# Patient Record
Sex: Female | Born: 1970 | Race: Black or African American | Hispanic: No | Marital: Single | Smoking: Former smoker
Health system: Southern US, Community
[De-identification: ages and names within clinical notes are randomized; demographics above are authoritative.]

## PROBLEM LIST (undated history)

## (undated) DIAGNOSIS — I2510 Atherosclerotic heart disease of native coronary artery without angina pectoris: Secondary | ICD-10-CM

## (undated) DIAGNOSIS — I219 Acute myocardial infarction, unspecified: Secondary | ICD-10-CM

## (undated) DIAGNOSIS — I739 Peripheral vascular disease, unspecified: Secondary | ICD-10-CM

## (undated) DIAGNOSIS — E785 Hyperlipidemia, unspecified: Secondary | ICD-10-CM

## (undated) DIAGNOSIS — E119 Type 2 diabetes mellitus without complications: Secondary | ICD-10-CM

## (undated) DIAGNOSIS — I10 Essential (primary) hypertension: Secondary | ICD-10-CM

## (undated) DIAGNOSIS — Z9114 Patient's other noncompliance with medication regimen: Secondary | ICD-10-CM

## (undated) DIAGNOSIS — Z720 Tobacco use: Secondary | ICD-10-CM

## (undated) DIAGNOSIS — Z91148 Patient's other noncompliance with medication regimen for other reason: Secondary | ICD-10-CM

## (undated) DIAGNOSIS — I779 Disorder of arteries and arterioles, unspecified: Secondary | ICD-10-CM

## (undated) HISTORY — DX: Patient's other noncompliance with medication regimen for other reason: Z91.148

## (undated) HISTORY — DX: Tobacco use: Z72.0

## (undated) HISTORY — PX: ABDOMINAL HYSTERECTOMY: SHX81

## (undated) HISTORY — DX: Patient's other noncompliance with medication regimen: Z91.14

---

## 1997-06-20 ENCOUNTER — Encounter (HOSPITAL_COMMUNITY): Admission: RE | Admit: 1997-06-20 | Discharge: 1997-09-18 | Payer: Self-pay | Admitting: *Deleted

## 1997-07-27 ENCOUNTER — Inpatient Hospital Stay (HOSPITAL_COMMUNITY): Admission: AD | Admit: 1997-07-27 | Discharge: 1997-07-27 | Payer: Self-pay | Admitting: *Deleted

## 1997-10-20 ENCOUNTER — Inpatient Hospital Stay (HOSPITAL_COMMUNITY): Admission: AD | Admit: 1997-10-20 | Discharge: 1997-10-20 | Payer: Self-pay | Admitting: *Deleted

## 1997-10-26 ENCOUNTER — Encounter (HOSPITAL_COMMUNITY): Admission: RE | Admit: 1997-10-26 | Discharge: 1997-11-14 | Payer: Self-pay | Admitting: *Deleted

## 1997-11-15 ENCOUNTER — Inpatient Hospital Stay (HOSPITAL_COMMUNITY): Admission: AD | Admit: 1997-11-15 | Discharge: 1997-11-17 | Payer: Self-pay | Admitting: *Deleted

## 1998-06-25 ENCOUNTER — Ambulatory Visit (HOSPITAL_COMMUNITY): Admission: RE | Admit: 1998-06-25 | Discharge: 1998-06-25 | Payer: Self-pay | Admitting: *Deleted

## 1998-11-15 ENCOUNTER — Emergency Department (HOSPITAL_COMMUNITY): Admission: EM | Admit: 1998-11-15 | Discharge: 1998-11-15 | Payer: Self-pay | Admitting: Emergency Medicine

## 2000-02-04 ENCOUNTER — Emergency Department (HOSPITAL_COMMUNITY): Admission: EM | Admit: 2000-02-04 | Discharge: 2000-02-04 | Payer: Self-pay

## 2000-03-12 ENCOUNTER — Emergency Department (HOSPITAL_COMMUNITY): Admission: EM | Admit: 2000-03-12 | Discharge: 2000-03-12 | Payer: Self-pay | Admitting: Internal Medicine

## 2000-05-18 ENCOUNTER — Emergency Department (HOSPITAL_COMMUNITY): Admission: EM | Admit: 2000-05-18 | Discharge: 2000-05-18 | Payer: Self-pay | Admitting: Internal Medicine

## 2002-02-02 ENCOUNTER — Emergency Department (HOSPITAL_COMMUNITY): Admission: EM | Admit: 2002-02-02 | Discharge: 2002-02-02 | Payer: Self-pay | Admitting: Emergency Medicine

## 2002-02-02 ENCOUNTER — Encounter: Payer: Self-pay | Admitting: Emergency Medicine

## 2002-08-17 ENCOUNTER — Other Ambulatory Visit: Admission: RE | Admit: 2002-08-17 | Discharge: 2002-08-17 | Payer: Self-pay | Admitting: Obstetrics and Gynecology

## 2003-05-11 ENCOUNTER — Emergency Department (HOSPITAL_COMMUNITY): Admission: EM | Admit: 2003-05-11 | Discharge: 2003-05-11 | Payer: Self-pay | Admitting: Emergency Medicine

## 2003-09-18 ENCOUNTER — Emergency Department (HOSPITAL_COMMUNITY): Admission: EM | Admit: 2003-09-18 | Discharge: 2003-09-18 | Payer: Self-pay | Admitting: Emergency Medicine

## 2004-01-20 ENCOUNTER — Emergency Department (HOSPITAL_COMMUNITY): Admission: EM | Admit: 2004-01-20 | Discharge: 2004-01-20 | Payer: Self-pay | Admitting: Emergency Medicine

## 2004-09-22 ENCOUNTER — Ambulatory Visit: Payer: Self-pay | Admitting: Family Medicine

## 2004-10-15 ENCOUNTER — Emergency Department (HOSPITAL_COMMUNITY): Admission: EM | Admit: 2004-10-15 | Discharge: 2004-10-15 | Payer: Self-pay | Admitting: Emergency Medicine

## 2005-03-03 ENCOUNTER — Emergency Department (HOSPITAL_COMMUNITY): Admission: EM | Admit: 2005-03-03 | Discharge: 2005-03-03 | Payer: Self-pay | Admitting: Emergency Medicine

## 2005-03-04 ENCOUNTER — Inpatient Hospital Stay (HOSPITAL_COMMUNITY): Admission: RE | Admit: 2005-03-04 | Discharge: 2005-03-06 | Payer: Self-pay | Admitting: Obstetrics and Gynecology

## 2005-03-04 ENCOUNTER — Encounter (INDEPENDENT_AMBULATORY_CARE_PROVIDER_SITE_OTHER): Payer: Self-pay | Admitting: Specialist

## 2005-05-28 ENCOUNTER — Emergency Department (HOSPITAL_COMMUNITY): Admission: EM | Admit: 2005-05-28 | Discharge: 2005-05-28 | Payer: Self-pay | Admitting: Emergency Medicine

## 2005-05-30 ENCOUNTER — Emergency Department (HOSPITAL_COMMUNITY): Admission: EM | Admit: 2005-05-30 | Discharge: 2005-05-30 | Payer: Self-pay | Admitting: Emergency Medicine

## 2005-06-01 ENCOUNTER — Emergency Department (HOSPITAL_COMMUNITY): Admission: EM | Admit: 2005-06-01 | Discharge: 2005-06-01 | Payer: Self-pay | Admitting: Emergency Medicine

## 2005-06-04 ENCOUNTER — Emergency Department (HOSPITAL_COMMUNITY): Admission: EM | Admit: 2005-06-04 | Discharge: 2005-06-04 | Payer: Self-pay | Admitting: Emergency Medicine

## 2006-02-20 ENCOUNTER — Emergency Department (HOSPITAL_COMMUNITY): Admission: EM | Admit: 2006-02-20 | Discharge: 2006-02-20 | Payer: Self-pay | Admitting: Emergency Medicine

## 2006-05-11 HISTORY — PX: ABDOMINAL HYSTERECTOMY: SHX81

## 2007-11-07 ENCOUNTER — Ambulatory Visit: Payer: Self-pay | Admitting: *Deleted

## 2008-04-19 ENCOUNTER — Emergency Department (HOSPITAL_COMMUNITY): Admission: EM | Admit: 2008-04-19 | Discharge: 2008-04-19 | Payer: Self-pay | Admitting: Emergency Medicine

## 2008-05-22 ENCOUNTER — Emergency Department (HOSPITAL_COMMUNITY): Admission: EM | Admit: 2008-05-22 | Discharge: 2008-05-22 | Payer: Self-pay | Admitting: Emergency Medicine

## 2008-06-26 ENCOUNTER — Ambulatory Visit: Payer: Self-pay | Admitting: Internal Medicine

## 2008-06-26 ENCOUNTER — Encounter (INDEPENDENT_AMBULATORY_CARE_PROVIDER_SITE_OTHER): Payer: Self-pay | Admitting: Adult Health

## 2008-06-26 LAB — CONVERTED CEMR LAB
AST: 14 units/L (ref 0–37)
Albumin: 4.3 g/dL (ref 3.5–5.2)
Alkaline Phosphatase: 71 units/L (ref 39–117)
BUN: 22 mg/dL (ref 6–23)
Basophils Relative: 0 % (ref 0–1)
Creatinine, Ser: 0.77 mg/dL (ref 0.40–1.20)
Eosinophils Absolute: 0.1 10*3/uL (ref 0.0–0.7)
Eosinophils Relative: 2 % (ref 0–5)
Glucose, Bld: 224 mg/dL — ABNORMAL HIGH (ref 70–99)
HCT: 40.7 % (ref 36.0–46.0)
HDL: 36 mg/dL — ABNORMAL LOW (ref >39)
Hemoglobin: 13.9 g/dL (ref 12.0–15.0)
LDL Cholesterol: 86 mg/dL (ref 0–99)
Lymphs Abs: 2.4 10*3/uL (ref 0.7–4.0)
MCHC: 34.2 g/dL (ref 30.0–36.0)
MCV: 87.2 fL (ref 78.0–100.0)
Microalb, Ur: 0.5 mg/dL (ref 0.00–1.89)
Monocytes Absolute: 0.3 10*3/uL (ref 0.1–1.0)
Monocytes Relative: 5 % (ref 3–12)
Neutrophils Relative %: 59 % (ref 43–77)
Potassium: 4.2 meq/L (ref 3.5–5.3)
RBC: 4.67 M/uL (ref 3.87–5.11)
Total Bilirubin: 0.2 mg/dL — ABNORMAL LOW (ref 0.3–1.2)
Total CHOL/HDL Ratio: 5.1
Triglycerides: 307 mg/dL — ABNORMAL HIGH (ref <150)
WBC: 7 10*3/uL (ref 4.0–10.5)

## 2008-09-29 ENCOUNTER — Emergency Department (HOSPITAL_COMMUNITY): Admission: EM | Admit: 2008-09-29 | Discharge: 2008-09-29 | Payer: Self-pay | Admitting: Emergency Medicine

## 2008-10-24 ENCOUNTER — Encounter (INDEPENDENT_AMBULATORY_CARE_PROVIDER_SITE_OTHER): Payer: Self-pay | Admitting: Adult Health

## 2008-10-24 ENCOUNTER — Ambulatory Visit: Payer: Self-pay | Admitting: Family Medicine

## 2009-04-12 ENCOUNTER — Emergency Department (HOSPITAL_COMMUNITY): Admission: EM | Admit: 2009-04-12 | Discharge: 2009-04-12 | Payer: Self-pay | Admitting: Emergency Medicine

## 2009-10-28 ENCOUNTER — Ambulatory Visit: Payer: Self-pay | Admitting: Family Medicine

## 2009-10-28 ENCOUNTER — Encounter (INDEPENDENT_AMBULATORY_CARE_PROVIDER_SITE_OTHER): Payer: Self-pay | Admitting: Adult Health

## 2009-10-28 LAB — CONVERTED CEMR LAB
Albumin: 3.8 g/dL (ref 3.5–5.2)
Alkaline Phosphatase: 47 units/L (ref 39–117)
BUN: 10 mg/dL (ref 6–23)
Calcium: 8.9 mg/dL (ref 8.4–10.5)
Creatinine, Ser: 0.69 mg/dL (ref 0.40–1.20)
Glucose, Bld: 174 mg/dL — ABNORMAL HIGH (ref 70–99)
HDL: 33 mg/dL — ABNORMAL LOW (ref >39)
LDL Cholesterol: 104 mg/dL — ABNORMAL HIGH (ref 0–99)
Potassium: 4.7 meq/L (ref 3.5–5.3)
Total CHOL/HDL Ratio: 5.7
Triglycerides: 256 mg/dL — ABNORMAL HIGH (ref <150)

## 2010-08-09 ENCOUNTER — Emergency Department (HOSPITAL_COMMUNITY): Payer: Self-pay

## 2010-08-09 ENCOUNTER — Emergency Department (HOSPITAL_COMMUNITY)
Admission: EM | Admit: 2010-08-09 | Discharge: 2010-08-09 | Disposition: A | Discharge status: Home / Self Care | Payer: Self-pay | Attending: Emergency Medicine | Admitting: Emergency Medicine

## 2010-08-09 ENCOUNTER — Inpatient Hospital Stay (INDEPENDENT_AMBULATORY_CARE_PROVIDER_SITE_OTHER)
Admission: RE | Admit: 2010-08-09 | Discharge: 2010-08-09 | Disposition: A | Discharge status: Home / Self Care | Payer: Self-pay | Source: Ambulatory Visit | Attending: Emergency Medicine | Admitting: Emergency Medicine

## 2010-08-09 DIAGNOSIS — R072 Precordial pain: Secondary | ICD-10-CM | POA: Insufficient documentation

## 2010-08-09 DIAGNOSIS — I10 Essential (primary) hypertension: Secondary | ICD-10-CM

## 2010-08-09 DIAGNOSIS — Z760 Encounter for issue of repeat prescription: Secondary | ICD-10-CM | POA: Insufficient documentation

## 2010-08-09 DIAGNOSIS — E119 Type 2 diabetes mellitus without complications: Secondary | ICD-10-CM | POA: Insufficient documentation

## 2010-08-09 LAB — POCT CARDIAC MARKERS
CKMB, poc: <1 ng/mL — ABNORMAL LOW (ref 1.0–8.0)
Myoglobin, poc: 40.7 ng/mL (ref 12–200)

## 2010-09-26 NOTE — H&P (Signed)
[name], [name]             ACCOUNT NO.:  [id number]   MEDICAL RECORD NO.:  [id number]           PATIENT TYPE:   LOCATION:                                 FACILITY:   PHYSICIAN:  Malachi Pro. Ambrose Mantle, M.D.      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 Last menstrual period February 04, 2005. The patient states  that her periods were regular and lasting 5 days until approximately 6  months ago when she began having periods every 10 to 14 days. In June she  had 2 periods, July 3 periods. With those menses, she had lots of back and  pelvic pain. She denied dyspareunia. She states that her menstrual pain had  gone from a 3 out of 10 to an 8 out of 10. On examination in August of 2006,  she was noted to have a retroverted uterus, an irregularity in the cul-de-  sac, possibly consistent with endometriosis. She was also noted to be anemia  and was placed on iron. She underwent an endometrial aspiration on January 27, 2005 that showed secretory endometrium. No evidence of hyperplasia or  malignancy. The patient states that the first 3 days of her flow is quite  heavy, passing golf ball size clots. The pain with her periods is somewhat  disabling. She is admitted now for hysterectomy because of presumed  endometriosis.   ALLERGIES:  No known drug allergies.   PAST MEDICAL HISTORY:  Diabetes. No heart problems.   SOCIAL HISTORY:  No alcohol. Cigarettes 1/2 pack a day.   MEDICATIONS:  Glucovance 250 mg 1 tablet twice a day.   PAST SURGICAL HISTORY:  Tubal ligation and neck surgery.   FAMILY HISTORY:  Mother 52 with diabetes. Father 7, died from a heart  attack. The patient's  mother also has a history of high blood pressure, has  renal failure, and is on dialysis. Four sisters and 2 brothers are living  and well.   PHYSICAL EXAMINATION:  GENERAL:  A well-developed, somewhat obese black  female in no distress.  HEENT:  No cranial abnormalities. Extraocular movements intact. Nose and  pharynx clear.  NECK:  Supple without thyromegaly.  HEART:  Normal size and sounds. No murmurs.  LUNGS:  Clear to auscultation.  BREAST:  Soft without masses lying down or sitting up.  ABDOMEN:  Soft, non-tender. No masses are palpable. Liver, spleen, kidneys  are not felt. Pap smear on December 17, 2004 was within normal limits.  PELVIC:  Vulva and vagina are clean. BUS negative. Cervix clean. Uterus mid  plane, somewhat difficult to feel because there seems to be nodularity on  the posterior uterus or in the cul-de-sac. The adnexa are free of masses.  RECTAL:  Examination in September 2006 did not confirm nodularity.   ADMISSION IMPRESSION:  Persistent  abnormal uterine bleeding, severe  dysmenorrhea and pelvic pain, diabetes.   LABORATORY DATA:  The patient's blood sugar was 305 on her pre-op labs.   PLAN:  She is going to be brought into the hospital the night prior to  surgery and placed on a Glucomander for glucose control. She is to undergo  an abdominal hysterectomy. She understands the risks of surgery including  but not limited to heart attack, stroke, pulmonary embolus, wound  disruption, hemorrhage with need for re-operation and/or transfusion,  fistula formation, nerve injury, intestinal obstruction. She understands and  agrees to proceed.      Malachi Pro. Ambrose Mantle, M.D.  Electronically Signed     TFH/MEDQ  D:  03/03/2005  T:  03/03/2005  Job:  [id number]

## 2010-09-26 NOTE — Discharge Summary (Signed)
[name], [name]             ACCOUNT NO.:  [id number]   MEDICAL RECORD NO.:  [id number]          PATIENT TYPE:  INP   LOCATION:  1603                         FACILITY:  [facility]   PHYSICIAN:  Malachi Pro. Ambrose Mantle, M.D. DATE OF BIRTH:  [birth year]   DATE OF ADMISSION:  03/04/2005  DATE OF DISCHARGE:  03/06/2005                                 DISCHARGE SUMMARY   HISTORY:  This is a 40 year old black female admitted to the hospital for  abdominal hysterectomy because of pelvic pain, abnormal bleeding, anemia,  dysmenorrhea and abnormal cul-de-sac on pelvic exam, presumably secondary to  endometriosis.  The patient underwent laparotomy.  There was dense adherence  of the rectum and the left ovary in the cul-de-sac to the posterior aspect  of the lower uterine segment obliterating the cul-de-sac. There was also a  right adnexal cyst.  The procedure was abdominal hysterectomy, mobilization  of the cul-de-sac, left ovarian resection of endometriosis and right adnexal  cystectomy.  Procedure was done by Dr. Ambrose Mantle with Dr. Senaida Ores assisting.  Prior to surgery the patient's preoperative blood sugar was 305 so she was  kept overnight preoperatively and placed on a Glucomander which lowered her  blood sugar to the 100-200 range.  After surgery, she was maintained on the  Glucomander 24 hours, then it was stopped and she was changed to her  preoperative Glucovance with a sliding scale insulin.  Her blood sugars have  been relatively well-controlled.  At the time of discharge, her abdomen was  soft, nontender.  Staples were not removed, and she has not passed flatus.   LABORATORY DATA:  Initial hemoglobin 13.8, hematocrit 41.3, white count 77,  100, platelet count 181,000.  Postoperative hematocrits were 33, 28 and 29.  Differential was normal.  Comprehensive metabolic profile was normal except  for sodium of 133, glucose of 305.  Serum pregnancy test was negative.  Urinalysis showed a pH 1.038,  glucose of greater than 1000 mg/dL.  EKG was  normal.  Pathology report: Right ovary and fallopian tube resection, benign  ovarian serous cystadenoma, benign paratubal cyst no evidence malignancy.  This was actually a mass that was attached to the right ovary by a slender  stalk but a perfectly normal right ovary was left.  The uterus showed benign  cervix with mild chronic cervicitis, benign secretory phase endometrium.  No  hyperplasia, adenomyosis, serosal endometriosis and endosalpingiosis  associated with fibrous adhesions.  Resection of left partial part of the  left ovary showed endometriosis.   FINAL DIAGNOSES:  1.  Abnormal uterine bleeding.  2.  Pelvic pain.  3.  Anemia.  4.  Diabetes.  5.  Dysmenorrhea.  6.  Endometriosis of the left ovary.  7.  Endometriosis of the uterine serosa.  8.  Adenomyosis.  9.  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   OPERATION:  Abdominal hysterectomy, removal of right adnexal cyst, partial  resection of the left ovary.   FINAL CONDITION:  Improved.   DISCHARGE INSTRUCTIONS:  Instructions include our regular discharge  instructions.  The patient is advised to stay on liquids until passing  flatus.  Call with any unusual problems.  Report any temperature greater  than 100.4  degrees.  Call with heavy bleeding.  Return to the office in 3-4 days for  followup examination and removal of her staples and call Fannie Knee Drinkard of  [facility] to see if some fine tuning of her diabetes, blood sugar control  is in order.  Percocet 5/325 mg #24 tablets one every 4-6 hours as needed  for pain is given at discharge.      Malachi Pro. Ambrose Mantle, M.D.  Electronically Signed     TFH/MEDQ  D:  03/06/2005  T:  03/06/2005  Job:  [id number]   cc:   Fannie Knee Drinkard  [facility]  [place], [place]

## 2010-09-26 NOTE — Op Note (Signed)
[name], [name]             ACCOUNT NO.:  [id number]   MEDICAL RECORD NO.:  [id number]          PATIENT TYPE:  INP   LOCATION:  1603                         FACILITY:  [facility]   PHYSICIAN:  Malachi Pro. Ambrose Mantle, M.D. DATE OF BIRTH:  06/19/1970   DATE OF PROCEDURE:  03/04/2005  DATE OF DISCHARGE:                                 OPERATIVE REPORT   PREOPERATIVE DIAGNOSES:  Pelvic pain, severe dysmenorrhea, abnormal uterine  bleeding, menorrhagia, abnormal cul-de-sac.   POSTOPERATIVE DIAGNOSES:  Pelvic pain, severe dysmenorrhea, abnormal uterine  bleeding, menorrhagia, abnormal cul-de-sac. Obliteration of the cul-de-sac  by dense adherence of the rectum and the left ovary to the posterior lower  uterine segment, right adnexal cyst, presumed left ovarian endometriosis.   OPERATION:  Laparotomy, abdominal hysterectomy, mobilization of the cul-de-  sac, partial resection of the left ovary, removal of a right adnexal cyst.   OPERATOR:  Malachi Pro. Ambrose Mantle, M.D.   ASSISTANT:  Huel Cote, M.D.   ANESTHESIA:  General anesthesia.   The patient was brought to the operating room and placed under satisfactory  general anesthesia then placed in the frog-leg position. The abdomen, vulva,  vagina and urethra were prepped with Betadine solution, a Foley catheter was  inserted to straight drain. The patient had been kept in the hospital  overnight to normalize her blood sugar and the last blood sugar prior to  surgery was 175. The abdomen was draped as a sterile field. The patient was  placed supine and the abdomen was entered by a transverse incision  suprapubically, carried in layers through the skin, subcutaneous tissue and  fascia. The incision was made somewhat high on the lower abdomen to avoid an  overlap of the tissues that would hide the incision. I entered the  peritoneal cavity, explored the upper abdomen, felt the liver to be smooth.  The gallbladder was felt smooth and cystic,  both kidneys felt normal. No  upper abdominal abnormalities were noted. The appendix looked normal.  Exploration of the pelvis revealed the uterus to be anterior, normal size.  The cul-de-sac was obliterated. It was obliterated by a combination of  adhesions, it was somewhat filmy plus very dense adherence of the left ovary  and the rectum to the posterior lower uterine segment. The right tube and  ovary appeared normal except for previous tubal ligation but there was a  cystic mass approximately 4-5 cm in diameter connected to the right ovary by  a white band of tissue approximately 1 inch long and 4 or 5 mm in diameter.  Packs and retractors were used for exposure. The uterus was elevated as much  as could be by clamping across the upper pedicles. I did as much dissection  posteriorly with a combination of blunt and sharp dissection as possible  initially, but still could not free the ovary and the rectum from the  adherence to the posterior lower uterine segment. I divided both round  ligaments with the electrical current and developed a bladder flap. I then  divided both upper pedicles between clamps and doubly suture ligated the  upper pedicles. I skeletonize  the uterine vessels, clamped, cut and suture  ligated them and then we redirected my attention to the posterior  abnormality. At this point, I was able to free the left ovary and the rectum  from the posterior lower uterine segment and felt that there must be  endometriosis in the left ovary to explain this quite abnormal anatomical  findings. We progressively clamped, cut and suture ligated the parametrial  and paracervical tissues, held the uterosacral ligaments and then entered  the vagina and removed the uterus by transecting the upper vagina. Vaginal  angled sutures were placed and then the central portion of the vagina was  closed with interrupted figure-of-eight sutures of #0 Vicryl. A couple of  extra sutures were required  for complete hemostasis as well as minor touches  with the electrical current. Liberal irrigation confirmed hemostasis. I then  directed my attention to the left ovary, inspected the area that had been  densely adherent to the posterior lower uterine segment and the rectum and  felt that this felt somewhat hard. I then removed this area with a knife  blade and preserved it as a pathological specimen. I reapproximated the left  ovary with interrupted figure-of-eight sutures of 4-0 Vicryl. Prior to this,  I had removed the right adnexal cyst and had sent it for frozen section, it  was returned as either a serous cyst of the ovary, paratubal cyst or a  paraovarian cyst, but in any case benign. I identified both ureters, they  were normal in size and contour. I sutured the uterosacral ligaments  together in the midline, checked thoroughly for hemostasis, liberally  irrigated the pelvis and then removed all packs and retractors and closed  the abdominal wall with a running suture of #0 Vicryl and the peritoneum  with two running sutures of #0 Vicryl in the fascia, a running 3-0 Vicryl in  the subcu tissue and staples on skin. The patient seemed to tolerate the  procedure well. Blood loss was estimated at 300 mL. Sponge and needle counts  were correct and she was returned to recovery in satisfactory condition.      Malachi Pro. Ambrose Mantle, M.D.  Electronically Signed     TFH/MEDQ  D:  03/04/2005  T:  03/04/2005  Job:  [id number]

## 2010-12-31 ENCOUNTER — Inpatient Hospital Stay (INDEPENDENT_AMBULATORY_CARE_PROVIDER_SITE_OTHER): Admit: 2010-12-31 | Discharge: 2010-12-31 | Disposition: A | Discharge status: Home / Self Care | Payer: Self-pay

## 2010-12-31 ENCOUNTER — Emergency Department (HOSPITAL_COMMUNITY)
Admission: EM | Admit: 2010-12-31 | Discharge: 2010-12-31 | Discharge status: Against Medical Advice | Payer: Self-pay | Attending: Emergency Medicine | Admitting: Emergency Medicine

## 2010-12-31 DIAGNOSIS — X19XXXA Contact with other heat and hot substances, initial encounter: Secondary | ICD-10-CM | POA: Insufficient documentation

## 2010-12-31 DIAGNOSIS — T2200XA Burn of unspecified degree of shoulder and upper limb, except wrist and hand, unspecified site, initial encounter: Secondary | ICD-10-CM | POA: Insufficient documentation

## 2010-12-31 DIAGNOSIS — T22019A Burn of unspecified degree of unspecified forearm, initial encounter: Secondary | ICD-10-CM

## 2012-10-19 ENCOUNTER — Emergency Department (HOSPITAL_COMMUNITY)
Admission: EM | Admit: 2012-10-19 | Discharge: 2012-10-19 | Disposition: A | Discharge status: Home / Self Care | Payer: Medicaid Other | Source: Home / Self Care

## 2012-10-19 ENCOUNTER — Encounter (HOSPITAL_COMMUNITY): Payer: Self-pay | Admitting: Emergency Medicine

## 2012-10-19 DIAGNOSIS — I10 Essential (primary) hypertension: Secondary | ICD-10-CM

## 2012-10-19 HISTORY — DX: Type 2 diabetes mellitus without complications: E11.9

## 2012-10-19 HISTORY — DX: Essential (primary) hypertension: I10

## 2012-10-19 MED ORDER — CLONIDINE HCL 0.1 MG PO TABS
ORAL_TABLET | ORAL | Status: AC
  Filled 2012-10-19: qty 1

## 2012-10-19 MED ORDER — CLONIDINE HCL 0.1 MG PO TABS
0.1000 mg | ORAL_TABLET | Freq: Once | ORAL | Status: AC
  Administered 2012-10-19: 0.1 mg via ORAL

## 2012-10-19 MED ORDER — LISINOPRIL 20 MG PO TABS: 20.0000 mg | ORAL_TABLET | Freq: Every day | ORAL | Status: DC

## 2012-10-19 NOTE — ED Notes (Signed)
Pt c/o HTN... Reports she was Rx meds but has not taken any over 6 months... BP today is 161/103 Sxs today include: mild headache Denies: SOB, CP, blurry vision, edema Does not remember the name of BP med She is alert and oriented w/no signs of acute distress.

## 2012-10-19 NOTE — ED Provider Notes (Addendum)
History     CSN: [id number]  Arrival date & time 10/19/12  1020   First MD Initiated Contact with Patient 10/19/12 1046      Chief Complaint  Patient presents with  . Hypertension    (Consider location/radiation/quality/duration/timing/severity/associated sxs/prior treatment) Patient is a 42 y.o. female presenting with hypertension.  Hypertension Associated symptoms include headaches.   This is a 42 year old female with a past medical history of hypertension and diabetes. 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 She states she follows up with her family doctor but the last time she was there she was not told that her blood pressure was elevated and therefore did not receive any medications for it. She states that in the past she has been on lisinopril. At this time she complains of mild headache some occasional blurred vision but overall no significant symptoms from her elevated blood pressure. Past Medical History  Diagnosis Date  . Hypertension   . Diabetes mellitus without complication     History reviewed. No pertinent past surgical history.  No family history on file.  History  Substance Use Topics  . Smoking status: Current Every Day Smoker -- 0.50 packs/day    Types: Cigarettes  . Smokeless tobacco: Not on file  . Alcohol Use: No    OB History   Grav Para Term Preterm Abortions TAB SAB Ect Mult Living                  Review of Systems  Constitutional: Negative.   Eyes: Negative.   Respiratory: Negative.   Cardiovascular: Negative.   Gastrointestinal: Negative.   Genitourinary: Negative.   Musculoskeletal: Negative.   Skin: Negative.   Neurological: Positive for headaches.  Hematological: Negative.   Psychiatric/Behavioral: Negative.     Allergies  Review of patient's allergies indicates no known allergies.  Home Medications   Current Outpatient Rx  Name  Route  Sig  Dispense  Refill  . metFORMIN  (GLUCOPHAGE) 500 MG tablet   Oral   Take 500 mg by mouth 2 (two) times daily with a meal.         . lisinopril (PRINIVIL,ZESTRIL) 20 MG tablet   Oral   Take 1 tablet (20 mg total) by mouth daily.   30 tablet   3     BP 161/103  Pulse 98  Temp(Src) 98.3 F (36.8 C) (Oral)  Resp 16  SpO2 100%  Physical Exam  ED Course  Procedures (including critical care time)  Labs Reviewed - No data to display No results found.   1. HTN (hypertension)       MDM  I have given her a one-time dose of clonidine 0.1 mg.  I have resumed lisinopril 20 mg daily. She is advised to followup with her family doctor in one week. I have also strongly advised her to lose weight.        Calvert Cantor, MD 10/19/12 1109  Calvert Cantor, MD 10/19/12 1110

## 2014-03-11 ENCOUNTER — Emergency Department (INDEPENDENT_AMBULATORY_CARE_PROVIDER_SITE_OTHER)
Admission: EM | Admit: 2014-03-11 | Discharge: 2014-03-11 | Disposition: A | Discharge status: Home / Self Care | Payer: Self-pay | Source: Home / Self Care | Attending: Emergency Medicine | Admitting: Emergency Medicine

## 2014-03-11 ENCOUNTER — Encounter (HOSPITAL_COMMUNITY): Payer: Self-pay

## 2014-03-11 DIAGNOSIS — E1165 Type 2 diabetes mellitus with hyperglycemia: Secondary | ICD-10-CM

## 2014-03-11 DIAGNOSIS — K5900 Constipation, unspecified: Secondary | ICD-10-CM

## 2014-03-11 DIAGNOSIS — R81 Glycosuria: Secondary | ICD-10-CM

## 2014-03-11 LAB — POCT URINALYSIS DIP (DEVICE)
GLUCOSE, UA: 500 mg/dL — AB
Hgb urine dipstick: NEGATIVE
LEUKOCYTES UA: NEGATIVE
NITRITE: NEGATIVE
PROTEIN: NEGATIVE mg/dL
Specific Gravity, Urine: 1.02 (ref 1.005–1.030)
UROBILINOGEN UA: 0.2 mg/dL (ref 0.0–1.0)
pH: 5 (ref 5.0–8.0)

## 2014-03-11 MED ORDER — LISINOPRIL 10 MG PO TABS: 10.0000 mg | ORAL_TABLET | Freq: Every day | ORAL | Status: DC

## 2014-03-11 MED ORDER — METFORMIN HCL 500 MG PO TABS: 500.0000 mg | ORAL_TABLET | Freq: Two times a day (BID) | ORAL | Status: DC

## 2014-03-11 MED ORDER — POLYETHYLENE GLYCOL 3350 17 G PO PACK: 17.0000 g | PACK | Freq: Every day | ORAL | Status: DC

## 2014-03-11 NOTE — Discharge Instructions (Signed)
Your labs were ok. Blood sugar 167. Please resume taking your medications as prescribed and work toward locating another primary care physician. If symptoms become suddenly worse or severe, report to your nearest ER for assistance. Plenty of fluids over the next several days. At least 10, 8oz glasses of water each day Constipation Constipation is when a person has fewer than three bowel movements a week, has difficulty having a bowel movement, or has stools that are dry, hard, or larger than normal. As people grow older, constipation is more common. If you try to fix constipation with medicines that make you have a bowel movement (laxatives), the problem may get worse. Long-term laxative use may cause the muscles of the colon to become weak. A low-fiber diet, not taking in enough fluids, and taking certain medicines may make constipation worse.  CAUSES   Certain medicines, such as antidepressants, pain medicine, iron supplements, antacids, and water pills.   Certain diseases, such as diabetes, irritable bowel syndrome (IBS), thyroid disease, or depression.   Not drinking enough water.   Not eating enough fiber-rich foods.   Stress or travel.   Lack of physical activity or exercise.   Ignoring the urge to have a bowel movement.   Using laxatives too much.  SIGNS AND SYMPTOMS   Having fewer than three bowel movements a week.   Straining to have a bowel movement.   Having stools that are hard, dry, or larger than normal.   Feeling full or bloated.   Pain in the lower abdomen.   Not feeling relief after having a bowel movement.  DIAGNOSIS  Your health care provider will take a medical history and perform a physical exam. Further testing may be done for severe constipation. Some tests may include:  A barium enema X-ray to examine your rectum, colon, and, sometimes, your small intestine.   A sigmoidoscopy to examine your lower colon.   A colonoscopy to examine your  entire colon. TREATMENT  Treatment will depend on the severity of your constipation and what is causing it. Some dietary treatments include drinking more fluids and eating more fiber-rich foods. Lifestyle treatments may include regular exercise. If these diet and lifestyle recommendations do not help, your health care provider may recommend taking over-the-counter laxative medicines to help you have bowel movements. Prescription medicines may be prescribed if over-the-counter medicines do not work.  HOME CARE INSTRUCTIONS   Eat foods that have a lot of fiber, such as fruits, vegetables, whole grains, and beans.  Limit foods high in fat and processed sugars, such as french fries, hamburgers, cookies, candies, and soda.   A fiber supplement may be added to your diet if you cannot get enough fiber from foods.   Drink enough fluids to keep your urine clear or pale yellow.   Exercise regularly or as directed by your health care provider.   Go to the restroom when you have the urge to go. Do not hold it.   Only take over-the-counter or prescription medicines as directed by your health care provider. Do not take other medicines for constipation without talking to your health care provider first.  SEEK IMMEDIATE MEDICAL CARE IF:   You have bright red blood in your stool.   Your constipation lasts for more than 4 days or gets worse.   You have abdominal or rectal pain.   You have thin, pencil-like stools.   You have unexplained weight loss. MAKE SURE YOU:   Understand these instructions.  Will watch your  condition.  Will get help right away if you are not doing well or get worse. Document Released: 01/24/2004 Document Revised: 05/02/2013 Document Reviewed: 02/06/2013 [facility] Patient Information 2015 [place], [place]. This information is not intended to replace advice given to you by your health care provider. Make sure you discuss any questions you have with your health care  provider.  Diabetes Mellitus and Food It is important for you to manage your blood sugar (glucose) level. Your blood glucose level can be greatly affected by what you eat. Eating healthier foods in the appropriate amounts throughout the day at about the same time each day will help you control your blood glucose level. It can also help slow or prevent worsening of your diabetes mellitus. Healthy eating may even help you improve the level of your blood pressure and reach or maintain a healthy weight.  HOW CAN FOOD AFFECT ME? Carbohydrates Carbohydrates affect your blood glucose level more than any other type of food. Your dietitian will help you determine how many carbohydrates to eat at each meal and teach you how to count carbohydrates. Counting carbohydrates is important to keep your blood glucose at a healthy level, especially if you are using insulin or taking certain medicines for diabetes mellitus. Alcohol Alcohol can cause sudden decreases in blood glucose (hypoglycemia), especially if you use insulin or take certain medicines for diabetes mellitus. Hypoglycemia can be a life-threatening condition. Symptoms of hypoglycemia (sleepiness, dizziness, and disorientation) are similar to symptoms of having too much alcohol.  If your health care provider has given you approval to drink alcohol, do so in moderation and use the following guidelines:  Women should not have more than one drink per day, and men should not have more than two drinks per day. One drink is equal to:  12 oz of beer.  5 oz of wine.  1 oz of hard liquor.  Do not drink on an empty stomach.  Keep yourself hydrated. Have water, diet soda, or unsweetened iced tea.  Regular soda, juice, and other mixers might contain a lot of carbohydrates and should be counted. WHAT FOODS ARE NOT RECOMMENDED? As you make food choices, it is important to remember that all foods are not the same. Some foods have fewer nutrients per serving  than other foods, even though they might have the same number of calories or carbohydrates. It is difficult to get your body what it needs when you eat foods with fewer nutrients. Examples of foods that you should avoid that are high in calories and carbohydrates but low in nutrients include:  Trans fats (most processed foods list trans fats on the Nutrition Facts label).  Regular soda.  Juice.  Candy.  Sweets, such as cake, pie, doughnuts, and cookies.  Fried foods. WHAT FOODS CAN I EAT? Have nutrient-rich foods, which will nourish your body and keep you healthy. The food you should eat also will depend on several factors, including:  The calories you need.  The medicines you take.  Your weight.  Your blood glucose level.  Your blood pressure level.  Your cholesterol level. You also should eat a variety of foods, including:  Protein, such as meat, poultry, fish, tofu, nuts, and seeds (lean animal proteins are best).  Fruits.  Vegetables.  Dairy products, such as milk, cheese, and yogurt (low fat is best).  Breads, grains, pasta, cereal, rice, and beans.  Fats such as olive oil, trans fat-free margarine, canola oil, avocado, and olives. DOES EVERYONE WITH DIABETES MELLITUS HAVE  THE SAME MEAL PLAN? Because every person with diabetes mellitus is different, there is not one meal plan that works for everyone. It is very important that you meet with a dietitian who will help you create a meal plan that is just right for you. Document Released: 01/22/2005 Document Revised: 05/02/2013 Document Reviewed: 03/24/2013 [facility] Patient Information 2015 [place]. This information is not intended to replace advice given to you by your health care provider. Make sure you discuss any questions you have with your health care provider.  Hyperglycemia Hyperglycemia occurs when the glucose (sugar) in your blood is too high. Hyperglycemia can happen for many reasons, but it most often  happens to people who do not know they have diabetes or are not managing their diabetes properly.  CAUSES  Whether you have diabetes or not, there are other causes of hyperglycemia. Hyperglycemia can occur when you have diabetes, but it can also occur in other situations that you might not be as aware of, such as: Diabetes  If you have diabetes and are having problems controlling your blood glucose, hyperglycemia could occur because of some of the following reasons:  Not following your meal plan.  Not taking your diabetes medications or not taking it properly.  Exercising less or doing less activity than you normally do.  Being sick. Pre-diabetes  This cannot be ignored. Before people develop Type 2 diabetes, they almost always have "pre-diabetes." This is when your blood glucose levels are higher than normal, but not yet high enough to be diagnosed as diabetes. Research has shown that some long-term damage to the body, especially the heart and circulatory system, may already be occurring during pre-diabetes. If you take action to manage your blood glucose when you have pre-diabetes, you may delay or prevent Type 2 diabetes from developing. Stress  If you have diabetes, you may be "diet" controlled or on oral medications or insulin to control your diabetes. However, you may find that your blood glucose is higher than usual in the hospital whether you have diabetes or not. This is often referred to as "stress hyperglycemia." Stress can elevate your blood glucose. This happens because of hormones put out by the body during times of stress. If stress has been the cause of your high blood glucose, it can be followed regularly by your caregiver. That way he/she can make sure your hyperglycemia does not continue to get worse or progress to diabetes. Steroids  Steroids are medications that act on the infection fighting system (immune system) to block inflammation or infection. One side effect can be a  rise in blood glucose. Most people can produce enough extra insulin to allow for this rise, but for those who cannot, steroids make blood glucose levels go even higher. It is not unusual for steroid treatments to "uncover" diabetes that is developing. It is not always possible to determine if the hyperglycemia will go away after the steroids are stopped. A special blood test called an A1c is sometimes done to determine if your blood glucose was elevated before the steroids were started. SYMPTOMS  Thirsty.  Frequent urination.  Dry mouth.  Blurred vision.  Tired or fatigue.  Weakness.  Sleepy.  Tingling in feet or leg. DIAGNOSIS  Diagnosis is made by monitoring blood glucose in one or all of the following ways:  A1c test. This is a chemical found in your blood.  Fingerstick blood glucose monitoring.  Laboratory results. TREATMENT  First, knowing the cause of the hyperglycemia is important before  the hyperglycemia can be treated. Treatment may include, but is not be limited to:  Education.  Change or adjustment in medications.  Change or adjustment in meal plan.  Treatment for an illness, infection, etc.  More frequent blood glucose monitoring.  Change in exercise plan.  Decreasing or stopping steroids.  Lifestyle changes. HOME CARE INSTRUCTIONS   Test your blood glucose as directed.  Exercise regularly. Your caregiver will give you instructions about exercise. Pre-diabetes or diabetes which comes on with stress is helped by exercising.  Eat wholesome, balanced meals. Eat often and at regular, fixed times. Your caregiver or nutritionist will give you a meal plan to guide your sugar intake.  Being at an ideal weight is important. If needed, losing as little as 10 to 15 pounds may help improve blood glucose levels. SEEK MEDICAL CARE IF:   You have questions about medicine, activity, or diet.  You continue to have symptoms (problems such as increased thirst,  urination, or weight gain). SEEK IMMEDIATE MEDICAL CARE IF:   You are vomiting or have diarrhea.  Your breath smells fruity.  You are breathing faster or slower.  You are very sleepy or incoherent.  You have numbness, tingling, or pain in your feet or hands.  You have chest pain.  Your symptoms get worse even though you have been following your caregiver's orders.  If you have any other questions or concerns. Document Released: 10/21/2000 Document Revised: 07/20/2011 Document Reviewed: 08/24/2011 [facility] Patient Information 2015 [place], [place]. This information is not intended to replace advice given to you by your health care provider. Make sure you discuss any questions you have with your health care provider.  Type 2 Diabetes Mellitus Type 2 diabetes mellitus, often simply referred to as type 2 diabetes, is a long-lasting (chronic) disease. In type 2 diabetes, the pancreas does not make enough insulin (a hormone), the cells are less responsive to the insulin that is made (insulin resistance), or both. Normally, insulin moves sugars from food into the tissue cells. The tissue cells use the sugars for energy. The lack of insulin or the lack of normal response to insulin causes excess sugars to build up in the blood instead of going into the tissue cells. As a result, high blood sugar (hyperglycemia) develops. The effect of high sugar (glucose) levels can cause many complications. Type 2 diabetes was also previously called adult-onset diabetes, but it can occur at any age.  RISK FACTORS  A person is predisposed to developing type 2 diabetes if someone in the family has the disease and also has one or more of the following primary risk factors:  Overweight.  An inactive lifestyle.  A history of consistently eating high-calorie foods. Maintaining a normal weight and regular physical activity can reduce the chance of developing type 2 diabetes. SYMPTOMS  A person with type 2  diabetes may not show symptoms initially. The symptoms of type 2 diabetes appear slowly. The symptoms include:  Increased thirst (polydipsia).  Increased urination (polyuria).  Increased urination during the night (nocturia).  Weight loss. This weight loss may be rapid.  Frequent, recurring infections.  Tiredness (fatigue).  Weakness.  Vision changes, such as blurred vision.  Fruity smell to your breath.  Abdominal pain.  Nausea or vomiting.  Cuts or bruises which are slow to heal.  Tingling or numbness in the hands or feet. DIAGNOSIS Type 2 diabetes is frequently not diagnosed until complications of diabetes are present. Type 2 diabetes is diagnosed when symptoms or complications  are present and when blood glucose levels are increased. Your blood glucose level may be checked by one or more of the following blood tests:  A fasting blood glucose test. You will not be allowed to eat for at least 8 hours before a blood sample is taken.  A random blood glucose test. Your blood glucose is checked at any time of the day regardless of when you ate.  A hemoglobin A1c blood glucose test. A hemoglobin A1c test provides information about blood glucose control over the previous 3 months.  An oral glucose tolerance test (OGTT). Your blood glucose is measured after you have not eaten (fasted) for 2 hours and then after you drink a glucose-containing beverage. TREATMENT   You may need to take insulin or diabetes medicine daily to keep blood glucose levels in the desired range.  If you use insulin, you may need to adjust the dosage depending on the carbohydrates that you eat with each meal or snack. The treatment goal is to maintain the before meal blood sugar (preprandial glucose) level at 70-130 mg/dL. HOME CARE INSTRUCTIONS   Have your hemoglobin A1c level checked twice a year.  Perform daily blood glucose monitoring as directed by your health care provider.  Monitor urine ketones  when you are ill and as directed by your health care provider.  Take your diabetes medicine or insulin as directed by your health care provider to maintain your blood glucose levels in the desired range.  Never run out of diabetes medicine or insulin. It is needed every day.  If you are using insulin, you may need to adjust the amount of insulin given based on your intake of carbohydrates. Carbohydrates can raise blood glucose levels but need to be included in your diet. Carbohydrates provide vitamins, minerals, and fiber which are an essential part of a healthy diet. Carbohydrates are found in fruits, vegetables, whole grains, dairy products, legumes, and foods containing added sugars.  Eat healthy foods. You should make an appointment to see a registered dietitian to help you create an eating plan that is right for you.  Lose weight if you are overweight.  Carry a medical alert card or wear your medical alert jewelry.  Carry a 15-gram carbohydrate snack with you at all times to treat low blood glucose (hypoglycemia). Some examples of 15-gram carbohydrate snacks include:  Glucose tablets, 3 or 4.  Glucose gel, 15-gram tube.  Raisins, 2 tablespoons (24 grams).  Jelly beans, 6.  Animal crackers, 8.  Regular pop, 4 ounces (120 mL).  Gummy treats, 9.  Recognize hypoglycemia. Hypoglycemia occurs with blood glucose levels of 70 mg/dL and below. The risk for hypoglycemia increases when fasting or skipping meals, during or after intense exercise, and during sleep. Hypoglycemia symptoms can include:  Tremors or shakes.  Decreased ability to concentrate.  Sweating.  Increased heart rate.  Headache.  Dry mouth.  Hunger.  Irritability.  Anxiety.  Restless sleep.  Altered speech or coordination.  Confusion.  Treat hypoglycemia promptly. If you are alert and able to safely swallow, follow the 15:15 rule:  Take 15-20 grams of rapid-acting glucose or carbohydrate.  Rapid-acting options include glucose gel, glucose tablets, or 4 ounces (120 mL) of fruit juice, regular soda, or low-fat milk.  Check your blood glucose level 15 minutes after taking the glucose.  Take 15-20 grams more of glucose if the repeat blood glucose level is still 70 mg/dL or below.  Eat a meal or snack within 1 hour once blood  glucose levels return to normal.  Be alert to feeling very thirsty and urinating more frequently than usual, which are early signs of hyperglycemia. An early awareness of hyperglycemia allows for prompt treatment. Treat hyperglycemia as directed by your health care provider.  Engage in at least 150 minutes of moderate-intensity physical activity a week, spread over at least 3 days of the week or as directed by your health care provider. In addition, you should engage in resistance exercise at least 2 times a week or as directed by your health care provider. Try to spend no more than 90 minutes at one time inactive.  Adjust your medicine and food intake as needed if you start a new exercise or sport.  Follow your sick-day plan anytime you are unable to eat or drink as usual.  Do not use any tobacco products including cigarettes, chewing tobacco, or electronic cigarettes. If you need help quitting, ask your health care provider.  Limit alcohol intake to no more than 1 drink per day for nonpregnant women and 2 drinks per day for men. You should drink alcohol only when you are also eating food. Talk with your health care provider whether alcohol is safe for you. Tell your health care provider if you drink alcohol several times a week.  Keep all follow-up visits as directed by your health care provider. This is important.  Schedule an eye exam soon after the diagnosis of type 2 diabetes and then annually.  Perform daily skin and foot care. Examine your skin and feet daily for cuts, bruises, redness, nail problems, bleeding, blisters, or sores. A foot exam by a  health care provider should be done annually.  Brush your teeth and gums at least twice a day and floss at least once a day. Follow up with your dentist regularly.  Share your diabetes management plan with your workplace or school.  Stay up-to-date with immunizations. It is recommended that people with diabetes who are over 30 years old get the pneumonia vaccine. In some cases, two separate shots may be given. Ask your health care provider if your pneumonia vaccination is up-to-date.  Learn to manage stress.  Obtain ongoing diabetes education and support as needed.  Participate in or seek rehabilitation as needed to maintain or improve independence and quality of life. Request a physical or occupational therapy referral if you are having foot or hand numbness, or difficulties with grooming, dressing, eating, or physical activity. SEEK MEDICAL CARE IF:   You are unable to eat food or drink fluids for more than 6 hours.  You have nausea and vomiting for more than 6 hours.  Your blood glucose level is over 240 mg/dL.  There is a change in mental status.  You develop an additional serious illness.  You have diarrhea for more than 6 hours.  You have been sick or have had a fever for a couple of days and are not getting better.  You have pain during any physical activity.  SEEK IMMEDIATE MEDICAL CARE IF:  You have difficulty breathing.  You have moderate to large ketone levels. MAKE SURE YOU:  Understand these instructions.  Will watch your condition.  Will get help right away if you are not doing well or get worse. Document Released: 04/27/2005 Document Revised: 09/11/2013 Document Reviewed: 11/24/2011 [facility] Patient Information 2015 [place], [place]. This information is not intended to replace advice given to you by your health care provider. Make sure you discuss any questions you have with your health care  provider.

## 2014-03-11 NOTE — ED Notes (Signed)
C/o constipation, out of medications for DM

## 2014-03-11 NOTE — ED Provider Notes (Signed)
CSN: [id number]     Arrival date & time 03/11/14  1303 History   None    Chief Complaint  Patient presents with  . Abdominal Pain   (Consider location/radiation/quality/duration/timing/severity/associated sxs/prior Treatment) HPI Comments: States she feels she is constipated. Last BM was last night and she describes it as small and hard and hard to pass.  Also wishes to mention that she ran out of her lisinopril and metformin two weeks ago. This has resulted in elevated CBGs at home with mild to moderate polyuria and polydipsia.    Patient is a 43 y.o. female presenting with abdominal pain.  Abdominal Pain Pain location:  Generalized Pain quality: cramping   Pain radiates to:  Does not radiate Pain severity:  Mild Onset quality:  Gradual Duration:  1 week Timing:  Intermittent Progression:  Waxing and waning Chronicity:  New Associated symptoms: constipation   Associated symptoms: no anorexia, no chest pain, no diarrhea, no dysuria, no hematuria, no melena, no nausea, no vaginal bleeding, no vaginal discharge and no vomiting     Past Medical History  Diagnosis Date  . Hypertension   . Diabetes mellitus without complication    History reviewed. No pertinent past surgical history. History reviewed. No pertinent family history. History  Substance Use Topics  . Smoking status: Current Every Day Smoker -- 0.50 packs/day    Types: Cigarettes  . Smokeless tobacco: Not on file  . Alcohol Use: No   OB History    No data available     Review of Systems  Constitutional: Negative.   HENT: Negative.   Respiratory: Negative.   Cardiovascular: Negative for chest pain.  Gastrointestinal: Positive for abdominal pain and constipation. Negative for nausea, vomiting, diarrhea, blood in stool, melena and anorexia.  Endocrine: Positive for polydipsia and polyuria.  Genitourinary: Positive for frequency. Negative for dysuria, urgency, hematuria, flank pain, vaginal bleeding, vaginal  discharge, difficulty urinating, vaginal pain and pelvic pain.  Musculoskeletal: Negative for back pain.  Skin: Negative.   All other systems reviewed and are negative.   Allergies  Review of patient's allergies indicates no known allergies.  Home Medications   Prior to Admission medications   Medication Sig Start Date End Date Taking? Authorizing Provider  lisinopril (PRINIVIL,ZESTRIL) 20 MG tablet Take 1 tablet (20 mg total) by mouth daily. 10/19/12   Calvert CantorSaima Rizwan, MD  metFORMIN (GLUCOPHAGE) 500 MG tablet Take 500 mg by mouth 2 (two) times daily with a meal.    Historical Provider, MD   BP 120/82 mmHg  Pulse 98  Temp(Src) 98.7 F (37.1 C) (Oral)  Resp 18  SpO2 98% Physical Exam  Constitutional: She is oriented to person, place, and time. She appears well-developed and well-nourished. No distress.  HENT:  Head: Normocephalic and atraumatic.  Mouth/Throat: Oropharynx is clear and moist.  Eyes: Conjunctivae are normal. No scleral icterus.  Neck: Normal range of motion. Neck supple.  Cardiovascular: Normal rate, regular rhythm and normal heart sounds.   Pulmonary/Chest: Effort normal and breath sounds normal.  Abdominal: Soft. Normal appearance and bowel sounds are normal. She exhibits no mass. There is no tenderness. There is no CVA tenderness.  Musculoskeletal: Normal range of motion.  Neurological: She is alert and oriented to person, place, and time.  Skin: Skin is warm and dry.  Psychiatric: She has a normal mood and affect. Her behavior is normal.  Nursing note and vitals reviewed.   ED Course  Procedures (including critical care time) Labs Review Labs Reviewed  POCT  URINALYSIS DIP (DEVICE) - Abnormal; Notable for the following:    Glucose, UA 500 (*)    Bilirubin Urine SMALL (*)    Ketones, ur TRACE (*)    All other components within normal limits    Imaging Review No results found.   MDM   1. Constipation, unspecified constipation type   2. Hyperglycemia  due to type 2 diabetes mellitus   3. Glucosuria   Resume taking lisinopril and metformin as prescribed Plenty of fluids daily Miralax as directed for constipation Check blood glucose BID Locate PCP for follow up and continued management   Ria ClockJennifer Lee H Presson, PA 03/11/14 1506

## 2014-03-12 LAB — POCT I-STAT, CHEM 8
BUN: 12 mg/dL (ref 6–23)
CREATININE: 0.7 mg/dL (ref 0.50–1.10)
Calcium, Ion: 1.21 mmol/L (ref 1.12–1.23)
Chloride: 101 mEq/L (ref 96–112)
Glucose, Bld: 165 mg/dL — ABNORMAL HIGH (ref 70–99)
HEMATOCRIT: 48 % — AB (ref 36.0–46.0)
HEMOGLOBIN: 16.3 g/dL — AB (ref 12.0–15.0)
Potassium: 4.1 mEq/L (ref 3.7–5.3)
SODIUM: 137 meq/L (ref 137–147)
TCO2: 28 mmol/L (ref 0–100)

## 2014-05-08 ENCOUNTER — Ambulatory Visit: Payer: Self-pay

## 2014-05-29 ENCOUNTER — Encounter (HOSPITAL_COMMUNITY): Payer: Self-pay | Admitting: *Deleted

## 2014-05-29 ENCOUNTER — Encounter (HOSPITAL_COMMUNITY)
Admission: EM | Disposition: A | Discharge status: Home / Self Care | Payer: Self-pay | Source: Home / Self Care | Attending: Cardiovascular Disease

## 2014-05-29 ENCOUNTER — Emergency Department (HOSPITAL_COMMUNITY): Payer: Medicaid Other

## 2014-05-29 ENCOUNTER — Inpatient Hospital Stay (HOSPITAL_COMMUNITY)
Admission: EM | Admit: 2014-05-29 | Discharge: 2014-05-31 | DRG: 247 | Disposition: A | Discharge status: Home / Self Care | Payer: Medicaid Other | Attending: Cardiovascular Disease | Admitting: Cardiovascular Disease

## 2014-05-29 DIAGNOSIS — F17210 Nicotine dependence, cigarettes, uncomplicated: Secondary | ICD-10-CM | POA: Diagnosis present

## 2014-05-29 DIAGNOSIS — I214 Non-ST elevation (NSTEMI) myocardial infarction: Secondary | ICD-10-CM | POA: Diagnosis present

## 2014-05-29 DIAGNOSIS — D62 Acute posthemorrhagic anemia: Secondary | ICD-10-CM | POA: Diagnosis not present

## 2014-05-29 DIAGNOSIS — Z955 Presence of coronary angioplasty implant and graft: Secondary | ICD-10-CM

## 2014-05-29 DIAGNOSIS — Z8249 Family history of ischemic heart disease and other diseases of the circulatory system: Secondary | ICD-10-CM | POA: Diagnosis not present

## 2014-05-29 DIAGNOSIS — N189 Chronic kidney disease, unspecified: Secondary | ICD-10-CM | POA: Diagnosis present

## 2014-05-29 DIAGNOSIS — E119 Type 2 diabetes mellitus without complications: Secondary | ICD-10-CM | POA: Diagnosis present

## 2014-05-29 DIAGNOSIS — E871 Hypo-osmolality and hyponatremia: Secondary | ICD-10-CM | POA: Diagnosis not present

## 2014-05-29 DIAGNOSIS — I2510 Atherosclerotic heart disease of native coronary artery without angina pectoris: Secondary | ICD-10-CM | POA: Diagnosis present

## 2014-05-29 DIAGNOSIS — E669 Obesity, unspecified: Secondary | ICD-10-CM | POA: Diagnosis present

## 2014-05-29 DIAGNOSIS — Z720 Tobacco use: Secondary | ICD-10-CM | POA: Diagnosis not present

## 2014-05-29 DIAGNOSIS — Z79899 Other long term (current) drug therapy: Secondary | ICD-10-CM | POA: Diagnosis not present

## 2014-05-29 DIAGNOSIS — I213 ST elevation (STEMI) myocardial infarction of unspecified site: Secondary | ICD-10-CM | POA: Diagnosis not present

## 2014-05-29 DIAGNOSIS — Z7982 Long term (current) use of aspirin: Secondary | ICD-10-CM | POA: Diagnosis not present

## 2014-05-29 DIAGNOSIS — I129 Hypertensive chronic kidney disease with stage 1 through stage 4 chronic kidney disease, or unspecified chronic kidney disease: Secondary | ICD-10-CM | POA: Diagnosis present

## 2014-05-29 DIAGNOSIS — R079 Chest pain, unspecified: Secondary | ICD-10-CM | POA: Diagnosis present

## 2014-05-29 DIAGNOSIS — E785 Hyperlipidemia, unspecified: Secondary | ICD-10-CM | POA: Diagnosis present

## 2014-05-29 DIAGNOSIS — Z6827 Body mass index (BMI) 27.0-27.9, adult: Secondary | ICD-10-CM

## 2014-05-29 HISTORY — PX: PERCUTANEOUS CORONARY STENT INTERVENTION (PCI-S): SHX5485

## 2014-05-29 HISTORY — PX: LEFT HEART CATHETERIZATION WITH CORONARY ANGIOGRAM: SHX5451

## 2014-05-29 LAB — BASIC METABOLIC PANEL
ANION GAP: 11 (ref 5–15)
BUN: 12 mg/dL (ref 6–23)
CALCIUM: 9.5 mg/dL (ref 8.4–10.5)
CO2: 24 mmol/L (ref 19–32)
CREATININE: 0.75 mg/dL (ref 0.50–1.10)
Chloride: 99 mEq/L (ref 96–112)
GFR calc Af Amer: >90 mL/min (ref >90)
GLUCOSE: 356 mg/dL — AB (ref 70–99)
POTASSIUM: 4 mmol/L (ref 3.5–5.1)
Sodium: 134 mmol/L — ABNORMAL LOW (ref 135–145)

## 2014-05-29 LAB — CBC WITH DIFFERENTIAL/PLATELET
Basophils Absolute: 0 10*3/uL (ref 0.0–0.1)
Basophils Relative: 0 % (ref 0–1)
EOS PCT: 0 % (ref 0–5)
Eosinophils Absolute: 0.1 10*3/uL (ref 0.0–0.7)
HEMATOCRIT: 43.9 % (ref 36.0–46.0)
Hemoglobin: 14.9 g/dL (ref 12.0–15.0)
Lymphocytes Relative: 13 % (ref 12–46)
Lymphs Abs: 1.6 10*3/uL (ref 0.7–4.0)
MCH: 29.9 pg (ref 26.0–34.0)
MCHC: 33.9 g/dL (ref 30.0–36.0)
MCV: 88 fL (ref 78.0–100.0)
MONO ABS: 0.7 10*3/uL (ref 0.1–1.0)
MONOS PCT: 6 % (ref 3–12)
NEUTROS ABS: 10.4 10*3/uL — AB (ref 1.7–7.7)
Neutrophils Relative %: 81 % — ABNORMAL HIGH (ref 43–77)
Platelets: 186 10*3/uL (ref 150–400)
RBC: 4.99 MIL/uL (ref 3.87–5.11)
RDW: 12.9 % (ref 11.5–15.5)
WBC: 12.9 10*3/uL — AB (ref 4.0–10.5)

## 2014-05-29 LAB — POCT ACTIVATED CLOTTING TIME
ACTIVATED CLOTTING TIME: 386 s
ACTIVATED CLOTTING TIME: 313 s
Activated Clotting Time: 466 seconds

## 2014-05-29 LAB — CK TOTAL AND CKMB (NOT AT ARMC)
CK TOTAL: 637 U/L — AB (ref 7–177)
CK, MB: 32 ng/mL — AB (ref 0.3–4.0)
RELATIVE INDEX: 5 — AB (ref 0.0–2.5)

## 2014-05-29 LAB — CBG MONITORING, ED: Glucose-Capillary: 284 mg/dL — ABNORMAL HIGH (ref 70–99)

## 2014-05-29 LAB — TROPONIN I
TROPONIN I: 9.83 ng/mL — AB (ref <0.031)
TROPONIN I: 19.73 ng/mL — AB (ref <0.031)
Troponin I: 7.32 ng/mL (ref <0.031)

## 2014-05-29 LAB — MRSA PCR SCREENING: MRSA BY PCR: NEGATIVE

## 2014-05-29 SURGERY — LEFT HEART CATHETERIZATION WITH CORONARY ANGIOGRAM

## 2014-05-29 MED ORDER — NITROGLYCERIN 0.4 MG SL SUBL: 0.4000 mg | SUBLINGUAL_TABLET | SUBLINGUAL | Status: DC | PRN

## 2014-05-29 MED ORDER — LIDOCAINE HCL (PF) 1 % IJ SOLN
INTRAMUSCULAR | Status: AC
  Filled 2014-05-29: qty 30

## 2014-05-29 MED ORDER — TICAGRELOR 90 MG PO TABS
90.0000 mg | ORAL_TABLET | Freq: Two times a day (BID) | ORAL | Status: DC
  Filled 2014-05-29: qty 1

## 2014-05-29 MED ORDER — HEPARIN SODIUM (PORCINE) 1000 UNIT/ML IJ SOLN
INTRAMUSCULAR | Status: AC
Start: 2014-05-29 — End: 2014-05-29
  Filled 2014-05-29: qty 1

## 2014-05-29 MED ORDER — SODIUM CHLORIDE 0.9 % IJ SOLN
3.0000 mL | Freq: Two times a day (BID) | INTRAMUSCULAR | Status: DC
  Administered 2014-05-30 – 2014-05-31 (×3): 3 mL via INTRAVENOUS

## 2014-05-29 MED ORDER — ATORVASTATIN CALCIUM 80 MG PO TABS
80.0000 mg | ORAL_TABLET | Freq: Every day | ORAL | Status: DC
  Administered 2014-05-30: 80 mg via ORAL
  Filled 2014-05-29 (×2): qty 1

## 2014-05-29 MED ORDER — MIDAZOLAM HCL 2 MG/2ML IJ SOLN
INTRAMUSCULAR | Status: AC
Start: 2014-05-29 — End: 2014-05-29
  Filled 2014-05-29: qty 2

## 2014-05-29 MED ORDER — SODIUM CHLORIDE 0.9 % IV SOLN
Freq: Once | INTRAVENOUS | Status: AC
  Administered 2014-05-29: 14:00:00 via INTRAVENOUS

## 2014-05-29 MED ORDER — ASPIRIN 81 MG PO CHEW
81.0000 mg | CHEWABLE_TABLET | Freq: Every day | ORAL | Status: DC
  Administered 2014-05-30 – 2014-05-31 (×2): 81 mg via ORAL
  Filled 2014-05-29 (×2): qty 1

## 2014-05-29 MED ORDER — HEPARIN (PORCINE) IN NACL 100-0.45 UNIT/ML-% IJ SOLN
800.0000 [IU]/h | INTRAMUSCULAR | Status: DC
  Filled 2014-05-29: qty 250

## 2014-05-29 MED ORDER — FENTANYL CITRATE 0.05 MG/ML IJ SOLN
INTRAMUSCULAR | Status: AC
  Filled 2014-05-29: qty 2

## 2014-05-29 MED ORDER — SODIUM CHLORIDE 0.9 % IJ SOLN: 3.0000 mL | INTRAMUSCULAR | Status: DC | PRN

## 2014-05-29 MED ORDER — TICAGRELOR 90 MG PO TABS
90.0000 mg | ORAL_TABLET | Freq: Two times a day (BID) | ORAL | Status: DC
  Administered 2014-05-30 – 2014-05-31 (×3): 90 mg via ORAL
  Filled 2014-05-29 (×4): qty 1

## 2014-05-29 MED ORDER — HEPARIN (PORCINE) IN NACL 2-0.9 UNIT/ML-% IJ SOLN
INTRAMUSCULAR | Status: AC
  Filled 2014-05-29: qty 1000

## 2014-05-29 MED ORDER — METOPROLOL TARTRATE 12.5 MG HALF TABLET
12.5000 mg | ORAL_TABLET | Freq: Two times a day (BID) | ORAL | Status: DC
  Administered 2014-05-29 – 2014-05-31 (×4): 12.5 mg via ORAL
  Filled 2014-05-29 (×5): qty 1

## 2014-05-29 MED ORDER — ASPIRIN 81 MG PO CHEW
324.0000 mg | CHEWABLE_TABLET | Freq: Once | ORAL | Status: AC
  Administered 2014-05-29: 324 mg via ORAL
  Filled 2014-05-29: qty 4

## 2014-05-29 MED ORDER — SODIUM CHLORIDE 0.9 % IV SOLN: INTRAVENOUS | Status: AC

## 2014-05-29 MED ORDER — LISINOPRIL 10 MG PO TABS
10.0000 mg | ORAL_TABLET | Freq: Every day | ORAL | Status: DC
Start: 2014-05-29 — End: 2014-05-31
  Administered 2014-05-29 – 2014-05-31 (×3): 10 mg via ORAL
  Filled 2014-05-29 (×3): qty 1

## 2014-05-29 MED ORDER — TIROFIBAN HCL IV 5 MG/100ML
INTRAVENOUS | Status: AC
  Administered 2014-05-29: 0.15 ug/kg/min via INTRAVENOUS
  Filled 2014-05-29: qty 100

## 2014-05-29 MED ORDER — TICAGRELOR 90 MG PO TABS
ORAL_TABLET | ORAL | Status: AC
  Filled 2014-05-29: qty 2

## 2014-05-29 MED ORDER — NITROGLYCERIN IN D5W 200-5 MCG/ML-% IV SOLN
INTRAVENOUS | Status: AC
  Administered 2014-05-29: 10 ug/min via INTRAVENOUS
  Filled 2014-05-29: qty 250

## 2014-05-29 MED ORDER — NITROGLYCERIN IN D5W 200-5 MCG/ML-% IV SOLN
0.0000 ug/min | INTRAVENOUS | Status: DC
  Administered 2014-05-29: 10 ug/min via INTRAVENOUS

## 2014-05-29 MED ORDER — SODIUM CHLORIDE 0.9 % IV SOLN: INTRAVENOUS | Status: DC

## 2014-05-29 MED ORDER — INSULIN ASPART 100 UNIT/ML ~~LOC~~ SOLN
0.0000 [IU] | Freq: Three times a day (TID) | SUBCUTANEOUS | Status: DC
  Administered 2014-05-30 (×2): 11 [IU] via SUBCUTANEOUS
  Administered 2014-05-31: 8 [IU] via SUBCUTANEOUS
  Administered 2014-05-31: 5 [IU] via SUBCUTANEOUS

## 2014-05-29 MED ORDER — MIDAZOLAM HCL 2 MG/2ML IJ SOLN
INTRAMUSCULAR | Status: AC
  Filled 2014-05-29: qty 2

## 2014-05-29 MED ORDER — ONDANSETRON HCL 4 MG/2ML IJ SOLN: 4.0000 mg | Freq: Four times a day (QID) | INTRAMUSCULAR | Status: DC | PRN

## 2014-05-29 MED ORDER — ACETAMINOPHEN 325 MG PO TABS: 650.0000 mg | ORAL_TABLET | ORAL | Status: DC | PRN

## 2014-05-29 MED ORDER — SODIUM CHLORIDE 0.9 % IV SOLN: 250.0000 mL | INTRAVENOUS | Status: DC | PRN

## 2014-05-29 MED ORDER — NITROGLYCERIN 1 MG/10 ML FOR IR/CATH LAB
INTRA_ARTERIAL | Status: AC
Start: 2014-05-29 — End: 2014-05-29
  Filled 2014-05-29: qty 10

## 2014-05-29 MED ORDER — HEPARIN BOLUS VIA INFUSION
4000.0000 [IU] | Freq: Once | INTRAVENOUS | Status: AC
  Administered 2014-05-29: 4000 [IU] via INTRAVENOUS
  Filled 2014-05-29: qty 4000

## 2014-05-29 MED ORDER — VERAPAMIL HCL 2.5 MG/ML IV SOLN
INTRAVENOUS | Status: AC
  Filled 2014-05-29: qty 2

## 2014-05-29 MED ORDER — ASPIRIN 81 MG PO TABS: 81.0000 mg | ORAL_TABLET | Freq: Every day | ORAL | Status: DC

## 2014-05-29 MED ORDER — ACETAMINOPHEN 325 MG PO TABS
650.0000 mg | ORAL_TABLET | ORAL | Status: DC | PRN
  Administered 2014-05-29 – 2014-05-30 (×3): 650 mg via ORAL
  Filled 2014-05-29 (×3): qty 2

## 2014-05-29 MED ORDER — TIROFIBAN HCL IV 5 MG/100ML
0.1500 ug/kg/min | INTRAVENOUS | Status: AC
  Administered 2014-05-29 (×2): 0.15 ug/kg/min via INTRAVENOUS
  Filled 2014-05-29 (×2): qty 100

## 2014-05-29 NOTE — Interval H&P Note (Signed)
Cath Lab Visit (complete for each Cath Lab visit)  Clinical Evaluation Leading to the Procedure:   ACS: Yes.    Non-ACS:    Anginal Classification: CCS IV  Anti-ischemic medical therapy: No Therapy  Non-Invasive Test Results: No non-invasive testing performed  Prior CABG: No previous CABG    TIMI Score  Patient Information:  TIMI Score is 3   UA/NSTEMI and intermediate-risk features (e.g., TIMI score 3-4) for short-term risk of death or nonfatal MI  Revascularization of the presumed culprit artery   A (9)  Indication: 10; Score: 9   History and Physical Interval Note:  05/29/2014 3:44 PM  [name]  has presented today for surgery, with the diagnosis of non stemi  The various methods of treatment have been discussed with the patient and family. After consideration of risks, benefits and other options for treatment, the patient has consented to  Procedure(s): LEFT HEART CATHETERIZATION WITH CORONARY ANGIOGRAM (N/A) as a surgical intervention .  The patient's history has been reviewed, patient examined, no change in status, stable for surgery.  I have reviewed the patient's chart and labs.  Questions were answered to the patient's satisfaction.     [name] Absher S.

## 2014-05-29 NOTE — ED Notes (Signed)
MD at bedside. 

## 2014-05-29 NOTE — ED Provider Notes (Signed)
CSN: [id number]     Arrival date & time 05/29/14  1113 History   First MD Initiated Contact with Patient 05/29/14 1117     Chief Complaint  Patient presents with  . Chest Pain  . Arm Pain     (Consider location/radiation/quality/duration/timing/severity/associated sxs/prior Treatment) HPI Comments: 44 year old female with history of obesity diabetes, high blood pressure for which she does not take medications, smoking, family history of cardiac presents with chest tightness since Sunday. Fairly constant however she has had a couple of surgeries no symptoms at all. Mild radiation toward the right arm and back. No tearing sensation. No recent cardiac workup. Patient is a smoker. No exertional symptoms. Worse with movement at times, mild burning sensation in the throat. No recent surgery, blood clot hx, leg swelling or active CA  Patient is a 44 y.o. female presenting with chest pain and arm pain. The history is provided by the patient.  Chest Pain Associated symptoms: back pain   Associated symptoms: no abdominal pain, no fever, no headache, no shortness of breath and not vomiting   Arm Pain Associated symptoms include chest pain. Pertinent negatives include no abdominal pain, no headaches and no shortness of breath.    Past Medical History  Diagnosis Date  . Hypertension   . Diabetes mellitus without complication    History reviewed. No pertinent past surgical history. No family history on file. History  Substance Use Topics  . Smoking status: Current Every Day Smoker -- 0.50 packs/day    Types: Cigarettes  . Smokeless tobacco: Not on file  . Alcohol Use: No   OB History    No data available     Review of Systems  Constitutional: Negative for fever and chills.  HENT: Negative for congestion.   Eyes: Negative for visual disturbance.  Respiratory: Negative for shortness of breath.   Cardiovascular: Positive for chest pain.  Gastrointestinal: Negative for vomiting and  abdominal pain.  Genitourinary: Negative for dysuria and flank pain.  Musculoskeletal: Positive for back pain. Negative for neck pain and neck stiffness.  Skin: Negative for rash.  Neurological: Negative for light-headedness and headaches.      Allergies  Review of patient's allergies indicates no known allergies.  Home Medications   Prior to Admission medications   Medication Sig Start Date End Date Taking? Authorizing Provider  aspirin 81 MG tablet Take 81 mg by mouth daily.   Yes Historical Provider, MD  metFORMIN (GLUCOPHAGE) 500 MG tablet Take 1 tablet (500 mg total) by mouth 2 (two) times daily with a meal. 03/11/14  Yes Mathis FareJennifer Lee H Presson, PA  lisinopril (PRINIVIL,ZESTRIL) 10 MG tablet Take 1 tablet (10 mg total) by mouth daily. 03/11/14   Mathis FareJennifer Lee H Presson, PA  polyethylene glycol (MIRALAX / GLYCOLAX) packet Take 17 g by mouth daily. Mix with 8 oz of water and drink daily x 7 days 03/11/14   Jess BartersJennifer Lee H Presson, PA   BP 122/67 mmHg  Pulse 82  Temp(Src) 97.5 F (36.4 C) (Oral)  Resp 16  Ht 5\' 3"  (1.6 m)  Wt 157 lb (71.215 kg)  BMI 27.82 kg/m2  SpO2 100% Physical Exam  Constitutional: She is oriented to person, place, and time. She appears well-developed and well-nourished.  HENT:  Head: Normocephalic and atraumatic.  Eyes: Conjunctivae are normal. Right eye exhibits no discharge. Left eye exhibits no discharge.  Neck: Normal range of motion. Neck supple. No tracheal deviation present.  Cardiovascular: Normal rate, regular rhythm and intact distal pulses.  Pulmonary/Chest: Effort normal and breath sounds normal.  Abdominal: Soft. She exhibits no distension. There is no tenderness. There is no guarding.  Musculoskeletal: She exhibits no edema.  Neurological: She is alert and oriented to person, place, and time.  Skin: Skin is warm. No rash noted.  Psychiatric: She has a normal mood and affect.  Nursing note and vitals reviewed.   ED Course  Procedures  (including critical care time) CRITICAL CARE Performed by: Enid Skeens   Total critical care time: 35 min  Critical care time was exclusive of separately billable procedures and treating other patients.  Critical care was necessary to treat or prevent imminent or life-threatening deterioration.  Critical care was time spent personally by me on the following activities: development of treatment plan with patient and/or surrogate as well as nursing, discussions with consultants, evaluation of patient's response to treatment, examination of patient, obtaining history from patient or surrogate, ordering and performing treatments and interventions, ordering and review of laboratory studies, ordering and review of radiographic studies, pulse oximetry and re-evaluation of patient's condition.    EMERGENCY DEPARTMENT [place] CARDIAC EXAM "Study: Limited Ultrasound of the heart and pericardium"  INDICATIONS:chest pain, troponin Multiple views of the heart and pericardium were obtained in real-time with a multi-frequency probe.  PERFORMED GN:FAOZHY  IMAGES ARCHIVED?: Yes  FINDINGS: No pericardial effusion, Normal contractility and Tamponade physiology absent  LIMITATIONS:  Body habitus  VIEWS USED: Parasternal long axis, Parasternal short axis and Apical 4 chamber   INTERPRETATION: Cardiac activity present, Pericardial effusioin absent and Cardiac tamponade absent   Labs Review Labs Reviewed  BASIC METABOLIC PANEL - Abnormal; Notable for the following:    Sodium 134 (*)    Glucose, Bld 356 (*)    All other components within normal limits  CBC WITH DIFFERENTIAL - Abnormal; Notable for the following:    WBC 12.9 (*)    Neutrophils Relative % 81 (*)    Neutro Abs 10.4 (*)    All other components within normal limits  TROPONIN I - Abnormal; Notable for the following:    Troponin I 7.32 (*)    All other components within normal limits  TROPONIN I  HEPARIN LEVEL (UNFRACTIONATED)     Imaging Review Dg Chest 2 View  05/29/2014   CLINICAL DATA:  Acute chest pain  EXAM: CHEST  2 VIEW  COMPARISON:  None.  FINDINGS: The heart size and mediastinal contours are within normal limits. Both lungs are clear. The visualized skeletal structures are unremarkable.  IMPRESSION: No active cardiopulmonary disease.   Electronically Signed   By: Marlan Palau M.D.   On: 05/29/2014 12:11     EKG Interpretation   Date/Time:  Tuesday May 29 2014 11:20:27 EST Ventricular Rate:  82 PR Interval:  127 QRS Duration: 86 QT Interval:  323 QTC Calculation: 377 R Axis:   -12 Text Interpretation:  sinus rrhythm non specific T wave inversions and ST  changes inferior and lateral Reconfirmed by Milas Schappell  MD, Ila Landowski ([id number]) on  05/29/2014 1:24:14 PM      MDM   Final diagnoses:  Acute chest pain  NSTEMI (non-ST elevated myocardial infarction)   Patient with cardiac risk factors presents with intermittent chest pain. Patient well-appearing in ER, smiling, mild chest pain currently. EKG reviewed no acute ST elevation, non specific T wave inversions. Troponins pending. Troponin returned at 7. Pt chest pain free on recheck.  Immediately discussed the case with cardiology will evaluate and likely take the Cath Lab. Patient  had repeat EKG without significant changes. Heparin ordered per cardiology. Chest x-ray results reviewed no acute findings. Dr Allyson Sabal evaluated in ER, rec stat cath lab, transfer placed.   The patients results and plan were reviewed and discussed.   Any x-rays performed were personally reviewed by myself.   Differential diagnosis were considered with the presenting HPI.  Medications  0.9 %  sodium chloride infusion (not administered)  heparin bolus via infusion 4,000 Units (not administered)  heparin ADULT infusion 100 units/mL (25000 units/250 mL) (not administered)  aspirin chewable tablet 324 mg (324 mg Oral Given 05/29/14 1211)    Filed Vitals:   05/29/14 1121  05/29/14 1331 05/29/14 1351  BP: 140/75  122/67  Pulse: 78  82  Temp: 97.5 F (36.4 C)    TempSrc: Oral    Resp: 16  16  Height:   (1.6 m)   Weight:  157 lb (71.215 kg)   SpO2: 100%  100%    Final diagnoses:  Acute chest pain  NSTEMI (non-ST elevated myocardial infarction)    Admission/ observation were discussed with the admitting physician, patient and/or family and they are comfortable with the plan.        Enid Skeens, MD 05/29/14 [phone]

## 2014-05-29 NOTE — Progress Notes (Signed)
Dr. Varanasi talking w/patient 

## 2014-05-29 NOTE — ED Notes (Addendum)
Pt states she has had intermittent central chest pain and upper back pain, nausea since Sunday night. Pt states this morning she began having right arm pain as well. Pt also states she had a burning sensation in her throat, which resolved this morning.

## 2014-05-29 NOTE — Progress Notes (Signed)
ANTICOAGULATION CONSULT NOTE - Initial Consult  Pharmacy Consult for IV heparin Indication: chest pain/ACS  No Known Allergies  Patient Measurements: Height: 5\' 3"  (160 cm) Weight: 157 lb (71.215 kg) IBW/kg (Calculated) : 52.4 Heparin Dosing Weight: 67.2  Vital Signs: Temp: 97.5 F (36.4 C) (01/19 1121) Temp Source: Oral (01/19 1121) BP: 122/67 mmHg (01/19 1351) Pulse Rate: 82 (01/19 1351)  Labs:  Recent Labs  05/29/14 1155  HGB 14.9  HCT 43.9  PLT 186  CREATININE 0.75  TROPONINI 7.32*    Estimated Creatinine Clearance: 86.6 mL/min (by C-G formula based on Cr of 0.75).   Medical History: Past Medical History  Diagnosis Date  . Hypertension   . Diabetes mellitus without complication     Medications:  Scheduled:   Infusions:  . sodium chloride      Assessment: 44 yo female presented to ER with intermittent CP and nausea since Sunday night with right arm pain starting this AM. Patient with elevated troponins to start heparin per pharmacy dosing for potential ACS. Baseline labs OK  Goal of Therapy:  Heparin level 0.3-0.7 units/ml Monitor platelets by anticoagulation protocol: Yes   Plan:  1) IV heparin 4000 unit bolus then 2) IV heparin infusion rate of 800 units/hr 3) Check heparin level 6 hrs after start of IV heparin 4) Daily heparin level and CBC   Hessie KnowsJustin M Emmagene Ortner, PharmD, BCPS Pager [phone] 05/29/2014 1:57 PM

## 2014-05-29 NOTE — CV Procedure (Addendum)
PROCEDURE:  Left heart catheterization with selective coronary angiography, PCI mid circumflex; PCI proximal circumflex; PCI ostial/proximal PDA; PCI mid RCA.    INDICATIONS:  NSTEMI  The risks, benefits, and details of the procedure were explained to the patient.  The patient verbalized understanding and wanted to proceed.  Informed written consent was obtained.  PROCEDURE TECHNIQUE:  After Xylocaine anesthesia a 33F slender sheath was placed in the right radial artery with a single anterior needle wall stick.   IV Heparin was given.  Right coronary angiography was done using a Judkins R4 guide catheter.  Left coronary angiography was done using a Judkins L3.5 guide catheter.  Left heart catheterization was done using a JR4 catheter.  A TR band was used for hemostasis.   CONTRAST:  Total of 225 cc.  COMPLICATIONS:  None.    HEMODYNAMICS:  Aortic pressure was 104/64; LV pressure was 107/4; LVEDP 13.  There was no gradient between the left ventricle and aorta.    ANGIOGRAPHIC DATA:   The left main coronary artery is a medium-size vessel which appears patent.  The left anterior descending artery is a medium-size vessel. There is moderate diffuse disease proximally. There are several medium-sized diagonals which are patent. The mid vessel is larger and patent. The distal vessel is small and moderately diseased.  The left circumflex artery is a medium size vessel. There is a proximal 75% stenosis. There are several small obtuse marginals which are patent. After the third obtuse marginal, the circumflex is occluded. After the intervention, it was noted that the remainder of the circumflex is small but there is a fourth obtuse marginal which is patent with some diffuse disease.  The right coronary artery is a medium size, dominant vessel. There is a mild proximal disease. In the mid vessel, there is a 90% stenosis. The posterior lateral artery is small and diffusely diseased but patent. The  PDA is occluded with a trickle of flow into the distal vessel.  Multiple doses of nitroglycerin were given into the RCA without any significant change in the disease.  LEFT VENTRICULOGRAM:  Left ventricular angiogram was not  Done. LVEDP was 13 mmHg.  PCI NARRATIVE:   IV heparin and IV tirofiban were used for anticoagulation. ACT was used to check that the anticoagulation was therapeutic.  A 5 [place] EBU 3 guiding catheter was used to engage the left main. A pro-water wire was advanced to the mid circumflex but would not cross the occlusion in the mid circumflex. A fielder XT wire was then advanced and did cross into the more distal circumflex. The mid circumflex was ballooned with a 2.0 by 12 balloon  with several inflations. There was restoration of TIMI-3 flow. The distal vessel was very small.  The fielder wire was switched for the pro-water wire. The distal vessel appeared too small to stent. We also would've had to jail a significant obtuse marginal branch. Therefore we elected to just move on with the balloon angioplasty result. There is no visible dissection.  The proximal circumflex was then stented with a 2.25 x 12 Synergy drug-eluting stent. This was postdilated with a 2.25 x 8 noncompliant balloon, inflated to high pressure. There was an excellent angiographic result.  Attention was then turned to the right coronary artery. A 6 [place] JR4 catheter was used to engage the RCA. A pro-water wire was advanced to the PDA area but would not cross the occlusion. A fielder XT was then used which did  cross into the PDA. The 2.0 x 12 balloon was inflated. The fielder wire was switched out for the pro-water wire. There was a dissection in the proximal PDA with poor flow into what looked to be a very small vessel.  At the end of the procedure, it was noted that the stenosis in the proximal PDA was relieved, but there was slow flow and possible collateral flow into the PDA.   Attention was then turned to  the mid RCA.  The 2.0 x 12 balloon and then used to predilate the mid RCA. The mid RCA was then stented with a 2.25 x 16 Synergy drug-eluting stent. The stent was postdilated with the same 2.25 x 8 balloon. There was an excellent angiographic result with no residual stenosis in the mid RCA.   IMPRESSIONS:  1. Patent  left main coronary artery. 2. 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  Patent diagonal branches. 3. 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  This was successfully treated with a 2.25 x 12 Synergy drug-eluting stent, postdilated to 2.3 mm in diameter. Possible culprit for today's presentation was the occluded mid circumflex. This was successfully treated with balloon angioplasty with a 2.0 x 12 balloon and subsequent restoration of TIMI-3 flow. The distal vessel and its branches are small and diffusely diseased, particularly at the terminal ends of the branches. 4. Severe disease in the mid right coronary artery.  This was successfully treated with a 2.25 x 16 Synergy drug-eluting stent, postdilated to 2.3 mm in diameter.  5. LVEDP 13 mmHg.  Ejection fraction be assessed with echocardiogram . 6.  I suspect that she had 2 culprit vessels for this MI. The occlusion in the mid circumflex and the occlusion in her small PDA were both cross fairly easily indicating that they were new occlusions and likely culprit vessels.  RECOMMENDATIONS:  She'll need aggressive risk factor modification and medical therapy. Check echocardiogram to evaluate left ventricular function and to evaluate for any valvular disease either.  Would recommend lifelong dual antiplatelet therapy as long as she tolerates given the severity of her small vessel disease. She does need to take dual antiplatelet therapy for a year without any interruption. She needs smoking cessation and diabetes control. Follow-up care with Dr. Allyson SabalBerry.

## 2014-05-29 NOTE — ED Notes (Signed)
Pt transported to XR.  

## 2014-05-29 NOTE — Progress Notes (Signed)
CRITICAL VALUE ALERT  Critical value received:  CKMB-32, Trop-19.73  Date of notification:  05/29/2014  Time of notification:  11:32 PM  Critical value read back:Yes.    Nurse who received alert:  Terrilyn SaverHopper, Rachella Basden Anderson  MD notified (1st page):  Dr. Adolm JosephWhitlock  Time of first page:  11:33 PM  MD notified (2nd page):  Time of second page:  Responding MD:  Dr. Adolm JosephWhitlock  Time MD responded:  11:33 PM

## 2014-05-29 NOTE — Progress Notes (Signed)
eLink Physician-Brief Progress Note Patient Name: [name] DOB: 04-Sep-1970 MRN: [id number]   Date of Service  05/29/2014  HPI/Events of Note  Admitted with NSTEMI. PMH of DM, Htn  eICU Interventions  Care plan reviewed.  Usual eLink monitoring will be provided     Intervention Category Evaluation Type: New Patient Evaluation  Billy FischerDavid Simonds 05/29/2014, 7:23 PM

## 2014-05-29 NOTE — H&P (Signed)
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 He CRF + Fx, DM, HTN and tobacco. She developed CP on Sunday evening with recurrent CP yesterday and this morning. She came to [facility] ER for evaluation. Her enz are + Trop 7, EKG shows IL ischemia. Currrently pain free. Plan to transport to [facility] cath lab for cardiac cath   Problem List: Past Medical History  Diagnosis Date  . Hypertension   . Diabetes mellitus without complication     History reviewed. No pertinent past surgical history.   Allergies: No Known Allergies   Home Medications Current Facility-Administered Medications  Medication Dose Route Frequency Provider Last Rate Last Dose  . heparin ADULT infusion 100 units/mL (25000 units/250 mL)  800 Units/hr Intravenous Continuous Berkley Harvey, [facility]       Current Outpatient Prescriptions  Medication Sig Dispense Refill  . aspirin 81 MG tablet Take 81 mg by mouth daily.    . metFORMIN (GLUCOPHAGE) 500 MG tablet Take 1 tablet (500 mg total) by mouth 2 (two) times daily with a meal. 60 tablet 0  . lisinopril (PRINIVIL,ZESTRIL) 10 MG tablet Take 1 tablet (10 mg total) by mouth daily. 30 tablet 0  . polyethylene glycol (MIRALAX / GLYCOLAX) packet Take 17 g by mouth daily. Mix with 8 oz of water and drink daily x 7 days 14 each 0     No family history on file.   History   Social History  . Marital Status: Single    Spouse Name: N/A    Number of Children: N/A  . Years of Education: N/A   Occupational History  . Not on file.   Social History Main Topics  . Smoking status: Current Every Day Smoker -- 0.50 packs/day    Types: Cigarettes  . Smokeless tobacco: Not on file  . Alcohol Use: No  . Drug Use: No  . Sexual Activity: Not on file   Other Topics Concern  . Not on file    Social History Narrative     Review of Systems: General: negative for chills, fever, night sweats or weight changes.  Cardiovascular: negative for chest pain, dyspnea on exertion, edema, orthopnea, palpitations, paroxysmal nocturnal dyspnea or shortness of breath Dermatological: negative for rash Respiratory: negative for cough or wheezing Urologic: negative for hematuria Abdominal: negative for nausea, vomiting, diarrhea, bright red blood per rectum, melena, or hematemesis Neurologic: negative for visual changes, syncope, or dizziness All other systems reviewed and are otherwise negative except as noted above.  Physical Exam: Blood pressure 122/67, pulse 82, temperature 97.5 F (36.4 C), temperature source Oral, resp. rate 16, height  (1.6 m), weight 157 lb (71.215 kg), SpO2 100 %.  General appearance: alert and no distress Neck: no adenopathy, no carotid bruit, no JVD, supple, symmetrical, trachea midline and thyroid not enlarged, symmetric, no tenderness/mass/nodules Lungs: clear to auscultation bilaterally Heart: regular rate and rhythm, S1, S2 normal, no murmur, click, rub or gallop Extremities: extremities normal, atraumatic, no cyanosis or edema    Labs:   Results for orders placed or performed during the hospital encounter of 05/29/14 (from the past 24 hour(s))  Basic metabolic panel     Status: Abnormal   Collection Time: 05/29/14 11:55 AM  Result Value Ref Range   Sodium 134 (L) 135 -  145 mmol/L   Potassium 4.0 3.5 - 5.1 mmol/L   Chloride 99 96 - 112 mEq/L   CO2 24 19 - 32 mmol/L   Glucose, Bld 356 (H) 70 - 99 mg/dL   BUN 12 6 - 23 mg/dL   Creatinine, Ser 1.610.75 0.50 - 1.10 mg/dL   Calcium 9.5 8.4 - 09.610.5 mg/dL   GFR calc non Af Amer >90 >90 mL/min   GFR calc Af Amer >90 >90 mL/min   Anion gap 11 5 - 15  CBC with Differential     Status: Abnormal   Collection Time: 05/29/14 11:55 AM  Result Value Ref Range   WBC 12.9 (H) 4.0 - 10.5 K/uL   RBC 4.99 3.87 -  5.11 MIL/uL   Hemoglobin 14.9 12.0 - 15.0 g/dL   HCT 04.543.9 40.936.0 - 81.146.0 %   MCV 88.0 78.0 - 100.0 fL   MCH 29.9 26.0 - 34.0 pg   MCHC 33.9 30.0 - 36.0 g/dL   RDW 91.412.9 78.211.5 - 95.615.5 %   Platelets 186 150 - 400 K/uL   Neutrophils Relative % 81 (H) 43 - 77 %   Neutro Abs 10.4 (H) 1.7 - 7.7 K/uL   Lymphocytes Relative 13 12 - 46 %   Lymphs Abs 1.6 0.7 - 4.0 K/uL   Monocytes Relative 6 3 - 12 %   Monocytes Absolute 0.7 0.1 - 1.0 K/uL   Eosinophils Relative 0 0 - 5 %   Eosinophils Absolute 0.1 0.0 - 0.7 K/uL   Basophils Relative 0 0 - 1 %   Basophils Absolute 0.0 0.0 - 0.1 K/uL  Troponin I     Status: Abnormal   Collection Time: 05/29/14 11:55 AM  Result Value Ref Range   Troponin I 7.32 (HH) <0.031 ng/mL     Radiology/Studies: Dg Chest 2 View  05/29/2014   CLINICAL DATA:  Acute chest pain  EXAM: CHEST  2 VIEW  COMPARISON:  None.  FINDINGS: The heart size and mediastinal contours are within normal limits. Both lungs are clear. The visualized skeletal structures are unremarkable.  IMPRESSION: No active cardiopulmonary disease.   Electronically Signed   By: Marlan Palauharles  Clark M.D.   On: 05/29/2014 12:11    EKG: NSR with IL TWI  ASSESSMENT AND PLAN:  Active Problems:   * No active hospital problems. *  1. NSTEMI- CP for 48 hrs, currently pain free with + enz and EKG changes. Plan ASA, hep bolus, [facility] 2. HTN- Currently on no meds but BP OK 3. DM 4. Tobacco abuse   PLAN:   Hep [facility]us [facility]. Procedure explained to Pt including risks and benefits.   York PellantSigned, BERRY,JONATHAN J, PA-C 05/29/2014, 2:23 PM

## 2014-05-30 ENCOUNTER — Encounter (HOSPITAL_COMMUNITY): Payer: Self-pay | Admitting: Interventional Cardiology

## 2014-05-30 DIAGNOSIS — E119 Type 2 diabetes mellitus without complications: Secondary | ICD-10-CM

## 2014-05-30 DIAGNOSIS — Z720 Tobacco use: Secondary | ICD-10-CM

## 2014-05-30 LAB — CBC
HCT: 31.9 % — ABNORMAL LOW (ref 36.0–46.0)
Hemoglobin: 10.8 g/dL — ABNORMAL LOW (ref 12.0–15.0)
MCH: 29.1 pg (ref 26.0–34.0)
MCHC: 33.9 g/dL (ref 30.0–36.0)
MCV: 86 fL (ref 78.0–100.0)
PLATELETS: 181 10*3/uL (ref 150–400)
RBC: 3.71 MIL/uL — AB (ref 3.87–5.11)
RDW: 13 % (ref 11.5–15.5)
WBC: 10.4 10*3/uL (ref 4.0–10.5)

## 2014-05-30 LAB — COMPREHENSIVE METABOLIC PANEL
ALBUMIN: 2.7 g/dL — AB (ref 3.5–5.2)
ALK PHOS: 65 U/L (ref 39–117)
ALT: 17 U/L (ref 0–35)
AST: 50 U/L — AB (ref 0–37)
Anion gap: 10 (ref 5–15)
BUN: 9 mg/dL (ref 6–23)
CALCIUM: 8.4 mg/dL (ref 8.4–10.5)
CO2: 22 mmol/L (ref 19–32)
Chloride: 101 mEq/L (ref 96–112)
Creatinine, Ser: 0.66 mg/dL (ref 0.50–1.10)
GFR calc Af Amer: >90 mL/min (ref >90)
GFR calc non Af Amer: >90 mL/min (ref >90)
Glucose, Bld: 319 mg/dL — ABNORMAL HIGH (ref 70–99)
POTASSIUM: 3.8 mmol/L (ref 3.5–5.1)
SODIUM: 133 mmol/L — AB (ref 135–145)
TOTAL PROTEIN: 5.5 g/dL — AB (ref 6.0–8.3)
Total Bilirubin: 0.8 mg/dL (ref 0.3–1.2)

## 2014-05-30 LAB — TROPONIN I
TROPONIN I: 10.85 ng/mL — AB (ref <0.031)
Troponin I: 15.99 ng/mL (ref <0.031)

## 2014-05-30 LAB — GLUCOSE, CAPILLARY
GLUCOSE-CAPILLARY: 338 mg/dL — AB (ref 70–99)
GLUCOSE-CAPILLARY: 334 mg/dL — AB (ref 70–99)
Glucose-Capillary: 330 mg/dL — ABNORMAL HIGH (ref 70–99)
Glucose-Capillary: 107 mg/dL — ABNORMAL HIGH (ref 70–99)
Glucose-Capillary: 335 mg/dL — ABNORMAL HIGH (ref 70–99)

## 2014-05-30 LAB — CK TOTAL AND CKMB (NOT AT ARMC)
CK, MB: 15.4 ng/mL (ref 0.3–4.0)
CK, MB: 29.5 ng/mL — AB (ref 0.3–4.0)
RELATIVE INDEX: 3.6 — AB (ref 0.0–2.5)
Relative Index: 5.1 — ABNORMAL HIGH (ref 0.0–2.5)
Total CK: 576 U/L — ABNORMAL HIGH (ref 7–177)
Total CK: 426 U/L — ABNORMAL HIGH (ref 7–177)

## 2014-05-30 LAB — LIPID PANEL
CHOL/HDL RATIO: 5.4 ratio
CHOLESTEROL: 195 mg/dL (ref 0–200)
HDL: 36 mg/dL — ABNORMAL LOW (ref >39)
LDL Cholesterol: 125 mg/dL — ABNORMAL HIGH (ref 0–99)
TRIGLYCERIDES: 169 mg/dL — AB (ref <150)
VLDL: 34 mg/dL (ref 0–40)

## 2014-05-30 LAB — PLATELET COUNT: Platelets: 173 10*3/uL (ref 150–400)

## 2014-05-30 MED ORDER — NICOTINE 21 MG/24HR TD PT24
21.0000 mg | MEDICATED_PATCH | Freq: Every day | TRANSDERMAL | Status: DC
  Administered 2014-05-30 – 2014-05-31 (×2): 21 mg via TRANSDERMAL
  Filled 2014-05-30 (×2): qty 1

## 2014-05-30 MED ORDER — LIVING WELL WITH DIABETES BOOK
Freq: Once | Status: AC
  Administered 2014-05-30: 11:00:00
  Filled 2014-05-30: qty 1

## 2014-05-30 MED ORDER — TRAMADOL HCL 50 MG PO TABS
25.0000 mg | ORAL_TABLET | Freq: Once | ORAL | Status: AC
  Administered 2014-05-30: 25 mg via ORAL
  Filled 2014-05-30: qty 1

## 2014-05-30 MED ORDER — CETYLPYRIDINIUM CHLORIDE 0.05 % MT LIQD: 7.0000 mL | Freq: Four times a day (QID) | OROMUCOSAL | Status: DC

## 2014-05-30 MED ORDER — INSULIN GLARGINE 100 UNIT/ML ~~LOC~~ SOLN
10.0000 [IU] | Freq: Every day | SUBCUTANEOUS | Status: DC
  Administered 2014-05-30 – 2014-05-31 (×2): 10 [IU] via SUBCUTANEOUS
  Filled 2014-05-30 (×2): qty 0.1

## 2014-05-30 MED ORDER — ENOXAPARIN SODIUM 40 MG/0.4ML ~~LOC~~ SOLN
40.0000 mg | SUBCUTANEOUS | Status: DC
  Administered 2014-05-30 – 2014-05-31 (×2): 40 mg via SUBCUTANEOUS
  Filled 2014-05-30 (×2): qty 0.4

## 2014-05-30 MED ORDER — CHLORHEXIDINE GLUCONATE 0.12 % MT SOLN: 15.0000 mL | Freq: Two times a day (BID) | OROMUCOSAL | Status: DC

## 2014-05-30 NOTE — Progress Notes (Addendum)
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 Found to have severe CAD. Underwent PCI of mLCX and mRCA on 1/19 with DES.   Chest sore. No angina. Breathing ok       Intake/Output Summary (Last 24 hours) at 05/30/14 1020 Last data filed at 05/30/14 0900  Gross per 24 hour  Intake  840.7 ml  Output      0 ml  Net  840.7 ml    Current meds: . aspirin  81 mg Oral Daily  . atorvastatin  80 mg Oral q1800  . insulin aspart  0-15 Units Subcutaneous TID WC  . lisinopril  10 mg Oral Daily  . metoprolol tartrate  12.5 mg Oral BID  . sodium chloride  3 mL Intravenous Q12H  . ticagrelor  90 mg Oral BID   Infusions: . nitroGLYCERIN Stopped (05/29/14 1900)  . tirofiban 0.15 mcg/kg/min (05/30/14 0900)     Objective:  Blood pressure 84/47, pulse 91, temperature 99.2 F (37.3 C), temperature source Oral, resp. rate 20, height  (1.6 m), weight 72.1 kg (158 lb 15.2 oz), SpO2 99 %. Weight change:   Physical Exam: General:  Well appearing. No resp difficulty HEENT: normal x for poor dentition Neck: supple. JVP flat. Carotids 2+ bilat; no bruits. No lymphadenopathy or thryomegaly appreciated. Cor: PMI nondisplaced. Regular tachy. No rubs, gallops or murmurs. Lungs: clear Abdomen: soft, nontender, nondistended. No hepatosplenomegaly. No bruits or masses. Good bowel sounds. Extremities: no cyanosis, clubbing, rash, edema R wrist site ok Neuro: alert & orientedx3, cranial nerves grossly intact. moves all 4 extremities w/o difficulty. Affect pleasant  Telemetry: St 100  Lab Results: Basic Metabolic Panel:  Recent Labs Lab 05/29/14 1155 05/30/14 0600  NA 134* 133*  K 4.0 3.8  CL 99 101  CO2 24 22  GLUCOSE 356* 319*  BUN 12 9  CREATININE 0.75 0.66  CALCIUM 9.5 8.4   Liver Function Tests:  Recent Labs Lab 05/30/14 0600  AST 50*  ALT 17  ALKPHOS 65  BILITOT 0.8  PROT 5.5*  ALBUMIN 2.7*   No results for input(s): LIPASE,  AMYLASE in the last 168 hours. No results for input(s): AMMONIA in the last 168 hours. CBC:  Recent Labs Lab 05/29/14 1155 05/29/14 2347 05/30/14 0600  WBC 12.9*  --  10.4  NEUTROABS 10.4*  --   --   HGB 14.9  --  10.8*  HCT 43.9  --  31.9*  MCV 88.0  --  86.0  PLT 186 173 181   Cardiac Enzymes:  Recent Labs Lab 05/29/14 1155 05/29/14 1352 05/29/14 2140 05/29/14 2347 05/30/14 0600  CKTOTAL  --   --  637* 576* 426*  CKMB  --   --  32.0* 29.5* 15.4*  TROPONINI 7.32* 9.83* 19.73* 15.99* 10.85*   BNP: Invalid input(s): POCBNP CBG:  Recent Labs Lab 05/29/14 1507 05/29/14 2242 05/30/14 0734  GLUCAP 284* 338* 330*   Microbiology: No results found for: CULT No results for input(s): CULT, SDES in the last 168 hours.  Imaging: Dg Chest 2 View  05/29/2014   CLINICAL DATA:  Acute chest pain  EXAM: CHEST  2 VIEW  COMPARISON:  None.  FINDINGS: The heart size and mediastinal contours are within normal limits. Both lungs are clear. The visualized skeletal structures are unremarkable.  IMPRESSION: No active cardiopulmonary disease.   Electronically Signed   By: Marlan Palau M.D.   On: 05/29/2014 12:11  ASSESSMENT:  1. NSTEMI 05/29/14 2. CAD         -s/p 2.25 x 12 Synergy drug-eluting stent to mLCX,         -s/p 2.25 x 16 Synergy drug-eluting stent to mRCA 3. DM2, poorly controlled 4. Tobacco use 5. HTN 6. Acute blood loss anemia 7. Hyponatremia  PLAN/DISCUSSION:  Doing well post NSTEMI. Will get echo today. BP too low to titrate meds further. Continue ASA/Brilinta/statin/BB/ACE-I. Cardiac rehab seeing will need outpatient referral.. Sugars poorly controlled. Cover with insulin. Check HgBA1c. Diabetes consult. Consider metformin on d/c. Counseled on need for smoking cessation and DM2 control. Transfer to tele. Hopefully home in am.   Unclear why H/H is down. Will recheck CBC in am.    LOS: 1 day    Arvilla Meresaniel Bensimhon, MD 05/30/2014, 10:20 AM

## 2014-05-30 NOTE — Care Management Note (Addendum)
    Page 1 of 1   05/31/2014     4:46:05 PM CARE MANAGEMENT NOTE 05/31/2014  Patient:  [name],[name]   Account Number:  [id number]  Date Initiated:  05/30/2014  Documentation initiated by:  Junius CreamerWELL,DEBBIE  Subjective/Objective Assessment:   adm w nstemi     Action/Plan:   lives w fam, pcp dr Ardyth Galwilliam [place]   Anticipated DC Date:  05/31/2014   Anticipated DC Plan:  HOME/SELF CARE      DC Planning Services  CM consult  Medication Assistance  [facility]      Choice offered to / List presented to:             Status of service:  Completed, signed off Medicare Important Message given?   (If response is "NO", the following Medicare IM given date fields will be blank) Date Medicare IM given:   Medicare IM given by:   Date Additional Medicare IM given:   Additional Medicare IM given by:    Discharge Disposition:  HOME/SELF CARE  Per UR Regulation:  Reviewed for med. necessity/level of care/duration of stay  If discussed at Long Length of Stay Meetings, dates discussed:    Comments:  05/31/14- 1100- Donn PieriniKristi Yamel Bale RN, BSN [phone] F/U done for Brilinta needs, confirmed with pt that she had 30 day free card- assistance forms completed by PA- and given to pt with instructions to fill out as soon as possible and include her copy of tax return and mail or fax off to drug company- pt to f/u with this. No further needs identified.   1/20 0932 debbie dowell rn,bsn gave pt 30day free brilinta card. pt states no ins. gave pt inform on [place] [facility] and other [place] [facility] resourses.

## 2014-05-30 NOTE — Progress Notes (Signed)
Inpatient Diabetes Program Recommendations  AACE/ADA: New Consensus Statement on Inpatient Glycemic Control (2013)  Target Ranges:  Prepandial:   less than 140 mg/dL      Peak postprandial:   less than 180 mg/dL (1-2 hours)      Critically ill patients:  140 - 180 mg/dL   Inpatient Diabetes Program Recommendations Insulin - Basal: add Lantus 10 units  HgbA1C: order to assess prehospital glucose control  Thank you  Piedad ClimesGina Hindy Perrault BSN, RN,CDE Inpatient Diabetes Coordinator [phone] (team pager)

## 2014-05-30 NOTE — Progress Notes (Signed)
CARDIAC REHAB PHASE I   PRE:  Rate/Rhythm: 103 ST  BP:  Supine: 93/59  Sitting:   Standing:    SaO2: 98%RA  MODE:  Ambulation: 350 ft   POST:  Rate/Rhythm: 124 ST  BP:  Supine:   Sitting: 96/61  Standing:    SaO2: 100%RA 0930-1045 Pt aware that she needs to make risk factor changes. Discussed MI restrictions, stent/brilinta, risk factors, carb counting, NTG use, smoking cessation. She voiced understanding. Gave smoking cessation handouts and offered fake cigarette. Pt stated she might use nicotine patches. Did cold [place] with both pregnancies but stated started back after. Discussed with pt that she cannot smoke with patches on. To recliner after walk. Stressed importance of brilinta with stent and pt has booklet. Will complete ex ed tomorrow. Pt was not on carb modified diet. RN to change diet. Pt shown how to look at menu to see carb content in foods here at hospital.   [name] Nuttingharlene Maya Scholer, RN BSN  05/30/2014 10:41 AM

## 2014-05-31 DIAGNOSIS — I213 ST elevation (STEMI) myocardial infarction of unspecified site: Secondary | ICD-10-CM

## 2014-05-31 LAB — GLUCOSE, CAPILLARY
GLUCOSE-CAPILLARY: 238 mg/dL — AB (ref 70–99)
Glucose-Capillary: 294 mg/dL — ABNORMAL HIGH (ref 70–99)

## 2014-05-31 LAB — CBC
HCT: 32.6 % — ABNORMAL LOW (ref 36.0–46.0)
Hemoglobin: 11.1 g/dL — ABNORMAL LOW (ref 12.0–15.0)
MCH: 29.8 pg (ref 26.0–34.0)
MCHC: 34 g/dL (ref 30.0–36.0)
MCV: 87.6 fL (ref 78.0–100.0)
PLATELETS: 150 10*3/uL (ref 150–400)
RBC: 3.72 MIL/uL — ABNORMAL LOW (ref 3.87–5.11)
RDW: 13 % (ref 11.5–15.5)
WBC: 8.9 10*3/uL (ref 4.0–10.5)

## 2014-05-31 LAB — HEMOGLOBIN A1C
Hgb A1c MFr Bld: 13.2 % — ABNORMAL HIGH (ref <5.7)
MEAN PLASMA GLUCOSE: 332 mg/dL — AB (ref <117)

## 2014-05-31 MED ORDER — METFORMIN HCL 500 MG PO TABS: 500.0000 mg | ORAL_TABLET | Freq: Two times a day (BID) | ORAL | Status: DC

## 2014-05-31 MED ORDER — ATORVASTATIN CALCIUM 80 MG PO TABS: 80.0000 mg | ORAL_TABLET | Freq: Every day | ORAL | Status: DC

## 2014-05-31 MED ORDER — NITROGLYCERIN 0.4 MG SL SUBL: 0.4000 mg | SUBLINGUAL_TABLET | SUBLINGUAL | Status: DC | PRN

## 2014-05-31 MED ORDER — METOPROLOL TARTRATE 25 MG PO TABS: 12.5000 mg | ORAL_TABLET | Freq: Two times a day (BID) | ORAL | Status: DC

## 2014-05-31 MED ORDER — TICAGRELOR 90 MG PO TABS: 90.0000 mg | ORAL_TABLET | Freq: Two times a day (BID) | ORAL | Status: DC

## 2014-05-31 NOTE — Progress Notes (Signed)
    SUBJECTIVE:  No chest pain.  No SOB   PHYSICAL EXAM Filed Vitals:   05/30/14 1438 05/30/14 2206 05/30/14 2228 05/31/14 0501  BP: 95/57 80/43 92/54  110/62  Pulse: 96 101  91  Temp: 98.3 F (36.8 C) 98.9 F (37.2 C)  98.7 F (37.1 C)  TempSrc: Oral Oral  Oral  Resp: 18 18  18   Height:      Weight:    162 lb 14.7 oz (73.9 kg)  SpO2: 100% 99%  100%   General:  No distress Lungs:  Clear Heart:  RRR Abdomen:  Positive bowel sounds, no rebound no guarding Extremities:  No edema   LABS:  Results for orders placed or performed during the hospital encounter of 05/29/14 (from the past 24 hour(s))  Glucose, capillary     Status: Abnormal   Collection Time: 05/30/14 11:46 AM  Result Value Ref Range   Glucose-Capillary 334 (H) 70 - 99 mg/dL   Comment 1 Capillary Sample   Glucose, capillary     Status: Abnormal   Collection Time: 05/30/14  4:27 PM  Result Value Ref Range   Glucose-Capillary 107 (H) 70 - 99 mg/dL   Comment 1 Notify RN    Comment 2 Documented in Chart   Glucose, capillary     Status: Abnormal   Collection Time: 05/30/14 10:04 PM  Result Value Ref Range   Glucose-Capillary 335 (H) 70 - 99 mg/dL   Comment 1 Documented in Chart    Comment 2 Notify RN   CBC     Status: Abnormal   Collection Time: 05/31/14  4:55 AM  Result Value Ref Range   WBC 8.9 4.0 - 10.5 K/uL   RBC 3.72 (L) 3.87 - 5.11 MIL/uL   Hemoglobin 11.1 (L) 12.0 - 15.0 g/dL   HCT 16.132.6 (L) 09.636.0 - 04.546.0 %   MCV 87.6 78.0 - 100.0 fL   MCH 29.8 26.0 - 34.0 pg   MCHC 34.0 30.0 - 36.0 g/dL   RDW 40.913.0 81.111.5 - 91.415.5 %   Platelets 150 150 - 400 K/uL  Glucose, capillary     Status: Abnormal   Collection Time: 05/31/14  6:11 AM  Result Value Ref Range   Glucose-Capillary 294 (H) 70 - 99 mg/dL   Comment 1 Documented in Chart    Comment 2 Notify RN     Intake/Output Summary (Last 24 hours) at 05/31/14 0826 Last data filed at 05/30/14 0900  Gross per 24 hour  Intake   12.8 ml  Output      0 ml  Net    12.8 ml    ASSESSMENT AND PLAN:  NSTEMI:  PCI of circ and RCA with DES.    Awaiting echo then home possibly home today.   ANEMIA:  H/H stable improved.  Follow as an out patient.  TOBACCO:  Educated.   DYSLIPIDEMIA:  Home on high dose Lipitor.   DM:  Awaiting A1C.      Fayrene FearingJames Esec LLCochrein 05/31/2014 8:26 AM

## 2014-05-31 NOTE — Plan of Care (Signed)
Problem: Phase I Progression Outcomes Goal: Initial discharge plan identified Outcome: Completed/Met Date Met:  05/31/14 Home with family

## 2014-05-31 NOTE — Progress Notes (Signed)
CARDIAC REHAB PHASE I   PRE:  Rate/Rhythm: 98 SR  BP:  Supine: 108/70  Sitting:   Standing:    SaO2:   MODE:  Ambulation: 550 ft   POST:  Rate/Rhythm: 121 ST  BP:  Supine:   Sitting: 104/70  Standing:    SaO2:  0823-0850 Pt walked 550 ft with steady gait. No CP. More awake today and laughing. Wants to go home. Stated she has quit smoking and family has removed cigarettes from home. Reviewed written ex ed with pt who voiced understanding. Discussed CRP 2. Pt gave permission to refer to [place]. Gave financial form for program.   Luetta Nuttingharlene Kamren Heintzelman, RN BSN  05/31/2014 8:47 AM

## 2014-05-31 NOTE — Progress Notes (Signed)
Echocardiogram 2D Echocardiogram has been performed.  Dorothey BasemanReel, Shacora Zynda M 05/31/2014, 10:06 AM

## 2014-05-31 NOTE — Progress Notes (Signed)
Patient ready for discharge, Medications and discharge instructions reviewed with patient. Aware no work x 2 weeks, Fu appt for 2/3 scheduled. All questions answered. IV out, tele off-CCMD notified. Patient taken out to ride with family for discharge via w/c with belongings.

## 2014-05-31 NOTE — Progress Notes (Signed)
Inpatient Diabetes Program Recommendations  AACE/ADA: New Consensus Statement on Inpatient Glycemic Control (2013)  Target Ranges:  Prepandial:   less than 140 mg/dL      Peak postprandial:   less than 180 mg/dL (1-2 hours)      Critically ill patients:  140 - 180 mg/dL  LATE ENTRY: MET WITH PT 1/20  Reason for Visit: consult   Diabetes history: pt reports she first had gestational diabetes that did not "go away" after delivery; states she started taking oral antihyperglycemics but was then transitioned to insulin Outpatient Diabetes medications: reports Lantus 20 units and Metformin 500 BID  (has not taken in a "couple months" because lost Medicaid.   Current orders for Inpatient glycemic control: Lantus 10 (started today) and Novolog moderate scale TID  Inpatient Diabetes Program Recommendations Insulin - Basal: pt states she was supposed to be taking Lantus 20 units at home HgbA1C: pending   Note: pt is hopeful that her Medicaid will be reinstated and she will be able to continue taking Lantus and Metformin.  She was using insulin pens and is comfortable with administration. Diabetes Meal Planning Guide was given to patient.  No further questions/concerns at the end of our visit. Thank you  Raoul Pitch BSN, RN,CDE Inpatient Diabetes Coordinator [phone] (team pager)

## 2014-05-31 NOTE — Discharge Summary (Signed)
Physician Discharge Summary  Patient ID: [name] MRN: [id number] DOB/AGE: 1970/08/04 44 y.o.  Primary Cardiologist: Dr. Allyson Sabal  Admit date: 05/29/2014 Discharge date: 05/31/2014  Admission Diagnoses: NSTEMI  Discharge Diagnoses:  Active Problems:   NSTEMI (non-ST elevated myocardial infarction)   Discharged Condition: stable  Hospital Course: The patient is a 44 y/o female whose cardiac risk factors includes a + family history, DM, HTN and tobacco abuse, who presented to the [facility] ER on 05/29/14 with complaints of chest pain. EKG revealed inferior lateral ischemia and she ruled in for NSTEMI with positive cardiac enzymes. Troponins peaked at 19.73. She was placed on IV heparin and transferred to [facility] for further management. She underwent a LHC on 05/29/14. The procedure was performed by Dr. Eldridge Dace. She was found to have moderate disease in the mid to distal left anterior descending artery with patent diagonal branches. There was severe disease in the proximal left circumflex artery. This was successfully treated with PCI utilizing a DES. The culprit lesion however was felt to be and occluded mid circumflex. This was successfully treated with balloon angioplasty with a 2.0 x 12 balloon and subsequent restoration of TIMI-3 flow. The distal vessel and its branches were small and diffusely diseased, particularly at the terminal ends of the branches. There was also severe disease in the mid right coronary artery. This was successfully treated with PCI utilzing a DES. Left ventriculogram was not performed. She tolerated the procedure well and her CP resolved. She was started on DAPT with ASA + Brilinta, as well as atorvastatin, metoprolol and lisinopril. She had no recurrent CP and ambulated with cardiac rehab without difficulty. Her blood sugars were poorly controlled. Diabetes coordinator was consulted and coverage with insulin was recommended. Hgb A1c was checked and was severely elevated at  13.2. A lipid panel demonstrated HLD with and elevated LDL of 125.  A 2D echo was obtained prior to discharge. This demonstrated normal LV function with EF of 55-60%.  She was last seen and examined by Dr. Antoine Poche who determined she was stable for discharge home. Post hospital f/u has been arranged with Robbie Lis, PA-C, on 06/13/14. She will be followed long term by Dr. Allyson Sabal. She was advised to f/u with her PCP for further management/ better control of her DM.     Consults: None  Significant Diagnostic Studies:  Cardiac Panel (last 3 results)  Recent Labs  05/29/14 2140 05/29/14 2347 05/30/14 0600  CKTOTAL 637* 576* 426*  CKMB 32.0* 29.5* 15.4*  TROPONINI 19.73* 15.99* 10.85*  RELINDX 5.0* 5.1* 3.6*   Lipid Panel     Component Value Date/Time   CHOL 195 05/30/2014 0600   TRIG 169* 05/30/2014 0600   HDL 36* 05/30/2014 0600   CHOLHDL 5.4 05/30/2014 0600   VLDL 34 05/30/2014 0600   LDLCALC 125* 05/30/2014 0600   LHC 05/29/13 IMPRESSIONS:  1. Patent left main coronary artery. 2. 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 Patent diagonal branches. 3. Severe disease in the proximal left circumflex artery. This was successfully treated with a 2.25 x 12 Synergy drug-eluting stent, postdilated to 2.3 mm in diameter. Possible culprit for today's presentation was the occluded mid circumflex. This was successfully treated with balloon angioplasty with a 2.0 x 12 balloon and subsequent restoration of TIMI-3 flow. The distal vessel and its branches are small and diffusely diseased, particularly at the terminal ends of the branches. 4. Severe disease in the mid right coronary artery. This was successfully  treated with a 2.25 x 16 Synergy drug-eluting stent, postdilated to 2.3 mm in diameter.  5. LVEDP 13 mmHg. Ejection fraction be assessed with echocardiogram . 6. suspect that she had 2 culprit vessels for this MI. The occlusion in the mid circumflex  and the occlusion in her small PDA were both cross fairly easily indicating that they were new occlusions and likely culprit vessels.   2D echo 05/31/14 Study Conclusions  - Left ventricle: The cavity size was normal. Wall thickness was normal. Systolic function was normal. The estimated ejection fraction was in the range of 55% to 60%.    Treatments: See Hospital Course  Discharge Exam: Blood pressure 98/60, pulse 97, temperature 98.7 F (37.1 C), temperature source Oral, resp. rate 18, height 5\' 3"  (1.6 m), weight 162 lb 14.7 oz (73.9 kg), SpO2 100 %.  Disposition: 01-Home or Self Care      Discharge Instructions    Amb Referral to Cardiac Rehabilitation    Complete by:  As directed             Medication List    TAKE these medications        aspirin 81 MG tablet  Take 81 mg by mouth daily.     atorvastatin 80 MG tablet  Commonly known as:  LIPITOR  Take 1 tablet (80 mg total) by mouth daily at 6 PM.     lisinopril 10 MG tablet  Commonly known as:  PRINIVIL,ZESTRIL  Take 1 tablet (10 mg total) by mouth daily.     metFORMIN 500 MG tablet  Commonly known as:  GLUCOPHAGE  Take 1 tablet (500 mg total) by mouth 2 (two) times daily with a meal.  Start taking on:  06/02/2014     metoprolol tartrate 25 MG tablet  Commonly known as:  LOPRESSOR  Take 0.5 tablets (12.5 mg total) by mouth 2 (two) times daily.     nitroGLYCERIN 0.4 MG SL tablet  Commonly known as:  NITROSTAT  Place 1 tablet (0.4 mg total) under the tongue every 5 (five) minutes x 3 doses as needed for chest pain.     polyethylene glycol packet  Commonly known as:  MIRALAX / GLYCOLAX  Take 17 g by mouth daily. Mix with 8 oz of water and drink daily x 7 days     ticagrelor 90 MG Tabs tablet  Commonly known as:  BRILINTA  Take 1 tablet (90 mg total) by mouth 2 (two) times daily.     ticagrelor 90 MG Tabs tablet  Commonly known as:  BRILINTA  Take 1 tablet (90 mg total) by mouth 2 (two) times  daily.        TIME SPENT ON DISCHARGE, INCLUDING PHYSICIAN TIME: >30 MINUTES Follow-up Information    Follow up with Robbie LisSIMMONS, Kalesha Irving, PA-C On 06/13/2014.   Specialty:  Cardiology   Why:  1:30 (Dr. Hazle CocaBerry's PA)   Contact information:   [address] [place] [address] [phone]       Signed: Robbie LisSIMMONS, Fumiko Cham 05/31/2014, 4:52 PM

## 2014-06-04 ENCOUNTER — Telehealth: Payer: Self-pay | Admitting: Cardiovascular Disease

## 2014-06-04 NOTE — Telephone Encounter (Signed)
Returned call to patient she stated someone already faxed note to her work.

## 2014-06-04 NOTE — Telephone Encounter (Signed)
Pt says she need a note for work stating that she can not return to work until she has appointment with Dr Allyson SabalBerry.Please fax to [phone] AVW:[id number]:Sylvia

## 2014-06-13 ENCOUNTER — Encounter: Payer: Self-pay | Admitting: Cardiology

## 2014-06-13 ENCOUNTER — Ambulatory Visit (INDEPENDENT_AMBULATORY_CARE_PROVIDER_SITE_OTHER): Payer: Self-pay | Admitting: Cardiology

## 2014-06-13 VITALS — BP 132/74 | HR 84 | Ht 63.0 in | Wt 164.9 lb

## 2014-06-13 DIAGNOSIS — E785 Hyperlipidemia, unspecified: Secondary | ICD-10-CM

## 2014-06-13 DIAGNOSIS — Z79899 Other long term (current) drug therapy: Secondary | ICD-10-CM

## 2014-06-13 DIAGNOSIS — I214 Non-ST elevation (NSTEMI) myocardial infarction: Secondary | ICD-10-CM

## 2014-06-13 LAB — CK: Total CK: 43 U/L (ref 7–177)

## 2014-06-13 NOTE — Progress Notes (Signed)
06/13/2014 [name]   [birth year]  [id number]  Primary Physician Dorrene GermanAVBUERE,EDWIN A, MD Primary Cardiologist: Dr. Allyson SabalBerry  Reason for Visit/CC: Post hospital Follow-up for CAD  HPI:  Ms. [name] presents to clinic today for post hospital follow-up. Her medical history includes newly diagnosed CAD, strong family history for coronary artery disease, poorly controlled  Type 2 diabetes mellitus, hypertension and tobacco abuse. She was [facility] to [facility] 05/29/2014 for chest pain. Cardiac enzymes were cycled and she ruled in for non-ST elevation MI. Troponins peaked at 19.73. LHC was performed by Dr. Eldridge DaceVaranasi. This demonstrated moderate disease in the mid to distal left anterior descending artery with patent diagonal branches. There was severe disease in the proximal left circumflex artery. This was successfully treated with PCI utilizing a DES. The culprit lesion however was felt to be an occluded mid circumflex. This was successfully treated with balloon angioplasty with a 2.0 x 12 balloon and subsequent restoration of TIMI-3 flow. The distal vessel and its branches were small and diffusely diseased, particularly at the terminal ends of the branches. There was also severe disease in the mid right coronary artery. This was successfully treated with PCI utilzing a DES. She was noted to have preserved LV function with an ejection fraction of 55-60%. She was placed on dual antiplatelet therapy with aspirin plus Brilinta as well as beta blocker therapy, statin therapy and ACE inhibitor therapy. It should also be noted that her blood sugars were poorly controlled. Hgb A1c was checked and was severely elevated at 13.2. Diabetes coordinator was consulted and coverage with insulin was recommended. She was also continued on metformin therapy. Also of note, a lipid panel demonstrated an HLD with and elevated LDL of 125.  She presents to clinic today for post hospital follow-up. Since discharge from  hospital she reports that she has done fairly well. She denies any recurrent chest pain. She reports full medication compliance. She denies any abnormal bleeding with dual antiplatelet therapy. Unfortunately she continues to smoke but she notes that she has cut back significantly. She now only smokes on average 2 cigarettes per day. She reports that she has not yet followed up with her primary care provider regarding her diabetes.     Current Outpatient Prescriptions  Medication Sig Dispense Refill  . aspirin 81 MG tablet Take 81 mg by mouth daily.    Marland Kitchen. atorvastatin (LIPITOR) 80 MG tablet Take 1 tablet (80 mg total) by mouth daily at 6 PM. 30 tablet 5  . lisinopril (PRINIVIL,ZESTRIL) 10 MG tablet Take 1 tablet (10 mg total) by mouth daily. 30 tablet 0  . metFORMIN (GLUCOPHAGE) 500 MG tablet Take 1 tablet (500 mg total) by mouth 2 (two) times daily with a meal. 60 tablet 0  . metoprolol tartrate (LOPRESSOR) 25 MG tablet Take 0.5 tablets (12.5 mg total) by mouth 2 (two) times daily. 60 tablet 5  . polyethylene glycol (MIRALAX / GLYCOLAX) packet Take 17 g by mouth daily. Mix with 8 oz of water and drink daily x 7 days 14 each 0  . ticagrelor (BRILINTA) 90 MG TABS tablet Take 1 tablet (90 mg total) by mouth 2 (two) times daily. 60 tablet 10   No current facility-administered medications for this visit.    No Known Allergies  History   Social History  . Marital Status: Single    Spouse Name: N/A    Number of Children: N/A  . Years of Education: N/A   Occupational History  .  Not on file.   Social History Main Topics  . Smoking status: Current Every Day Smoker -- 0.50 packs/day    Types: Cigarettes  . Smokeless tobacco: Not on file  . Alcohol Use: No  . Drug Use: No  . Sexual Activity: Not on file   Other Topics Concern  . Not on file   Social History Narrative     Review of Systems: General: negative for chills, fever, night sweats or weight changes.  Cardiovascular: negative  for chest pain, dyspnea on exertion, edema, orthopnea, palpitations, paroxysmal nocturnal dyspnea or shortness of breath Dermatological: negative for rash Respiratory: negative for cough or wheezing Urologic: negative for hematuria Abdominal: negative for nausea, vomiting, diarrhea, bright red blood per rectum, melena, or hematemesis Neurologic: negative for visual changes, syncope, or dizziness All other systems reviewed and are otherwise negative except as noted above.    Blood pressure 132/74, pulse 84, height  (1.6 m), weight 164 lb 14.4 oz (74.798 kg).  General appearance: alert, cooperative and no distress Neck: no carotid bruit and no JVD Lungs: clear to auscultation bilaterally Heart: regular rate and rhythm, S1, S2 normal, 1/6 systolic murmur at LUSB, no click, rub or gallop Extremities: no LEE Pulses: 2+ and symmetric Skin: warm and dry Neurologic: Grossly normal  EKG NSR 84 bpm  ASSESSMENT AND PLAN:   1. CAD/non-ST elevation myocardial infarction: Status post PCI plus drug-eluting stenting as outlined above. No recurrent chest pain. Continue dual antiplatelet therapy with aspirin plus Brilinta for a minimum of one year given newly placed drug-eluting stents. Continue high-dose statin therapy along with beta blocker and ACE inhibitor. We discussed the importance of lifestyle modifications/risk factor reduction including better control of her diabetes, cholesterol, smoking cessation and improved diet and exercise.  2. Diabetes: Poorly controlled with hemoglobin A1c of 13.2. Continue therapy with insulin and metformin. Patient strongly advised to follow-up with her PCP soon for better management.  3. Tobacco abuse: Smoking cessation was strongly encouraged. She was educated on the association between tobacco use and increase in cardiovascular events.  4. Hypertension: Well-controlled at 130/74. Continue current medical regimen.  5. Hyperlipidemia: Lipid panel demonstrated  elevated LDL of 124. Her goal in the setting of known CAD is less than 70. Continue high-dose statin therapy with Lipitor. Will recheck lipid panel and LFTs in 6 weeks.  6. Elevated creatine kinase levels: This was noted during recent hospitalization. She was discharged on high-dose statin therapy. She denies myalgias. Will reassess levels today.  Will notify patient by phone if any medication adjustments are needed.   PLAN  follow-up with Dr. Allyson Sabal in 6 weeks for repeat evaluation.  SIMMONS, BRITTAINYPA-C 06/13/2014 2:11 PM

## 2014-06-13 NOTE — Patient Instructions (Addendum)
Your physician recommends that you schedule a follow-up appointment in: 6-8 Weeks with Dr Allyson SabalBerry  Your physician recommends that you return for lab work Today CK and CMP and Fasting lipids in 6 weeks  Your physician has recommended you make the following change in your medication: Increase Metoprolol to 25 mg twice a day

## 2014-06-21 ENCOUNTER — Telehealth: Payer: Self-pay | Admitting: Cardiovascular Disease

## 2014-06-21 NOTE — Telephone Encounter (Signed)
Pt can not afford the Brilinta. What else can she take?

## 2014-06-21 NOTE — Telephone Encounter (Signed)
Card  Up front for pt. To pick up

## 2014-06-27 ENCOUNTER — Telehealth: Payer: Self-pay | Admitting: *Deleted

## 2014-06-27 MED ORDER — TICAGRELOR 90 MG PO TABS: 90.0000 mg | ORAL_TABLET | Freq: Two times a day (BID) | ORAL | Status: DC

## 2014-06-27 NOTE — Telephone Encounter (Signed)
Patient walked in needs samples of BRilinta She states she received samples and 30 day free supply from hospital . She filled out forms for [employer] assistance.She has not heard from the company  RN INFORMED PATIENT TO CONTACT company to see what the status . Contact office if company will not supply medication She verbalized understanding.

## 2014-07-10 ENCOUNTER — Telehealth: Payer: Self-pay | Admitting: Cardiovascular Disease

## 2014-07-10 NOTE — Telephone Encounter (Signed)
[name] is calling because she is on Brilinta and was told she does not qualify for financial assistance and would like to know is there something less expensive . Please call   Thanks

## 2014-07-10 NOTE — Telephone Encounter (Signed)
Returned call to patient no answer.Left message on personal voice mail will [facility] advice.

## 2014-07-13 NOTE — Telephone Encounter (Signed)
Needs return office visit with mid-level provider

## 2014-07-13 NOTE — Telephone Encounter (Signed)
Please call,pt is still waiting to hear about her medicine.

## 2014-07-16 NOTE — Telephone Encounter (Signed)
Returned call to patient Brilinta 90 mg samples left at [employer] front desk.Appointment scheduled with Nada BoozerLaura Ingold NP 07/31/14 at 4:00 pm.

## 2014-07-18 ENCOUNTER — Telehealth: Payer: Self-pay | Admitting: Cardiovascular Disease

## 2014-07-19 NOTE — Telephone Encounter (Signed)
Closed encounter °

## 2014-07-31 ENCOUNTER — Ambulatory Visit (INDEPENDENT_AMBULATORY_CARE_PROVIDER_SITE_OTHER): Payer: Self-pay | Admitting: Cardiology

## 2014-07-31 VITALS — BP 122/80 | HR 88 | Ht 63.0 in | Wt 165.7 lb

## 2014-07-31 DIAGNOSIS — Z720 Tobacco use: Secondary | ICD-10-CM

## 2014-07-31 DIAGNOSIS — E785 Hyperlipidemia, unspecified: Secondary | ICD-10-CM

## 2014-07-31 DIAGNOSIS — I214 Non-ST elevation (NSTEMI) myocardial infarction: Secondary | ICD-10-CM

## 2014-07-31 MED ORDER — NITROGLYCERIN 0.4 MG SL SUBL: 0.4000 mg | SUBLINGUAL_TABLET | SUBLINGUAL | Status: DC | PRN

## 2014-07-31 NOTE — Patient Instructions (Signed)
Your physician recommends that you schedule a follow-up appointment in: 4 months with Dr. Allyson SabalBerry.

## 2014-07-31 NOTE — Progress Notes (Signed)
Cardiology Office Note   Date:  08/01/2014   ID:  [name], DOB 1971/04/23, MRN [id number]  PCP:  Dorrene German, MD  Cardiologist:  Dr. Allyson Sabal    Chief Complaint  Patient presents with  . ROV    patient states that the Brilinta is too exspensive. does not qualify for patient assistance.      History of Present Illness: [name] is a 44 y.o. female who presents for follow up after NSTEMI in Jan 2016.    Recent NSTEMI with PCI with DES LCX.  Poorly controlled DM2, HLD, tobacco use. She continues smoking though cutting back.  Recent labs reviewed.  Her glucose continues to be poorly controlled -she admits to forgetting her insulin at night.  We had long discussion about DM and effect on kidneys, heart and brain and her elevated TG.   She is not exercising, but is active at work.  Discussed buying pedometer and to walk 10,000 steps daily.  She has no chest pain, occ at rest feels SOB and takes a few deep breaths and gets better, this may be her Brilinta.  It is not too bothersome for her.  She is concerned about the cost of the brilinta.  She does not fit low income provision.  Our rep will help [place] provide. She was given 1 month of samples daily.   She is back at work and not having any problems.     Past Medical History  Diagnosis Date  . Hypertension   . Diabetes mellitus without complication     Past Surgical History  Procedure Laterality Date  . Left heart catheterization with coronary angiogram N/A 05/29/2014    Procedure: LEFT HEART CATHETERIZATION WITH CORONARY ANGIOGRAM;  Surgeon: Corky Crafts, MD;  Location: [facility] CATH LAB;  Service: Cardiovascular;  Laterality: N/A;  . Percutaneous coronary stent intervention (pci-s)  05/29/2014    Procedure: PERCUTANEOUS CORONARY STENT INTERVENTION (PCI-S);  Surgeon: Corky Crafts, MD;  Location: [facility] CATH LAB;  Service: Cardiovascular;;     Current Outpatient Prescriptions  Medication Sig Dispense Refill    . aspirin 81 MG tablet Take 81 mg by mouth daily.    Marland Kitchen atorvastatin (LIPITOR) 80 MG tablet Take 1 tablet (80 mg total) by mouth daily at 6 PM. 30 tablet 5  . insulin glargine (LANTUS) 100 UNIT/ML injection Inject 10 Units into the skin at bedtime.    Marland Kitchen lisinopril (PRINIVIL,ZESTRIL) 10 MG tablet Take 1 tablet (10 mg total) by mouth daily. 30 tablet 0  . metFORMIN (GLUCOPHAGE) 500 MG tablet Take 1 tablet (500 mg total) by mouth 2 (two) times daily with a meal. 60 tablet 0  . metoprolol tartrate (LOPRESSOR) 25 MG tablet Take 25 mg by mouth 2 (two) times daily.    . ticagrelor (BRILINTA) 90 MG TABS tablet Take 1 tablet (90 mg total) by mouth 2 (two) times daily. 24 tablet 0  . nitroGLYCERIN (NITROSTAT) 0.4 MG SL tablet Place 1 tablet (0.4 mg total) under the tongue every 5 (five) minutes as needed for chest pain. 30 tablet 6   No current facility-administered medications for this visit.    Allergies:   Review of patient's allergies indicates no known allergies.    Social History:  The patient  reports that she has been smoking Cigarettes.  She has been smoking about 0.50 packs per day. She does not have any smokeless tobacco history on file. She reports that she does not drink alcohol or use illicit  drugs.   Family History:  The patient's family history includes Heart attack in her father; Heart disease in her father; Hypertension in her mother; Kidney failure in her mother.    ROS:  General:no colds or fevers, no weight changes Skin:no rashes or ulcers HEENT:no blurred vision, no congestion CV:see HPI PUL:see HPI GI:no diarrhea constipation or melena, no indigestion GU:no hematuria, no dysuria MS:no joint pain, no claudication Neuro:no syncope, no lightheadedness Endo:+ diabetes not well controlled, no thyroid disease  Wt Readings from Last 3 Encounters:  07/31/14 165 lb 11.2 oz (75.161 kg)  06/13/14 164 lb 14.4 oz (74.798 kg)  05/31/14 162 lb 14.7 oz (73.9 kg)     PHYSICAL  EXAM: VS:  BP 122/80 mmHg  Pulse 88  Ht  (1.6 m)  Wt 165 lb 11.2 oz (75.161 kg)  BMI 29.36 kg/m2 , BMI Body mass index is 29.36 kg/(m^2). General:Pleasant affect, NAD Skin:Warm and dry, brisk capillary refill HEENT:normocephalic, sclera clear, mucus membranes moist Neck:supple, no JVD, no bruits  Heart:S1S2 RRR without murmur, gallup, rub or click Lungs:clear without rales, rhonchi, or wheezes ZOX:WRUE, non tender, + BS, do not palpate liver spleen or masses Ext:no lower ext edema, 2+ pedal pulses, 2+ radial pulses Neuro:alert and oriented x 3, MAE, follows commands, + facial symmetry    EKG:  EKG is not ordered today.    Recent Labs: 05/30/2014: ALT 17; BUN 9; Creatinine 0.66; Potassium 3.8; Sodium 133* 05/31/2014: Hemoglobin 11.1*; Platelets 150    Lipid Panel    Component Value Date/Time   CHOL 195 05/30/2014 0600   TRIG 169* 05/30/2014 0600   HDL 36* 05/30/2014 0600   CHOLHDL 5.4 05/30/2014 0600   VLDL 34 05/30/2014 0600   LDLCALC 125* 05/30/2014 0600    0n 07/25/14 Tchol 230, TG 451, LDL not calculated.    Other studies Reviewed: Additional studies/ records that were reviewed today include: hospital notes, cath and labs.   ASSESSMENT AND PLAN:  1. CAD/non-ST elevation myocardial infarction: Status post PCI plus drug-eluting stenting as outlined above. No recurrent chest pain. Continue dual antiplatelet therapy with aspirin plus Brilinta for a minimum of one year given newly placed drug-eluting stents. We will provide samples.  Continue high-dose statin therapy along with beta blocker and ACE inhibitor. We discussed the importance of lifestyle modifications/risk factor reduction including better control of her diabetes, cholesterol, smoking cessation and improved diet and exercise.  Follow up with Dr. Erlene Quan in 4 months.  2. Diabetes: Poorly controlled with hemoglobin A1c of 13.2. Continue therapy with insulin and metformin. Patient saw PCP last week.  She  still is not impressed with importance of controlling her DM.  3. Tobacco abuse: Smoking cessation was strongly encouraged. She was educated on the association between tobacco use and increase in cardiovascular events.  4. Hypertension: Well-controlled at 122/80. Continue current medical regimen.  5. Hyperlipidemia: Lipid panel demonstrated elevated LDL of 124. Her goal in the setting of known CAD is less than 70. Continue high-dose statin therapy with Lipitor. Now TG elevated. Continue current meds, control diabetes.  6. Elevated creatine kinase levels: This was noted during recent hospitalization. She was discharged on high-dose statin therapy. Stable Cr. No myalgias   Current medicines are reviewed with the patient today.  The patient Has no concerns regarding medicines.  The following changes have been made:  See above Labs/ tests ordered today include:see above  Disposition:   FU:  see above  Signed, INGOLD,LAURA R, NP  08/01/2014 4:09  PM    [facility] [address], [place], [place]  [address]/ [address] [place], [place] Phone: [phone]; Fax: [phone]  [phone]

## 2014-08-01 ENCOUNTER — Encounter: Payer: Self-pay | Admitting: Cardiology

## 2014-08-02 ENCOUNTER — Encounter: Payer: Self-pay | Admitting: Cardiovascular Disease

## 2014-08-07 ENCOUNTER — Encounter: Payer: Self-pay | Admitting: Cardiovascular Disease

## 2014-08-14 ENCOUNTER — Ambulatory Visit: Payer: Self-pay | Admitting: Cardiovascular Disease

## 2014-08-16 ENCOUNTER — Telehealth: Payer: Self-pay | Admitting: *Deleted

## 2014-08-16 MED ORDER — TICAGRELOR 90 MG PO TABS: 90.0000 mg | ORAL_TABLET | Freq: Two times a day (BID) | ORAL | Status: DC

## 2014-08-16 NOTE — Telephone Encounter (Signed)
Samples of Brilinta are ready for pick up

## 2014-09-04 ENCOUNTER — Telehealth: Payer: Self-pay | Admitting: Cardiovascular Disease

## 2014-09-04 NOTE — Telephone Encounter (Signed)
Pt been having chest pain for the past 3 days.She would like to be seen today by somebody please.

## 2014-09-04 NOTE — Telephone Encounter (Signed)
Spoke to pt, c/o CP x3 days. She states "chest is on fire".  She states typically 30-60 mins in duration - has dyspnea w/ this, but denies nausea, fatigue, other obvs symptoms.   Notes not taking any new meds, has not tried meds for acute issue.  Advised if CP returns, --> ED for acute symptoms - pt acknowledges having NTG on-hand. I stated she should elect to try NTG to see if symptoms relieved w/ this.  Will route message to see if pt can be worked in on [employer] calendar at [address]  She voiced understanding & agreement w plan.

## 2014-09-06 ENCOUNTER — Encounter (HOSPITAL_COMMUNITY): Payer: Self-pay | Admitting: Emergency Medicine

## 2014-09-06 ENCOUNTER — Emergency Department (HOSPITAL_COMMUNITY): Payer: Medicaid Other

## 2014-09-06 ENCOUNTER — Emergency Department (HOSPITAL_COMMUNITY)
Admission: EM | Admit: 2014-09-06 | Discharge: 2014-09-06 | Discharge status: Against Medical Advice | Payer: Self-pay | Attending: Emergency Medicine | Admitting: Emergency Medicine

## 2014-09-06 ENCOUNTER — Emergency Department (INDEPENDENT_AMBULATORY_CARE_PROVIDER_SITE_OTHER)
Admission: EM | Admit: 2014-09-06 | Discharge: 2014-09-06 | Disposition: A | Discharge status: Other Acute Inpatient Hospital | Payer: Self-pay | Source: Home / Self Care | Attending: Emergency Medicine | Admitting: Emergency Medicine

## 2014-09-06 ENCOUNTER — Encounter (HOSPITAL_COMMUNITY): Payer: Self-pay | Admitting: *Deleted

## 2014-09-06 DIAGNOSIS — E119 Type 2 diabetes mellitus without complications: Secondary | ICD-10-CM | POA: Insufficient documentation

## 2014-09-06 DIAGNOSIS — Z7982 Long term (current) use of aspirin: Secondary | ICD-10-CM | POA: Insufficient documentation

## 2014-09-06 DIAGNOSIS — I10 Essential (primary) hypertension: Secondary | ICD-10-CM | POA: Insufficient documentation

## 2014-09-06 DIAGNOSIS — Z9861 Coronary angioplasty status: Secondary | ICD-10-CM | POA: Insufficient documentation

## 2014-09-06 DIAGNOSIS — E785 Hyperlipidemia, unspecified: Secondary | ICD-10-CM | POA: Insufficient documentation

## 2014-09-06 DIAGNOSIS — Z720 Tobacco use: Secondary | ICD-10-CM | POA: Insufficient documentation

## 2014-09-06 DIAGNOSIS — R079 Chest pain, unspecified: Secondary | ICD-10-CM

## 2014-09-06 DIAGNOSIS — R9431 Abnormal electrocardiogram [ECG] [EKG]: Secondary | ICD-10-CM

## 2014-09-06 DIAGNOSIS — R7989 Other specified abnormal findings of blood chemistry: Secondary | ICD-10-CM | POA: Insufficient documentation

## 2014-09-06 DIAGNOSIS — Z9889 Other specified postprocedural states: Secondary | ICD-10-CM | POA: Insufficient documentation

## 2014-09-06 DIAGNOSIS — Z79899 Other long term (current) drug therapy: Secondary | ICD-10-CM | POA: Insufficient documentation

## 2014-09-06 DIAGNOSIS — I214 Non-ST elevation (NSTEMI) myocardial infarction: Secondary | ICD-10-CM | POA: Insufficient documentation

## 2014-09-06 HISTORY — DX: Acute myocardial infarction, unspecified: I21.9

## 2014-09-06 LAB — COMPREHENSIVE METABOLIC PANEL
ALT: 14 U/L (ref 0–35)
AST: 15 U/L (ref 0–37)
Albumin: 3.7 g/dL (ref 3.5–5.2)
Alkaline Phosphatase: 66 U/L (ref 39–117)
Anion gap: 9 (ref 5–15)
BUN: 22 mg/dL (ref 6–23)
CALCIUM: 8.9 mg/dL (ref 8.4–10.5)
CHLORIDE: 97 mmol/L (ref 96–112)
CO2: 24 mmol/L (ref 19–32)
Creatinine, Ser: 0.98 mg/dL (ref 0.50–1.10)
GFR calc Af Amer: 81 mL/min — ABNORMAL LOW (ref >90)
GFR, EST NON AFRICAN AMERICAN: 70 mL/min — AB (ref >90)
Glucose, Bld: 359 mg/dL — ABNORMAL HIGH (ref 70–99)
POTASSIUM: 4.3 mmol/L (ref 3.5–5.1)
SODIUM: 130 mmol/L — AB (ref 135–145)
Total Bilirubin: 0.5 mg/dL (ref 0.3–1.2)
Total Protein: 6.5 g/dL (ref 6.0–8.3)

## 2014-09-06 LAB — CBC WITH DIFFERENTIAL/PLATELET
Basophils Absolute: 0 10*3/uL (ref 0.0–0.1)
Basophils Relative: 1 % (ref 0–1)
EOS ABS: 0.1 10*3/uL (ref 0.0–0.7)
Eosinophils Relative: 2 % (ref 0–5)
HCT: 37.8 % (ref 36.0–46.0)
Hemoglobin: 13.2 g/dL (ref 12.0–15.0)
Lymphocytes Relative: 32 % (ref 12–46)
Lymphs Abs: 2.6 10*3/uL (ref 0.7–4.0)
MCH: 29.7 pg (ref 26.0–34.0)
MCHC: 34.9 g/dL (ref 30.0–36.0)
MCV: 84.9 fL (ref 78.0–100.0)
MONOS PCT: 4 % (ref 3–12)
Monocytes Absolute: 0.3 10*3/uL (ref 0.1–1.0)
NEUTROS ABS: 4.8 10*3/uL (ref 1.7–7.7)
NEUTROS PCT: 61 % (ref 43–77)
PLATELETS: 145 10*3/uL — AB (ref 150–400)
RBC: 4.45 MIL/uL (ref 3.87–5.11)
RDW: 13.1 % (ref 11.5–15.5)
WBC: 7.9 10*3/uL (ref 4.0–10.5)

## 2014-09-06 LAB — TROPONIN I: Troponin I: 0.08 ng/mL — ABNORMAL HIGH (ref <0.031)

## 2014-09-06 LAB — LIPASE, BLOOD: Lipase: 28 U/L (ref 11–59)

## 2014-09-06 MED ORDER — NITROGLYCERIN 0.4 MG SL SUBL
0.4000 mg | SUBLINGUAL_TABLET | SUBLINGUAL | Status: DC | PRN
  Administered 2014-09-06: 0.4 mg via SUBLINGUAL

## 2014-09-06 MED ORDER — NITROGLYCERIN 0.4 MG SL SUBL
SUBLINGUAL_TABLET | SUBLINGUAL | Status: AC
  Filled 2014-09-06: qty 1

## 2014-09-06 MED ORDER — SODIUM CHLORIDE 0.9 % IV SOLN
INTRAVENOUS | Status: DC
  Administered 2014-09-06: 17:00:00 via INTRAVENOUS

## 2014-09-06 MED ORDER — ASPIRIN 81 MG PO CHEW
CHEWABLE_TABLET | ORAL | Status: AC
  Filled 2014-09-06: qty 3

## 2014-09-06 MED ORDER — ASPIRIN 81 MG PO CHEW
243.0000 mg | CHEWABLE_TABLET | Freq: Once | ORAL | Status: AC
  Administered 2014-09-06: 243 mg via ORAL

## 2014-09-06 MED ORDER — SODIUM CHLORIDE 0.9 % IV SOLN
INTRAVENOUS | Status: DC
  Administered 2014-09-06: 16:00:00 via INTRAVENOUS

## 2014-09-06 NOTE — ED Notes (Signed)
Pt reports  Reports  Chest     Pain  X  3  Days  With  intermittant  releif       From   Nitro     At  This  Time   Has  A  Burning  Sensation in  Chest  For  5  Hours          Last  Nitro  yest 8  Pm      1  Baby asa   this  am    Pain  Is    Is   3      At this  Time

## 2014-09-06 NOTE — ED Notes (Addendum)
Pt to ED via [facility] from Urgent Care ref. Chest pain.  Pt st's she started having central chest pain on Mon. Evening.  St's pain radiated through to back and caused right arm to ache. Pt also st's pain in back and right arm subsided last pm but continued to have uncomfortable pain in  Chest this am.  Pt was given NTG and ASA at Urgent Care before coming to ED.  Pt denies any chest pain or other complaints at this time

## 2014-09-06 NOTE — ED Provider Notes (Signed)
CSN: [id number]     Arrival date & time 09/06/14  1623 History   First MD Initiated Contact with Patient 09/06/14 1627     Chief Complaint  Patient presents with  . Chest Pain     (Consider location/radiation/quality/duration/timing/severity/associated sxs/prior Treatment) HPI Comments: Patient here complaining of chest pain which began 3 days ago. Patient has a history of MI 3 months ago and has a coronary artery stent. Pain has been at rest and is relieved with nitroglycerin. She has had 3 episodes of this. Pain is localized substernally going through to her back. This is similar to her anginal equivalent. Denies any syncope or near syncope. No palpitations. No leg pain or swelling. Pain is nonpleuritic. She is pain-free currently at this time. Went to urgent care and sent here for further evaluation  Patient is a 44 y.o. female presenting with chest pain. The history is provided by the patient.  Chest Pain   Past Medical History  Diagnosis Date  . Hypertension   . Diabetes mellitus without complication   . MI (myocardial infarction)    Past Surgical History  Procedure Laterality Date  . Left heart catheterization with coronary angiogram N/A 05/29/2014    Procedure: LEFT HEART CATHETERIZATION WITH CORONARY ANGIOGRAM;  Surgeon: Corky Crafts, MD;  Location[facility] C[facility] LAB;  Service: Cardiovascular;  Laterality: N/A;  . Percutaneous coronary stent intervention (pci-s)  05/29/2014    Procedure: PERCUTANEOUS CORONARY STENT INTERVENTION (PCI-S);  Surgeon: Corky Crafts, MD;  Location: [facility] CATH LAB;  Service: Cardiovascular;;   Family History  Problem Relation Age of Onset  . Hypertension Mother   . Kidney failure Mother   . Heart attack Father   . Heart disease Father    History  Substance Use Topics  . Smoking status: Current Every Day Smoker -- 0.50 packs/day    Types: Cigarettes  . Smokeless tobacco: Not on file  . Alcohol Use: No   OB History    No data available     Review of Systems  Cardiovascular: Positive for chest pain.  All other systems reviewed and are negative.     Allergies  Review of patient's allergies indicates no known allergies.  Home Medications   Prior to Admission medications   Medication Sig Start Date End Date Taking? Authorizing Provider  aspirin 81 MG tablet Take 81 mg by mouth daily.    Historical Provider, MD  atorvastatin (LIPITOR) 80 MG tablet Take 1 tablet (80 mg total) by mouth daily at 6 PM. 05/31/14   Brittainy M Sharol Harness, PA-C  insulin glargine (LANTUS) 100 UNIT/ML injection Inject 10 Units into the skin at bedtime.    Historical Provider, MD  lisinopril (PRINIVIL,ZESTRIL) 10 MG tablet Take 1 tablet (10 mg total) by mouth daily. 03/11/14   Ria Clock, PA  metFORMIN (GLUCOPHAGE) 500 MG tablet Take 1 tablet (500 mg total) by mouth 2 (two) times daily with a meal. 06/02/14   Brittainy Sherlynn Carbon, PA-C  metoprolol tartrate (LOPRESSOR) 25 MG tablet Take 25 mg by mouth 2 (two) times daily.    Historical Provider, MD  nitroGLYCERIN (NITROSTAT) 0.4 MG SL tablet Place 1 tablet (0.4 mg total) under the tongue every 5 (five) minutes as needed for chest pain. 07/31/14   Leone Brand, NP  ticagrelor (BRILINTA) 90 MG TABS tablet Take 1 tablet (90 mg total) by mouth 2 (two) times daily. 08/16/14   Runell Gess, MD   BP 105/69 mmHg  Pulse 81  Temp(Src) 98.1 F (36.7 C) (Oral)  Resp 14  SpO2 100% Physical Exam  Constitutional: She is oriented to person, place, and time. She appears well-developed and well-nourished.  Non-toxic appearance. No distress.  HENT:  Head: Normocephalic and atraumatic.  Eyes: Conjunctivae, EOM and lids are normal. Pupils are equal, round, and reactive to light.  Neck: Normal range of motion. Neck supple. No tracheal deviation present. No thyroid mass present.  Cardiovascular: Normal rate, regular rhythm and normal heart sounds.  Exam reveals no gallop.   No murmur heard. Pulmonary/Chest:  Effort normal and breath sounds normal. No stridor. No respiratory distress. She has no decreased breath sounds. She has no wheezes. She has no rhonchi. She has no rales.  Abdominal: Soft. Normal appearance and bowel sounds are normal. She exhibits no distension. There is no tenderness. There is no rebound and no CVA tenderness.  Musculoskeletal: Normal range of motion. She exhibits no edema or tenderness.  Neurological: She is alert and oriented to person, place, and time. She has normal strength. No cranial nerve deficit or sensory deficit. GCS eye subscore is 4. GCS verbal subscore is 5. GCS motor subscore is 6.  Skin: Skin is warm and dry. No abrasion and no rash noted.  Psychiatric: She has a normal mood and affect. Her speech is normal and behavior is normal.  Nursing note and vitals reviewed.   ED Course  Procedures (including critical care time) Labs Review Labs Reviewed  TROPONIN I  CBC WITH DIFFERENTIAL/PLATELET  COMPREHENSIVE METABOLIC PANEL  LIPASE, BLOOD    Imaging Review No results found.   EKG Interpretation   Date/Time:  Thursday September 06 2014 16:33:43 EDT Ventricular Rate:  78 PR Interval:  137 QRS Duration: 82 QT Interval:  343 QTC Calculation: 391 R Axis:   -4 Text Interpretation:  Sinus rhythm Posterior infarct, old Abnormal T,  consider ischemia, diffuse leads Confirmed by OTTER  MD, OLGA ([id number]) on  09/06/2014 4:39:23 PM Also confirmed by Freida BusmanALLEN  MD, Davaris Youtsey ([id number])  on  09/06/2014 4:51:03 PM      MDM   Final diagnoses:  Chest pain    Patient with elevated troponin here as well as subtle changes noted to her EKG. Discussed with cardiology who plan to come see the patient. Spoke with the patient who does not want to be admitted at this time. I explained the risk of sudden cardiac death and or disability to the patient and she accepts this risk. She is signing out [facility]    Lorre NickAnthony Valdis Bevill, MD 09/06/14 1902

## 2014-09-06 NOTE — ED Provider Notes (Signed)
CSN: [id number]     Arrival date & time 09/06/14  1457 History   First MD Initiated Contact with Patient 09/06/14 1511     Chief Complaint  Patient presents with  . Chest Pain   (Consider location/radiation/quality/duration/timing/severity/associated sxs/prior Treatment) HPI        44 year old female with a history of an NSTEMI 3 months ago presents complaining of chest pain. She has substernal chest pain that radiates to her back. This is associated with shortness of breath. This started at rest on Tuesday. She called her cardiologist who told her to take nitroglycerin, this helped with the pain but did not completely relieve it. The pain has persisted up until today. Now the nitroglycerin doesn't seem to be helping. She is concerned that she could be having another heart attack. She called her cardiologist and they told her to go to the emergency department so she came here to the urgent care.  Past Medical History  Diagnosis Date  . Hypertension   . Diabetes mellitus without complication    Past Surgical History  Procedure Laterality Date  . Left heart catheterization with coronary angiogram N/A 05/29/2014    Procedure: LEFT HEART CATHETERIZATION WITH CORONARY ANGIOGRAM;  Surgeon: Corky CraftsJayadeep S Varanasi, MD;  [facility];  Service: Cardiovascular;  Laterality: N/A;  . Percutaneous coronary stent intervention (pci-s)  05/29/2014    Procedure: PERCUTANEOUS CORONARY STENT INTERVENTION (PCI-S);  Surgeon: Corky CraftsJayadeep S Varanasi, MD;  [facility];  Service: Cardiovascular;;   Family History  Problem Relation Age of Onset  . Hypertension Mother   . Kidney failure Mother   . Heart attack Father   . Heart disease Father    History  Substance Use Topics  . Smoking status: Current Every Day Smoker -- 0.50 packs/day    Types: Cigarettes  . Smokeless tobacco: Not on file  . Alcohol Use: No   OB History    No data available     Review of Systems  Respiratory: Positive for  shortness of breath.   Cardiovascular: Positive for chest pain.  Musculoskeletal: Positive for back pain.  All other systems reviewed and are negative.   Allergies  Review of patient's allergies indicates no known allergies.  Home Medications   Prior to Admission medications   Medication Sig Start Date End Date Taking? Authorizing Provider  aspirin 81 MG tablet Take 81 mg by mouth daily.    Historical Provider, MD  atorvastatin (LIPITOR) 80 MG tablet Take 1 tablet (80 mg total) by mouth daily at 6 PM. 05/31/14   Brittainy M Sharol HarnessSimmons, PA-C  insulin glargine (LANTUS) 100 UNIT/ML injection Inject 10 Units into the skin at bedtime.    Historical Provider, MD  lisinopril (PRINIVIL,ZESTRIL) 10 MG tablet Take 1 tablet (10 mg total) by mouth daily. 03/11/14   Ria ClockJennifer Lee H Presson, PA  metFORMIN (GLUCOPHAGE) 500 MG tablet Take 1 tablet (500 mg total) by mouth 2 (two) times daily with a meal. 06/02/14   Brittainy Sherlynn CarbonM Simmons, PA-C  metoprolol tartrate (LOPRESSOR) 25 MG tablet Take 25 mg by mouth 2 (two) times daily.    Historical Provider, MD  nitroGLYCERIN (NITROSTAT) 0.4 MG SL tablet Place 1 tablet (0.4 mg total) under the tongue every 5 (five) minutes as needed for chest pain. 07/31/14   Leone BrandLaura R Ingold, NP  ticagrelor (BRILINTA) 90 MG TABS tablet Take 1 tablet (90 mg total) by mouth 2 (two) times daily. 08/16/14   Runell GessJonathan J Berry, MD   BP 141/72  mmHg  Pulse 84  Temp(Src) 98.6 F (37 C) (Oral)  Resp 16  SpO2 100% Physical Exam  Constitutional: She is oriented to person, place, and time. Vital signs are normal. She appears well-developed and well-nourished. No distress.  HENT:  Head: Normocephalic and atraumatic.  Pulmonary/Chest: Effort normal. No respiratory distress.  Neurological: She is alert and oriented to person, place, and time. She has normal strength. Coordination normal.  Skin: Skin is warm and dry. No rash noted. She is not diaphoretic.  Psychiatric: She has a normal mood and affect.  Judgment normal.  Nursing note and vitals reviewed.   ED Course  EKG  Date/Time: 09/06/2014 3:26 PM Performed by: Autumn Messing H Authorized by: Charm Rings Comparison: compared with previous ECG  Comparison to previous ECG: New lateral T-wave inversions Rhythm: sinus rhythm Rate: normal QRS axis: normal Conduction: conduction normal ST Segments: ST segments normal T depression: II, III, aVF, V4, V5 and V6 Other: no other findings Clinical impression: myocardial ischemia Comments: Concern for inferolateral ischemia. EKG independently reviewed by me as above   (including critical care time) Labs Review Labs Reviewed - No data to display  Imaging Review No results found.   MDM   1. Chest pain, unspecified chest pain type   2. Abnormal EKG    Ischemic EKG with chest pain arrest, concern for unstable angina or NSTEMI . She already takes 81 mg aspirin daily and Brilinta twice daily, we will give her 3 additional 81 mg aspirin, IV, nitroglycerin every 5 minutes until pain resolves as blood pressure allows, transferred to emergency department via EMS or [facility]  Meds ordered this encounter  Medications  . 0.9 %  sodium chloride infusion    Sig:   . aspirin chewable tablet 243 mg    Sig:   . nitroGLYCERIN (NITROSTAT) SL tablet 0.4 mg    Sig:        Graylon Good, PA-C 09/06/14 1528  Graylon Good, PA-C 09/06/14 1530

## 2014-09-06 NOTE — ED Notes (Signed)
Called [facility] spoke to ElaineStephanie for Dr. Freida BusmanAllen [phone]

## 2014-09-06 NOTE — ED Notes (Signed)
Placed on  Cardiac  Monitor  And  Nasal  02  At  2  l /  min

## 2014-09-06 NOTE — ED Notes (Signed)
Pt st's she is not going to stay in the hosp.  Dr. Freida BusmanAllen in to explain to pt reason that she needs to be in the hospital.  Dr. Freida BusmanAllen gave pt risks of leaving hosp.  And explained to pt that she needed to stay and if she didn't she needed to return if any problems.  Pt voices understanding of Dr. Freida BusmanAllen

## 2014-09-07 ENCOUNTER — Telehealth: Payer: Self-pay | Admitting: Cardiovascular Disease

## 2014-09-07 ENCOUNTER — Encounter (HOSPITAL_COMMUNITY): Payer: Self-pay

## 2014-09-07 ENCOUNTER — Inpatient Hospital Stay (HOSPITAL_COMMUNITY)
Admission: EM | Admit: 2014-09-07 | Discharge: 2014-09-08 | DRG: 247 | Disposition: A | Discharge status: Home / Self Care | Payer: Medicaid Other | Attending: Cardiology | Admitting: Cardiology

## 2014-09-07 ENCOUNTER — Encounter (HOSPITAL_COMMUNITY)
Admission: EM | Disposition: A | Discharge status: Home / Self Care | Payer: Self-pay | Source: Home / Self Care | Attending: Cardiology

## 2014-09-07 DIAGNOSIS — Z8249 Family history of ischemic heart disease and other diseases of the circulatory system: Secondary | ICD-10-CM | POA: Diagnosis not present

## 2014-09-07 DIAGNOSIS — E119 Type 2 diabetes mellitus without complications: Secondary | ICD-10-CM | POA: Diagnosis present

## 2014-09-07 DIAGNOSIS — Z794 Long term (current) use of insulin: Secondary | ICD-10-CM

## 2014-09-07 DIAGNOSIS — I200 Unstable angina: Secondary | ICD-10-CM

## 2014-09-07 DIAGNOSIS — I25110 Atherosclerotic heart disease of native coronary artery with unstable angina pectoris: Secondary | ICD-10-CM | POA: Diagnosis present

## 2014-09-07 DIAGNOSIS — Z79899 Other long term (current) drug therapy: Secondary | ICD-10-CM | POA: Diagnosis not present

## 2014-09-07 DIAGNOSIS — I252 Old myocardial infarction: Secondary | ICD-10-CM

## 2014-09-07 DIAGNOSIS — E785 Hyperlipidemia, unspecified: Secondary | ICD-10-CM | POA: Diagnosis present

## 2014-09-07 DIAGNOSIS — Z955 Presence of coronary angioplasty implant and graft: Secondary | ICD-10-CM | POA: Diagnosis not present

## 2014-09-07 DIAGNOSIS — Z7901 Long term (current) use of anticoagulants: Secondary | ICD-10-CM | POA: Diagnosis not present

## 2014-09-07 DIAGNOSIS — I10 Essential (primary) hypertension: Secondary | ICD-10-CM | POA: Diagnosis present

## 2014-09-07 DIAGNOSIS — Z9861 Coronary angioplasty status: Secondary | ICD-10-CM

## 2014-09-07 DIAGNOSIS — I214 Non-ST elevation (NSTEMI) myocardial infarction: Secondary | ICD-10-CM | POA: Diagnosis present

## 2014-09-07 DIAGNOSIS — F17210 Nicotine dependence, cigarettes, uncomplicated: Secondary | ICD-10-CM | POA: Diagnosis present

## 2014-09-07 DIAGNOSIS — Z7982 Long term (current) use of aspirin: Secondary | ICD-10-CM | POA: Diagnosis not present

## 2014-09-07 DIAGNOSIS — I2510 Atherosclerotic heart disease of native coronary artery without angina pectoris: Secondary | ICD-10-CM | POA: Insufficient documentation

## 2014-09-07 HISTORY — DX: Hyperlipidemia, unspecified: E78.5

## 2014-09-07 HISTORY — DX: Atherosclerotic heart disease of native coronary artery without angina pectoris: I25.10

## 2014-09-07 HISTORY — PX: LEFT HEART CATHETERIZATION WITH CORONARY ANGIOGRAM: SHX5451

## 2014-09-07 LAB — CBC
HEMATOCRIT: 40.9 % (ref 36.0–46.0)
HEMATOCRIT: 38.2 % (ref 36.0–46.0)
HEMOGLOBIN: 14.1 g/dL (ref 12.0–15.0)
Hemoglobin: 13 g/dL (ref 12.0–15.0)
MCH: 29.8 pg (ref 26.0–34.0)
MCH: 29 pg (ref 26.0–34.0)
MCHC: 34.5 g/dL (ref 30.0–36.0)
MCHC: 34 g/dL (ref 30.0–36.0)
MCV: 86.5 fL (ref 78.0–100.0)
MCV: 85.3 fL (ref 78.0–100.0)
Platelets: 181 10*3/uL (ref 150–400)
Platelets: 184 10*3/uL (ref 150–400)
RBC: 4.73 MIL/uL (ref 3.87–5.11)
RBC: 4.48 MIL/uL (ref 3.87–5.11)
RDW: 13.2 % (ref 11.5–15.5)
RDW: 13.1 % (ref 11.5–15.5)
WBC: 7.6 10*3/uL (ref 4.0–10.5)
WBC: 8.3 10*3/uL (ref 4.0–10.5)

## 2014-09-07 LAB — CREATININE, SERUM
CREATININE: 0.77 mg/dL (ref 0.50–1.10)
GFR calc Af Amer: >90 mL/min (ref >90)

## 2014-09-07 LAB — GLUCOSE, CAPILLARY
GLUCOSE-CAPILLARY: 336 mg/dL — AB (ref 70–99)
Glucose-Capillary: 389 mg/dL — ABNORMAL HIGH (ref 70–99)
Glucose-Capillary: 125 mg/dL — ABNORMAL HIGH (ref 70–99)

## 2014-09-07 LAB — BASIC METABOLIC PANEL
Anion gap: 12 (ref 5–15)
BUN: 17 mg/dL (ref 6–23)
CO2: 21 mmol/L (ref 19–32)
CREATININE: 0.97 mg/dL (ref 0.50–1.10)
Calcium: 9.1 mg/dL (ref 8.4–10.5)
Chloride: 98 mmol/L (ref 96–112)
GFR calc Af Amer: 82 mL/min — ABNORMAL LOW (ref >90)
GFR calc non Af Amer: 71 mL/min — ABNORMAL LOW (ref >90)
Glucose, Bld: 483 mg/dL — ABNORMAL HIGH (ref 70–99)
POTASSIUM: 4.6 mmol/L (ref 3.5–5.1)
Sodium: 131 mmol/L — ABNORMAL LOW (ref 135–145)

## 2014-09-07 LAB — PROTIME-INR
INR: 0.97 (ref 0.00–1.49)
PROTHROMBIN TIME: 12.9 s (ref 11.6–15.2)

## 2014-09-07 LAB — POCT ACTIVATED CLOTTING TIME: Activated Clotting Time: 282 seconds

## 2014-09-07 LAB — TROPONIN I
TROPONIN I: 0.06 ng/mL — AB (ref <0.031)
TROPONIN I: 0.06 ng/mL — AB (ref <0.031)
Troponin I: 0.05 ng/mL — ABNORMAL HIGH (ref <0.031)

## 2014-09-07 LAB — I-STAT TROPONIN, ED: TROPONIN I, POC: 0.07 ng/mL (ref 0.00–0.08)

## 2014-09-07 SURGERY — LEFT HEART CATHETERIZATION WITH CORONARY ANGIOGRAM
Anesthesia: LOCAL

## 2014-09-07 MED ORDER — FENTANYL CITRATE (PF) 100 MCG/2ML IJ SOLN
INTRAMUSCULAR | Status: AC
  Filled 2014-09-07: qty 2

## 2014-09-07 MED ORDER — SODIUM CHLORIDE 0.9 % IJ SOLN
3.0000 mL | Freq: Two times a day (BID) | INTRAMUSCULAR | Status: DC
Start: 2014-09-07 — End: 2014-09-07

## 2014-09-07 MED ORDER — TICAGRELOR 90 MG PO TABS
90.0000 mg | ORAL_TABLET | Freq: Two times a day (BID) | ORAL | Status: DC
  Administered 2014-09-07 – 2014-09-08 (×2): 90 mg via ORAL
  Filled 2014-09-07 (×3): qty 1

## 2014-09-07 MED ORDER — ASPIRIN 81 MG PO CHEW: 324.0000 mg | CHEWABLE_TABLET | ORAL | Status: AC

## 2014-09-07 MED ORDER — METOPROLOL TARTRATE 25 MG PO TABS
25.0000 mg | ORAL_TABLET | Freq: Two times a day (BID) | ORAL | Status: DC
  Administered 2014-09-07 – 2014-09-08 (×2): 25 mg via ORAL
  Filled 2014-09-07 (×2): qty 1

## 2014-09-07 MED ORDER — SODIUM CHLORIDE 0.9 % IJ SOLN: 3.0000 mL | INTRAMUSCULAR | Status: DC | PRN

## 2014-09-07 MED ORDER — ONDANSETRON HCL 4 MG/2ML IJ SOLN: 4.0000 mg | Freq: Four times a day (QID) | INTRAMUSCULAR | Status: DC | PRN

## 2014-09-07 MED ORDER — SODIUM CHLORIDE 0.9 % IJ SOLN: 3.0000 mL | Freq: Two times a day (BID) | INTRAMUSCULAR | Status: DC

## 2014-09-07 MED ORDER — LISINOPRIL 10 MG PO TABS: 10.0000 mg | ORAL_TABLET | Freq: Every day | ORAL | Status: DC

## 2014-09-07 MED ORDER — ASPIRIN EC 81 MG PO TBEC
81.0000 mg | DELAYED_RELEASE_TABLET | Freq: Every day | ORAL | Status: DC
  Administered 2014-09-08: 10:00:00 81 mg via ORAL
  Filled 2014-09-07: qty 1

## 2014-09-07 MED ORDER — ATORVASTATIN CALCIUM 80 MG PO TABS
80.0000 mg | ORAL_TABLET | Freq: Every day | ORAL | Status: DC
  Administered 2014-09-07 – 2014-09-08 (×2): 80 mg via ORAL
  Filled 2014-09-07 (×2): qty 1

## 2014-09-07 MED ORDER — ANGIOPLASTY BOOK
Freq: Once | Status: AC
  Administered 2014-09-07: 22:00:00
  Filled 2014-09-07: qty 1

## 2014-09-07 MED ORDER — LIDOCAINE HCL (PF) 1 % IJ SOLN
INTRAMUSCULAR | Status: AC
Start: 2014-09-07 — End: 2014-09-07
  Filled 2014-09-07: qty 30

## 2014-09-07 MED ORDER — SODIUM CHLORIDE 0.9 % IV SOLN: 250.0000 mL | INTRAVENOUS | Status: DC | PRN

## 2014-09-07 MED ORDER — ASPIRIN 81 MG PO CHEW: 81.0000 mg | CHEWABLE_TABLET | ORAL | Status: DC

## 2014-09-07 MED ORDER — ASPIRIN 300 MG RE SUPP: 300.0000 mg | RECTAL | Status: AC

## 2014-09-07 MED ORDER — NITROGLYCERIN 1 MG/10 ML FOR IR/CATH LAB
INTRA_ARTERIAL | Status: AC
  Filled 2014-09-07: qty 10

## 2014-09-07 MED ORDER — NITROGLYCERIN 0.4 MG SL SUBL: 0.4000 mg | SUBLINGUAL_TABLET | SUBLINGUAL | Status: DC | PRN

## 2014-09-07 MED ORDER — TICAGRELOR 90 MG PO TABS
ORAL_TABLET | ORAL | Status: AC
  Filled 2014-09-07: qty 1

## 2014-09-07 MED ORDER — HEPARIN (PORCINE) IN NACL 2-0.9 UNIT/ML-% IJ SOLN
INTRAMUSCULAR | Status: AC
Start: 2014-09-07 — End: 2014-09-07
  Filled 2014-09-07: qty 1000

## 2014-09-07 MED ORDER — LISINOPRIL 10 MG PO TABS
10.0000 mg | ORAL_TABLET | Freq: Every day | ORAL | Status: DC
  Administered 2014-09-08: 10 mg via ORAL
  Filled 2014-09-07: qty 1

## 2014-09-07 MED ORDER — SODIUM CHLORIDE 0.9 % IV SOLN: INTRAVENOUS | Status: AC

## 2014-09-07 MED ORDER — MORPHINE SULFATE 2 MG/ML IJ SOLN: 2.0000 mg | INTRAMUSCULAR | Status: DC | PRN

## 2014-09-07 MED ORDER — HEPARIN SODIUM (PORCINE) 5000 UNIT/ML IJ SOLN: 5000.0000 [IU] | Freq: Three times a day (TID) | INTRAMUSCULAR | Status: DC

## 2014-09-07 MED ORDER — MIDAZOLAM HCL 2 MG/2ML IJ SOLN
INTRAMUSCULAR | Status: AC
  Filled 2014-09-07: qty 2

## 2014-09-07 MED ORDER — INSULIN GLARGINE 100 UNIT/ML ~~LOC~~ SOLN
10.0000 [IU] | Freq: Every day | SUBCUTANEOUS | Status: DC
  Administered 2014-09-07: 21:00:00 10 [IU] via SUBCUTANEOUS
  Filled 2014-09-07 (×2): qty 0.1

## 2014-09-07 MED ORDER — ACETAMINOPHEN 325 MG PO TABS: 650.0000 mg | ORAL_TABLET | ORAL | Status: DC | PRN

## 2014-09-07 MED ORDER — INSULIN ASPART 100 UNIT/ML ~~LOC~~ SOLN
0.0000 [IU] | Freq: Three times a day (TID) | SUBCUTANEOUS | Status: DC
  Administered 2014-09-07: 19:00:00 15 [IU] via SUBCUTANEOUS
  Administered 2014-09-08: 07:00:00 5 [IU] via SUBCUTANEOUS

## 2014-09-07 MED ORDER — ASPIRIN 81 MG PO CHEW
324.0000 mg | CHEWABLE_TABLET | Freq: Once | ORAL | Status: AC
  Administered 2014-09-07: 324 mg via ORAL
  Filled 2014-09-07: qty 4

## 2014-09-07 MED ORDER — HEPARIN SODIUM (PORCINE) 1000 UNIT/ML IJ SOLN
INTRAMUSCULAR | Status: AC
  Filled 2014-09-07: qty 1

## 2014-09-07 MED ORDER — SODIUM CHLORIDE 0.9 % IV SOLN
1.0000 mL/kg/h | INTRAVENOUS | Status: DC
Start: 2014-09-08 — End: 2014-09-07
  Administered 2014-09-07: 1 mL/kg/h via INTRAVENOUS

## 2014-09-07 NOTE — ED Notes (Signed)
Chris, PA at the bedside.  

## 2014-09-07 NOTE — ED Notes (Signed)
Cardiology at the beside 

## 2014-09-07 NOTE — Interval H&P Note (Signed)
History and Physical Interval Note:  09/07/2014 1:31 PM  [name]  has presented today for surgery, with the diagnosis of cp - Unstable Angina  The various methods of treatment have been discussed with the patient and family. After consideration of risks, benefits and other options for treatment, the patient has consented to  Procedure(s): LEFT HEART CATHETERIZATION WITH CORONARY ANGIOGRAM (N/A) as a surgical intervention .  The patient's history has been reviewed, patient examined, no change in status, stable for surgery.  I have reviewed the patient's chart and labs.  Questions were answered to the patient's satisfaction.     HARDING, DAVID W  Cath Lab Visit (complete for each Cath Lab visit)  Clinical Evaluation Leading to the Procedure:   ACS: Yes.    Non-ACS:    Anginal Classification: CCS IV  Anti-ischemic medical therapy: Maximal Therapy (2 or more classes of medications)  Non-Invasive Test Results: No non-invasive testing performed  Prior CABG: No previous CABG  TIMI SCORE  Patient Information:  TIMI Score is 4  UA/NSTEMI and intermediate-risk features (e.g., TIMI score 3?4) for short-term risk of death or nonfatal MI  Revascularization of the presumed culprit artery   A (8)  Indication: 10; Score: 8   HARDING, Piedad ClimesAVID W, M.D., M.S. Interventional Cardiologist   Pager # [phone]

## 2014-09-07 NOTE — ED Notes (Signed)
Pt was seen here yesterday for cp and thought it was heart burn. Had called her cardiologist and was told to take NTG. Came in and was seen but didn't have anyone to take her son to school and had to leave. Called her cardiologist back and was told to come back. Denies any pain at present. No SOB.

## 2014-09-07 NOTE — CV Procedure (Signed)
CARDIAC CATHETERIZATION AND PERCUTANEOUS CORONARY INTERVENTION REPORT  NAME:  [name]   MRN: [id number] DOB:  1971-01-22   ADMIT DATE: 09/07/2014 Procedure Date: 09/07/2014  INTERVENTIONAL CARDIOLOGIST: Leonie Man, M.D., MS PRIMARY CARE PROVIDER: Philis Fendt, MD PRIMARY CARDIOLOGIST: ? Dr. Gwenlyn Found, Dr. Irish Lack  PATIENT:  [name] is a 44 y.o. female with severe type 2 diabetes, hypertension and hyperlipidemia as well as obesity who presented back in January 2016 with a non-STEMI and was found to have multivessel disease. She underwent PCI to the proximal circumflex with PTCA of the mid circumflex that was 100 and occluded. She also had PTCA of the RPDA which was occluded followed by PCI of the mid RCA. Both stents were Synergy DES stents 2.25 mm in diameter (RCA 2.25 mm x 16 mm. Proximal Circumflex 2.25 mm x 12 mm).  Her downstream vessels in the RCA as well as circumflex were very small in caliber and were not amenable maybe more the balloon angioplasty at the time.  She was also noted to have severe diffuse disease in the distal LAD.  She she [facility] emergency room on April 28 with symptoms concerning for unstable angina. Unfortunately because of family situation she needed to go home to ensure that her son was cared for. She left AGAINST MEDICAL ADVICE and now presents back with chest pain and is referred for cardiac catheterization plus minus PCI.  PRE-OPERATIVE DIAGNOSIS:    Unstable Angina  CAD status post PTCA and PCI  PROCEDURES PERFORMED:    Left Heart Catheterization with Native Coronary Angiography  via Right Radial Artery   Left Ventriculography  Percutaneous Coronary Intervention of the Proximal RCA - 90% lesion reduced to 0% with Synergy DES 2.200 mm x 20 mm (2.6 mm proximal, 2.5 mm distal)  PROCEDURE: The patient was brought to the [facility] in the fasting state and prepped and draped in the  usual sterile fashion for right radial artery access. A modified Allen's test was performed on the right wrist demonstrating excellent collateral flow for radial access.   Sterile technique was used including antiseptics, cap, gloves, gown, hand hygiene, mask and sheet. Skin prep: Chlorhexidine.   Consent: Risks of procedure as well as the alternatives and risks of each were explained to the (patient/caregiver). Consent for procedure obtained.   Time Out: Verified patient identification, verified procedure, site/side was marked, verified correct patient position, special equipment/implants available, medications/allergies/relevent history reviewed, required imaging and test results available. Performed.  Access:   Right Radial Artery: 6 Fr Sheath -  Seldinger Technique (Angiocath Micropuncture Kit)  Radial Cocktail - 10 mL; IV Heparin 5500 Units   Left Heart Catheterization: 5 Fr Catheters advanced or exchanged over a long exchange safety J-wire direct fluoroscopic guidance into the ascending aorta and used to selectively engage the coronary arteries.  TIG  4.0 catheter advanced first.  Left Coronary Artery Cineangiography: JL 3.5 Catheter  Right Coronary Artery Cineangiography: TIG 4.0 Catheter   LV Hemodynamics (LV Gram): Straight Pigtail  Sheath removed in the cardiac catheterization lab with TR band placement for hemostasis.  TR Band: 1440  Hours; 14 mL air  FINDINGS:  Hemodynamics:   Central Aortic Pressure / Mean: 123/68/89 mmHg  Left Ventricular Pressure / LVEDP: 120/1/7 mmHg  Left Ventriculography:  EF: Roughly 50 %  Wall Motion: Severe hypokinesis of the mid to apical inferior wall and apex  Coronary Anatomy:  Dominance: Right  Left Main: Medium  caliber vessel, angiographically normal. Bifurcates distally into the LAD and Circumflex LAD: Medium size vessel that reaches down to the apex. There is mild to moderate disease proximally with several small medium-sized  diagonal branches are patent. After the larger of the 3 branches in the mid vessel there is diffuse moderate to severe disease with some focal areas of at least 80% in the distal LAD prior to reaching the apex. These are in a less than 2 mm vessel.  From the proximal LAD there is a major septal perforator that has almost a tandem branch that runs parallel to the LAD. The larger branch has an area of subtotal occlusion.  Left Circumflex: Medium caliber, nondominant vessel. There is ostial roughly 40% stenosis leading into the proximal stent that others otherwise patent. There are 2 small OM branches in the proximal segment. The first one has severe diffuse disease. Distal to the stent the vessel bifurcates into the AV groove circumflex which was previously treated with angioplasty. There is a focal lesion at the origin of the AV groove branch as well as at a more distal bifurcation that is at least 70-80%.. The distal AV groove branch is very small in caliber. The follow on circumflex after the AV groove bifurcates into 2 OM branches that are relatively free of disease.   RCA: Medium caliber, dominant vessel. The previously noted mild proximal disease is now severe with a focal 95% stenosis that tapers back to relatively normal vessel prior to the mid vessel stent that has a roughly 10-20% in-stent restenosis proximally. Distal to the stent there is a tubular 30% stenosis. The bifurcation distally is into 2 very small less than 1.5 mm vessels. The PDA is occluded with the PCI wire. The posterior lateral branch is slightly larger with no significant lesion and bifurcates into 2 posterolateral branches. The PDA obviously has a roughly 90% focal stenosis at the ostium.    After reviewing the initial angiography, the culprit lesion was thought to be the 95% prox RCA lesion.Marland Kitchen  Preparation were made to proceed with PCI on this lesion.  Percutaneous Coronary Intervention:  Sheath exchanged for 6 Fr Guide: 6 Fr    JR4 Guidewire: BMW Predilation Balloon: Euphora 2.25 mm x 15 mm;   6 Atm x 30 Sec,   Stent: Synergy DES 2.25 mm x 20 mm;   16 Atm x 30 Sec, Post-dilation Balloon: Alcorn Euphora 2.5 mm x 12 mm;   16 Atm x 45 Sec - prox stent, 16 Atm x 45 Sec distal stent  Final Diameter: 2.6 mm prox & 2.5 mm distal stent  Post deployment angiography in multiple views, with and without guidewire in place revealed excellent stent deployment and lesion coverage.  There was no evidence of dissection or perforation.  MEDICATIONS:  Anesthesia:  Local Lidocaine 2 ml  Sedation:  2 mg IV Versed, 50 mcg IV fentanyl ;   Premedication: Home dose of Brilinta as well as 324 mg ASA  Omnipaque Contrast: 120 ml  Anticoagulation:  IV Heparin 5500+ additional 1000 Units ; ACT for PC > 280 Sec  Anti-Platelet Agent:  90 mg oral Brilinta in addition to standing home dose Radial Cocktail: 5 mg Verapamil, 400 mcg NTG, 2 ml 2% Lidocaine in 10 ml NS IC NTG 200 mcg x 1  PATIENT DISPOSITION:    The patient was transferred to the PACU holding area in a hemodynamicaly stable, chest pain free condition.  The patient tolerated the procedure well, and there were no  complications.  EBL:   < 10 ml  The patient was stable before, during, and after the procedure.  POST-OPERATIVE DIAGNOSIS:    Severe multivessel, small vessel disease with new 95% lesion in the proximal RCA treated successfully with a synergy DES 2.25 mm x 20 mm stent postdilated 2.6 mm proximally & 2.5 mm distally.  Mild in-stent restenosis in the mid RCA synergy stent with roughly 40% stenosis in the ostial portion of the ostial/proximal circumflex stent.  Preserved LVEF of roughly 50% with mid to apical inferior severe hypokinesis.  PLAN OF CARE:  Standard post radial cath PCI care with TR band removal  Continue aspirin plus Brilinta - agree with the recommendation for lifelong dual platelet therapy.  Continue aggressive risk factor  modification.  Expect discharge the morning.  Follow up with Dr. Gwenlyn Found or Dr. Verlon Au, Leonie Green, M.D., M.S. Interventional Cardiologist   Pager # [phone]

## 2014-09-07 NOTE — ED Provider Notes (Signed)
CSN: [id number]     Arrival date & time 09/07/14  [id number] History   First MD Initiated Contact with Patient 09/07/14 0932     Chief Complaint  Patient presents with  . Abnormal Lab     (Consider location/radiation/quality/duration/timing/severity/associated sxs/prior Treatment) HPI Patient presents to the emergency department with elevated troponin and EKG changes.  The patient was here yesterday evening for evaluation of chest pain and she is found to have an elevated troponin and abnormal EKG compared to previous.  The patient states that she was feeling better and that is why she decided to leave AGAINST MEDICAL ADVICE.  She called her cardiologist this morning and they advised her to come back to the emergency department for further evaluation.  Patient states at this time.  She does not currently have chest pain.  She is also having noted shortness of breath, weakness, dizziness, headache, blurred vision, diaphoresis, nausea, vomiting, abdominal pain, back pain, or syncope.  The patient states that he had previous MIs.  She also has a history of diabetes and hypertension.  He states she currently smokes 4 cigarettes a day. Past Medical History  Diagnosis Date  . Hypertension   . Diabetes mellitus without complication   . MI (myocardial infarction)    Past Surgical History  Procedure Laterality Date  . Left heart catheterization with coronary angiogram N/A 05/29/2014    Procedure: LEFT HEART CATHETERIZATION WITH CORONARY ANGIOGRAM;  Surgeon: Corky CraftsJayadeep S Varanasi, MD;  [facility];  Service: Cardiovascular;  Laterality: N/A;  . Percutaneous coronary stent intervention (pci-s)  05/29/2014    Procedure: PERCUTANEOUS CORONARY STENT INTERVENTION (PCI-S);  Surgeon: Corky CraftsJayadeep S Varanasi, MD;  [facility];  Service: Cardiovascular;;   Family History  Problem Relation Age of Onset  . Hypertension Mother   . Kidney failure Mother   . Heart attack Father   . Heart disease Father     History  Substance Use Topics  . Smoking status: Current Every Day Smoker -- 0.50 packs/day    Types: Cigarettes  . Smokeless tobacco: Not on file  . Alcohol Use: No   OB History    No data available     Review of Systems  All other systems negative except as documented in the HPI. All pertinent positives and negatives as reviewed in the HPI.  Allergies  Review of patient's allergies indicates no known allergies.  Home Medications   Prior to Admission medications   Medication Sig Start Date End Date Taking? Authorizing Provider  aspirin 81 MG tablet Take 81 mg by mouth daily.    Historical Provider, MD  atorvastatin (LIPITOR) 80 MG tablet Take 1 tablet (80 mg total) by mouth daily at 6 PM. 05/31/14   Brittainy M Sharol HarnessSimmons, PA-C  insulin glargine (LANTUS) 100 UNIT/ML injection Inject 10 Units into the skin at bedtime.    Historical Provider, MD  lisinopril (PRINIVIL,ZESTRIL) 10 MG tablet Take 1 tablet (10 mg total) by mouth daily. 03/11/14   Ria ClockJennifer Lee H Presson, PA  metFORMIN (GLUCOPHAGE) 500 MG tablet Take 1 tablet (500 mg total) by mouth 2 (two) times daily with a meal. 06/02/14   Brittainy Sherlynn CarbonM Simmons, PA-C  metoprolol tartrate (LOPRESSOR) 25 MG tablet Take 25 mg by mouth 2 (two) times daily.    Historical Provider, MD  nitroGLYCERIN (NITROSTAT) 0.4 MG SL tablet Place 1 tablet (0.4 mg total) under the tongue every 5 (five) minutes as needed for chest pain. 07/31/14   Leone BrandLaura R Ingold, NP  ticagrelor (BRILINTA) 90 MG TABS tablet Take 1 tablet (90 mg total) by mouth 2 (two) times daily. 08/16/14   Runell Gess, MD   BP 132/78 mmHg  Pulse 84  Temp(Src) 98.2 F (36.8 C) (Oral)  Resp 20  Ht  (1.6 m)  Wt 165 lb (74.844 kg)  BMI 29.24 kg/m2  SpO2 100% Physical Exam  Constitutional: She is oriented to person, place, and time. She appears well-developed and well-nourished. No distress.  HENT:  Head: Normocephalic and atraumatic.  Mouth/Throat: Oropharynx is clear and moist.   Eyes: Pupils are equal, round, and reactive to light.  Neck: Normal range of motion. Neck supple.  Cardiovascular: Normal rate, regular rhythm and normal heart sounds.  Exam reveals no gallop and no friction rub.   No murmur heard. Pulmonary/Chest: Effort normal and breath sounds normal. No respiratory distress.  Neurological: She is alert and oriented to person, place, and time. She exhibits normal muscle tone. Coordination normal.  Skin: Skin is warm and dry. No rash noted. No erythema.  Psychiatric: She has a normal mood and affect. Her behavior is normal.  Nursing note and vitals reviewed.   ED Course  Procedures (including critical care time) Labs Review Labs Reviewed  CBC  BASIC METABOLIC PANEL  TROPONIN I    Imaging Review Dg Chest 2 View  09/06/2014   CLINICAL DATA:  Chest pain for 3 days  EXAM: CHEST  2 VIEW  COMPARISON:  05/29/2014  FINDINGS: Normal heart size and mediastinal contours. No acute infiltrate or edema. No effusion or pneumothorax. No acute osseous findings.  IMPRESSION: Negative chest.   Electronically Signed   By: Marnee Spring M.D.   On: 09/06/2014 18:22     EKG Interpretation   Date/Time:  Friday September 07 2014 09:36:17 EDT Ventricular Rate:  85 PR Interval:  136 QRS Duration: 93 QT Interval:  367 QTC Calculation: 436 R Axis:   -31 Text Interpretation:  Sinus rhythm Inferior infarct, old ST/T wave changes  unchanged from yesterday Confirmed by GOLDSTON  MD, SCOTT [phone]) on  09/07/2014 9:41:27 AM     I spoke with cardiology about the patient and they will come down to evaluate her.  The patient is stable at this time.  I advised her of the plan and all questions were answered MDM   Final diagnoses:  None        Charlestine Night, PA-C 09/07/14 1008  Pricilla Loveless, MD 09/07/14 1034

## 2014-09-07 NOTE — Telephone Encounter (Signed)
Spoke with pt, she left the hospital because she reports she felt better. Explained to the patient her blood work and EKG showed she may have had an MI. She reports she is not having any chest pain at this time. Advised patient she needs to return to the hospital for evaluation due to the blood work and EKG changes. Patient voiced understanding

## 2014-09-07 NOTE — ED Notes (Signed)
Consent for cath obtained. 

## 2014-09-07 NOTE — Telephone Encounter (Signed)
[name] is calling because she went to the ED on yesterday because she has been experiencing chest pains for the last 3 days and the ED told her that she may have had a mini heart attack , and for her to come in to see her doctor. . Please call    Thanks

## 2014-09-07 NOTE — Progress Notes (Signed)
TR BAND REMOVAL  LOCATION:    right radial  DEFLATED PER PROTOCOL:    Yes.    TIME BAND OFF / DRESSING APPLIED:    1900   SITE UPON ARRIVAL:    Level 0  SITE AFTER BAND REMOVAL:    Level 0  REVERSE ALLEN'S TEST:     positive  CIRCULATION SENSATION AND MOVEMENT:    Within Normal Limits   Yes.    COMMENTS:   Tolerated procedure well 

## 2014-09-07 NOTE — H&P (Addendum)
Primary cardiologist: Dr Gwenlyn Found  HPI: 44 year old female with past medical history of coronary artery disease and diabetes mellitus and hypertension with unstable angina. Patient admitted in January 2016 and ruled in for a myocardial infarction. Cardiac catheterization revealed severe disease in the proximal circumflex and occluded mid circumflex. There was also severe disease in the right coronary artery. Moderate disease in the LAD. Patient had PCI of the proximal and mid circumflex as well as the mid right coronary artery with drug-eluting stents. Echocardiogram January 2016 showed normal LV function. Patient states that Monday evening she developed substernal chest pain radiating to her back. No associated symptoms. Pain not pleuritic, positional or related to food. Lasted several hours and resolved. She had recurrent pain Tuesday and Wednesday. Had "indigestion" Thursday. Seen in the emergency room but left AGAINST MEDICAL ADVICE as she states she had to take her son to school. Patient returns for for further evaluation today. Presently pain-free. She denies dyspnea, orthopnea, PND or pedal edema. No syncope.   (Not in a hospital admission)  No Known Allergies  Past Medical History  Diagnosis Date  . Hypertension   . Diabetes mellitus without complication   . MI (myocardial infarction)     Past Surgical History  Procedure Laterality Date  . Left heart catheterization with coronary angiogram N/A 05/29/2014    Procedure: LEFT HEART CATHETERIZATION WITH CORONARY ANGIOGRAM;  Surgeon: Jettie Booze, MD;  Location: [facility] CATH LAB;  Service: Cardiovascular;  Laterality: N/A;  . Percutaneous coronary stent intervention (pci-s)  05/29/2014    Procedure: PERCUTANEOUS CORONARY STENT INTERVENTION (PCI-S);  Surgeon: Jettie Booze, MD;  Location: [facility] CATH LAB;  Service: Cardiovascular;;    History   Social History  . Marital Status: Single    Spouse Name: N/A  . Number of Children: N/A   . Years of Education: N/A   Occupational History  . Not on file.   Social History Main Topics  . Smoking status: Current Every Day Smoker -- 0.50 packs/day    Types: Cigarettes  . Smokeless tobacco: Not on file  . Alcohol Use: No  . Drug Use: No  . Sexual Activity: Not on file   Other Topics Concern  . Not on file   Social History Narrative    Family History  Problem Relation Age of Onset  . Hypertension Mother   . Kidney failure Mother   . Heart attack Father   . Heart disease Father     ROS:  no fevers or chills, productive cough, hemoptysis, dysphasia, odynophagia, melena, hematochezia, dysuria, hematuria, rash, seizure activity, orthopnea, PND, pedal edema, claudication. Remaining systems are negative.  Physical Exam:   Blood pressure 132/78, pulse 84, temperature 98.2 F (36.8 C), temperature source Oral, resp. rate 20, height '5\' 3"'  (1.6 m), weight 165 lb (74.844 kg), SpO2 100 %.  General:  Well developed/well nourished in NAD Skin warm/dry, tattoos Patient not depressed No peripheral clubbing Back-normal HEENT-normal/normal eyelids Neck supple/normal carotid upstroke bilaterally; no bruits; no JVD; no thyromegaly chest - CTA/ normal expansion CV - RRR/normal S1 and S2; no murmurs, rubs or gallops;  PMI nondisplaced Abdomen -NT/ND, no HSM, no mass, + bowel sounds, no bruit 2+ femoral pulses, no bruits Ext-no edema, chords, 2+ DP Neuro-grossly nonfocal  ECG normal sinus rhythm, inferior lateral T-wave inversion.  Results for orders placed or performed during the hospital encounter of 09/06/14 (from the past 48 hour(s))  Troponin I     Status: Abnormal   Collection Time:  09/06/14  4:56 PM  Result Value Ref Range   Troponin I 0.08 (H) <0.031 ng/mL    Comment:        PERSISTENTLY INCREASED TROPONIN VALUES IN THE RANGE OF 0.04-0.49 ng/mL CAN BE SEEN IN:       -UNSTABLE ANGINA       -CONGESTIVE HEART FAILURE       -MYOCARDITIS       -CHEST TRAUMA        -ARRYHTHMIAS       -LATE PRESENTING MYOCARDIAL INFARCTION       -COPD   CLINICAL FOLLOW-UP RECOMMENDED.   CBC with Differential/Platelet     Status: Abnormal   Collection Time: 09/06/14  4:56 PM  Result Value Ref Range   WBC 7.9 4.0 - 10.5 K/uL   RBC 4.45 3.87 - 5.11 MIL/uL   Hemoglobin 13.2 12.0 - 15.0 g/dL   HCT 37.8 36.0 - 46.0 %   MCV 84.9 78.0 - 100.0 fL   MCH 29.7 26.0 - 34.0 pg   MCHC 34.9 30.0 - 36.0 g/dL   RDW 13.1 11.5 - 15.5 %   Platelets 145 (L) 150 - 400 K/uL   Neutrophils Relative % 61 43 - 77 %   Neutro Abs 4.8 1.7 - 7.7 K/uL   Lymphocytes Relative 32 12 - 46 %   Lymphs Abs 2.6 0.7 - 4.0 K/uL   Monocytes Relative 4 3 - 12 %   Monocytes Absolute 0.3 0.1 - 1.0 K/uL   Eosinophils Relative 2 0 - 5 %   Eosinophils Absolute 0.1 0.0 - 0.7 K/uL   Basophils Relative 1 0 - 1 %   Basophils Absolute 0.0 0.0 - 0.1 K/uL  Comprehensive metabolic panel     Status: Abnormal   Collection Time: 09/06/14  4:56 PM  Result Value Ref Range   Sodium 130 (L) 135 - 145 mmol/L   Potassium 4.3 3.5 - 5.1 mmol/L   Chloride 97 96 - 112 mmol/L   CO2 24 19 - 32 mmol/L   Glucose, Bld 359 (H) 70 - 99 mg/dL   BUN 22 6 - 23 mg/dL   Creatinine, Ser 0.98 0.50 - 1.10 mg/dL   Calcium 8.9 8.4 - 10.5 mg/dL   Total Protein 6.5 6.0 - 8.3 g/dL   Albumin 3.7 3.5 - 5.2 g/dL   AST 15 0 - 37 U/L   ALT 14 0 - 35 U/L   Alkaline Phosphatase 66 39 - 117 U/L   Total Bilirubin 0.5 0.3 - 1.2 mg/dL   GFR calc non Af Amer 70 (L) >90 mL/min   GFR calc Af Amer 81 (L) >90 mL/min    Comment: (NOTE) The eGFR has been calculated using the CKD EPI equation. This calculation has not been validated in all clinical situations. eGFR's persistently <90 mL/min signify possible Chronic Kidney Disease.    Anion gap 9 5 - 15  Lipase, blood     Status: None   Collection Time: 09/06/14  4:56 PM  Result Value Ref Range   Lipase 28 11 - 59 U/L    Dg Chest 2 View  09/06/2014   CLINICAL DATA:  Chest pain for 3 days  EXAM:  CHEST  2 VIEW  COMPARISON:  05/29/2014  FINDINGS: Normal heart size and mediastinal contours. No acute infiltrate or edema. No effusion or pneumothorax. No acute osseous findings.  IMPRESSION: Negative chest.   Electronically Signed   By: Monte Fantasia M.D.   On: 09/06/2014 18:22  Assessment/Plan 1 unstable angina-patient's symptoms have both typical and atypical features. She does state that it is similar to her infarct pain but less severe. Troponin minimally elevated at 0.08. Electrocardiogram shows increased inferior lateral T-wave inversion. Feel definitive evaluation warranted. Plan cardiac catheterization. The risks and benefits were discussed and she agrees to proceed. She does state she has been compliant with her medications. Continue aspirin, brilinta, statin and metoprolol. Continue to cycle enzymes. 2 diabetes mellitus-continue home insulin regimen. Follow CBG. Hold glucophage for 48 hrs following cath. 3 hyperlipidemia-continue statin. 4 hypertension-continue preadmission medications. 5 tobacco abuse-patient counseled on discontinuing.  Kirk Ruths MD 09/07/2014, 9:52 AM

## 2014-09-08 ENCOUNTER — Encounter (HOSPITAL_COMMUNITY): Payer: Self-pay | Admitting: Cardiology

## 2014-09-08 LAB — CBC
HCT: 33.8 % — ABNORMAL LOW (ref 36.0–46.0)
HEMOGLOBIN: 11.6 g/dL — AB (ref 12.0–15.0)
MCH: 29.3 pg (ref 26.0–34.0)
MCHC: 34.3 g/dL (ref 30.0–36.0)
MCV: 85.4 fL (ref 78.0–100.0)
Platelets: 158 10*3/uL (ref 150–400)
RBC: 3.96 MIL/uL (ref 3.87–5.11)
RDW: 13.2 % (ref 11.5–15.5)
WBC: 8.8 10*3/uL (ref 4.0–10.5)

## 2014-09-08 LAB — GLUCOSE, CAPILLARY: Glucose-Capillary: 206 mg/dL — ABNORMAL HIGH (ref 70–99)

## 2014-09-08 LAB — BASIC METABOLIC PANEL
ANION GAP: 7 (ref 5–15)
BUN: 18 mg/dL (ref 6–23)
CO2: 25 mmol/L (ref 19–32)
Calcium: 8.6 mg/dL (ref 8.4–10.5)
Chloride: 104 mmol/L (ref 96–112)
Creatinine, Ser: 0.91 mg/dL (ref 0.50–1.10)
GFR calc Af Amer: 89 mL/min — ABNORMAL LOW (ref >90)
GFR, EST NON AFRICAN AMERICAN: 77 mL/min — AB (ref >90)
GLUCOSE: 125 mg/dL — AB (ref 70–99)
POTASSIUM: 4 mmol/L (ref 3.5–5.1)
SODIUM: 136 mmol/L (ref 135–145)

## 2014-09-08 LAB — TROPONIN I: TROPONIN I: 0.06 ng/mL — AB (ref <0.031)

## 2014-09-08 MED ORDER — TICAGRELOR 90 MG PO TABS: 90.0000 mg | ORAL_TABLET | Freq: Two times a day (BID) | ORAL | Status: DC

## 2014-09-08 MED ORDER — ATORVASTATIN CALCIUM 80 MG PO TABS: 80.0000 mg | ORAL_TABLET | Freq: Every day | ORAL | Status: DC

## 2014-09-08 NOTE — Progress Notes (Signed)
CARDIAC REHAB PHASE I   PRE:  Rate/Rhythm: 82 SR  BP:  Supine: 118/61  Sitting:   Standing:    SaO2: 100%RA  MODE:  Ambulation: 500 ft   POST:  Rate/Rhythm: 100 SR  BP:  Supine:   Sitting: 113/77  Standing:    SaO2:  [id number] Pt walked 500 ft with steady gait. No CP. We saw pt in January to educate and she has not been able to make many changes. Stated time is a factor in exercising and does not have time for CRP 2 so declined. Pt has been counseled in smoking cessation and has cut down to 4 cigarettes a day. Pt plans to eventually quit. Reviewed NTG use,carb counting. Stressed importance of getting HGA1C down and make healthier food choices and walking for exercise. Pt stated all she can do is do the best she can. Discussed trying to make her health priority.   [name] Nuttingharlene Brodie Scovell, RN BSN  09/08/2014 9:20 AM

## 2014-09-08 NOTE — Discharge Instructions (Signed)
No driving for 24 hours. No lifting over 5 lbs for 1 week. No sexual activity for 1 week. You may return to work on 09/10/2014. Keep procedure site clean & dry. If you notice increased pain, swelling, bleeding or pus, call/return!  You may shower, but no soaking baths/hot tubs/pools for 1 week.   Please hold metformin for 48 hours after cath, you can restart metformin on 09/10/2014  Please pick up your Brilinta at Dr. Hazle CocaBerry's clinic to avoid any abrupt stop of Brilinta.

## 2014-09-08 NOTE — Discharge Summary (Signed)
Discharge Summary   Patient ID: [name],  MRN: [id number], DOB/AGE: [birth year] 44 y.o.  Admit date: 09/07/2014 Discharge date: 09/08/2014  Primary Care Provider: Nolene Ebbs A Primary Cardiologist: Dr. Avelina Laine. Irish Lack  Discharge Diagnoses Principal Problem:   Unstable angina Active Problems:   CAD S/P percutaneous coronary angioplasty   Allergies No Known Allergies  Procedures  Cardiac catheterization 09/07/2014 Stent: Synergy DES 2.25 mm x 20 mm;   16 Atm x 30 Sec, Post-dilation Balloon: Kaka Euphora 2.5 mm x 12 mm;   16 Atm x 45 Sec - prox stent, 16 Atm x 45 Sec distal stent  Final Diameter: 2.6 mm prox & 2.5 mm distal stent  POST-OPERATIVE DIAGNOSIS:   Severe multivessel, small vessel disease with new 95% lesion in the proximal RCA treated successfully with a synergy DES 2.25 mm x 20 mm stent postdilated 2.6 mm proximally & 2.5 mm distally.  Mild in-stent restenosis in the mid RCA synergy stent with roughly 40% stenosis in the ostial portion of the ostial/proximal circumflex stent.  Preserved LVEF of roughly 50% with mid to apical inferior severe hypokinesis.  PLAN OF CARE:  Standard post radial cath PCI care with TR band removal  Continue aspirin plus Brilinta - agree with the recommendation for lifelong dual platelet therapy.  Continue aggressive risk factor modification.  Expect discharge the morning.  Follow up with Dr. Gwenlyn Found or Dr. [facility]  The patient is a 44 year old AA female with past medical history of CAD, DM, HTN who presented [facility]07[facility]2014 with unstable angina. She was previously admitted in January 2016 and ruled in for MI, cardiac catheterization at that time revealed severe disease in proximal left circumflex and occluded mid left circumflex. There was also severe disease in the RCA and moderate disease in LAD. He had PCI of proximal and mid left circumflex as well as mid RCA with  drug-eluting stents. Echo in January 2016 showed a normal LV function. She started having recurrent chest discomfort several days prior to arrival. She was seen in the emergency room but left AGAINST MEDICAL ADVICE as she states she had to take her son to school. On arrival, her troponin was mildly elevated at 0.08. EKG showed increased inferior lateral T-wave inversion. Cardiac catheterization was planned, risk and benefit was discussed with the patient and she agreed to proceed.  Cardiac catheterization performed on 09/07/2014 showed EF 50%, focal lesion at the origin of AV groove branch at least 70-80%, 95% focal stenosis near proximal RCA, 90% ostial PDA stenosis. The culprit lesion was felt to be the 95% proximal RCA stenosis and was treated with balloon angioplasty and drug-eluting stent (Synergy DES 2.25 mm x 20 mm). Patient was seen in the following morning on 09/08/2014, at which time she denies any chest discomfort or shortness of breath. Her radial cath site appears to be stable. She is deemed stable for discharge from cardiology perspective. According to the patient, her previous Brilinta was delivered to Dr. Kennon Holter office and she is scheduled to pick it up soon. She still have 2 tablets of Brilinta at home, I have discussed with case manager, in order to avoid any abrupt stop of Brilinta, I will write her a prescription for 2 weeks of Brilinta until she can pick up her Brilinta at the office. I have also discussed with the patient the danger of stopping dual antiplatelet therapy without cardiologist guidance, I have asked her to contact cardiology office if she has  any problem obtaining her medication. She has been seen by cardiac rehabilitation prior to discharge. I will instruct the patient to hold metformin for at least 48 hours after cardiac cath, she can restart metformin on Monday, May 2.    Discharge Vitals Blood pressure 106/58, pulse 79, temperature 98.3 F (36.8 C), temperature source  Oral, resp. rate 19, height _0  (1.6 m), weight 165 lb (74.844 kg), SpO2 100 %.  Filed Weights   09/07/14 0939  Weight: 165 lb (74.844 kg)    Labs  CBC  Recent Labs  09/06/14 1656  09/07/14 1314 09/08/14 0022  WBC 7.9  < > 8.3 8.8  NEUTROABS 4.8  --   --   --   HGB 13.2  < > 13.0 11.6*  HCT 37.8  < > 38.2 33.8*  MCV 84.9  < > 85.3 85.4  PLT 145*  < > 184 158  < > = values in this interval not displayed. Basic Metabolic Panel  Recent Labs  09/07/14 0945 09/07/14 1314 09/08/14 0022  NA 131*  --  136  K 4.6  --  4.0  CL 98  --  104  CO2 21  --  25  GLUCOSE 483*  --  125*  BUN 17  --  18  CREATININE 0.97 0.77 0.91  CALCIUM 9.1  --  8.6   Liver Function Tests  Recent Labs  09/06/14 1656  AST 15  ALT 14  ALKPHOS 66  BILITOT 0.5  PROT 6.5  ALBUMIN 3.7    Recent Labs  09/06/14 1656  LIPASE 28   Cardiac Enzymes  Recent Labs  09/07/14 1314 09/07/14 1945 09/08/14 0022  TROPONINI 0.06* 0.06* 0.06*    Disposition  Pt is being discharged home today in good condition.  Follow-up Plans & Appointments      Follow-up Information    Follow up with Lorretta Harp, MD.   Specialty:  Cardiology   Why:  Office scheduler will contact you within 2 business days to arrange followup with Dr. Gwenlyn Found or his PA in the clinic in 2-4 weeks, please give [place] a call if you do not hear from [place].   Contact information:   [address] [place] [address] [phone]       Discharge Medications    Medication List    TAKE these medications        aspirin 81 MG tablet  Take 81 mg by mouth daily.     atorvastatin 80 MG tablet  Commonly known as:  LIPITOR  Take 1 tablet (80 mg total) by mouth daily at 6 PM.     insulin glargine 100 UNIT/ML injection  Commonly known as:  LANTUS  Inject 10 Units into the skin at bedtime.     lisinopril 10 MG tablet  Commonly known as:  PRINIVIL,ZESTRIL  Take 1 tablet (10 mg total) by mouth daily.      metFORMIN 500 MG tablet  Commonly known as:  GLUCOPHAGE  Take 1 tablet (500 mg total) by mouth 2 (two) times daily with a meal.     metoprolol tartrate 25 MG tablet  Commonly known as:  LOPRESSOR  Take 25 mg by mouth 2 (two) times daily.     nitroGLYCERIN 0.4 MG SL tablet  Commonly known as:  NITROSTAT  Place 1 tablet (0.4 mg total) under the tongue every 5 (five) minutes as needed for chest pain.     ticagrelor 90 MG Tabs tablet  Commonly  known as:  BRILINTA  Take 1 tablet (90 mg total) by mouth 2 (two) times daily.     trolamine salicylate 10 % cream  Commonly known as:  ASPERCREME  Apply 1 application topically as needed for muscle pain (Joint pain in left thumb).        Duration of Discharge Encounter   Greater than 30 minutes including physician time.  Hilbert Corrigan PA-C Pager: [phone] 09/08/2014, 9:51 AM

## 2014-09-08 NOTE — Progress Notes (Signed)
Patient ID: [name], female   DOB: January 19, 1971, 44 y.o.   MRN: [id number]    Primary cardiologist:  Subjective:    No complaints  Objective:   Temp:  [98.1 F (36.7 C)-98.8 F (37.1 C)] 98.1 F (36.7 C) (04/30 0400) Pulse Rate:  [68-84] 73 (04/30 0400) Resp:  [11-21] 19 (04/30 0400) BP: (104-151)/(51-85) 141/71 mmHg (04/30 0400) SpO2:  [100 %] 100 % (04/30 0400) Weight:  [165 lb (74.844 kg)] 165 lb (74.844 kg) (04/29 0939) Last BM Date: 09/07/14  Filed Weights   09/07/14 0939  Weight: 165 lb (74.844 kg)    Intake/Output Summary (Last 24 hours) at 09/08/14 0716 Last data filed at 09/07/14 1809  Gross per 24 hour  Intake    240 ml  Output      0 ml  Net    240 ml    Telemetry:  NSR  Exam:  General: NAD  Resp: CTAB  Cardiac: RRR, no m/r/g, no JVD  KG:MWNUUVOGI:abdomen soft, NT, ND  MSK:no LE edema  Neuro: no focal deficits    Lab Results:  Basic Metabolic Panel:  Recent Labs Lab 09/06/14 1656 09/07/14 0945 09/07/14 1314 09/08/14 0022  NA 130* 131*  --  136  K 4.3 4.6  --  4.0  CL 97 98  --  104  CO2 24 21  --  25  GLUCOSE 359* 483*  --  125*  BUN 22 17  --  18  CREATININE 0.98 0.97 0.77 0.91  CALCIUM 8.9 9.1  --  8.6    Liver Function Tests:  Recent Labs Lab 09/06/14 1656  AST 15  ALT 14  ALKPHOS 66  BILITOT 0.5  PROT 6.5  ALBUMIN 3.7    CBC:  Recent Labs Lab 09/07/14 0945 09/07/14 1314 09/08/14 0022  WBC 7.6 8.3 8.8  HGB 14.1 13.0 11.6*  HCT 40.9 38.2 33.8*  MCV 86.5 85.3 85.4  PLT 181 184 158    Cardiac Enzymes:  Recent Labs Lab 09/07/14 1314 09/07/14 1945 09/08/14 0022  TROPONINI 0.06* 0.06* 0.06*    BNP: No results for input(s): PROBNP in the last 8760 hours.  Coagulation:  Recent Labs Lab 09/07/14 0945  INR 0.97    ECG:   Medications:   Scheduled Medications: . [START ON 09/09/2014] aspirin EC  81 mg Oral Daily  . atorvastatin  80 mg Oral q1800  . insulin aspart  0-15 Units Subcutaneous TID WC   . insulin glargine  10 Units Subcutaneous QHS  . lisinopril  10 mg Oral Daily  . metoprolol tartrate  25 mg Oral BID  . sodium chloride  3 mL Intravenous Q12H  . ticagrelor  90 mg Oral BID     Infusions:     PRN Medications:  sodium chloride, sodium chloride, acetaminophen, morphine injection, nitroGLYCERIN, ondansetron (ZOFRAN) IV, sodium chloride     Assessment/Plan    44 yo female hx of CAD, DM2, HTN admitted with chest pain.  1. Chest pain -cath yesterday, s/p DES to RCA.  - medical therapy with ASA, brilinta, atorva 80, lisinopril, lopressor - post cath Cr 0.91, Hgb 11.6, trop 0.06. EKG stable inferior and lateral precordial TWI - no complaints this AM. Will plan for discharge today, will need 30 day free brillinta prescription. Educated on importance of DAPT compliance.  - f/u Dr Allyson SabalBerry 2 weeks      Dina RichJonathan Naria Abbey, M.D.

## 2014-09-09 NOTE — Care Management Note (Addendum)
    Page 1 of 1   09/09/2014     8:54:28 AM CARE MANAGEMENT NOTE 09/09/2014  Patient:  [name], [name]   Account Number:  [id number]  Date Initiated:  09/08/2014  Documentation initiated by:  [facility]  Subjective/Objective Assessment:   adm: Unstable Angina      Action/Plan:   discharge planning   Anticipated DC Date:  09/08/2014   Anticipated DC Plan:  [facility]  CM consult  [place] [facility]  Medication Assistance      Choice offered to / List presented to:             Status of service:  Completed, signed off Medicare Important Message given?   (If response is "NO", the following Medicare IM given date fields will be blank) Date Medicare IM given:   Medicare IM given by:   Date Additional Medicare IM given:   Additional Medicare IM given by:    Discharge Disposition:  HOME/SELF CARE  Per UR Regulation:    If discussed at Long Length of Stay Meetings, dates discussed:    Comments:  09/08/14 09:30 Cm met wit pt and gave pt [place]  letter with list of participating pharmacies.  Pt verbzlized understanding of MATCH  parameters.  Pt also received a free 30 day trial card for Brilinta.  Pt to follow with clinic (gave pamphlet) to pursue her Medicaid (lapsed).  No other Cm needs were communicated.  Mariane Masters, BSN, [place].

## 2014-09-10 ENCOUNTER — Encounter: Payer: Self-pay | Admitting: *Deleted

## 2014-09-10 ENCOUNTER — Telehealth: Payer: Self-pay | Admitting: Cardiovascular Disease

## 2014-09-10 NOTE — Telephone Encounter (Signed)
Letter to return to work 09/11/14,no lifting,pushing or pulling over 10 lbs for 1 week, [facility] to [facility] at fax # [phone].

## 2014-09-10 NOTE — Telephone Encounter (Signed)
Pt calling again,she needs her note for work. Please call asap.

## 2014-09-10 NOTE — Telephone Encounter (Signed)
Pt needs a note to go back to work. She will give you the details on what she needs.

## 2014-09-10 NOTE — Telephone Encounter (Signed)
Spoke to patient. She states she was instructed that she could return to work but no lifting >5 lbs, she was not sure if duration for this was 1 week or longer.  I do not see specific outline in note.  Will route to [place].  Pt requests work note be sent to fax #[phone] - [facility]. Their office phone # is [phone]

## 2014-09-10 NOTE — Telephone Encounter (Signed)
Samara DeistKathryn advised best wording: no lifting pushing or pulling >10 lbs x 1 week.  Will route per her recommendation to Wilburt FinlayBryan Hager for OK, signature.

## 2014-10-03 ENCOUNTER — Ambulatory Visit (INDEPENDENT_AMBULATORY_CARE_PROVIDER_SITE_OTHER): Payer: Self-pay | Admitting: Cardiovascular Disease

## 2014-10-03 ENCOUNTER — Encounter: Payer: Self-pay | Admitting: Cardiovascular Disease

## 2014-10-03 VITALS — BP 140/86 | HR 73 | Ht 63.0 in | Wt 166.1 lb

## 2014-10-03 DIAGNOSIS — I2510 Atherosclerotic heart disease of native coronary artery without angina pectoris: Secondary | ICD-10-CM

## 2014-10-03 DIAGNOSIS — I10 Essential (primary) hypertension: Secondary | ICD-10-CM

## 2014-10-03 DIAGNOSIS — I214 Non-ST elevation (NSTEMI) myocardial infarction: Secondary | ICD-10-CM

## 2014-10-03 DIAGNOSIS — Z9861 Coronary angioplasty status: Secondary | ICD-10-CM

## 2014-10-03 DIAGNOSIS — E785 Hyperlipidemia, unspecified: Secondary | ICD-10-CM

## 2014-10-03 DIAGNOSIS — E118 Type 2 diabetes mellitus with unspecified complications: Secondary | ICD-10-CM | POA: Insufficient documentation

## 2014-10-03 DIAGNOSIS — Z79899 Other long term (current) drug therapy: Secondary | ICD-10-CM

## 2014-10-03 NOTE — Assessment & Plan Note (Signed)
The patient has a history of CAD status post non-STEMI in January of this year with chronic catheterization revealing three-vessel disease. She underwent stenting of her proximal circumflex, mid circumflex, PDA and mid RCA. She was readmitted with chest pain 09/07/14 underwent cath by Dr. Herbie BaltimoreHarding. She had a drug-eluting stent placed in her proximal RCA. She does admit to not having taken her statin drugs and to continue continued tobacco abuse. She currently denies chest pain

## 2014-10-03 NOTE — Progress Notes (Signed)
10/03/2014 [name]   1970/06/06  [id number]  Primary Physician Dorrene GermanAVBUERE,EDWIN A, MD Primary Cardiologist: Runell GessJonathan J. Berry MD Roseanne RenoFACP,FACC,FAHA, FSCAI   HPI:  [name] is a 44 y.o. female with severe type 2 diabetes, hypertension and hyperlipidemia as well as obesity who presented back in January 2016 with a non-STEMI and was found to have multivessel disease. She underwent PCI to the proximal circumflex with PTCA of the mid circumflex that was 100 and occluded. She also had PTCA of the RPDA which was occluded followed by PCI of the mid RCA. Both stents were Synergy DES stents 2.25 mm in diameter (RCA 2.25 mm x 16 mm. Proximal Circumflex 2.25 mm x 12 mm). Her downstream vessels in the RCA as well as circumflex were very small in caliber and were not amenable maybe more the balloon angioplasty at the time. She was also noted to have severe diffuse disease in the distal LAD.  She she presented to [facility] emergency room on April 28 with symptoms concerning for unstable angina. Unfortunately because of family situation she needed to go home to ensure that her son was cared for. She left AGAINST MEDICAL ADVICE and now presents back with chest pain and is referred for cardiac catheterization plus minus PCI.she underwent cardiac catheterization by Dr. Herbie BaltimoreHarding revealing patent stents with high-grade proximal RCA disease which was stented with a drug-eluting stent. She does admit to not taking her statin drug for routine basis and does continue to smoke.   Current Outpatient Prescriptions  Medication Sig Dispense Refill  . aspirin 81 MG tablet Take 81 mg by mouth daily.    Marland Kitchen. atorvastatin (LIPITOR) 80 MG tablet Take 1 tablet (80 mg total) by mouth daily at 6 PM. 30 tablet 5  . insulin glargine (LANTUS) 100 UNIT/ML injection Inject 10 Units into the skin at bedtime.    Marland Kitchen. lisinopril (PRINIVIL,ZESTRIL) 10 MG tablet Take 1 tablet (10 mg total) by mouth daily. 30 tablet 0  . metFORMIN  (GLUCOPHAGE) 500 MG tablet Take 1 tablet (500 mg total) by mouth 2 (two) times daily with a meal. 60 tablet 0  . metoprolol tartrate (LOPRESSOR) 25 MG tablet Take 25 mg by mouth 2 (two) times daily.    . nitroGLYCERIN (NITROSTAT) 0.4 MG SL tablet Place 1 tablet (0.4 mg total) under the tongue every 5 (five) minutes as needed for chest pain. 30 tablet 6  . ticagrelor (BRILINTA) 90 MG TABS tablet Take 1 tablet (90 mg total) by mouth 2 (two) times daily. 28 tablet 0  . trolamine salicylate (ASPERCREME) 10 % cream Apply 1 application topically as needed for muscle pain (Joint pain in left thumb).     No current facility-administered medications for this visit.    No Known Allergies  History   Social History  . Marital Status: Single    Spouse Name: N/A  . Number of Children: 2  . Years of Education: N/A   Occupational History  . Not on file.   Social History Main Topics  . Smoking status: Current Every Day Smoker -- 0.25 packs/day    Types: Cigarettes  . Smokeless tobacco: Never Used  . Alcohol Use: No  . Drug Use: No  . Sexual Activity: Not on file   Other Topics Concern  . Not on file   Social History Narrative     Review of Systems: General: negative for chills, fever, night sweats or weight changes.  Cardiovascular: negative for chest pain, dyspnea  on exertion, edema, orthopnea, palpitations, paroxysmal nocturnal dyspnea or shortness of breath Dermatological: negative for rash Respiratory: negative for cough or wheezing Urologic: negative for hematuria Abdominal: negative for nausea, vomiting, diarrhea, bright red blood per rectum, melena, or hematemesis Neurologic: negative for visual changes, syncope, or dizziness All other systems reviewed and are otherwise negative except as noted above.    Blood pressure 140/86, pulse 73, height  (1.6 m), weight 166 lb 1.6 oz (75.342 kg).  General appearance: alert and no distress Neck: no adenopathy, no carotid bruit, no  JVD, supple, symmetrical, trachea midline and thyroid not enlarged, symmetric, no tenderness/mass/nodules Lungs: clear to auscultation bilaterally Heart: regular rate and rhythm, S1, S2 normal, no murmur, click, rub or gallop Extremities: extremities normal, atraumatic, no cyanosis or edema  EKG normal sinus rhythm at 72  ASSESSMENT AND PLAN:   NSTEMI (non-ST elevated myocardial infarction) The patient has a history of CAD status post non-STEMI in January of this year with chronic catheterization revealing three-vessel disease. She underwent stenting of her proximal circumflex, mid circumflex, PDA and mid RCA. She was readmitted with chest pain 09/07/14 underwent cath by Dr. Herbie Baltimore. She had a drug-eluting stent placed in her proximal RCA. She does admit to not having taken her statin drugs and to continue continued tobacco abuse. She currently denies chest pain       Runell Gess MD [facility], [facility] 10/03/2014 12:29 PM

## 2014-10-03 NOTE — Assessment & Plan Note (Signed)
Currently on statin therapy. We will recheck a lipid liver profile

## 2014-10-03 NOTE — Assessment & Plan Note (Signed)
History of hypertension with blood pressure measured today 140/86. She is on lisinopril and Lopressor. Continue current meds at current dosing

## 2014-10-03 NOTE — Patient Instructions (Signed)
  We will see you back in follow up in 6 months with an extender and 1 year with Dr Berry.   Dr Berry has ordered: A FASTING lipid profile: to be done at your convenience.  There is a [facility] lab on the first floor of this building, [address].  They are open from 8am-5pm with a lunch from 12-2.  You do not need an appointment.      

## 2014-10-18 ENCOUNTER — Ambulatory Visit: Payer: Self-pay | Attending: Internal Medicine

## 2014-11-13 ENCOUNTER — Telehealth: Payer: Self-pay | Admitting: Cardiovascular Disease

## 2014-11-13 NOTE — Telephone Encounter (Signed)
Called pt, notified that no Brilinta samples in stock for her dose strength - to call back later this week or the next - she voiced understanding.

## 2014-11-13 NOTE — Telephone Encounter (Signed)
Patient calling the office for samples of medication:   1.  What medication and dosage are you requesting samples for Brilinta  2.  Are you currently out of this medication?about a week left  3. Are you requesting samples to get you through until a mail order prescription arrives?no

## 2014-11-19 ENCOUNTER — Telehealth: Payer: Self-pay | Admitting: Cardiovascular Disease

## 2014-11-19 NOTE — Telephone Encounter (Signed)
Patient calling the office for samples of medication: ° ° °1.  What medication and dosage are you requesting samples for? Brilinta  ° °2.  Are you currently out of this medication? Yes  ° °3. Are you requesting samples to get you through until a mail order prescription arrives? No ° ° °

## 2014-11-19 NOTE — Telephone Encounter (Signed)
Medication samples have been provided to the patient.  Drug name: brilinta [id number]  Qty: 48  LOT: [id number]  Exp.Date: 03/2017  Samples left at front desk for patient pick-up. Patient notified.  Julaine Fusilkins, Jenna M 3:23 PM 11/19/2014

## 2014-12-13 ENCOUNTER — Telehealth: Payer: Self-pay | Admitting: Cardiovascular Disease

## 2014-12-13 MED ORDER — METOPROLOL TARTRATE 25 MG PO TABS: 25.0000 mg | ORAL_TABLET | Freq: Two times a day (BID) | ORAL | Status: DC

## 2014-12-13 MED ORDER — TICAGRELOR 90 MG PO TABS: 90.0000 mg | ORAL_TABLET | Freq: Two times a day (BID) | ORAL | Status: DC

## 2014-12-13 NOTE — Telephone Encounter (Signed)
[name] is calling to get some samples of Brilinta . Please call   Thanks

## 2014-12-13 NOTE — Telephone Encounter (Signed)
Metoprolol Rx(s) sent to pharmacy electronically.  Advised patient to contact [employer] today to inquire about samples - none available at [employer] She states she will be taking her last pill tomorrow evening.

## 2014-12-13 NOTE — Telephone Encounter (Signed)
Spoke with pt, able to give a small supply of brilinta. Will not be able to guarantee cont samples. Will forward to dr berry to possibility change to a different medication

## 2014-12-13 NOTE — Telephone Encounter (Signed)
Pt said that she called the [facility] location to see if they had any samples of Brilinta and they did not. So now she would like to know that to do since she can not afford this medication. Please call  Thanks

## 2014-12-13 NOTE — Telephone Encounter (Signed)
Please have her come in to see Belenda Cruise discuss samples and/or free coupons for [employer].

## 2014-12-14 NOTE — Telephone Encounter (Signed)
Spoke with patient, she is uninsured but working, so does not qualify for [employer].  Will have her fill out [place] patient assistance forms, will leave at front desk and she'll come in on Wednesday to fill out and leave a copy of her tax forms.

## 2014-12-14 NOTE — Telephone Encounter (Signed)
Has this been taken care of?

## 2014-12-14 NOTE — Telephone Encounter (Signed)
[name], can you please direct me as to what documentation this patient needs to apply for medication assistance.

## 2014-12-26 ENCOUNTER — Telehealth: Payer: Self-pay | Admitting: Cardiovascular Disease

## 2014-12-26 NOTE — Telephone Encounter (Signed)
Medication samples have been provided to the patient.  Drug name: brilinta 43  Qty: 48  LOT: [id number]  Exp.Date: 03/2017  Samples left at front desk for patient pick-up. Patient notified.  Julaine Fusi M 1:57 PM 12/26/2014

## 2014-12-26 NOTE — Telephone Encounter (Signed)
Patient calling the office for samples of medication:   1.  What medication and dosage are you requesting samples for? Brilinta  2.  Are you currently out of this medication?no-enough until Sunday  3. Are you requesting samples to get you through until a mail order prescription arrives?no

## 2015-01-31 ENCOUNTER — Ambulatory Visit (HOSPITAL_COMMUNITY): Admit: 2015-01-31 | Payer: Self-pay | Admitting: Cardiovascular Disease

## 2015-01-31 ENCOUNTER — Telehealth: Payer: Self-pay | Admitting: Cardiovascular Disease

## 2015-01-31 ENCOUNTER — Encounter (HOSPITAL_COMMUNITY): Payer: Self-pay | Admitting: Emergency Medicine

## 2015-01-31 ENCOUNTER — Emergency Department (EMERGENCY_DEPARTMENT_HOSPITAL)
Admission: EM | Admit: 2015-01-31 | Discharge: 2015-01-31 | Discharge status: Against Medical Advice | Payer: Medicaid Other | Source: Home / Self Care | Attending: Emergency Medicine | Admitting: Emergency Medicine

## 2015-01-31 ENCOUNTER — Encounter (HOSPITAL_COMMUNITY)
Admission: EM | Discharge status: Against Medical Advice | Payer: Self-pay | Source: Home / Self Care | Attending: Emergency Medicine

## 2015-01-31 ENCOUNTER — Emergency Department (HOSPITAL_COMMUNITY): Payer: Medicaid Other

## 2015-01-31 DIAGNOSIS — E119 Type 2 diabetes mellitus without complications: Secondary | ICD-10-CM | POA: Insufficient documentation

## 2015-01-31 DIAGNOSIS — I200 Unstable angina: Secondary | ICD-10-CM

## 2015-01-31 DIAGNOSIS — I252 Old myocardial infarction: Secondary | ICD-10-CM | POA: Insufficient documentation

## 2015-01-31 DIAGNOSIS — Z87891 Personal history of nicotine dependence: Secondary | ICD-10-CM | POA: Insufficient documentation

## 2015-01-31 DIAGNOSIS — Z9119 Patient's noncompliance with other medical treatment and regimen: Secondary | ICD-10-CM | POA: Insufficient documentation

## 2015-01-31 DIAGNOSIS — I10 Essential (primary) hypertension: Secondary | ICD-10-CM

## 2015-01-31 DIAGNOSIS — I2510 Atherosclerotic heart disease of native coronary artery without angina pectoris: Secondary | ICD-10-CM

## 2015-01-31 DIAGNOSIS — Z794 Long term (current) use of insulin: Secondary | ICD-10-CM | POA: Insufficient documentation

## 2015-01-31 DIAGNOSIS — Z7982 Long term (current) use of aspirin: Secondary | ICD-10-CM

## 2015-01-31 DIAGNOSIS — E785 Hyperlipidemia, unspecified: Secondary | ICD-10-CM | POA: Insufficient documentation

## 2015-01-31 DIAGNOSIS — Z79899 Other long term (current) drug therapy: Secondary | ICD-10-CM

## 2015-01-31 DIAGNOSIS — R079 Chest pain, unspecified: Secondary | ICD-10-CM

## 2015-01-31 DIAGNOSIS — Z9889 Other specified postprocedural states: Secondary | ICD-10-CM | POA: Insufficient documentation

## 2015-01-31 DIAGNOSIS — I25111 Atherosclerotic heart disease of native coronary artery with angina pectoris with documented spasm: Secondary | ICD-10-CM

## 2015-01-31 LAB — CBC
HCT: 41.4 % (ref 36.0–46.0)
HEMOGLOBIN: 13.9 g/dL (ref 12.0–15.0)
MCH: 29.3 pg (ref 26.0–34.0)
MCHC: 33.6 g/dL (ref 30.0–36.0)
MCV: 87.2 fL (ref 78.0–100.0)
Platelets: 195 10*3/uL (ref 150–400)
RBC: 4.75 MIL/uL (ref 3.87–5.11)
RDW: 13.6 % (ref 11.5–15.5)
WBC: 9.9 10*3/uL (ref 4.0–10.5)

## 2015-01-31 LAB — BASIC METABOLIC PANEL
ANION GAP: 9 (ref 5–15)
BUN: 11 mg/dL (ref 6–20)
CO2: 24 mmol/L (ref 22–32)
Calcium: 9 mg/dL (ref 8.9–10.3)
Chloride: 97 mmol/L — ABNORMAL LOW (ref 101–111)
Creatinine, Ser: 0.87 mg/dL (ref 0.44–1.00)
Glucose, Bld: 347 mg/dL — ABNORMAL HIGH (ref 65–99)
POTASSIUM: 4 mmol/L (ref 3.5–5.1)
Sodium: 130 mmol/L — ABNORMAL LOW (ref 135–145)

## 2015-01-31 LAB — LIPID PANEL
Cholesterol: 111 mg/dL (ref 0–200)
HDL: 38 mg/dL — AB (ref >40)
LDL CALC: 50 mg/dL (ref 0–99)
Total CHOL/HDL Ratio: 2.9 RATIO
Triglycerides: 114 mg/dL (ref <150)
VLDL: 23 mg/dL (ref 0–40)

## 2015-01-31 LAB — I-STAT TROPONIN, ED: Troponin i, poc: 0.03 ng/mL (ref 0.00–0.08)

## 2015-01-31 SURGERY — LEFT HEART CATH AND CORONARY ANGIOGRAPHY

## 2015-01-31 MED ORDER — ASPIRIN 81 MG PO CHEW
324.0000 mg | CHEWABLE_TABLET | Freq: Once | ORAL | Status: AC
  Administered 2015-01-31: 324 mg via ORAL
  Filled 2015-01-31: qty 4

## 2015-01-31 MED ORDER — ROSUVASTATIN CALCIUM 20 MG PO TABS: 20.0000 mg | ORAL_TABLET | Freq: Every day | ORAL | Status: DC

## 2015-01-31 NOTE — H&P (Signed)
ADMISSION HISTORY AND PHYSICAL   Date: 01/31/2015               Patient Name:  [name] MRN: [id number]  DOB: 15-Jan-1971 Age / Sex: 44 y.o., female        PCP: Fleet Contras A Primary Cardiologist: Allyson Sabal         History of Present Illness: Patient is a 44 y.o. female with a PMHx of CAD, HTN, DM2, hyperlipidemia   cigarette smoking  , who was admitted to [facility] on 01/31/2015 for evaluation of chest pain.   2 weeks of progressive angina. Worse with exertion.  Radiates out her left arm . Relieved with SL NTG. Pressure, burning like pain, radiates through to her back  Would last a minute or so , the pain eases up if she stops walking ,. + associated witih increased dyspnea,.  Quit smoking 1 1/2 weeks ago .   Medications: Outpatient medications:  (Not in a hospital admission)  No Known Allergies   Past Medical History  Diagnosis Date  . Hypertension   . Diabetes mellitus without complication   . MI (myocardial infarction)   . CAD (coronary artery disease)     cath 09/07/2014 DES to 95% prox RCA, residual 70-80% origin of AV groove, 90% ostial PDA stenosis  . Hyperlipidemia   . Tobacco abuse   . H/O medication noncompliance     Past Surgical History  Procedure Laterality Date  . Left heart catheterization with coronary angiogram N/A 05/29/2014    Procedure: LEFT HEART CATHETERIZATION WITH CORONARY ANGIOGRAM;  Surgeon: Corky Crafts, MD;  Location: [facility] CATH LAB;  Service: Cardiovascular;  Laterality: N/A;  . Percutaneous coronary stent intervention (pci-s)  05/29/2014    Procedure: PERCUTANEOUS CORONARY STENT INTERVENTION (PCI-S);  Surgeon: Corky Crafts, MD;  Location: [facility] CATH LAB;  Service: Cardiovascular;;  . Abdominal hysterectomy    . Left heart catheterization with coronary angiogram N/A 09/07/2014    Procedure: LEFT HEART CATHETERIZATION WITH CORONARY ANGIOGRAM;  Surgeon: Marykay Lex, MD;  Location: [facility] CATH LAB;  Service: Cardiovascular;   Laterality: N/A;    Family History  Problem Relation Age of Onset  . Hypertension Mother   . Kidney failure Mother   . Heart attack Father   . Heart disease Father     Social History:  reports that she has quit smoking. Her smoking use included Cigarettes. She smoked 0.00 packs per day. She has never used smokeless tobacco. She reports that she does not drink alcohol or use illicit drugs.   Review of Systems: Constitutional:  denies fever, chills, diaphoresis, appetite change and fatigue.  HEENT: denies photophobia, eye pain, redness, hearing loss, ear pain, congestion, sore throat, rhinorrhea, sneezing, neck pain, neck stiffness and tinnitus.  Respiratory: denies SOB, DOE, cough, chest tightness, and wheezing.  Cardiovascular: admits to chest pain, palpitations.  She denies  leg swelling.  Gastrointestinal: denies nausea, vomiting, abdominal pain, diarrhea, constipation, blood in stool.  Genitourinary: denies dysuria, urgency, frequency, hematuria, flank pain and difficulty urinating.  Musculoskeletal: denies  myalgias, back pain, joint swelling, arthralgias and gait problem.   Skin: denies pallor, rash and wound.  Neurological: denies dizziness, seizures, syncope, weakness, light-headedness, numbness and headaches.   Hematological: denies adenopathy, easy bruising, personal or family bleeding history.  Psychiatric/ Behavioral: denies suicidal ideation, mood changes, confusion, nervousness, sleep disturbance and agitation.    Physical Exam: BP 175/106 mmHg  Pulse 87  Temp(Src) 98.6 F (37 C) (Oral)  Resp 18  Ht  (1.6 m)  Wt 75.297 kg (166 lb)  BMI 29.41 kg/m2  SpO2 100%  Wt Readings from Last 3 Encounters:  01/31/15 75.297 kg (166 lb)  10/03/14 75.342 kg (166 lb 1.6 oz)  09/07/14 74.844 kg (165 lb)    General: Vital signs reviewed and noted. Well-developed, well-nourished, in no acute distress; alert,   Head: Normocephalic, atraumatic, sclera anicteric, mucus  membranes are moist   Neck: Supple. Negative for carotid bruits. JVD not elevated.   Lungs:  Clear bilaterally to auscultation without wheezes, rales, or rhonchi. Breathing is normal   Heart: RRR with S1 S2. No murmurs, rubs, or gallops.   Abdomen:  Soft, non-tender, non-distended with normoactive bowel sounds. No hepatomegaly. No rebound/guarding. No obvious abdominal masses   MSK: Strength and the appear normal for age.   Extremities: No clubbing or cyanosis. No edema.  Distal pedal pulses are 2+ and equal bilaterally .  Neurologic: Alert and oriented X 3. Moves all extremities spontaneously   Psych:  normal     Lab results: Basic Metabolic Panel:  Recent Labs Lab 01/31/15 0900  NA 130*  K 4.0  CL 97*  CO2 24  GLUCOSE 347*  BUN 11  CREATININE 0.87  CALCIUM 9.0    Liver Function Tests: No results for input(s): AST, ALT, ALKPHOS, BILITOT, PROT, ALBUMIN in the last 168 hours. No results for input(s): LIPASE, AMYLASE in the last 168 hours.  CBC:  Recent Labs Lab 01/31/15 0900  WBC 9.9  HGB 13.9  HCT 41.4  MCV 87.2  PLT 195    Cardiac Enzymes: No results for input(s): CKTOTAL, CKMB, CKMBINDEX, TROPONINI in the last 168 hours.  BNP: Invalid input(s): POCBNP  CBG: No results for input(s): GLUCAP in the last 168 hours.  Coagulation Studies: No results for input(s): LABPROT, INR in the last 72 hours.   Other results:  EKG  -NSR at 81,.  NS ST abn. ( new from previous ECG)    Imaging: Dg Chest 2 View  01/31/2015   CLINICAL DATA:  44 year old female with acute chest pain for several weeks.  EXAM: CHEST  2 VIEW  COMPARISON:  09/06/2014 and prior radiographs  FINDINGS: The cardiomediastinal silhouette is unremarkable.  There is no evidence of focal airspace disease, pulmonary edema, suspicious pulmonary nodule/mass, pleural effusion, or pneumothorax. No acute bony abnormalities are identified.  IMPRESSION: No active cardiopulmonary disease.   Electronically  Signed   By: Harmon Pier M.D.   On: 01/31/2015 09:18        Assessment & Plan:  1. Unstable angina :    Pt presents with symptoms c/w her unstable angina . CP with exertion, relieved with rest and with SL NTG. She has mild NS ST abnormalities in her lateral leads.  She just quit smoking 1-2 weeks ago.  Ive discussed risks / benefits / options of cardiac cath.  She understands and agrees to proceed.  Will anticipate cath later today   2. Hyperlipidemia:    LDL is still very high. She has not been careful with her diet. Change atorvastatin to crestor 20   DVT PPX - low risk, going for cath in several hours. On Cyd Silence., MD, [facility] 01/31/2015, 9:45 AM

## 2015-01-31 NOTE — ED Notes (Signed)
Pt placed in gown and in bed. Pt monitored by pulse ox, bp cuff, and 12-lead. 

## 2015-01-31 NOTE — ED Notes (Signed)
Pt reports she can't stay to wait for cath lab. Pt complaining she's hungry and the room is hot and she will just come back tomorrow. Notified Trish from Cardiology who verbally acknowledged pt will leave. She requests patient sign AMA form.

## 2015-01-31 NOTE — ED Notes (Signed)
Patient states she has had chest pain for a couple of weeks. Pain is in center of chest, radiates left chest and back. States if she walks around a lot her chest starts hurting. Denies nausea, vomiting, dizziness. Hx of MI.

## 2015-01-31 NOTE — Telephone Encounter (Signed)
Pt called in wanting to speak with the nurse about her recent ER visit. She was admitted for chest pain but the doctor said that she may possibly need another stent and she may need to change her cholesterol medicine. Please call  Thanks

## 2015-01-31 NOTE — ED Notes (Signed)
Patient transported to X-ray 

## 2015-01-31 NOTE — ED Notes (Signed)
Pt signed AMA form, PIV removed and patient left ED with steady gait.

## 2015-01-31 NOTE — ED Provider Notes (Signed)
CSN: [id number]     Arrival date & time 01/31/15  4098 History   First MD Initiated Contact with Patient 01/31/15 [phone]     Chief Complaint  Patient presents with  . Chest Pain     (Consider location/radiation/quality/duration/timing/severity/associated sxs/prior Treatment) Patient is a 44 y.o. female presenting with chest pain. The history is provided by the patient.  Chest Pain Pain location:  Substernal area Pain quality: burning and sharp   Pain quality: no pressure and no tightness   Pain radiates to:  R arm (tingling only, no pain) Pain radiates to the back: no   Pain severity:  Moderate Onset quality:  Gradual Duration:  4 days Timing:  Intermittent Progression:  Waxing and waning Chronicity:  New Context: at rest (at night)   Context: no movement   Relieved by:  Nitroglycerin (heating pad) Worsened by:  Exertion Associated symptoms: no abdominal pain, no fever, no lower extremity edema, no shortness of breath and not vomiting   Risk factors: coronary artery disease, high cholesterol and hypertension   Risk factors: no smoking     Past Medical History  Diagnosis Date  . Hypertension   . Diabetes mellitus without complication   . MI (myocardial infarction)   . CAD (coronary artery disease)     cath 09/07/2014 DES to 95% prox RCA, residual 70-80% origin of AV groove, 90% ostial PDA stenosis  . Hyperlipidemia   . Tobacco abuse   . H/O medication noncompliance    Past Surgical History  Procedure Laterality Date  . Left heart catheterization with coronary angiogram N/A 05/29/2014    Procedure: LEFT HEART CATHETERIZATION WITH CORONARY ANGIOGRAM;  Surgeon: Corky Crafts, MD;  Location[facility] C[facility] LAB;  Service: Cardiovascular;  Laterality: N/A;  . Percutaneous coronary stent intervention (pci-s)  05/29/2014    Procedure: PERCUTANEOUS CORONARY STENT INTERVENTION (PCI-S);  Surgeon: Corky Crafts, MD;  Location: [facility] CATH LAB;  Service: Cardiovascular;;  . Abdominal  hysterectomy    . Left heart catheterization with coronary angiogram N/A 09/07/2014    Procedure: LEFT HEART CATHETERIZATION WITH CORONARY ANGIOGRAM;  Surgeon: Marykay Lex, MD;  Location: [facility] CATH LAB;  Service: Cardiovascular;  Laterality: N/A;   Family History  Problem Relation Age of Onset  . Hypertension Mother   . Kidney failure Mother   . Heart attack Father   . Heart disease Father    Social History  Substance Use Topics  . Smoking status: Former Smoker -- 0.00 packs/day    Types: Cigarettes  . Smokeless tobacco: Never Used  . Alcohol Use: No   OB History    No data available     Review of Systems  Constitutional: Negative for fever.  Respiratory: Negative for shortness of breath.   Cardiovascular: Positive for chest pain.  Gastrointestinal: Negative for vomiting and abdominal pain.  All other systems reviewed and are negative.     Allergies  Review of patient's allergies indicates no known allergies.  Home Medications   Prior to Admission medications   Medication Sig Start Date End Date Taking? Authorizing Provider  aspirin EC 81 MG tablet Take 81 mg by mouth daily.   Yes Historical Provider, MD  atorvastatin (LIPITOR) 80 MG tablet Take 1 tablet (80 mg total) by mouth daily at 6 PM. Patient taking differently: Take 80 mg by mouth daily.  09/08/14  Yes Azalee Course, PA  insulin glargine (LANTUS) 100 UNIT/ML injection Inject 20 Units into the skin at bedtime.    Yes Historical  Provider, MD  lisinopril (PRINIVIL,ZESTRIL) 10 MG tablet Take 1 tablet (10 mg total) by mouth daily. 03/11/14  Yes Ria Clock, PA  metFORMIN (GLUCOPHAGE) 500 MG tablet Take 1 tablet (500 mg total) by mouth 2 (two) times daily with a meal. 06/02/14  Yes Brittainy Sherlynn Carbon, PA-C  metoprolol tartrate (LOPRESSOR) 25 MG tablet Take 1 tablet (25 mg total) by mouth 2 (two) times daily. 12/13/14  Yes Runell Gess, MD  nitroGLYCERIN (NITROSTAT) 0.4 MG SL tablet Place 1 tablet (0.4 mg total)  under the tongue every 5 (five) minutes as needed for chest pain. 07/31/14  Yes Leone Brand, NP  Omega-3 Fatty Acids (FISH OIL) 1000 MG CAPS Take 1,000 mg by mouth at bedtime.   Yes Historical Provider, MD  ticagrelor (BRILINTA) 90 MG TABS tablet Take 1 tablet (90 mg total) by mouth 2 (two) times daily. 12/13/14  Yes Runell Gess, MD  trolamine salicylate (ASPERCREME) 10 % cream Apply 1 application topically as needed for muscle pain (Joint pain in left thumb).   Yes Historical Provider, MD  aspirin 81 MG tablet Take 81 mg by mouth daily.    Historical Provider, MD   BP 175/106 mmHg  Pulse 87  Temp(Src) 98.6 F (37 C) (Oral)  Resp 18  Ht  (1.6 m)  Wt 166 lb (75.297 kg)  BMI 29.41 kg/m2  SpO2 100% Physical Exam  Constitutional: She is oriented to person, place, and time. She appears well-developed and well-nourished. No distress.  HENT:  Head: Normocephalic.  Eyes: Conjunctivae are normal.  Neck: Neck supple. No tracheal deviation present.  Cardiovascular: Normal rate, regular rhythm and normal heart sounds.   Pulmonary/Chest: Effort normal and breath sounds normal. No respiratory distress. She has no wheezes. She has no rales. She exhibits no tenderness.  Abdominal: Soft. She exhibits no distension.  Neurological: She is alert and oriented to person, place, and time.  Skin: Skin is warm and dry.  Psychiatric: She has a normal mood and affect.    ED Course  Procedures (including critical care time) Labs Review Labs Reviewed  BASIC METABOLIC PANEL - Abnormal; Notable for the following:    Sodium 130 (*)    Chloride 97 (*)    Glucose, Bld 347 (*)    All other components within normal limits  CBC  I-STAT TROPOININ, ED    Imaging Review Dg Chest 2 View  01/31/2015   CLINICAL DATA:  44 year old female with acute chest pain for several weeks.  EXAM: CHEST  2 VIEW  COMPARISON:  09/06/2014 and prior radiographs  FINDINGS: The cardiomediastinal silhouette is unremarkable.   There is no evidence of focal airspace disease, pulmonary edema, suspicious pulmonary nodule/mass, pleural effusion, or pneumothorax. No acute bony abnormalities are identified.  IMPRESSION: No active cardiopulmonary disease.   Electronically Signed   By: Harmon Pier M.D.   On: 01/31/2015 09:18   I have personally reviewed and evaluated these images and lab results as part of my medical decision-making.   EKG Interpretation   Date/Time:  Thursday January 31 2015 08:41:58 EDT Ventricular Rate:  81 PR Interval:  142 QRS Duration: 94 QT Interval:  382 QTC Calculation: 443 R Axis:   14 Text Interpretation:  Normal sinus rhythm ST \\T \ T wave abnormality,  consider lateral ischemia Abnormal ECG Confirmed by KNOTT MD, DANIEL  ([id number]) on 01/31/2015 9:37:48 AM      MDM   Final diagnoses:  Chest pain, unspecified chest pain type  44 year old female with history of coronary artery disease status post multiple stents presents with chest pain typical of prior episodes of unstable angina requiring percutaneous intervention. For the last 4 days she has been having intermittent chest pain worse with exertion, is starting to have the pain at rest especially at night, it is relieved by nitroglycerin. The patient is hypertensive on arrival but asymptomatic. Provided aspirin for possible ACS, due to high risk history and features cardiology was consulted and will see the patient in the emergency department.    Lyndal Pulley, MD 01/31/15 [phone]

## 2015-01-31 NOTE — Telephone Encounter (Signed)
Pt apparently left the ER AMA instead of staying for her cardiac cath. I found out about this when the patient called back to the office asking what she should do. I've recommneded that she return to the ER but that I doubt she would be cathed today since she has eaten. She will need to follow up with Dr. Allyson Sabal for further work up .

## 2015-01-31 NOTE — Telephone Encounter (Signed)
Patient was seen in ED and evaluated by cardilogist Dr. Melburn Popper. And was to have cardiac cath this afternoon  Patient told me she had been there since morning she was hungry and hot and she didn't want to stay any longer.   Dr. Elease Hashimoto called.  Advised patient to go back to the ED   Explained to patient the reason she did not have anything to ear is because they do not want her to have anything in her stomach prior to procedure  She said when her son get home she will have him drive her there  I instructed her that if she had anymore chest pain, shortness of breath and/or nausea she needs to call 9-1-1

## 2015-02-01 ENCOUNTER — Encounter (HOSPITAL_COMMUNITY)
Admission: EM | Disposition: A | Discharge status: Home / Self Care | Payer: Self-pay | Source: Home / Self Care | Attending: Cardiothoracic Surgery

## 2015-02-01 ENCOUNTER — Other Ambulatory Visit: Payer: Self-pay | Admitting: *Deleted

## 2015-02-01 ENCOUNTER — Encounter (HOSPITAL_COMMUNITY): Payer: Self-pay

## 2015-02-01 ENCOUNTER — Inpatient Hospital Stay (HOSPITAL_COMMUNITY)
Admission: EM | Admit: 2015-02-01 | Discharge: 2015-02-16 | DRG: 233 | Disposition: A | Discharge status: Home / Self Care | Payer: Medicaid Other | Attending: Cardiothoracic Surgery | Admitting: Cardiothoracic Surgery

## 2015-02-01 DIAGNOSIS — F17210 Nicotine dependence, cigarettes, uncomplicated: Secondary | ICD-10-CM | POA: Diagnosis present

## 2015-02-01 DIAGNOSIS — Z955 Presence of coronary angioplasty implant and graft: Secondary | ICD-10-CM

## 2015-02-01 DIAGNOSIS — I97120 Postprocedural cardiac arrest following cardiac surgery: Secondary | ICD-10-CM | POA: Diagnosis not present

## 2015-02-01 DIAGNOSIS — Y840 Cardiac catheterization as the cause of abnormal reaction of the patient, or of later complication, without mention of misadventure at the time of the procedure: Secondary | ICD-10-CM | POA: Diagnosis not present

## 2015-02-01 DIAGNOSIS — Z8249 Family history of ischemic heart disease and other diseases of the circulatory system: Secondary | ICD-10-CM | POA: Diagnosis not present

## 2015-02-01 DIAGNOSIS — I252 Old myocardial infarction: Secondary | ICD-10-CM

## 2015-02-01 DIAGNOSIS — I454 Nonspecific intraventricular block: Secondary | ICD-10-CM | POA: Diagnosis present

## 2015-02-01 DIAGNOSIS — E785 Hyperlipidemia, unspecified: Secondary | ICD-10-CM | POA: Diagnosis present

## 2015-02-01 DIAGNOSIS — I255 Ischemic cardiomyopathy: Secondary | ICD-10-CM | POA: Diagnosis present

## 2015-02-01 DIAGNOSIS — Z794 Long term (current) use of insulin: Secondary | ICD-10-CM | POA: Diagnosis not present

## 2015-02-01 DIAGNOSIS — T8111XA Postprocedural  cardiogenic shock, initial encounter: Secondary | ICD-10-CM | POA: Diagnosis not present

## 2015-02-01 DIAGNOSIS — D6959 Other secondary thrombocytopenia: Secondary | ICD-10-CM | POA: Diagnosis not present

## 2015-02-01 DIAGNOSIS — I5043 Acute on chronic combined systolic (congestive) and diastolic (congestive) heart failure: Secondary | ICD-10-CM | POA: Diagnosis not present

## 2015-02-01 DIAGNOSIS — Z6829 Body mass index (BMI) 29.0-29.9, adult: Secondary | ICD-10-CM

## 2015-02-01 DIAGNOSIS — Y832 Surgical operation with anastomosis, bypass or graft as the cause of abnormal reaction of the patient, or of later complication, without mention of misadventure at the time of the procedure: Secondary | ICD-10-CM | POA: Diagnosis not present

## 2015-02-01 DIAGNOSIS — D62 Acute posthemorrhagic anemia: Secondary | ICD-10-CM | POA: Diagnosis not present

## 2015-02-01 DIAGNOSIS — Z7902 Long term (current) use of antithrombotics/antiplatelets: Secondary | ICD-10-CM | POA: Diagnosis not present

## 2015-02-01 DIAGNOSIS — I2583 Coronary atherosclerosis due to lipid rich plaque: Secondary | ICD-10-CM | POA: Diagnosis present

## 2015-02-01 DIAGNOSIS — I509 Heart failure, unspecified: Secondary | ICD-10-CM

## 2015-02-01 DIAGNOSIS — Z951 Presence of aortocoronary bypass graft: Secondary | ICD-10-CM

## 2015-02-01 DIAGNOSIS — I314 Cardiac tamponade: Secondary | ICD-10-CM

## 2015-02-01 DIAGNOSIS — I214 Non-ST elevation (NSTEMI) myocardial infarction: Secondary | ICD-10-CM | POA: Diagnosis not present

## 2015-02-01 DIAGNOSIS — Z9861 Coronary angioplasty status: Secondary | ICD-10-CM

## 2015-02-01 DIAGNOSIS — Z7982 Long term (current) use of aspirin: Secondary | ICD-10-CM

## 2015-02-01 DIAGNOSIS — H04123 Dry eye syndrome of bilateral lacrimal glands: Secondary | ICD-10-CM | POA: Diagnosis present

## 2015-02-01 DIAGNOSIS — Z9114 Patient's other noncompliance with medication regimen: Secondary | ICD-10-CM | POA: Diagnosis not present

## 2015-02-01 DIAGNOSIS — E663 Overweight: Secondary | ICD-10-CM | POA: Diagnosis present

## 2015-02-01 DIAGNOSIS — Z7984 Long term (current) use of oral hypoglycemic drugs: Secondary | ICD-10-CM | POA: Diagnosis not present

## 2015-02-01 DIAGNOSIS — I110 Hypertensive heart disease with heart failure: Secondary | ICD-10-CM | POA: Diagnosis present

## 2015-02-01 DIAGNOSIS — I2510 Atherosclerotic heart disease of native coronary artery without angina pectoris: Secondary | ICD-10-CM

## 2015-02-01 DIAGNOSIS — Z720 Tobacco use: Secondary | ICD-10-CM

## 2015-02-01 DIAGNOSIS — Z79899 Other long term (current) drug therapy: Secondary | ICD-10-CM | POA: Diagnosis not present

## 2015-02-01 DIAGNOSIS — E876 Hypokalemia: Secondary | ICD-10-CM | POA: Diagnosis not present

## 2015-02-01 DIAGNOSIS — I472 Ventricular tachycardia: Secondary | ICD-10-CM | POA: Diagnosis not present

## 2015-02-01 DIAGNOSIS — E118 Type 2 diabetes mellitus with unspecified complications: Secondary | ICD-10-CM | POA: Diagnosis present

## 2015-02-01 DIAGNOSIS — I779 Disorder of arteries and arterioles, unspecified: Secondary | ICD-10-CM | POA: Diagnosis present

## 2015-02-01 DIAGNOSIS — I97630 Postprocedural hematoma of a circulatory system organ or structure following a cardiac catheterization: Secondary | ICD-10-CM | POA: Diagnosis not present

## 2015-02-01 DIAGNOSIS — R0602 Shortness of breath: Secondary | ICD-10-CM

## 2015-02-01 DIAGNOSIS — J9811 Atelectasis: Secondary | ICD-10-CM

## 2015-02-01 DIAGNOSIS — I25110 Atherosclerotic heart disease of native coronary artery with unstable angina pectoris: Secondary | ICD-10-CM | POA: Diagnosis present

## 2015-02-01 DIAGNOSIS — I10 Essential (primary) hypertension: Secondary | ICD-10-CM | POA: Diagnosis present

## 2015-02-01 DIAGNOSIS — Z9119 Patient's noncompliance with other medical treatment and regimen: Secondary | ICD-10-CM

## 2015-02-01 DIAGNOSIS — Y92238 Other place in hospital as the place of occurrence of the external cause: Secondary | ICD-10-CM

## 2015-02-01 DIAGNOSIS — I200 Unstable angina: Secondary | ICD-10-CM

## 2015-02-01 DIAGNOSIS — R079 Chest pain, unspecified: Secondary | ICD-10-CM | POA: Diagnosis present

## 2015-02-01 DIAGNOSIS — S301XXA Contusion of abdominal wall, initial encounter: Secondary | ICD-10-CM

## 2015-02-01 DIAGNOSIS — I739 Peripheral vascular disease, unspecified: Secondary | ICD-10-CM

## 2015-02-01 HISTORY — DX: Disorder of arteries and arterioles, unspecified: I77.9

## 2015-02-01 HISTORY — PX: CARDIAC CATHETERIZATION: SHX172

## 2015-02-01 HISTORY — DX: Peripheral vascular disease, unspecified: I73.9

## 2015-02-01 LAB — BASIC METABOLIC PANEL WITH GFR
Anion gap: 8 (ref 5–15)
BUN: 11 mg/dL (ref 6–20)
CO2: 24 mmol/L (ref 22–32)
Calcium: 9.3 mg/dL (ref 8.9–10.3)
Chloride: 103 mmol/L (ref 101–111)
Creatinine, Ser: 0.8 mg/dL (ref 0.44–1.00)
GFR calc Af Amer: >60 mL/min
GFR calc non Af Amer: >60 mL/min
Glucose, Bld: 329 mg/dL — ABNORMAL HIGH (ref 65–99)
Potassium: 4.2 mmol/L (ref 3.5–5.1)
Sodium: 135 mmol/L (ref 135–145)

## 2015-02-01 LAB — CBC
HCT: 41.3 % (ref 36.0–46.0)
Hemoglobin: 14.3 g/dL (ref 12.0–15.0)
MCH: 30 pg (ref 26.0–34.0)
MCHC: 34.6 g/dL (ref 30.0–36.0)
MCV: 86.6 fL (ref 78.0–100.0)
Platelets: 178 K/uL (ref 150–400)
RBC: 4.77 MIL/uL (ref 3.87–5.11)
RDW: 13.7 % (ref 11.5–15.5)
WBC: 10 K/uL (ref 4.0–10.5)

## 2015-02-01 LAB — MRSA PCR SCREENING: MRSA BY PCR: NEGATIVE

## 2015-02-01 LAB — GLUCOSE, CAPILLARY: GLUCOSE-CAPILLARY: 326 mg/dL — AB (ref 65–99)

## 2015-02-01 LAB — I-STAT TROPONIN, ED: Troponin i, poc: 0 ng/mL (ref 0.00–0.08)

## 2015-02-01 SURGERY — LEFT HEART CATH AND CORONARY ANGIOGRAPHY
Anesthesia: LOCAL

## 2015-02-01 MED ORDER — SODIUM CHLORIDE 0.9 % IV SOLN
250.0000 mL | INTRAVENOUS | Status: DC | PRN
  Administered 2015-02-06: 250 mL via INTRAVENOUS

## 2015-02-01 MED ORDER — VERAPAMIL HCL 2.5 MG/ML IV SOLN
INTRAVENOUS | Status: AC
  Filled 2015-02-01: qty 2

## 2015-02-01 MED ORDER — ASPIRIN 81 MG PO CHEW: 81.0000 mg | CHEWABLE_TABLET | ORAL | Status: DC

## 2015-02-01 MED ORDER — NITROGLYCERIN IN D5W 200-5 MCG/ML-% IV SOLN
INTRAVENOUS | Status: DC | PRN
  Administered 2015-02-01: 10 ug/min via INTRAVENOUS

## 2015-02-01 MED ORDER — ASPIRIN 81 MG PO CHEW
243.0000 mg | CHEWABLE_TABLET | Freq: Once | ORAL | Status: AC
  Administered 2015-02-01: 243 mg via ORAL
  Filled 2015-02-01: qty 3

## 2015-02-01 MED ORDER — SODIUM CHLORIDE 0.9 % IJ SOLN: 3.0000 mL | INTRAMUSCULAR | Status: DC | PRN

## 2015-02-01 MED ORDER — HYDRALAZINE HCL 20 MG/ML IJ SOLN
10.0000 mg | INTRAMUSCULAR | Status: DC | PRN
  Administered 2015-02-01: 10 mg via INTRAVENOUS
  Filled 2015-02-01: qty 1

## 2015-02-01 MED ORDER — NITROGLYCERIN IN D5W 200-5 MCG/ML-% IV SOLN
0.0000 ug/min | INTRAVENOUS | Status: DC
  Administered 2015-02-01: 40 ug/min via INTRAVENOUS

## 2015-02-01 MED ORDER — MORPHINE SULFATE (PF) 2 MG/ML IV SOLN
2.0000 mg | INTRAVENOUS | Status: DC | PRN
  Administered 2015-02-01 – 2015-02-02 (×2): 2 mg via INTRAVENOUS
  Filled 2015-02-01 (×2): qty 1

## 2015-02-01 MED ORDER — ONDANSETRON HCL 4 MG/2ML IJ SOLN
4.0000 mg | Freq: Four times a day (QID) | INTRAMUSCULAR | Status: DC | PRN
  Filled 2015-02-01: qty 2

## 2015-02-01 MED ORDER — SODIUM CHLORIDE 0.9 % IJ SOLN
3.0000 mL | Freq: Two times a day (BID) | INTRAMUSCULAR | Status: DC
  Administered 2015-02-01 – 2015-02-06 (×6): 3 mL via INTRAVENOUS

## 2015-02-01 MED ORDER — HEPARIN SODIUM (PORCINE) 1000 UNIT/ML IJ SOLN
INTRAMUSCULAR | Status: AC
  Filled 2015-02-01: qty 1

## 2015-02-01 MED ORDER — ASPIRIN EC 81 MG PO TBEC: 81.0000 mg | DELAYED_RELEASE_TABLET | Freq: Every day | ORAL | Status: DC

## 2015-02-01 MED ORDER — ATORVASTATIN CALCIUM 80 MG PO TABS: 80.0000 mg | ORAL_TABLET | Freq: Every day | ORAL | Status: DC

## 2015-02-01 MED ORDER — NITROGLYCERIN 1 MG/10 ML FOR IR/CATH LAB
INTRA_ARTERIAL | Status: AC
  Filled 2015-02-01: qty 10

## 2015-02-01 MED ORDER — LISINOPRIL 10 MG PO TABS: 10.0000 mg | ORAL_TABLET | Freq: Every day | ORAL | Status: DC

## 2015-02-01 MED ORDER — INSULIN GLARGINE 100 UNIT/ML ~~LOC~~ SOLN: 20.0000 [IU] | Freq: Every day | SUBCUTANEOUS | Status: DC

## 2015-02-01 MED ORDER — SODIUM CHLORIDE 0.9 % IV SOLN: 250.0000 mL | INTRAVENOUS | Status: DC | PRN

## 2015-02-01 MED ORDER — HEPARIN (PORCINE) IN NACL 100-0.45 UNIT/ML-% IJ SOLN
1000.0000 [IU]/h | INTRAMUSCULAR | Status: DC
  Administered 2015-02-01: 800 [IU]/h via INTRAVENOUS
  Administered 2015-02-04 – 2015-02-06 (×3): 1000 [IU]/h via INTRAVENOUS
  Filled 2015-02-01 (×5): qty 250

## 2015-02-01 MED ORDER — HEPARIN (PORCINE) IN NACL 2-0.9 UNIT/ML-% IJ SOLN
INTRAMUSCULAR | Status: AC
  Filled 2015-02-01: qty 1000

## 2015-02-01 MED ORDER — ASPIRIN 81 MG PO CHEW
81.0000 mg | CHEWABLE_TABLET | Freq: Every day | ORAL | Status: DC
  Administered 2015-02-02 – 2015-02-06 (×5): 81 mg via ORAL
  Filled 2015-02-01 (×5): qty 1

## 2015-02-01 MED ORDER — METOPROLOL TARTRATE 12.5 MG HALF TABLET: 25.0000 mg | ORAL_TABLET | Freq: Two times a day (BID) | ORAL | Status: DC

## 2015-02-01 MED ORDER — SODIUM CHLORIDE 0.9 % IJ SOLN: 3.0000 mL | Freq: Two times a day (BID) | INTRAMUSCULAR | Status: DC

## 2015-02-01 MED ORDER — HYDRALAZINE HCL 20 MG/ML IJ SOLN
INTRAMUSCULAR | Status: DC | PRN
  Administered 2015-02-01: 10 mg via INTRAVENOUS

## 2015-02-01 MED ORDER — SODIUM CHLORIDE 0.9 % IV SOLN
INTRAVENOUS | Status: DC
  Administered 2015-02-01: 16:00:00 via INTRAVENOUS

## 2015-02-01 MED ORDER — METFORMIN HCL 500 MG PO TABS: 500.0000 mg | ORAL_TABLET | Freq: Two times a day (BID) | ORAL | Status: DC

## 2015-02-01 MED ORDER — NITROGLYCERIN 1 MG/10 ML FOR IR/CATH LAB
INTRA_ARTERIAL | Status: DC | PRN
  Administered 2015-02-01: 17:00:00

## 2015-02-01 MED ORDER — SODIUM CHLORIDE 0.9 % WEIGHT BASED INFUSION: 1.0000 mL/kg/h | INTRAVENOUS | Status: AC

## 2015-02-01 MED ORDER — ACETAMINOPHEN 325 MG PO TABS
650.0000 mg | ORAL_TABLET | ORAL | Status: DC | PRN
  Administered 2015-02-02 – 2015-02-03 (×3): 650 mg via ORAL
  Filled 2015-02-01 (×3): qty 2

## 2015-02-01 MED ORDER — NITROGLYCERIN 0.4 MG SL SUBL: 0.4000 mg | SUBLINGUAL_TABLET | SUBLINGUAL | Status: DC | PRN

## 2015-02-01 MED ORDER — TICAGRELOR 90 MG PO TABS: 90.0000 mg | ORAL_TABLET | Freq: Two times a day (BID) | ORAL | Status: DC

## 2015-02-01 MED ORDER — FISH OIL 1000 MG PO CAPS: 1000.0000 mg | ORAL_CAPSULE | Freq: Every day | ORAL | Status: DC

## 2015-02-01 MED ORDER — LIDOCAINE HCL (PF) 1 % IJ SOLN
INTRAMUSCULAR | Status: AC
  Filled 2015-02-01: qty 30

## 2015-02-01 MED ORDER — HYDRALAZINE HCL 20 MG/ML IJ SOLN
INTRAMUSCULAR | Status: AC
  Filled 2015-02-01: qty 1

## 2015-02-01 MED ORDER — LIDOCAINE HCL (PF) 1 % IJ SOLN
INTRAMUSCULAR | Status: DC | PRN
  Administered 2015-02-01: 15 mL
  Administered 2015-02-01: 2 mL

## 2015-02-01 MED ORDER — IOHEXOL 350 MG/ML SOLN
INTRAVENOUS | Status: DC | PRN
  Administered 2015-02-01: 100 mL via INTRAVENOUS

## 2015-02-01 SURGICAL SUPPLY — 12 items
CATH INFINITI 5FR MULTPACK ANG (CATHETERS) ×1 IMPLANT
DEVICE RAD COMP TR BAND LRG (VASCULAR PRODUCTS) ×2 IMPLANT
GLIDESHEATH SLEND A-KIT 6F 22G (SHEATH) ×2 IMPLANT
KIT HEART LEFT (KITS) ×2 IMPLANT
PACK CARDIAC CATHETERIZATION (CUSTOM PROCEDURE TRAY) ×2 IMPLANT
SHEATH PINNACLE 5F 10CM (SHEATH) ×1 IMPLANT
SYR MEDRAD MARK V 150ML (SYRINGE) ×2 IMPLANT
TRANSDUCER W/STOPCOCK (MISCELLANEOUS) ×2 IMPLANT
TUBING CIL FLEX 10 FLL-RA (TUBING) ×2 IMPLANT
WIRE EMERALD 3MM-J .035X150CM (WIRE) ×1 IMPLANT
WIRE HI TORQ VERSACORE-J 145CM (WIRE) ×2 IMPLANT
WIRE SAFE-T 1.5MM-J .035X260CM (WIRE) ×2 IMPLANT

## 2015-02-01 NOTE — ED Provider Notes (Signed)
CSN: [id number]     Arrival date & time 02/01/15  0831 History   First MD Initiated Contact with Patient 02/01/15 [phone]     Chief Complaint  Patient presents with  . Chest Pain     (Consider location/radiation/quality/duration/timing/severity/associated sxs/prior Treatment) HPI Comments: [name] is a 44 y.o F with a pmhx of 2 MIs, multiple stent placements most recently April 2016 who presents the emergency room today complaining of chest pain. Patient was seen in the ED yesterday, with cardiology consult for unstable angina who recommended that she have a heart catheterization. Patient left AMA stating that she did not want to wait. Patient states she doctor cardiologist who recommended that she come back in today for further evaluation and heart catheterization. Patient states she is having substernal chest pain intermittently for the last 2 weeks. Pain radiates to her back. Pain is worsened with exertion although she has felt at rest at times. She took some nitroglycerin which provides relief. Patient took 81 mg aspirin this morning. Pain is 8 out of 10 when it occurs however patient is pain-free at this moment. Reports baseline shortness of breath, no change from baseline. Denies nausea, vomiting, abdominal pain, numbness, weakness, syncope, tingling, headache, blurry vision.  Patient is a 44 y.o. female presenting with chest pain. The history is provided by the patient.  Chest Pain   Past Medical History  Diagnosis Date  . Hypertension   . Diabetes mellitus without complication   . MI (myocardial infarction)   . CAD (coronary artery disease)     cath 09/07/2014 DES to 95% prox RCA, residual 70-80% origin of AV groove, 90% ostial PDA stenosis  . Hyperlipidemia   . Tobacco abuse   . H/O medication noncompliance    Past Surgical History  Procedure Laterality Date  . Left heart catheterization with coronary angiogram N/A 05/29/2014    Procedure: LEFT HEART CATHETERIZATION WITH  CORONARY ANGIOGRAM;  Surgeon: Corky Crafts, MD;  Location: [facility] CATH LAB;  Service: Cardiovascular;  Laterality: N/A;  . Percutaneous coronary stent intervention (pci-s)  05/29/2014    Procedure: PERCUTANEOUS CORONARY STENT INTERVENTION (PCI-S);  Surgeon: Corky Crafts, MD;  Location: [facility] CATH LAB;  Service: Cardiovascular;;  . Abdominal hysterectomy    . Left heart catheterization with coronary angiogram N/A 09/07/2014    Procedure: LEFT HEART CATHETERIZATION WITH CORONARY ANGIOGRAM;  Surgeon: Marykay Lex, MD;  Location: [facility] CATH LAB;  Service: Cardiovascular;  Laterality: N/A;   Family History  Problem Relation Age of Onset  . Hypertension Mother   . Kidney failure Mother   . Heart attack Father   . Heart disease Father    Social History  Substance Use Topics  . Smoking status: Former Smoker -- 0.00 packs/day    Types: Cigarettes  . Smokeless tobacco: Never Used  . Alcohol Use: No   OB History    No data available     Review of Systems  Cardiovascular: Positive for chest pain.  All other systems reviewed and are negative.     Allergies  Review of patient's allergies indicates no known allergies.  Home Medications   Prior to Admission medications   Medication Sig Start Date End Date Taking? Authorizing Provider  aspirin 81 MG tablet Take 81 mg by mouth daily.    Historical Provider, MD  aspirin EC 81 MG tablet Take 81 mg by mouth daily.    Historical Provider, MD  atorvastatin (LIPITOR) 80 MG tablet Take 1 tablet (80 mg total)  by mouth daily at 6 PM. Patient taking differently: Take 80 mg by mouth daily.  09/08/14   Azalee Course, PA  insulin glargine (LANTUS) 100 UNIT/ML injection Inject 20 Units into the skin at bedtime.     Historical Provider, MD  lisinopril (PRINIVIL,ZESTRIL) 10 MG tablet Take 1 tablet (10 mg total) by mouth daily. 03/11/14   Ria Clock, PA  metFORMIN (GLUCOPHAGE) 500 MG tablet Take 1 tablet (500 mg total) by mouth 2 (two) times  daily with a meal. 06/02/14   Brittainy Sherlynn Carbon, PA-C  metoprolol tartrate (LOPRESSOR) 25 MG tablet Take 1 tablet (25 mg total) by mouth 2 (two) times daily. 12/13/14   Runell Gess, MD  nitroGLYCERIN (NITROSTAT) 0.4 MG SL tablet Place 1 tablet (0.4 mg total) under the tongue every 5 (five) minutes as needed for chest pain. 07/31/14   Leone Brand, NP  Omega-3 Fatty Acids (FISH OIL) 1000 MG CAPS Take 1,000 mg by mouth at bedtime.    Historical Provider, MD  ticagrelor (BRILINTA) 90 MG TABS tablet Take 1 tablet (90 mg total) by mouth 2 (two) times daily. 12/13/14   Runell Gess, MD  trolamine salicylate (ASPERCREME) 10 % cream Apply 1 application topically as needed for muscle pain (Joint pain in left thumb).    Historical Provider, MD   BP 149/87 mmHg  Pulse 73  Temp(Src) 98.6 F (37 C) (Oral)  Resp 13  SpO2 98% Physical Exam  Constitutional: She is oriented to person, place, and time. She appears well-developed and well-nourished. No distress.  HENT:  Head: Normocephalic and atraumatic.  Mouth/Throat: Oropharynx is clear and moist. No oropharyngeal exudate.  Eyes: Conjunctivae and EOM are normal. Pupils are equal, round, and reactive to light. Right eye exhibits no discharge. Left eye exhibits no discharge. No scleral icterus.  Neck: Normal range of motion. Neck supple. No tracheal deviation present.  Cardiovascular: Normal rate, regular rhythm, normal heart sounds and intact distal pulses.  Exam reveals no gallop and no friction rub.   No murmur heard. Pulmonary/Chest: Effort normal and breath sounds normal. No respiratory distress. She has no wheezes. She has no rales. She exhibits no tenderness.  Abdominal: Soft. Bowel sounds are normal. She exhibits no distension and no mass. There is no tenderness. There is no rebound and no guarding.  Musculoskeletal: Normal range of motion. She exhibits no edema or tenderness.  Lymphadenopathy:    She has no cervical adenopathy.   Neurological: She is alert and oriented to person, place, and time. No cranial nerve deficit.  Strength 5/5 throughout. No sensory deficits.  No gait abnormality.  Skin: Skin is warm and dry. No rash noted. She is not diaphoretic. No erythema. No pallor.  Psychiatric: She has a normal mood and affect. Her behavior is normal.  Nursing note and vitals reviewed.   ED Course  Procedures (including critical care time) Labs Review Labs Reviewed  BASIC METABOLIC PANEL - Abnormal; Notable for the following:    Glucose, Bld 329 (*)    All other components within normal limits  CBC  I-STAT TROPOININ, ED    Imaging Review Dg Chest 2 View  01/31/2015   CLINICAL DATA:  44 year old female with acute chest pain for several weeks.  EXAM: CHEST  2 VIEW  COMPARISON:  09/06/2014 and prior radiographs  FINDINGS: The cardiomediastinal silhouette is unremarkable.  There is no evidence of focal airspace disease, pulmonary edema, suspicious pulmonary nodule/mass, pleural effusion, or pneumothorax. No acute bony abnormalities  are identified.  IMPRESSION: No active cardiopulmonary disease.   Electronically Signed   By: Harmon Pier M.D.   On: 01/31/2015 09:18   I have personally reviewed and evaluated these images and lab results as part of my medical decision-making.   EKG Interpretation   Date/Time:  Friday February 01 2015 08:37:42 EDT Ventricular Rate:  85 PR Interval:  137 QRS Duration: 102 QT Interval:  386 QTC Calculation: 459 R Axis:   -32 Text Interpretation:  Sinus rhythm Left axis deviation Low voltage,  precordial leads Nonspecific repol abnormality, lateral leads No  significant change since last tracing Confirmed by ALLEN  MD, ANTHONY  ([id number]) on 02/01/2015 12:34:51 PM      MDM   Final diagnoses:  Unstable angina   Patient seen in ED yesterday for chest pain with recommended heart catheterization. However, patient left AMA. Patient returns ED today for heart cath.  9:36  AM Spoke with cardiology who will consult pt in ED. THey plan to cath pt today, but state that it will not occur until later this afternoon.  Patient given 324 aspirin. Vital signs stable. Discussed treatment plan with patient who is agreeable.  Patient was discussed with and seen by Dr. Freida Busman who agrees with the treatment plan.      Lester Kinsman [place], PA-C 02/01/15 1551  Lorre Nick, MD 02/02/15 [phone]

## 2015-02-01 NOTE — ED Notes (Signed)
Pt seen at [facility] yesterday for CP, told she needed a stent, but left against medical advice because "did not want to stay in the ED any longer." Pt returned after her cardiologist advised her to do so. Pt hx 3 stents. Denies CP, shortness of breath, n/v, diaphoresis, or other related symptoms at this time.

## 2015-02-01 NOTE — Progress Notes (Signed)
ANTICOAGULATION CONSULT NOTE - Initial Consult  Pharmacy Consult:  Heparin Indication:  ACS/STEMI s/p cath  No Known Allergies  Patient Measurements: Weight: 168 lb 14.4 oz (76.613 kg) Heparin Dosing Weight: 68 kg  Vital Signs: Temp: 98.6 F (37 C) (09/23 0852) Temp Source: Oral (09/23 0852) BP: 142/79 mmHg (09/23 1500) Pulse Rate: 69 (09/23 1500)  Labs:  Recent Labs  01/31/15 0900 02/01/15 0904  HGB 13.9 14.3  HCT 41.4 41.3  PLT 195 178  CREATININE 0.87 0.80    Estimated Creatinine Clearance: 88.9 mL/min (by C-G formula based on Cr of 0.8).   Medical History: Past Medical History  Diagnosis Date  . Hypertension   . Diabetes mellitus without complication   . MI (myocardial infarction)   . CAD (coronary artery disease)     cath 09/07/2014 DES to 95% prox RCA, residual 70-80% origin of AV groove, 90% ostial PDA stenosis  . Hyperlipidemia   . Tobacco abuse   . H/O medication noncompliance       Assessment: 28 YOF with history of DM, HTN, CAD post DES to RCA, HLD, and medication noncompliance presented with chest pain.  Now s/p cath and IV heparin to start 6 hr post sheath removal.  Per RN, sheath was removed at 1700.  There is a large hematoma present at removal but resolved after manual compression applied per documentation.   Goal of Therapy:  Heparin level 0.3-0.7 units/ml Monitor platelets by anticoagulation protocol: Yes    Plan:  - At 2300 today, start heparin gtt at 800 units/hr, no bolus - Check 6 hr HL - Daily HL / CBC - Monitor for s/sx of bleeding/hematoma   Thuy D. Laney Potash, PharmD, BCPS Pager:  [phone] 02/01/2015, 6:26 PM

## 2015-02-01 NOTE — Interval H&P Note (Signed)
Cath Lab Visit (complete for each Cath Lab visit)  Clinical Evaluation Leading to the Procedure:   ACS: Yes.    Non-ACS:    Anginal Classification: CCS III  Anti-ischemic medical therapy: Maximal Therapy (2 or more classes of medications)  Non-Invasive Test Results: No non-invasive testing performed  Prior CABG: No previous CABG      History and Physical Interval Note:  02/01/2015 4:17 PM  [name]  has presented today for surgery, with the diagnosis of cp  The various methods of treatment have been discussed with the patient and family. After consideration of risks, benefits and other options for treatment, the patient has consented to  Procedure(s): Left Heart Cath and Coronary Angiography (N/A) as a surgical intervention .  The patient's history has been reviewed, patient examined, no change in status, stable for surgery.  I have reviewed the patient's chart and labs.  Questions were answered to the patient's satisfaction.     [name]

## 2015-02-01 NOTE — Progress Notes (Signed)
Reoval of right arterial sheath. Large hematoma present at removal and manual compression was applied to site by Lance Bosch for 30 minutes. Groin is stable with no oozing, bruising or hematoma present. Vital, BP: 127/68, HR106, sats 98%. Sterile 4x4 applied to site with sterile tegaderm to secure. Dressing dry and intact upon departure from cath lab.

## 2015-02-01 NOTE — ED Notes (Signed)
Spoke with cathlab, bringing patient to pre-op area.

## 2015-02-01 NOTE — H&P (Signed)
Patient ID: FRUMA AFRICA MRN: [id number], DOB/AGE: July 02, 1970   Admit date: 02/01/2015  Primary Physician: Dorrene German, MD Primary Cardiologist: Dr. Allyson Sabal  Pt. Profile:  Chest pain  Problem List  Past Medical History  Diagnosis Date  . Hypertension   . Diabetes mellitus without complication   . MI (myocardial infarction)   . CAD (coronary artery disease)     cath 09/07/2014 DES to 95% prox RCA, residual 70-80% origin of AV groove, 90% ostial PDA stenosis  . Hyperlipidemia   . Tobacco abuse   . H/O medication noncompliance     Past Surgical History  Procedure Laterality Date  . Left heart catheterization with coronary angiogram N/A 05/29/2014    Procedure: LEFT HEART CATHETERIZATION WITH CORONARY ANGIOGRAM;  Surgeon: Corky Crafts, MD;  Location: [facility] CATH LAB;  Service: Cardiovascular;  Laterality: N/A;  . Percutaneous coronary stent intervention (pci-s)  05/29/2014    Procedure: PERCUTANEOUS CORONARY STENT INTERVENTION (PCI-S);  Surgeon: Corky Crafts, MD;  Location: [facility] CATH LAB;  Service: Cardiovascular;;  . Abdominal hysterectomy    . Left heart catheterization with coronary angiogram N/A 09/07/2014    Procedure: LEFT HEART CATHETERIZATION WITH CORONARY ANGIOGRAM;  Surgeon: Marykay Lex, MD;  Location: [facility] CATH LAB;  Service: Cardiovascular;  Laterality: N/A;    Allergies  No Known Allergies  HPI  Patient is a 44 y.o. female with a PMHx of CAD, s/p left cardiac cath in 08/2014 with DES to 95% prox RCA, residual 70-80% origin of AV groove, 90% ostial PDA stenosis), HTN, DM2, hyperlipidemia, cigarette smoking , who was admitted to [facility] on 01/31/2015 for evaluation of chest pain. She had 2 weeks of progressive angina. Worse with exertion. Radiates out her left arm . Relieved with SL NTG. Pressure, burning like pain, radiates through to her back. Would last a minute or so , the pain eases up if she stops walking, + associated witih increased dyspnea, Quit  smoking 1 1/2 weeks ago. She was scheduled for a cath yesterday but left AMA from the ER to go to [facility]. She comes back to ER today with complain of the same chest pain.    Home Medications  Prior to Admission medications   Medication Sig Start Date End Date Taking? Authorizing Provider  aspirin EC 81 MG tablet Take 81 mg by mouth daily.   Yes Historical Provider, MD  atorvastatin (LIPITOR) 80 MG tablet Take 1 tablet (80 mg total) by mouth daily at 6 PM. Patient taking differently: Take 80 mg by mouth daily.  09/08/14  Yes Azalee Course, PA  insulin glargine (LANTUS) 100 UNIT/ML injection Inject 20 Units into the skin at bedtime.    Yes Historical Provider, MD  lisinopril (PRINIVIL,ZESTRIL) 10 MG tablet Take 1 tablet (10 mg total) by mouth daily. 03/11/14  Yes Ria Clock, PA  metFORMIN (GLUCOPHAGE) 500 MG tablet Take 1 tablet (500 mg total) by mouth 2 (two) times daily with a meal. 06/02/14  Yes Brittainy Sherlynn Carbon, PA-C  metoprolol tartrate (LOPRESSOR) 25 MG tablet Take 1 tablet (25 mg total) by mouth 2 (two) times daily. 12/13/14  Yes Runell Gess, MD  nitroGLYCERIN (NITROSTAT) 0.4 MG SL tablet Place 1 tablet (0.4 mg total) under the tongue every 5 (five) minutes as needed for chest pain. 07/31/14  Yes Leone Brand, NP  Omega-3 Fatty Acids (FISH OIL) 1000 MG CAPS Take 1,000 mg by mouth at bedtime.   Yes Historical Provider, MD  ticagrelor Marden Noble)  90 MG TABS tablet Take 1 tablet (90 mg total) by mouth 2 (two) times daily. 12/13/14  Yes Runell Gess, MD  trolamine salicylate (ASPERCREME) 10 % cream Apply 1 application topically as needed for muscle pain (Joint pain in left thumb).   Yes Historical Provider, MD    Family History  Family History  Problem Relation Age of Onset  . Hypertension Mother   . Kidney failure Mother   . Heart attack Father   . Heart disease Father     Social History  Social History   Social History  . Marital Status: Single    Spouse Name:  N/A  . Number of Children: 2  . Years of Education: N/A   Occupational History  . Not on file.   Social History Main Topics  . Smoking status: Former Smoker -- 0.00 packs/day    Types: Cigarettes  . Smokeless tobacco: Never Used  . Alcohol Use: No  . Drug Use: No  . Sexual Activity: Not on file   Other Topics Concern  . Not on file   Social History Narrative     Review of Systems General:  No chills, fever, night sweats or weight changes.  Cardiovascular:  No chest pain, dyspnea on exertion, edema, orthopnea, palpitations, paroxysmal nocturnal dyspnea. Dermatological: No rash, lesions/masses Respiratory: No cough, dyspnea Urologic: No hematuria, dysuria Abdominal:   No nausea, vomiting, diarrhea, bright red blood per rectum, melena, or hematemesis Neurologic:  No visual changes, wkns, changes in mental status. All other systems reviewed and are otherwise negative except as noted above.  Physical Exam  Blood pressure 149/87, pulse 73, temperature 98.6 F (37 C), temperature source Oral, resp. rate 13, SpO2 98 %.  General: Pleasant, NAD Psych: Normal affect. Neuro: Alert and oriented X 3. Moves all extremities spontaneously. HEENT: Normal  Neck: Supple without bruits or JVD. Lungs:  Resp regular and unlabored, CTA. Heart: RRR no s3, s4, or murmurs. Abdomen: Soft, non-tender, non-distended, BS + x 4.  Extremities: No clubbing, cyanosis or edema. DP/PT/Radials 2+ and equal bilaterally.  Labs  No results for input(s): CKTOTAL, CKMB, TROPONINI in the last 72 hours. Lab Results  Component Value Date   WBC 10.0 02/01/2015   HGB 14.3 02/01/2015   HCT 41.3 02/01/2015   MCV 86.6 02/01/2015   PLT 178 02/01/2015    Recent Labs Lab 02/01/15 0904  NA 135  K 4.2  CL 103  CO2 24  BUN 11  CREATININE 0.80  CALCIUM 9.3  GLUCOSE 329*   Lab Results  Component Value Date   CHOL 111 01/31/2015   HDL 38* 01/31/2015   LDLCALC 50 01/31/2015   TRIG 114 01/31/2015    Radiology/Studies  Dg Chest 2 View  01/31/2015   CLINICAL DATA:  44 year old female with acute chest pain for several weeks.  EXAM: CHEST  2 VIEW  COMPARISON:  09/06/2014 and prior radiographs  FINDINGS: The cardiomediastinal silhouette is unremarkable.  There is no evidence of focal airspace disease, pulmonary edema, suspicious pulmonary nodule/mass, pleural effusion, or pneumothorax. No acute bony abnormalities are identified.  IMPRESSION: No active cardiopulmonary disease.   Electronically Signed   By: Harmon Pier M.D.   On: 01/31/2015 09:18   Echocardiogram - 05/31/2014 Left ventricle: The cavity size was normal. Wall thickness was normal. Systolic function was normal. The estimated ejection fraction was in the range of 55% to 60%. ------------------------------------------------------------------- Aortic valve:  Mildly thickened leaflets. Doppler: There was no regurgitation. ------------------------------------------------------------------- Mitral valve:  Mildly  thickened leaflets . Doppler: There was trivial regurgitation.  Peak gradient (D): 4 mm Hg. ------------------------------------------------------------------- Left atrium: The atrium was normal in size. ------------------------------------------------------------------ Right ventricle: The cavity size was normal. Wall thickness was normal. Systolic function was normal. ------------------------------------------------------------------- Pulmonic valve:  Structurally normal valve.  Cusp separation was normal. Doppler: Transvalvular velocity was within the normal range. There was no regurgitation.  ECG: SR, non-specific changes in the lateral leads    ASSESSMENT AND PLAN  1. Unstable angina : Pt presents with symptoms c/w her unstable angina . CP with exertion, relieved with rest and with SL NTG. She has mild NS ST abnormalities in her lateral leads.  She just quit smoking 1-2 weeks ago. Troponin  negative x 2.  ECG previousl Continue ASA, Brilinta, lisinopril, metoprolol and atorvastatin.  Ive discussed risks / benefits / options of cardiac cath. She understands and agrees to proceed.  Will anticipate cath later today - after 3 pm.  2. Hyperlipidemia:  - at goal, - continue atorvastatin 80 mg po daily  3. Hypertension  - increase lisinopril to 20 mg po daily  DVT PPX - low risk, going for cath in several hours. On Brilinta    Signed, Jaquelynn Wanamaker H, MD, [facility] 02/01/2015, 10:21 AM

## 2015-02-01 NOTE — ED Notes (Signed)
Denies cp, sob.

## 2015-02-02 ENCOUNTER — Inpatient Hospital Stay (HOSPITAL_COMMUNITY): Payer: Medicaid Other

## 2015-02-02 DIAGNOSIS — I25110 Atherosclerotic heart disease of native coronary artery with unstable angina pectoris: Secondary | ICD-10-CM

## 2015-02-02 DIAGNOSIS — Z01810 Encounter for preprocedural cardiovascular examination: Secondary | ICD-10-CM

## 2015-02-02 LAB — BASIC METABOLIC PANEL
ANION GAP: 8 (ref 5–15)
BUN: 16 mg/dL (ref 6–20)
CALCIUM: 8.1 mg/dL — AB (ref 8.9–10.3)
CO2: 23 mmol/L (ref 22–32)
Chloride: 101 mmol/L (ref 101–111)
Creatinine, Ser: 0.84 mg/dL (ref 0.44–1.00)
GFR calc Af Amer: >60 mL/min (ref >60)
Glucose, Bld: 299 mg/dL — ABNORMAL HIGH (ref 65–99)
POTASSIUM: 3.6 mmol/L (ref 3.5–5.1)
SODIUM: 132 mmol/L — AB (ref 135–145)

## 2015-02-02 LAB — URINALYSIS, ROUTINE W REFLEX MICROSCOPIC
Bilirubin Urine: NEGATIVE
Glucose, UA: >1000 mg/dL — AB
Hgb urine dipstick: NEGATIVE
Ketones, ur: NEGATIVE mg/dL
Leukocytes, UA: NEGATIVE
Nitrite: NEGATIVE
Protein, ur: NEGATIVE mg/dL
Specific Gravity, Urine: 1.027 (ref 1.005–1.030)
Urobilinogen, UA: 0.2 mg/dL (ref 0.0–1.0)
pH: 5.5 (ref 5.0–8.0)

## 2015-02-02 LAB — HEPARIN LEVEL (UNFRACTIONATED)
HEPARIN UNFRACTIONATED: 0.63 [IU]/mL (ref 0.30–0.70)
Heparin Unfractionated: 0.17 IU/mL — ABNORMAL LOW (ref 0.30–0.70)
Heparin Unfractionated: 0.57 IU/mL (ref 0.30–0.70)

## 2015-02-02 LAB — GLUCOSE, CAPILLARY
Glucose-Capillary: 328 mg/dL — ABNORMAL HIGH (ref 65–99)
Glucose-Capillary: 206 mg/dL — ABNORMAL HIGH (ref 65–99)

## 2015-02-02 LAB — URINE MICROSCOPIC-ADD ON

## 2015-02-02 LAB — PLATELET INHIBITION P2Y12: Platelet Function  P2Y12: 240 [PRU] (ref 194–418)

## 2015-02-02 MED ORDER — ATORVASTATIN CALCIUM 80 MG PO TABS
80.0000 mg | ORAL_TABLET | Freq: Every day | ORAL | Status: DC
  Administered 2015-02-02 – 2015-02-15 (×12): 80 mg via ORAL
  Filled 2015-02-02 (×16): qty 1

## 2015-02-02 MED ORDER — BUDESONIDE-FORMOTEROL FUMARATE 160-4.5 MCG/ACT IN AERO
2.0000 | INHALATION_SPRAY | Freq: Two times a day (BID) | RESPIRATORY_TRACT | Status: DC
  Administered 2015-02-02 – 2015-02-06 (×9): 2 via RESPIRATORY_TRACT
  Filled 2015-02-02: qty 6

## 2015-02-02 MED ORDER — RAMIPRIL 2.5 MG PO CAPS
2.5000 mg | ORAL_CAPSULE | Freq: Every day | ORAL | Status: DC
  Administered 2015-02-02 – 2015-02-06 (×5): 2.5 mg via ORAL
  Filled 2015-02-02 (×6): qty 1

## 2015-02-02 MED ORDER — INSULIN ASPART 100 UNIT/ML ~~LOC~~ SOLN
0.0000 [IU] | Freq: Three times a day (TID) | SUBCUTANEOUS | Status: DC
  Administered 2015-02-02: 15 [IU] via SUBCUTANEOUS
  Administered 2015-02-02 – 2015-02-03 (×2): 11 [IU] via SUBCUTANEOUS
  Administered 2015-02-03: 7 [IU] via SUBCUTANEOUS
  Administered 2015-02-03 – 2015-02-04 (×3): 11 [IU] via SUBCUTANEOUS
  Administered 2015-02-04: 4 [IU] via SUBCUTANEOUS
  Administered 2015-02-05: 3 [IU] via SUBCUTANEOUS
  Administered 2015-02-05: 15 [IU] via SUBCUTANEOUS
  Administered 2015-02-06 (×2): 7 [IU] via SUBCUTANEOUS

## 2015-02-02 MED ORDER — INSULIN GLARGINE 100 UNIT/ML ~~LOC~~ SOLN
18.0000 [IU] | Freq: Two times a day (BID) | SUBCUTANEOUS | Status: DC
  Administered 2015-02-02 – 2015-02-03 (×3): 18 [IU] via SUBCUTANEOUS
  Filled 2015-02-02 (×4): qty 0.18

## 2015-02-02 MED ORDER — CARVEDILOL 3.125 MG PO TABS
3.1250 mg | ORAL_TABLET | Freq: Two times a day (BID) | ORAL | Status: DC
  Administered 2015-02-02 – 2015-02-05 (×7): 3.125 mg via ORAL
  Filled 2015-02-02 (×7): qty 1

## 2015-02-02 MED ORDER — INSULIN ASPART 100 UNIT/ML ~~LOC~~ SOLN
0.0000 [IU] | Freq: Every day | SUBCUTANEOUS | Status: DC
  Administered 2015-02-02 – 2015-02-03 (×2): 2 [IU] via SUBCUTANEOUS

## 2015-02-02 NOTE — Progress Notes (Signed)
ANTICOAGULATION CONSULT NOTE - Follow Up Consult  Pharmacy Consult for heparin Indication: CAD awaiting TCTS consult  Labs:  Recent Labs  01/31/15 0900 02/01/15 0904 02/02/15 0435  HGB 13.9 14.3  --   HCT 41.4 41.3  --   PLT 195 178  --   HEPARINUNFRC  --   --  0.17*  CREATININE 0.87 0.80 0.84     Assessment: 44yo female subtherapeutic on heparin with initial dosing post-cath.  Goal of Therapy:  Heparin level 0.3-0.7 units/ml   Plan:  Will increase heparin gtt by ~3 units/kg/hr to 1000 units/hr and check level in 6hr.  Vernard Gambles, PharmD, BCPS  02/02/2015,5:44 AM

## 2015-02-02 NOTE — Progress Notes (Signed)
ANTICOAGULATION CONSULT NOTE - Follow Up Consult  Pharmacy Consult for heparin Indication: CAD awaiting TCTS consult  Labs:  Recent Labs  01/31/15 0900 02/01/15 0904 02/02/15 0435 02/02/15 1230 02/02/15 2008  HGB 13.9 14.3  --   --   --   HCT 41.4 41.3  --   --   --   PLT 195 178  --   --   --   HEPARINUNFRC  --   --  0.17* 0.63 0.57  CREATININE 0.87 0.80 0.84  --   --      Assessment: 44yo female s/p cath 9/23 found to have 99% occluded Cfx not amenable to PCI. CVTS recommending CABG next week after Brilinta washout (9/23 last dose). Heparin restarted post-cath, now at goal this am, will check confirmatory level tonight. Heparin level 0.57 remains in goal.  Hematoma noted post cath, no progression noted. CBC not performed this am  [id number]  Goal of Therapy:  Heparin level 0.3-0.7 units/ml   Plan:  Continue heparin at 1000/hr Recheck HL tonight   Crystal S. Merilynn Finland, PharmD, [facility] Clinical Staff Pharmacist Pager [phone]   02/02/2015 8:39 PM

## 2015-02-02 NOTE — Progress Notes (Signed)
Patient ID: RANEEN JAFFER, female   DOB: 11-Mar-1971, 44 y.o.   MRN: [id number]    Patient Name: [name] Date of Encounter: 02/02/2015     Active Problems:   Unstable angina   Coronary artery disease due to lipid rich plaque   CAD (coronary artery disease)    SUBJECTIVE  No chest pain or sob.   CURRENT MEDS . aspirin  81 mg Oral Daily  . sodium chloride  3 mL Intravenous Q12H    OBJECTIVE  Filed Vitals:   02/02/15 0200 02/02/15 0300 02/02/15 0400 02/02/15 0824  BP: 116/61 98/54 111/58 120/60  Pulse: 87 85 86 107  Temp:   98 F (36.7 C) 99.2 F (37.3 C)  TempSrc:   Oral Oral  Resp: 18 18 0 18  Height:      Weight:      SpO2: 100% 100% 100% 100%    Intake/Output Summary (Last 24 hours) at 02/02/15 0839 Last data filed at 02/02/15 0400  Gross per 24 hour  Intake 199.07 ml  Output      0 ml  Net 199.07 ml   Filed Weights   02/01/15 1452 02/01/15 1550 02/01/15 1745  Weight: 168 lb 14.4 oz (76.613 kg) 168 lb 14.4 oz (76.613 kg) 168 lb 6.9 oz (76.4 kg)    PHYSICAL EXAM  General: Pleasant, well appearing woman, NAD. Neuro: Alert and oriented X 3. Moves all extremities spontaneously. Psych: Normal affect. HEENT:  Normal  Neck: Supple without bruits, 6 cm JVD. Lungs:  Resp regular and unlabored, CTA. Heart: RRR no s3, soft s4, or murmurs. Abdomen: Soft, non-tender, non-distended, BS + x 4.  Extremities: No clubbing, cyanosis or edema. DP/PT/Radials 2+ and equal bilaterally.  Accessory Clinical Findings  CBC  Recent Labs  01/31/15 0900 02/01/15 0904  WBC 9.9 10.0  HGB 13.9 14.3  HCT 41.4 41.3  MCV 87.2 86.6  PLT 195 178   Basic Metabolic Panel  Recent Labs  02/01/15 0904 02/02/15 0435  NA 135 132*  K 4.2 3.6  CL 103 101  CO2 24 23  GLUCOSE 329* 299*  BUN 11 16  CREATININE 0.80 0.84  CALCIUM 9.3 8.1*   Liver Function Tests No results for input(s): AST, ALT, ALKPHOS, BILITOT, PROT, ALBUMIN in the last 72 hours. No results for  input(s): LIPASE, AMYLASE in the last 72 hours. Cardiac Enzymes No results for input(s): CKTOTAL, CKMB, CKMBINDEX, TROPONINI in the last 72 hours. BNP Invalid input(s): POCBNP D-Dimer No results for input(s): DDIMER in the last 72 hours. Hemoglobin A1C No results for input(s): HGBA1C in the last 72 hours. Fasting Lipid Panel  Recent Labs  01/31/15 0900  CHOL 111  HDL 38*  LDLCALC 50  TRIG 147  CHOLHDL 2.9   Thyroid Function Tests No results for input(s): TSH, T4TOTAL, T3FREE, THYROIDAB in the last 72 hours.  Invalid input(s): FREET3  TELE  Sinus tachycardia  Radiology/Studies  Dg Chest 2 View  01/31/2015   CLINICAL DATA:  44 year old female with acute chest pain for several weeks.  EXAM: CHEST  2 VIEW  COMPARISON:  09/06/2014 and prior radiographs  FINDINGS: The cardiomediastinal silhouette is unremarkable.  There is no evidence of focal airspace disease, pulmonary edema, suspicious pulmonary nodule/mass, pleural effusion, or pneumothorax. No acute bony abnormalities are identified.  IMPRESSION: No active cardiopulmonary disease.   Electronically Signed   By: Harmon Pier M.D.   On: 01/31/2015 09:18    ASSESSMENT AND PLAN  1. 3 vessel  CAD with LV dysfunction - surgical consult is pending. PCI of tight CX difficult due to anatomic problems. Will start on beta blocker, ACE inhibitor and statin. 2. DM - will need aggressive treatment as outpatient   Gregg Taylor,M.D.  02/02/2015 8:39 AM

## 2015-02-02 NOTE — Progress Notes (Signed)
Pre-op Cardiac Surgery  Carotid Findings:  Bilateral: 40-59% ICA stenosis.  Vertebral artery flow is antegrade.   Upper Extremity Right Left  Brachial Pressures Pending   Radial Waveforms    Ulnar Waveforms    Palmar Arch (Allen's Test)     Findings:      Lower  Extremity Right Left  Dorsalis Pedis Pending   Anterior Tibial    Posterior Tibial    Ankle/Brachial Indices      Findings:

## 2015-02-02 NOTE — Progress Notes (Signed)
ANTICOAGULATION CONSULT NOTE - Follow Up Consult  Pharmacy Consult for heparin Indication: CAD awaiting TCTS consult  Labs:  Recent Labs  01/31/15 0900 02/01/15 0904 02/02/15 0435 02/02/15 1230  HGB 13.9 14.3  --   --   HCT 41.4 41.3  --   --   PLT 195 178  --   --   HEPARINUNFRC  --   --  0.17* 0.63  CREATININE 0.87 0.80 0.84  --      Assessment: 44yo female s/p cath 9/23 found to have 99% occluded Cfx not amenable to PCI. CVTS recommending CABG next week after brilinta washout (9/23 last dose). Heparin restarted post-cath, now at goal this am, will check confirmatory level tonight.  Hematoma noted post cath, no progression noted. CBC not performed this am  [id number]  Goal of Therapy:  Heparin level 0.3-0.7 units/ml   Plan:  Continue heparin at 1000/hr Recheck HL tonight   Sheppard Coil PharmD., BCPS Clinical Pharmacist Pager [phone] 02/02/2015 1:50 PM

## 2015-02-02 NOTE — Progress Notes (Signed)
Patient complained of chest pain after walking back from bathroom. Placed pt on 2l nasal cannula , obtained ekg and gave morphine . Vitals stable BP 104/62  Pulse 92 sinus rhythm on the monitor . Will continue to assess.

## 2015-02-02 NOTE — Consult Note (Signed)
CT surgery      [address]       [place] [address]             [phone]        KEIMYA BRIDDELL [facility] Medical Record #[id number] Date of Birth: 1970/09/12  Referring: No ref. provider found Primary Care: Dorrene German, MD  Chief Complaint:    Chief Complaint  Patient presents with  . Chest Pain   patient examined, most recent echocardiogram and current cardiac catheterization reviewed  History of Present Illness:     44 year old obese diabetic smoker admitted following third heart cath since January of this year for unstable angina. She has a long stent in the right coronary placed in January. She is on Brillinta twice a day. Cardiac catheterization shows stenosis of the circumflex origin and stenosis in the proximal RCA. The LAD has diffuse distal disease. Ejection fraction is 45% with echocardiogram pending. Cardiac enzymes have been negative. The patient has diffuse diabetic pattern of disease which are poor targets for grafting however the patient has not done well with PCI and high risk CABG appears to be her best long-term therapeutic option. 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   Zubrod Score: At the time of surgery this patient's most appropriate activity status/level should be described as: []     0    Normal activity, no symptoms []     1    Restricted in physical strenuous activity but ambulatory, able to do out light work []     2    Ambulatory and capable of self care, unable to do work activities, up and about                 more than 50%  Of the time                            []     3    Only limited self care, in bed greater than 50% of waking hours []     4    Completely disabled, no self care, confined to bed or chair []     5    Moribund  Past Medical History  Diagnosis Date  . Hypertension   . Diabetes mellitus without complication   . MI  (myocardial infarction)   . CAD (coronary artery disease)     cath 09/07/2014 DES to 95% prox RCA, residual 70-80% origin of AV groove, 90% ostial PDA stenosis  . Hyperlipidemia   . Tobacco abuse   . H/O medication noncompliance     Past Surgical History  Procedure Laterality Date  . Left heart catheterization with coronary angiogram N/A 05/29/2014    Procedure: LEFT HEART CATHETERIZATION WITH CORONARY ANGIOGRAM;  Surgeon: Corky Crafts, MD;  Location: [facility] CATH LAB;  Service: Cardiovascular;  Laterality: N/A;  . Percutaneous coronary stent intervention (pci-s)  05/29/2014    Procedure: PERCUTANEOUS CORONARY STENT INTERVENTION (PCI-S);  Surgeon: Corky Crafts, MD;  Location: [facility] CATH LAB;  Service: Cardiovascular;;  . Abdominal hysterectomy    . Left heart catheterization with coronary angiogram N/A 09/07/2014    Procedure: LEFT HEART CATHETERIZATION WITH CORONARY ANGIOGRAM;  Surgeon: Marykay Lex, MD;  Location: [facility] CATH LAB;  Service: Cardiovascular;  Laterality: N/A;    History  Smoking status  . Former Smoker -- 0.00 packs/day  .  Types: Cigarettes  Smokeless tobacco  . Never Used    History  Alcohol Use No    Social History   Social History  . Marital Status: Single    Spouse Name: N/A  . Number of Children: 2  . Years of Education: N/A   Occupational History  . Not on file.   Social History Main Topics  . Smoking status: Former Smoker -- 0.00 packs/day    Types: Cigarettes  . Smokeless tobacco: Never Used  . Alcohol Use: No  . Drug Use: No  . Sexual Activity: Not on file   Other Topics Concern  . Not on file   Social History Narrative    No Known Allergies  Current Facility-Administered Medications  Medication Dose Route Frequency Provider Last Rate Last Dose  . 0.9 %  sodium chloride infusion  250 mL Intravenous PRN Runell Gess, MD      . acetaminophen (TYLENOL) tablet 650 mg  650 mg Oral Q4H PRN Runell Gess, MD   650 mg at 02/02/15  0915  . aspirin chewable tablet 81 mg  81 mg Oral Daily Runell Gess, MD   81 mg at 02/02/15 0915  . atorvastatin (LIPITOR) tablet 80 mg  80 mg Oral q1800 Marinus Maw, MD      . budesonide-formoterol [facility]) 160-4.5 MCG/ACT inhaler 2 puff  2 puff Inhalation BID Kerin Perna, MD   2 puff at 02/02/15 1132  . carvedilol (COREG) tablet 3.125 mg  3.125 mg Oral BID WC Marinus Maw, MD   3.125 mg at 02/02/15 0915  . heparin ADULT infusion 100 units/mL (25000 units/250 mL)  1,000 Units/hr Intravenous Continuous Juliette Mangle, RPH 10 mL/hr at 02/02/15 0545 1,000 Units/hr at 02/02/15 0545  . hydrALAZINE (APRESOLINE) injection 10 mg  10 mg Intravenous Q4H PRN Runell Gess, MD   10 mg at 02/01/15 1809  . insulin aspart (novoLOG) injection 0-20 Units  0-20 Units Subcutaneous TID WC Kerin Perna, MD   11 Units at 02/02/15 1204  . insulin aspart (novoLOG) injection 0-5 Units  0-5 Units Subcutaneous QHS Kerin Perna, MD      . insulin glargine (LANTUS) injection 18 Units  18 Units Subcutaneous BID Kerin Perna, MD   18 Units at 02/02/15 1206  . morphine 2 MG/ML injection 2 mg  2 mg Intravenous Q1H PRN Runell Gess, MD   2 mg at 02/02/15 0042  . nitroGLYCERIN 50 mg in dextrose 5 % 250 mL (0.2 mg/mL) infusion  0-200 mcg/min Intravenous Titrated Runell Gess, MD   Stopped at 02/01/15 2250  . ondansetron (ZOFRAN) injection 4 mg  4 mg Intravenous Q6H PRN Runell Gess, MD      . ramipril (ALTACE) capsule 2.5 mg  2.5 mg Oral Daily Marinus Maw, MD   2.5 mg at 02/02/15 1205  . sodium chloride 0.9 % injection 3 mL  3 mL Intravenous Q12H Runell Gess, MD   3 mL at 02/02/15 0915  . sodium chloride 0.9 % injection 3 mL  3 mL Intravenous PRN Runell Gess, MD        Prescriptions prior to admission  Medication Sig Dispense Refill Last Dose  . aspirin EC 81 MG tablet Take 81 mg by mouth daily.   02/01/2015 at Unknown time  . atorvastatin (LIPITOR) 80 MG tablet Take 1 tablet  (80 mg total) by mouth daily at 6 PM. (Patient taking differently: Take 80  mg by mouth daily. ) 30 tablet 5 01/31/2015 at Unknown time  . insulin glargine (LANTUS) 100 UNIT/ML injection Inject 20 Units into the skin at bedtime.    01/31/2015 at Unknown time  . lisinopril (PRINIVIL,ZESTRIL) 10 MG tablet Take 1 tablet (10 mg total) by mouth daily. 30 tablet 0 02/01/2015 at Unknown time  . metFORMIN (GLUCOPHAGE) 500 MG tablet Take 1 tablet (500 mg total) by mouth 2 (two) times daily with a meal. 60 tablet 0 02/01/2015 at Unknown time  . metoprolol tartrate (LOPRESSOR) 25 MG tablet Take 1 tablet (25 mg total) by mouth 2 (two) times daily. 180 tablet 2 02/01/2015 at 730a  . nitroGLYCERIN (NITROSTAT) 0.4 MG SL tablet Place 1 tablet (0.4 mg total) under the tongue every 5 (five) minutes as needed for chest pain. 30 tablet 6 01/31/2015 at Unknown time  . Omega-3 Fatty Acids (FISH OIL) 1000 MG CAPS Take 1,000 mg by mouth at bedtime.   01/31/2015 at Unknown time  . ticagrelor (BRILINTA) 90 MG TABS tablet Take 1 tablet (90 mg total) by mouth 2 (two) times daily. 28 tablet 0 02/01/2015 at 730a  . trolamine salicylate (ASPERCREME) 10 % cream Apply 1 application topically as needed for muscle pain (Joint pain in left thumb).   Past Month at Unknown time    Family History  Problem Relation Age of Onset  . Hypertension Mother   . Kidney failure Mother   . Heart attack Father   . Heart disease Father      Review of Systems:           Review of Systems :  [ y ] = yes, [  ] = no        General :  Weight gain [   ]    Weight loss  [   ]  Fatigue [  ]  Fever [  ]  Chills  [  ]                                Weakness  [  ]           Cardiac :  Chest pain/ pressure [ yes ]  Resting SOB [  ] exertional SOB [ yes ]                        Orthopnea [  ]  Pedal edema  [ no ]  Palpitations [  ] Syncope/presyncope                         Paroxysmal nocturnal dyspnea [  ]        Pulmonary : cough [  ]  wheezing [  ]   Hemoptysis [  ] Sputum [  ] Snoring [  ]                              Pneumothorax [  ]  Sleep apnea [  ]       GI : Vomiting [  ]  Dysphagia [  ]  Melena  [  ]  Abdominal pain [  ] BRBPR [  ]              Heart burn [  ]  Constipation [  ] Diarrhea  [  ]  Colonoscopy [  ]       GU : Hematuria [  ]  Dysuria [  ]  Nocturia [  ] UTI's [  ]       Vascular : Claudication [  ]  Rest pain [  ]  DVT [  ] Vein stripping [  ] leg ulcers [  ]                          TIA [  ] Stroke [  ]  Varicose veins [  ]       NEURO :  Headaches  [  ] Seizures [  ] Vision changes [  ] Paresthesias [  ]       Musculoskeletal :  Arthritis [  ] Gout  [  ]  Back pain [  ]  Joint pain [  ]       Skin :  Rash [  ]  Melanoma [  ]        Heme : Bleeding problems [yes  ]Clotting Disorders [  ] Anemia [  ]Blood Transfusion        Endocrine : Diabetes Mahler.Beck  ] Thyroid Disorder  [  ]       Psych : Depression [  ]  Anxiety [  ]  Psych hospitalizations [  ]                                                 Physical Exam: BP 141/78 mmHg  Pulse 82  Temp(Src) 99 F (37.2 C) (Oral)  Resp 17  Ht  (1.6 m)  Wt 168 lb 6.9 oz (76.4 kg)  BMI 29.84 kg/m2  SpO2 100%      Physical Exam  General: Middle-aged  female in no distress, obese  HEENT: Normocephalic pupils equal , dentition poor  Neck: Supple without JVD, adenopathy, or bruit Chest: Clear to auscultation, symmetrical breath sounds, no rhonchi, no tenderness             or deformity Cardiovascular: Regular rate and rhythm, no murmur, no gallop, peripheral pulses             palpable in all extremities Abdomen:  Soft, nontender, no palpable mass or organomegaly Extremities: Warm, well-perfused, no clubbing cyanosis edema or tenderness,              no venous stasis changes of the legs Rectal/GU: Deferred Neuro: Grossly non--focal and symmetrical throughout Skin: Clean and dry without rash or ulceration   Diagnostic Studies & Laboratory data:     Recent  Radiology Findings:   No results found.   I have independently reviewed the above radiologic studies.  Recent Lab Findings: Lab Results  Component Value Date   WBC 10.0 02/01/2015   HGB 14.3 02/01/2015   HCT 41.3 02/01/2015   PLT 178 02/01/2015   GLUCOSE 299* 02/02/2015   CHOL 111 01/31/2015   TRIG 114 01/31/2015   HDL 38* 01/31/2015   LDLCALC 50 01/31/2015   ALT 14 09/06/2014   AST 15 09/06/2014   NA 132* 02/02/2015   K 3.6 02/02/2015   CL 101 02/02/2015   CREATININE 0.84 02/02/2015   BUN 16 02/02/2015   CO2 23 02/02/2015   TSH 2.674 10/28/2009  INR 0.97 09/07/2014   HGBA1C 13.2* 05/31/2014      Assessment / Plan:     Probably controlled diabetic smoker with recurrent angina, recurrent severe three-vessel coronary disease with poor targets for grafting and mild-moderate LV dysfunction.   further PCI would probably not be beneficial to this patient. Will plan for Brillinta washout and then high risk CABG due to very poor targets for grafting.  Surgery scheduled for later in the week-Thursday, September 29    @ 02/02/2015 12:10 PM

## 2015-02-03 ENCOUNTER — Inpatient Hospital Stay (HOSPITAL_COMMUNITY): Payer: Medicaid Other

## 2015-02-03 DIAGNOSIS — I2510 Atherosclerotic heart disease of native coronary artery without angina pectoris: Secondary | ICD-10-CM

## 2015-02-03 LAB — CBC
HCT: 32 % — ABNORMAL LOW (ref 36.0–46.0)
Hemoglobin: 10.8 g/dL — ABNORMAL LOW (ref 12.0–15.0)
MCH: 29.3 pg (ref 26.0–34.0)
MCHC: 33.8 g/dL (ref 30.0–36.0)
MCV: 87 fL (ref 78.0–100.0)
PLATELETS: 151 10*3/uL (ref 150–400)
RBC: 3.68 MIL/uL — ABNORMAL LOW (ref 3.87–5.11)
RDW: 13.9 % (ref 11.5–15.5)
WBC: 7.4 10*3/uL (ref 4.0–10.5)

## 2015-02-03 LAB — GLUCOSE, CAPILLARY
GLUCOSE-CAPILLARY: 253 mg/dL — AB (ref 65–99)
GLUCOSE-CAPILLARY: 238 mg/dL — AB (ref 65–99)
Glucose-Capillary: 255 mg/dL — ABNORMAL HIGH (ref 65–99)
Glucose-Capillary: 216 mg/dL — ABNORMAL HIGH (ref 65–99)

## 2015-02-03 LAB — LIPID PANEL
Cholesterol: 96 mg/dL (ref 0–200)
HDL: 32 mg/dL — ABNORMAL LOW (ref >40)
LDL Cholesterol: 46 mg/dL (ref 0–99)
Total CHOL/HDL Ratio: 3 RATIO
Triglycerides: 88 mg/dL (ref <150)
VLDL: 18 mg/dL (ref 0–40)

## 2015-02-03 LAB — HEPARIN LEVEL (UNFRACTIONATED): Heparin Unfractionated: 0.56 IU/mL (ref 0.30–0.70)

## 2015-02-03 MED ORDER — NITROGLYCERIN 0.4 MG SL SUBL
0.4000 mg | SUBLINGUAL_TABLET | SUBLINGUAL | Status: DC | PRN
  Administered 2015-02-03 – 2015-02-06 (×2): 0.4 mg via SUBLINGUAL
  Filled 2015-02-03 (×3): qty 1

## 2015-02-03 MED ORDER — ACTIVE PARTNERSHIP FOR HEALTH OF YOUR HEART BOOK
Freq: Once | Status: AC
  Administered 2015-02-03: 08:00:00
  Filled 2015-02-03: qty 1

## 2015-02-03 MED ORDER — NITROGLYCERIN 0.4 MG SL SUBL
SUBLINGUAL_TABLET | SUBLINGUAL | Status: AC
  Filled 2015-02-03: qty 1

## 2015-02-03 MED ORDER — ~~LOC~~ CARDIAC SURGERY, PATIENT & FAMILY EDUCATION
Freq: Once | Status: AC
  Administered 2015-02-03: 08:00:00
  Filled 2015-02-03: qty 1

## 2015-02-03 MED ORDER — MOVING RIGHT ALONG BOOK
Freq: Once | Status: AC
  Administered 2015-02-03: 08:00:00
  Filled 2015-02-03: qty 1

## 2015-02-03 MED ORDER — INSULIN GLARGINE 100 UNIT/ML ~~LOC~~ SOLN
24.0000 [IU] | Freq: Two times a day (BID) | SUBCUTANEOUS | Status: DC
  Administered 2015-02-03 – 2015-02-05 (×4): 24 [IU] via SUBCUTANEOUS
  Filled 2015-02-03 (×5): qty 0.24

## 2015-02-03 NOTE — Progress Notes (Signed)
  Echocardiogram 2D Echocardiogram has been performed.  Arvil Chaco 02/03/2015, 3:55 PM

## 2015-02-03 NOTE — Progress Notes (Addendum)
HGB 10.8 today.  Confirmed with second lab draw.  14.3 yesterday.  Ms Blumenthal denies any overt bleeding, dark tarry stools, or pain.  Pharmacy notified, no orders given regarding heparin.  Will notify morning medical team.  7:07 am  Text sent via amion  to Flavia Shipper NP regarding lab values

## 2015-02-03 NOTE — Progress Notes (Signed)
Patient ID: TIONNE CARELLI, female   DOB: 06/28/1970, 44 y.o.   MRN: [id number]    Patient Name: [name] Date of Encounter: 02/03/2015     Active Problems:   Unstable angina   Coronary artery disease due to lipid rich plaque   CAD (coronary artery disease)    SUBJECTIVE  C/o chest pain. No sob.   CURRENT MEDS . active partnership for health of your heart book   Does not apply Once  . aspirin  81 mg Oral Daily  . atorvastatin  80 mg Oral q1800  . budesonide-formoterol  2 puff Inhalation BID  . carvedilol  3.125 mg Oral BID WC  . going for heart surgery book   Does not apply Once  . insulin aspart  0-20 Units Subcutaneous TID WC  . insulin aspart  0-5 Units Subcutaneous QHS  . insulin glargine  18 Units Subcutaneous BID  . moving right along book   Does not apply Once  . ramipril  2.5 mg Oral Daily  . sodium chloride  3 mL Intravenous Q12H    OBJECTIVE  Filed Vitals:   02/02/15 2312 02/03/15 0317 02/03/15 0750 02/03/15 0849  BP: 115/54 137/71 142/86   Pulse: 85 85    Temp: 98.8 F (37.1 C) 99.3 F (37.4 C) 98.3 F (36.8 C)   TempSrc: Oral Oral Oral   Resp: Height:      Weight:      SpO2: 100% 99% 99% 96%    Intake/Output Summary (Last 24 hours) at 02/03/15 0924 Last data filed at 02/02/15 1800  Gross per 24 hour  Intake 695.83 ml  Output    400 ml  Net 295.83 ml   Filed Weights   02/01/15 1452 02/01/15 1550 02/01/15 1745  Weight: 168 lb 14.4 oz (76.613 kg) 168 lb 14.4 oz (76.613 kg) 168 lb 6.9 oz (76.4 kg)    PHYSICAL EXAM  General: Pleasant, middle aged woman, NAD. Neuro: Alert and oriented X 3. Moves all extremities spontaneously. Psych: Normal affect. HEENT:  Normal  Neck: Supple without bruits or JVD. Lungs:  Resp regular and unlabored, CTA. Heart: RRR no s3, s4, or murmurs. Abdomen: Soft, non-tender, non-distended, BS + x 4.  Extremities: No clubbing, cyanosis or edema. DP/PT/Radials 2+ and equal  bilaterally.  Accessory Clinical Findings  CBC  Recent Labs  02/01/15 0904 02/03/15 0609  WBC 10.0 7.4  HGB 14.3 10.8*  HCT 41.3 32.0*  MCV 86.6 87.0  PLT 178 151   Basic Metabolic Panel  Recent Labs  02/01/15 0904 02/02/15 0435  NA 135 132*  K 4.2 3.6  CL 103 101  CO2 24 23  GLUCOSE 329* 299*  BUN 11 16  CREATININE 0.80 0.84  CALCIUM 9.3 8.1*   Liver Function Tests No results for input(s): AST, ALT, ALKPHOS, BILITOT, PROT, ALBUMIN in the last 72 hours. No results for input(s): LIPASE, AMYLASE in the last 72 hours. Cardiac Enzymes No results for input(s): CKTOTAL, CKMB, CKMBINDEX, TROPONINI in the last 72 hours. BNP Invalid input(s): POCBNP D-Dimer No results for input(s): DDIMER in the last 72 hours. Hemoglobin A1C No results for input(s): HGBA1C in the last 72 hours. Fasting Lipid Panel  Recent Labs  02/03/15 0235  CHOL 96  HDL 32*  LDLCALC 46  TRIG 88  CHOLHDL 3.0   Thyroid Function Tests No results for input(s): TSH, T4TOTAL, T3FREE, THYROIDAB in the last 72 hours.  Invalid input(s): FREET3  TELE  nsr  Radiology/Studies  Dg Chest 2 View  02/03/2015   CLINICAL DATA:  Shortness of breath, mid sternal chest pain.  EXAM: CHEST  2 VIEW  COMPARISON:  01/31/2015  FINDINGS: The heart size and mediastinal contours are within normal limits. Both lungs are clear. The visualized skeletal structures are unremarkable.  IMPRESSION: No active cardiopulmonary disease.   Electronically Signed   By: Charlett Nose M.D.   On: 02/03/2015 08:20   Dg Chest 2 View  01/31/2015   CLINICAL DATA:  44 year old female with acute chest pain for several weeks.  EXAM: CHEST  2 VIEW  COMPARISON:  09/06/2014 and prior radiographs  FINDINGS: The cardiomediastinal silhouette is unremarkable.  There is no evidence of focal airspace disease, pulmonary edema, suspicious pulmonary nodule/mass, pleural effusion, or pneumothorax. No acute bony abnormalities are identified.  IMPRESSION: No  active cardiopulmonary disease.   Electronically Signed   By: Harmon Pier M.D.   On: 01/31/2015 09:18    ASSESSMENT AND PLAN  1. Acute MI/3 vessel CAD/LV dysfunction 2. HTN 3. Tobacco abuse 4. Active chest pain Rec: appreciate CVTS consult. Note plans for heart cath. Will treat angina with ntg. If pain recurs, consider IV ntg. Her CABG planned for Thurs after Brilinta washout.  Gregg Taylor,M.D.  02/03/2015 9:24 AM

## 2015-02-03 NOTE — Progress Notes (Signed)
ANTICOAGULATION CONSULT NOTE - Follow Up Consult  Pharmacy Consult for heparin Indication: CAD awaiting TCTS consult  Labs:  Recent Labs  02/01/15 0904  02/02/15 0435 02/02/15 1230 02/02/15 2008 02/03/15 0235 02/03/15 0609  HGB 14.3  --   --   --   --   --  10.8*  HCT 41.3  --   --   --   --   --  32.0*  PLT 178  --   --   --   --   --  151  HEPARINUNFRC  --   < > 0.17* 0.63 0.57 0.56  --   CREATININE 0.80  --  0.84  --   --   --   --   < > = values in this interval not displayed.   Assessment: 44yo female s/p cath 9/23 found to have 99% occluded Cfx not amenable to PCI. CVTS recommending CABG next week after Brilinta washout (9/23 last dose). Heparin restarted post-cath.  Heparin level this AM is 0.56, remains therapeutic on heparin 1000 units/hr.  Hgb decreased to 10.8 today from 14.2.  Confirmed with second lab draw, RN notes patient denies any overt bleeding, dark tarry stools, or pain/abdominal pain  RN notifed Flavia Shipper NP. Pltc 151K (pltc [phone]).  CABG planned for Thurs after Brilinta washout.  Hematoma noted post cath, no progression noted.- confirmed with RN today.    Goal of Therapy:  Heparin level 0.3-0.7 units/ml   Plan:  Continue heparin at 1000/hr Daily heparin level and CBC  [name], RPh Clinical Pharmacist Pager: [phone]  02/03/2015 10:03 AM

## 2015-02-03 NOTE — Progress Notes (Signed)
2 Days Post-Op Procedure(s) (LRB): Left Heart Cath and Coronary Angiography (N/A) Subjective: Unstable angina with severe multivessel CAD after PCI Patient with small diffusely diseased poor targets Chest pain with almost any exertion Scheduled for surgery on Thursday, September 29 with brillinta  [place] Objective: Vital signs in last 24 hours: Temp:  [98 F (36.7 C)-99.3 F (37.4 C)] 98.4 F (36.9 C) (09/25 1230) Pulse Rate:  [79-94] 85 (09/25 0317) Cardiac Rhythm:  [-] Normal sinus rhythm (09/25 1230) Resp:  [16-18] 18 (09/25 0750) BP: (104-142)/(29-86) 141/85 mmHg (09/25 1230) SpO2:  [96 %-100 %] 97 % (09/25 1230)  Hemodynamic parameters for last 24 hours:   Stable Intake/Output from previous day: 09/24 0701 - 09/25 0700 In: 976.5 [P.O.:840; I.V.:136.5] Out: 400 [Urine:400] Intake/Output this shift: Total I/O In: 670 [P.O.:480; I.V.:190] Out: -   Resting comfortably  Lab Results:  Recent Labs  02/01/15 0904 02/03/15 0609  WBC 10.0 7.4  HGB 14.3 10.8*  HCT 41.3 32.0*  PLT 178 151   BMET:  Recent Labs  02/01/15 0904 02/02/15 0435  NA 135 132*  K 4.2 3.6  CL 103 101  CO2 24 23  GLUCOSE 329* 299*  BUN 11 16  CREATININE 0.80 0.84  CALCIUM 9.3 8.1*    PT/INR: No results for input(s): LABPROT, INR in the last 72 hours. ABG    Component Value Date/Time   TCO2 28 03/11/2014 1514   CBG (last 3)   Recent Labs  02/02/15 2046 02/03/15 0748 02/03/15 1229  GLUCAP 206* 253* 238*    Assessment/Plan: S/P Procedure(s) (LRB): Left Heart Cath and Coronary Angiography (N/A) CABG later this week-September 29 Remain on IV heparin and IV nitroglycerin as needed until surgery  LOS: 2 days    Kathlee Nations Trigt III 02/03/2015

## 2015-02-04 ENCOUNTER — Encounter (HOSPITAL_COMMUNITY): Payer: Self-pay | Admitting: Cardiovascular Disease

## 2015-02-04 ENCOUNTER — Inpatient Hospital Stay (HOSPITAL_COMMUNITY): Payer: Medicaid Other

## 2015-02-04 DIAGNOSIS — I739 Peripheral vascular disease, unspecified: Secondary | ICD-10-CM

## 2015-02-04 DIAGNOSIS — I779 Disorder of arteries and arterioles, unspecified: Secondary | ICD-10-CM | POA: Diagnosis present

## 2015-02-04 DIAGNOSIS — D62 Acute posthemorrhagic anemia: Secondary | ICD-10-CM

## 2015-02-04 DIAGNOSIS — Z9861 Coronary angioplasty status: Secondary | ICD-10-CM

## 2015-02-04 DIAGNOSIS — S301XXA Contusion of abdominal wall, initial encounter: Secondary | ICD-10-CM

## 2015-02-04 DIAGNOSIS — I2510 Atherosclerotic heart disease of native coronary artery without angina pectoris: Secondary | ICD-10-CM

## 2015-02-04 LAB — CBC
HEMATOCRIT: 30.2 % — AB (ref 36.0–46.0)
HEMOGLOBIN: 10.5 g/dL — AB (ref 12.0–15.0)
MCH: 30.5 pg (ref 26.0–34.0)
MCHC: 34.8 g/dL (ref 30.0–36.0)
MCV: 87.8 fL (ref 78.0–100.0)
Platelets: 159 10*3/uL (ref 150–400)
RBC: 3.44 MIL/uL — AB (ref 3.87–5.11)
RDW: 13.9 % (ref 11.5–15.5)
WBC: 7.4 10*3/uL (ref 4.0–10.5)

## 2015-02-04 LAB — GLUCOSE, CAPILLARY: GLUCOSE-CAPILLARY: 141 mg/dL — AB (ref 65–99)

## 2015-02-04 LAB — SURGICAL PCR SCREEN
MRSA, PCR: NEGATIVE
Staphylococcus aureus: NEGATIVE

## 2015-02-04 LAB — HEMOGLOBIN A1C
Hgb A1c MFr Bld: 11.7 % — ABNORMAL HIGH (ref 4.8–5.6)
Mean Plasma Glucose: 289 mg/dL

## 2015-02-04 LAB — HEPARIN LEVEL (UNFRACTIONATED): HEPARIN UNFRACTIONATED: 0.63 [IU]/mL (ref 0.30–0.70)

## 2015-02-04 MED ORDER — ALBUTEROL SULFATE (2.5 MG/3ML) 0.083% IN NEBU
2.5000 mg | INHALATION_SOLUTION | Freq: Once | RESPIRATORY_TRACT | Status: AC
  Administered 2015-02-04: 2.5 mg via RESPIRATORY_TRACT
  Filled 2015-02-04: qty 3

## 2015-02-04 NOTE — Progress Notes (Signed)
Patient says she does not have a PCP. Given DM will need one after d/c. Will ask for CM assistance. Dayna Dunn PA-C

## 2015-02-04 NOTE — Care Management Note (Signed)
Case Management Note  Patient Details  Name: [name] MRN: [id number] Date of Birth: [birth year]  Subjective/Objective:     Adm w ch pain.               Action/Plan:lives w fam   Expected Discharge Date:                  Expected Discharge Plan:  Home/Self Care  In-House Referral:     Discharge planning Services  CM Consult, [facility]  Post Acute Care Choice:    Choice offered to:     DME Arranged:    DME Agency:     HH Arranged:    HH Agency:     Status of Service:     Medicare Important Message Given:    Date Medicare IM Given:    Medicare IM give by:    Date Additional Medicare IM Given:    Additional Medicare Important Message give by:     If discussed at Long Length of Stay Meetings, dates discussed:    Additional Comments:brilinta wash out and surg on thur 9-29.pt states she has been trying to get into [facility] but no openings. Spoke w transitional care team and they need call day of disch and they will set up post hosp appt w [facility].  Hanley Hays, RN 02/04/2015, 10:47 AM

## 2015-02-04 NOTE — Progress Notes (Signed)
ANTICOAGULATION CONSULT NOTE - Follow Up Consult  Pharmacy Consult for heparin Indication: CAD awaiting CABG  Labs:  Recent Labs  02/02/15 0435  02/02/15 2008 02/03/15 0235 02/03/15 0609 02/04/15 0227  HGB  --   --   --   --  10.8* 10.5*  HCT  --   --   --   --  32.0* 30.2*  PLT  --   --   --   --  151 159  HEPARINUNFRC 0.17*  < > 0.57 0.56  --  0.63  CREATININE 0.84  --   --   --   --   --   < > = values in this interval not displayed.   Assessment: [name] admitted 02/01/2015 for UA with progressively worsening CP. Cath 9/23 showed 3vD, 99% occlusion of ost to prox LCx. Scheduled for CABG 9/29.  PMH DM, HTN, CAD s/p DES to RCA (2014), HLD  Heparin resumed post-cath. Hematoma after sheath removal which has resolved after manual compression. Last Brilinta dose on 9/23, 9/24 P2Y12 240. HL 0.63, therapeutic on heparin 1000 units/h. Hgb 14.3 on 9/24, down to 10.8 and stable at 10.5 today. Plt wnl, no bleeding noted.    Goal of Therapy:  Heparin level 0.3-0.7 units/ml   Plan:  Continue heparin at 1000/hr Daily HL and CBC Monitor s/sx bleeding CABG planned 9/29   Hillery Aldo, [place].D. PGY2 Cardiology Pharmacy Resident Pager: [phone] 02/04/2015 10:59 AM

## 2015-02-04 NOTE — Progress Notes (Signed)
Discussed sternal precautions, IS, mobility post op and d/c planning. Voiced understanding. Gave pt OHS booklet, guideline and preop video. Pt sts she will have to work on having 24 hr care at d/c, her sons are in school and work. She sounds like she doesn't want to ask anyone else to help but she is adamant that she doesn't want to go to SNF. Also discussed carb modified diet as pt is drinking regular soft drinks and received regular syrup on her tray this am. Pt sts she really likes sugar. Discussed the implications of uncontrolled diabetes. I did congratulate pt on quitting smoking. Will f/u post op (pt having CP going to [facility] therefore will not walk preop). [id number] Ethelda Chick CES, ACSM 10:38 AM 02/04/2015

## 2015-02-04 NOTE — Progress Notes (Signed)
Patient: [name] / Admit Date: 02/01/2015 / Date of Encounter: 02/04/2015, 8:35 AM   Subjective: Had chest pressure earlier this AM but it is gone now. Anxious to move forward with surgery Thursday.   Objective: Telemetry: NSR with brief spikes of sinus tach intermittently to 140s-150s. Physical Exam: Blood pressure 113/65, pulse 85, temperature 98.2 F (36.8 C), temperature source Oral, resp. rate 20, height  (1.6 m), weight 168 lb 6.9 oz (76.4 kg), SpO2 99 %. General: Well developed, well nourished AAF in no acute distress. Head: Normocephalic, atraumatic, sclera non-icteric, no xanthomas, nares are without discharge. Neck: Negative for carotid bruits. JVP not elevated. Lungs: Clear bilaterally to auscultation without wheezes, rales, or rhonchi. Breathing is unlabored. Heart: RRR S1 S2 without murmurs, rubs, or gallops.  Abdomen: Soft, non-tender, non-distended with normoactive bowel sounds. No rebound/guarding. Extremities: No clubbing or cyanosis. No edema. Distal pedal pulses are 2+ and equal bilaterally. Right groin cath site with soft hematoma, no significant extending ecchymosis Neuro: Alert and oriented X 3. Moves all extremities spontaneously. Psych:  Responds to questions appropriately with a normal affect.   Intake/Output Summary (Last 24 hours) at 02/04/15 0835 Last data filed at 02/04/15 0800  Gross per 24 hour  Intake    680 ml  Output      0 ml  Net    680 ml    Inpatient Medications:  . aspirin  81 mg Oral Daily  . atorvastatin  80 mg Oral q1800  . budesonide-formoterol  2 puff Inhalation BID  . carvedilol  3.125 mg Oral BID WC  . insulin aspart  0-20 Units Subcutaneous TID WC  . insulin aspart  0-5 Units Subcutaneous QHS  . insulin glargine  24 Units Subcutaneous BID  . ramipril  2.5 mg Oral Daily  . sodium chloride  3 mL Intravenous Q12H   Infusions:  . heparin 1,000 Units/hr (02/04/15 0700)  . nitroGLYCERIN Stopped (02/01/15 2250)     Labs:  Recent Labs  02/01/15 0904 02/02/15 0435  NA 135 132*  K 4.2 3.6  CL 103 101  CO2 24 23  GLUCOSE 329* 299*  BUN 11 16  CREATININE 0.80 0.84  CALCIUM 9.3 8.1*    Recent Labs  02/03/15 0609 02/04/15 0227  WBC 7.4 7.4  HGB 10.8* 10.5*  HCT 32.0* 30.2*  MCV 87.0 87.8  PLT 151 159    Radiology/Studies:  Dg Chest 2 View  02/03/2015   CLINICAL DATA:  Shortness of breath, mid sternal chest pain.  EXAM: CHEST  2 VIEW  COMPARISON:  01/31/2015  FINDINGS: The heart size and mediastinal contours are within normal limits. Both lungs are clear. The visualized skeletal structures are unremarkable.  IMPRESSION: No active cardiopulmonary disease.   Electronically Signed   By: Charlett Nose M.D.   On: 02/03/2015 08:20   Dg Chest 2 View  01/31/2015   CLINICAL DATA:  44 year old female with acute chest pain for several weeks.  EXAM: CHEST  2 VIEW  COMPARISON:  09/06/2014 and prior radiographs  FINDINGS: The cardiomediastinal silhouette is unremarkable.  There is no evidence of focal airspace disease, pulmonary edema, suspicious pulmonary nodule/mass, pleural effusion, or pneumothorax. No acute bony abnormalities are identified.  IMPRESSION: No active cardiopulmonary disease.   Electronically Signed   By: Harmon Pier M.D.   On: 01/31/2015 09:18     Assessment and Plan  36M with HTN, DM, HLD, tobacco abuse, CAD (s/p DES to prox RCA 08/2014), prior medication noncompliance admitted  with unstable angina, found to have surgical disease.  1. CAD/unstable angina - diffuse pattern of disease, for CABG on 02/07/15 following Brilinta washout. On ASA, BB, statin, IV heparin. Consider changing Coreg back to metoprolol for better HR control (suspect higher rates are with moving about in the room).  2. Possible ischemic cardiomyopathy - EF 35-45% by cath 02/01/15, 55-60% by echo 02/03/15. Continue BB and ACEI.  3. Right groin post-cath hematoma with associated ABL anemia - Hgb dropped from 13-14 -> 10  range, currently stable. Patient says she was told to keep pressure dressing on - will review with MD.  4. HTN - controlled.  5. Hyperlipidemia - LDL controlled at 46. Continue high dose statin.  6. Carotid artery disease 40-59% BICA by duplex this admission - will need to follow as outpatient.  7. Diabetes mellitus - A1C pending. Continue current regimen. We may need to titrate Lantus further depending on level.  SignedRonie Spies PA-C Pager: [phone]   Agree with findings by Ronie Spies PA-C  S/P LHC Friday with demonstration of 3VD, mod LV dysfunction. Ostial LCX not approp for percutaneous intervention. Apprec Dr PVT's input. Brilenta D/Cd, washing out. On IV hep. Shed for CABG Thurs. Discussed CRF modification. Exam benign. Because she has had recurrent CP on minimal exertion will keep in step down until her surgery on Thurs.    Runell Gess, M.D., FACP, [facility], [facility] [place] [address] [place], [place]  [address]  [phone] 02/04/2015 9:34 AM

## 2015-02-05 ENCOUNTER — Inpatient Hospital Stay (HOSPITAL_COMMUNITY): Payer: Medicaid Other

## 2015-02-05 ENCOUNTER — Ambulatory Visit (HOSPITAL_COMMUNITY): Payer: Medicaid Other

## 2015-02-05 DIAGNOSIS — I25110 Atherosclerotic heart disease of native coronary artery with unstable angina pectoris: Secondary | ICD-10-CM

## 2015-02-05 LAB — PULMONARY FUNCTION TEST
FEF 25-75 Post: 3.2 L/sec
FEF 25-75 Pre: 2.1 L/sec
FEF2575-%Change-Post: 52 %
FEF2575-%Pred-Post: 121 %
FEF2575-%Pred-Pre: 79 %
FEV1-%Change-Post: 12 %
FEV1-%Pred-Post: 93 %
FEV1-%Pred-Pre: 82 %
FEV1-Post: 2.24 L
FEV1-Pre: 1.98 L
FEV1FVC-%Change-Post: 0 %
FEV1FVC-%Pred-Pre: 102 %
FEV6-%Change-Post: 12 %
FEV6-%Pred-Post: 91 %
FEV6-%Pred-Pre: 81 %
FEV6-Post: 2.62 L
FEV6-Pre: 2.33 L
FEV6FVC-%Pred-Post: 102 %
FEV6FVC-%Pred-Pre: 102 %
FVC-%Change-Post: 12 %
FVC-%Pred-Post: 89 %
FVC-%Pred-Pre: 79 %
FVC-Post: 2.62 L
FVC-Pre: 2.33 L
Post FEV1/FVC ratio: 85 %
Post FEV6/FVC ratio: 100 %
Pre FEV1/FVC ratio: 85 %
Pre FEV6/FVC Ratio: 100 %

## 2015-02-05 LAB — GLUCOSE, CAPILLARY
GLUCOSE-CAPILLARY: 200 mg/dL — AB (ref 65–99)
GLUCOSE-CAPILLARY: 280 mg/dL — AB (ref 65–99)
GLUCOSE-CAPILLARY: 284 mg/dL — AB (ref 65–99)
GLUCOSE-CAPILLARY: 117 mg/dL — AB (ref 65–99)
Glucose-Capillary: 270 mg/dL — ABNORMAL HIGH (ref 65–99)
Glucose-Capillary: 301 mg/dL — ABNORMAL HIGH (ref 65–99)
Glucose-Capillary: 131 mg/dL — ABNORMAL HIGH (ref 65–99)
Glucose-Capillary: 324 mg/dL — ABNORMAL HIGH (ref 65–99)

## 2015-02-05 LAB — CBC
HEMATOCRIT: 28.4 % — AB (ref 36.0–46.0)
HEMOGLOBIN: 9.6 g/dL — AB (ref 12.0–15.0)
MCH: 29.6 pg (ref 26.0–34.0)
MCHC: 33.8 g/dL (ref 30.0–36.0)
MCV: 87.7 fL (ref 78.0–100.0)
Platelets: 149 10*3/uL — ABNORMAL LOW (ref 150–400)
RBC: 3.24 MIL/uL — ABNORMAL LOW (ref 3.87–5.11)
RDW: 13.8 % (ref 11.5–15.5)
WBC: 8.4 10*3/uL (ref 4.0–10.5)

## 2015-02-05 LAB — BASIC METABOLIC PANEL
Anion gap: 7 (ref 5–15)
BUN: 5 mg/dL — AB (ref 6–20)
CHLORIDE: 105 mmol/L (ref 101–111)
CO2: 26 mmol/L (ref 22–32)
CREATININE: 0.63 mg/dL (ref 0.44–1.00)
Calcium: 8.5 mg/dL — ABNORMAL LOW (ref 8.9–10.3)
GFR calc non Af Amer: >60 mL/min (ref >60)
GLUCOSE: 226 mg/dL — AB (ref 65–99)
Potassium: 3.4 mmol/L — ABNORMAL LOW (ref 3.5–5.1)
Sodium: 138 mmol/L (ref 135–145)

## 2015-02-05 LAB — HEPARIN LEVEL (UNFRACTIONATED): Heparin Unfractionated: 0.54 IU/mL (ref 0.30–0.70)

## 2015-02-05 MED ORDER — INSULIN GLARGINE 100 UNIT/ML ~~LOC~~ SOLN
28.0000 [IU] | Freq: Two times a day (BID) | SUBCUTANEOUS | Status: DC
  Administered 2015-02-05 – 2015-02-06 (×3): 28 [IU] via SUBCUTANEOUS
  Filled 2015-02-05 (×4): qty 0.28

## 2015-02-05 MED ORDER — POTASSIUM CHLORIDE CRYS ER 20 MEQ PO TBCR
40.0000 meq | EXTENDED_RELEASE_TABLET | Freq: Once | ORAL | Status: AC
  Administered 2015-02-05: 40 meq via ORAL
  Filled 2015-02-05: qty 2

## 2015-02-05 MED ORDER — CARVEDILOL 6.25 MG PO TABS
6.2500 mg | ORAL_TABLET | Freq: Two times a day (BID) | ORAL | Status: DC
  Administered 2015-02-05 – 2015-02-06 (×3): 6.25 mg via ORAL
  Filled 2015-02-05 (×3): qty 1

## 2015-02-05 MED ORDER — ALBUTEROL SULFATE (2.5 MG/3ML) 0.083% IN NEBU
2.5000 mg | INHALATION_SOLUTION | Freq: Once | RESPIRATORY_TRACT | Status: AC
  Administered 2015-02-05: 2.5 mg via RESPIRATORY_TRACT

## 2015-02-05 MED ORDER — SORBITOL 70 % PO SOLN
60.0000 mL | Freq: Once | ORAL | Status: AC
  Administered 2015-02-05: 60 mL via ORAL
  Filled 2015-02-05 (×2): qty 60

## 2015-02-05 NOTE — Plan of Care (Signed)
Problem: Phase I - Pre-Op Goal: Pre-Op Education completed Outcome: Progressing Patient saw  Video [id number] and has heart surgery book

## 2015-02-05 NOTE — Progress Notes (Addendum)
Pre-op Cardiac Surgery  Carotid Findings:  Bilateral: 40-59% ICA stenosis.  Vertebral artery flow is antegrade.   Upper Extremity Right Left  Brachial Pressures 122 Triphasic 121 Triphasic  Radial Waveforms Triphasic Triphasic  Ulnar Waveforms Triphasic Triphasic  Palmar Arch (Allen's Test) Abnormal Normal   Findings:  Right Doppler waveforms remained normal with radial compression and diminished  Greater than 50% with ulnar compression. Left Doppler waveforms remained normal with both radial and ulnar compressions.    Lower  Extremity Right Left  Dorsalis Pedis 156 Triphasic 156 Triphasic  Posterior Tibial 156 Triphasic 131 Triphasic  Ankle/Brachial Indices 1.3 1.3    Findings:  ABIs and Doppler waveforms indicate normal arterial flow at rest.

## 2015-02-05 NOTE — Progress Notes (Addendum)
Patient Name: [name] Date of Encounter: 02/05/2015  Principal Problem:   Coronary artery disease involving native coronary artery with unstable angina pectoris Active Problems:   Unstable angina   CAD S/P percutaneous coronary angioplasty   Essential hypertension   Hyperlipidemia with target LDL less than 70   Diabetes mellitus type 2 with complications   Carotid artery disease   Groin hematoma   Anemia associated with acute blood loss    SUBJECTIVE  Had CP overnight but now resolved. Her hemoglobin dropped again today. She stated that she had noticed blood in her stool prior to admission on Brilinta.   CURRENT MEDS . aspirin  81 mg Oral Daily  . atorvastatin  80 mg Oral q1800  . budesonide-formoterol  2 puff Inhalation BID  . carvedilol  3.125 mg Oral BID WC  . insulin aspart  0-20 Units Subcutaneous TID WC  . insulin aspart  0-5 Units Subcutaneous QHS  . insulin glargine  24 Units Subcutaneous BID  . ramipril  2.5 mg Oral Daily  . sodium chloride  3 mL Intravenous Q12H    OBJECTIVE  Filed Vitals:   02/04/15 2339 02/05/15 0346 02/05/15 0355 02/05/15 0739  BP: 108/62 116/65  138/78  Pulse:    98  Temp: 98.1 F (36.7 C)  98.5 F (36.9 C) 98.5 F (36.9 C)  TempSrc: Oral  Oral Oral  Resp: Height:      Weight:      SpO2: 100% 100% 100% 100%    Intake/Output Summary (Last 24 hours) at 02/05/15 0754 Last data filed at 02/05/15 0700  Gross per 24 hour  Intake   1050 ml  Output    200 ml  Net    850 ml   Filed Weights   02/01/15 1452 02/01/15 1550 02/01/15 1745  Weight: 168 lb 14.4 oz (76.613 kg) 168 lb 14.4 oz (76.613 kg) 168 lb 6.9 oz (76.4 kg)    PHYSICAL EXAM  General: Pleasant, NAD. Neuro: Alert and oriented X 3. Moves all extremities spontaneously. Psych: Normal affect. HEENT:  Normal  Neck: Supple without bruits or JVD. Lungs:  Resp regular and unlabored, CTA. Heart: RRR no s3, s4, or murmurs. Abdomen: Soft, non-tender,  non-distended, BS + x 4.  Extremities: No clubbing, cyanosis or edema. DP/PT/Radials 2+ and equal bilaterally. Right groin cath site with soft hematoma, mild ecchymosis  Accessory Clinical Findings  CBC  Recent Labs  02/04/15 0227 02/05/15 0235  WBC 7.4 8.4  HGB 10.5* 9.6*  HCT 30.2* 28.4*  MCV 87.8 87.7  PLT 159 149*   Basic Metabolic Panel  Recent Labs  02/05/15 0235  NA 138  K 3.4*  CL 105  CO2 26  GLUCOSE 226*  BUN 5*  CREATININE 0.63  CALCIUM 8.5*   Hemoglobin A1C  Recent Labs  02/03/15 0235  HGBA1C 11.7*   Fasting Lipid Panel  Recent Labs  02/03/15 0235  CHOL 96  HDL 32*  LDLCALC 46  TRIG 88  CHOLHDL 3.0   Thyroid Function Tests No results for input(s): TSH, T4TOTAL, T3FREE, THYROIDAB in the last 72 hours.  Invalid input(s): FREET3  TELE  NSR at rate of 70-80s. Transiently in 120s.   Radiology/Studies  Dg Chest 2 View  02/03/2015   CLINICAL DATA:  Shortness of breath, mid sternal chest pain.  EXAM: CHEST  2 VIEW  COMPARISON:  01/31/2015  FINDINGS: The heart size and mediastinal contours are within normal limits. Both lungs  are clear. The visualized skeletal structures are unremarkable.  IMPRESSION: No active cardiopulmonary disease.   Electronically Signed   By: Charlett Nose M.D.   On: 02/03/2015 08:20   Dg Chest 2 View  01/31/2015   CLINICAL DATA:  44 year old female with acute chest pain for several weeks.  EXAM: CHEST  2 VIEW  COMPARISON:  09/06/2014 and prior radiographs  FINDINGS: The cardiomediastinal silhouette is unremarkable.  There is no evidence of focal airspace disease, pulmonary edema, suspicious pulmonary nodule/mass, pleural effusion, or pneumothorax. No acute bony abnormalities are identified.  IMPRESSION: No active cardiopulmonary disease.   Electronically Signed   By: Harmon Pier M.D.   On: 01/31/2015 09:18    ASSESSMENT AND PLAN  7M with HTN, DM, HLD, tobacco abuse, CAD (s/p DES to prox RCA 08/2014), prior medication  noncompliance admitted with unstable angina, found to have surgical disease.  1. CAD/unstable angina - diffuse pattern of disease, for CABG on 02/07/15 following Brilinta washout. On ASA, BB, statin, IV heparin. Consider changing Coreg back to metoprolol for better HR control (suspect higher rates with minimal extertion).  2. Possible ischemic cardiomyopathy - EF 35-45% by cath 02/01/15, 55-60% by echo 02/03/15. Continue BB and ACEI.  3. Right groin post-cath hematoma with associated ABL anemia - Hgb dropped from 13-14 -> 10 range-> today dropped to 9.6.  Per patient, hematoma is stable. She had noticed blood in her stool prior to admission. Will get stool guaiac.   4. HTN - controlled.  5. Hyperlipidemia - LDL controlled at 46. Continue high dose statin.  6. Carotid artery disease 40-59% BICA by duplex this admission - will need to follow as outpatient.  7. Diabetes mellitus - A1C11.7. Continue current regimen. She does not have PCP. Will need one.   8. Hypokalemia- K of 3.4. Will give supplement.   Signed, Bhagat,Bhavinkumar PA-C   Agree with note by Chelsea Aus PA-C  On IV hep. For CABG Thurs after Brilenta wash out. She had a brief episode of CP. HR still mildly increased. WIll titrate coreg.     Runell Gess, M.D., FACP, [facility], [place] [facility] [address] [place], [place]  [address]  [phone] 02/05/2015 8:43 AM

## 2015-02-05 NOTE — Progress Notes (Signed)
Inpatient Diabetes Program Recommendations  AACE/ADA: New Consensus Statement on Inpatient Glycemic Control (2015)  Target Ranges:  Prepandial:   less than 140 mg/dL      Peak postprandial:   less than 180 mg/dL (1-2 hours)      Critically ill patients:  140 - 180 mg/dL   Review of Glycemic Control  Results for SAFINA, HUARD (MRN [id number]) as of 02/05/2015 12:20  Ref. Range 02/05/2015 02:35  Glucose Latest Ref Range: 65-99 mg/dL 295 (H)    Inpatient Diabetes Program Recommendations:   Insulin - Basal: Consider increasing Lantus to 26 units QHS with FBS > 200 mg/dL.   Will continue to follow. Thank you. Ailene Ards, RD, LDN, CDE Inpatient Diabetes Coordinator [phone]

## 2015-02-05 NOTE — Progress Notes (Signed)
ANTICOAGULATION CONSULT NOTE - Follow Up Consult  Pharmacy Consult for heparin Indication: CAD awaiting CABG  Labs:  Recent Labs  02/03/15 0235  02/03/15 0609 02/04/15 0227 02/05/15 0235  HGB  --   < > 10.8* 10.5* 9.6*  HCT  --   --  32.0* 30.2* 28.4*  PLT  --   --  151 159 149*  HEPARINUNFRC 0.56  --   --  0.63 0.54  CREATININE  --   --   --   --  0.63  < > = values in this interval not displayed.   Assessment: [name] admitted 02/01/2015 for UA with progressively worsening CP. Cath 9/23 showed 3vD, 99% occlusion of ost to prox LCx. Scheduled for CABG 9/29.  PMH DM, HTN, CAD s/p DES to RCA (2014), HLD  Heparin resumed post-cath. Hematoma after sheath removal which has resolved after manual compression. Last Brilinta dose on 9/23, 9/24 P2Y12 240. HL 0.54, therapeutic on heparin 1000 units/h. No bleeding noted per nurse.   Goal of Therapy:  Heparin level 0.3-0.7 units/ml   Plan:  Continue heparin at 1000/hr Daily HL and CBC Monitor s/sx bleeding CABG planned 9/29   Sherron Monday, PharmD Clinical Pharmacy Resident Pager: [phone] 02/05/2015 8:59 AM

## 2015-02-05 NOTE — Progress Notes (Signed)
4 Days Post-Op Procedure(s) (LRB): Left Heart Cath and Coronary Angiography (N/A) Subjective: CABG 9-29 am Dopplers ok A-1-c 11.4 Objective: Vital signs in last 24 hours: Temp:  [97.8 F (36.6 C)-98.5 F (36.9 C)] 98.1 F (36.7 C) (09/27 1630) Pulse Rate:  [76-98] 76 (09/27 1615) Cardiac Rhythm:  [-] Normal sinus rhythm (09/27 1615) Resp:  [17-20] 17 (09/27 1615) BP: (108-138)/(62-83) 136/83 mmHg (09/27 1615) SpO2:  [98 %-100 %] 98 % (09/27 1615)  Hemodynamic parameters for last 24 hours:   nsr Intake/Output from previous day: 09/26 0701 - 09/27 0700 In: 1050 [P.O.:820; I.V.:230] Out: 200 [Urine:200] Intake/Output this shift: Total I/O In: 650 [P.O.:550; I.V.:100] Out: -   No edema  Lab Results:  Recent Labs  02/04/15 0227 02/05/15 0235  WBC 7.4 8.4  HGB 10.5* 9.6*  HCT 30.2* 28.4*  PLT 159 149*   BMET:  Recent Labs  02/05/15 0235  NA 138  K 3.4*  CL 105  CO2 26  GLUCOSE 226*  BUN 5*  CREATININE 0.63  CALCIUM 8.5*    PT/INR: No results for input(s): LABPROT, INR in the last 72 hours. ABG    Component Value Date/Time   TCO2 28 03/11/2014 1514   CBG (last 3)   Recent Labs  02/03/15 1657 02/03/15 2126 02/04/15 2132  GLUCAP 255* 216* 141*    Assessment/Plan: S/P Procedure(s) (LRB): Left Heart Cath and Coronary Angiography (N/A) DM control Stop heparin on call to OR thurs am   LOS: 4 days    [name] 02/05/2015

## 2015-02-05 NOTE — Progress Notes (Deleted)
VASCULAR LAB PRELIMINARY  PRELIMINARY  PRELIMINARY  PRELIMINARY  Pre-op Cardiac Surgery  Carotid Findings:    Upper Extremity Right Left  Brachial Pressures 122 Triphasic 121 Triphasic  Radial Waveforms Triphasic Triphasic  Ulnar Waveforms Triphasic Triphasic  Palmar Arch (Allen's Test) Abnormal Normal   Findings:  Right Doppler waveforms remained normal with radial compression and diminished with ulnar compression. Left Doppler waveforms remained normal with both radial and ulnar compressions.    Lower  Extremity Right Left  Dorsalis Pedis 156 Triphasic 156 Triphasic  Posterior Tibial 156 Triphasic 131 Triphasic  Ankle/Brachial Indices 1.3 1.3    Findings:  ABIs and Doppler waveforms indicate normal arterial flow bilaterally at rest   [name], [name], RVS 02/05/2015, 12:12 PM

## 2015-02-06 ENCOUNTER — Encounter (HOSPITAL_COMMUNITY): Payer: Self-pay

## 2015-02-06 LAB — BLOOD GAS, ARTERIAL
Acid-base deficit: 0.2 mmol/L (ref 0.0–2.0)
Bicarbonate: 23.8 mEq/L (ref 20.0–24.0)
Drawn by: 441381
FIO2: 21
O2 Saturation: 97.2 %
Patient temperature: 98.6
TCO2: 25 mmol/L (ref 0–100)
pCO2 arterial: 38.3 mmHg (ref 35.0–45.0)
pH, Arterial: 7.41 (ref 7.350–7.450)
pO2, Arterial: 91.6 mmHg (ref 80.0–100.0)

## 2015-02-06 LAB — BASIC METABOLIC PANEL
Anion gap: 6 (ref 5–15)
BUN: 7 mg/dL (ref 6–20)
CALCIUM: 8.2 mg/dL — AB (ref 8.9–10.3)
CO2: 25 mmol/L (ref 22–32)
CREATININE: 0.62 mg/dL (ref 0.44–1.00)
Chloride: 105 mmol/L (ref 101–111)
GFR calc Af Amer: >60 mL/min (ref >60)
GLUCOSE: 83 mg/dL (ref 65–99)
Potassium: 3.3 mmol/L — ABNORMAL LOW (ref 3.5–5.1)
SODIUM: 136 mmol/L (ref 135–145)

## 2015-02-06 LAB — CBC
HCT: 28.3 % — ABNORMAL LOW (ref 36.0–46.0)
Hemoglobin: 9.3 g/dL — ABNORMAL LOW (ref 12.0–15.0)
MCH: 28.8 pg (ref 26.0–34.0)
MCHC: 32.9 g/dL (ref 30.0–36.0)
MCV: 87.6 fL (ref 78.0–100.0)
PLATELETS: 160 10*3/uL (ref 150–400)
RBC: 3.23 MIL/uL — ABNORMAL LOW (ref 3.87–5.11)
RDW: 13.9 % (ref 11.5–15.5)
WBC: 6.3 10*3/uL (ref 4.0–10.5)

## 2015-02-06 LAB — GLUCOSE, CAPILLARY
Glucose-Capillary: 87 mg/dL (ref 65–99)
Glucose-Capillary: 226 mg/dL — ABNORMAL HIGH (ref 65–99)
Glucose-Capillary: 248 mg/dL — ABNORMAL HIGH (ref 65–99)
Glucose-Capillary: 174 mg/dL — ABNORMAL HIGH (ref 65–99)

## 2015-02-06 LAB — ABO/RH: ABO/RH(D): O POS

## 2015-02-06 LAB — OCCULT BLOOD X 1 CARD TO LAB, STOOL: Fecal Occult Bld: NEGATIVE

## 2015-02-06 LAB — PREPARE RBC (CROSSMATCH)

## 2015-02-06 LAB — HEPARIN LEVEL (UNFRACTIONATED): Heparin Unfractionated: 0.38 IU/mL (ref 0.30–0.70)

## 2015-02-06 MED ORDER — PAPAVERINE HCL 30 MG/ML IJ SOLN
INTRAMUSCULAR | Status: AC
  Administered 2015-02-07: 500 mL
  Filled 2015-02-06: qty 2.5

## 2015-02-06 MED ORDER — DEXTROSE 5 % IV SOLN
750.0000 mg | INTRAVENOUS | Status: DC
  Filled 2015-02-06: qty 750

## 2015-02-06 MED ORDER — PHENYLEPHRINE HCL 10 MG/ML IJ SOLN
30.0000 ug/min | INTRAVENOUS | Status: AC
  Administered 2015-02-07: 10 ug/min via INTRAVENOUS
  Filled 2015-02-06 (×2): qty 2

## 2015-02-06 MED ORDER — DEXTROSE 5 % IV SOLN
1.5000 g | INTRAVENOUS | Status: AC
  Administered 2015-02-07: 1.5 g via INTRAVENOUS
  Administered 2015-02-07: .75 g via INTRAVENOUS
  Filled 2015-02-06: qty 1.5

## 2015-02-06 MED ORDER — SODIUM CHLORIDE 0.9 % IV SOLN
1250.0000 mg | INTRAVENOUS | Status: AC
  Administered 2015-02-07: 1250 mg via INTRAVENOUS
  Filled 2015-02-06: qty 1250

## 2015-02-06 MED ORDER — CHLORHEXIDINE GLUCONATE 4 % EX LIQD
60.0000 mL | Freq: Once | CUTANEOUS | Status: AC
Start: 2015-02-06 — End: 2015-02-06
  Administered 2015-02-06: 4 via TOPICAL
  Filled 2015-02-06: qty 15

## 2015-02-06 MED ORDER — POTASSIUM CHLORIDE CRYS ER 20 MEQ PO TBCR
40.0000 meq | EXTENDED_RELEASE_TABLET | Freq: Three times a day (TID) | ORAL | Status: AC
  Administered 2015-02-06 (×2): 40 meq via ORAL
  Filled 2015-02-06 (×2): qty 2

## 2015-02-06 MED ORDER — METOPROLOL TARTRATE 12.5 MG HALF TABLET
12.5000 mg | ORAL_TABLET | Freq: Once | ORAL | Status: AC
  Administered 2015-02-07: 12.5 mg via ORAL
  Filled 2015-02-06: qty 1

## 2015-02-06 MED ORDER — DEXMEDETOMIDINE HCL IN NACL 400 MCG/100ML IV SOLN
0.1000 ug/kg/h | INTRAVENOUS | Status: AC
  Administered 2015-02-07: .3 ug/kg/h via INTRAVENOUS
  Filled 2015-02-06: qty 100

## 2015-02-06 MED ORDER — DOPAMINE-DEXTROSE 3.2-5 MG/ML-% IV SOLN
0.0000 ug/kg/min | INTRAVENOUS | Status: DC
  Filled 2015-02-06: qty 250

## 2015-02-06 MED ORDER — TEMAZEPAM 15 MG PO CAPS: 15.0000 mg | ORAL_CAPSULE | Freq: Once | ORAL | Status: DC | PRN

## 2015-02-06 MED ORDER — BISACODYL 5 MG PO TBEC
5.0000 mg | DELAYED_RELEASE_TABLET | Freq: Once | ORAL | Status: AC
  Administered 2015-02-06: 5 mg via ORAL
  Filled 2015-02-06: qty 1

## 2015-02-06 MED ORDER — ALPRAZOLAM 0.25 MG PO TABS: 0.2500 mg | ORAL_TABLET | ORAL | Status: DC | PRN

## 2015-02-06 MED ORDER — DIAZEPAM 5 MG PO TABS
5.0000 mg | ORAL_TABLET | Freq: Once | ORAL | Status: AC
  Administered 2015-02-07: 5 mg via ORAL
  Filled 2015-02-06: qty 1

## 2015-02-06 MED ORDER — CHLORHEXIDINE GLUCONATE 4 % EX LIQD
60.0000 mL | Freq: Once | CUTANEOUS | Status: AC
  Administered 2015-02-07: 4 via TOPICAL
  Filled 2015-02-06: qty 60

## 2015-02-06 MED ORDER — CHLORHEXIDINE GLUCONATE 0.12 % MT SOLN
15.0000 mL | Freq: Once | OROMUCOSAL | Status: AC
  Administered 2015-02-07: 15 mL via OROMUCOSAL
  Filled 2015-02-06: qty 15

## 2015-02-06 MED ORDER — SODIUM CHLORIDE 0.9 % IV SOLN
INTRAVENOUS | Status: AC
  Administered 2015-02-07: 70 mL via INTRAVENOUS
  Filled 2015-02-06: qty 40

## 2015-02-06 MED ORDER — SODIUM CHLORIDE 0.9 % IV SOLN
INTRAVENOUS | Status: AC
  Administered 2015-02-07: .6 [IU]/h via INTRAVENOUS
  Filled 2015-02-06: qty 2.5

## 2015-02-06 MED ORDER — NITROGLYCERIN IN D5W 200-5 MCG/ML-% IV SOLN
2.0000 ug/min | INTRAVENOUS | Status: AC
  Administered 2015-02-07: 5 ug/min via INTRAVENOUS
  Filled 2015-02-06: qty 250

## 2015-02-06 MED ORDER — SODIUM CHLORIDE 0.9 % IV SOLN
INTRAVENOUS | Status: DC
  Filled 2015-02-06: qty 30

## 2015-02-06 MED ORDER — EPINEPHRINE HCL 1 MG/ML IJ SOLN
0.0000 ug/min | INTRAMUSCULAR | Status: DC
  Filled 2015-02-06: qty 4

## 2015-02-06 MED ORDER — POTASSIUM CHLORIDE 2 MEQ/ML IV SOLN
80.0000 meq | INTRAVENOUS | Status: DC
  Filled 2015-02-06: qty 40

## 2015-02-06 MED ORDER — MAGNESIUM SULFATE 50 % IJ SOLN
40.0000 meq | INTRAMUSCULAR | Status: DC
  Filled 2015-02-06: qty 10

## 2015-02-06 NOTE — Progress Notes (Signed)
ANTICOAGULATION CONSULT NOTE - Follow Up Consult  Pharmacy Consult for heparin Indication: CAD awaiting CABG  Labs:  Recent Labs  02/04/15 0227 02/05/15 0235 02/06/15 0300  HGB 10.5* 9.6* 9.3*  HCT 30.2* 28.4* 28.3*  PLT 159 149* 160  HEPARINUNFRC 0.63 0.54 0.38  CREATININE  --  0.63 0.62     Assessment: 44 YOF admitted 02/01/2015 for UA with progressively worsening CP. Cath 9/23 showed 3vD, 99% occlusion of ost to prox LCx. Scheduled for CABG 9/29.  PMH DM, HTN, CAD s/p DES to RCA (2014), HLD  Heparin resumed post-cath. Hematoma after sheath removal which has resolved after manual compression. Last Brilinta dose on 9/23, 9/24 P2Y12 240. HL 0.38, therapeutic on heparin 1000 units/h. No bleeding noted per nurse.   Goal of Therapy:  Heparin level 0.3-0.7 units/ml   Plan:  Continue heparin at 1000/hr Daily HL and CBC Monitor s/sx bleeding CABG planned 9/29   Sherron Monday, PharmD Clinical Pharmacy Resident Pager: [phone] 02/06/2015 8:16 AM

## 2015-02-06 NOTE — Progress Notes (Signed)
Patient Name: [name] Date of Encounter: 02/06/2015  Principal Problem:   Coronary artery disease involving native coronary artery with unstable angina pectoris Active Problems:   Unstable angina   CAD S/P percutaneous coronary angioplasty   Essential hypertension   Hyperlipidemia with target LDL less than 70   Diabetes mellitus type 2 with complications   Carotid artery disease   Groin hematoma   Anemia associated with acute blood loss    SUBJECTIVE  Feeling well. No chest pain, sob or palpitations. CABG tomorrow.   CURRENT MEDS . aspirin  81 mg Oral Daily  . atorvastatin  80 mg Oral q1800  . budesonide-formoterol  2 puff Inhalation BID  . carvedilol  6.25 mg Oral BID WC  . insulin aspart  0-20 Units Subcutaneous TID WC  . insulin aspart  0-5 Units Subcutaneous QHS  . insulin glargine  28 Units Subcutaneous BID  . ramipril  2.5 mg Oral Daily  . sodium chloride  3 mL Intravenous Q12H    OBJECTIVE  Filed Vitals:   02/06/15 0000 02/06/15 0400 02/06/15 0424 02/06/15 0733  BP: 111/55 97/62  113/75  Pulse: 78 76    Temp:   98 F (36.7 C) 98.6 F (37 C)  TempSrc:   Oral Oral  Resp: 20   16  Height:      Weight:      SpO2: 100%  100% 99%    Intake/Output Summary (Last 24 hours) at 02/06/15 0848 Last data filed at 02/06/15 0800  Gross per 24 hour  Intake    910 ml  Output      0 ml  Net    910 ml   Filed Weights   02/01/15 1452 02/01/15 1550 02/01/15 1745  Weight: 168 lb 14.4 oz (76.613 kg) 168 lb 14.4 oz (76.613 kg) 168 lb 6.9 oz (76.4 kg)    PHYSICAL EXAM  General: Pleasant, NAD. Neuro: Alert and oriented X 3. Moves all extremities spontaneously. Psych: Normal affect. HEENT:  Normal  Neck: Supple without bruits or JVD. Lungs:  Resp regular and unlabored, CTA. Heart: RRR no s3, s4, or murmurs. Abdomen: Soft, non-tender, non-distended, BS + x 4.  Extremities: No clubbing, cyanosis or edema. DP/PT/Radials 2+ and equal bilaterally. Right groin  cath site with soft hematoma, mild ecchymosis  Accessory Clinical Findings  CBC  Recent Labs  02/05/15 0235 02/06/15 0300  WBC 8.4 6.3  HGB 9.6* 9.3*  HCT 28.4* 28.3*  MCV 87.7 87.6  PLT 149* 160   Basic Metabolic Panel  Recent Labs  02/05/15 0235 02/06/15 0300  NA 138 136  K 3.4* 3.3*  CL 105 105  CO2 26 25  GLUCOSE 226* 83  BUN 5* 7  CREATININE 0.63 0.62  CALCIUM 8.5* 8.2*    TELE  NSR at rate of 70-80s.  Radiology/Studies  Dg Chest 2 View  02/03/2015   CLINICAL DATA:  Shortness of breath, mid sternal chest pain.  EXAM: CHEST  2 VIEW  COMPARISON:  01/31/2015  FINDINGS: The heart size and mediastinal contours are within normal limits. Both lungs are clear. The visualized skeletal structures are unremarkable.  IMPRESSION: No active cardiopulmonary disease.   Electronically Signed   By: Charlett Nose M.D.   On: 02/03/2015 08:20   Dg Chest 2 View  01/31/2015   CLINICAL DATA:  44 year old female with acute chest pain for several weeks.  EXAM: CHEST  2 VIEW  COMPARISON:  09/06/2014 and prior radiographs  FINDINGS: The cardiomediastinal  silhouette is unremarkable.  There is no evidence of focal airspace disease, pulmonary edema, suspicious pulmonary nodule/mass, pleural effusion, or pneumothorax. No acute bony abnormalities are identified.  IMPRESSION: No active cardiopulmonary disease.   Electronically Signed   By: Harmon Pier M.D.   On: 01/31/2015 09:18    ASSESSMENT AND PLAN  46M with HTN, DM, HLD, tobacco abuse, CAD (s/p DES to prox RCA 08/2014), prior medication noncompliance admitted with unstable angina, found to have surgical disease.  1. CAD/unstable angina - diffuse pattern of disease, for CABG on 02/07/15 following Brilinta washout. On ASA, BB, ACE statin, IV heparin. Titrated coreg.  2. Possible ischemic cardiomyopathy - EF 35-45% by cath 02/01/15, 55-60% by echo 02/03/15. Continue BB and ACEI.  3. Right groin post-cath hematoma with associated ABL anemia - Hgb  dropped from 13-14 -> 10 range-> today dropped to 9.3 Continue to follow.  4. HTN - controlled.  5. Hyperlipidemia - LDL controlled at 46. Continue high dose statin.  6. Carotid artery disease 40-59% BICA by duplex this admission - will need to follow as outpatient.  7. Diabetes mellitus - A1C11.7. Continue current regimen. She does not have PCP. Will need one.   8. Hypokalemia- K of 3.3. Will give supplement Kdur 40mb BID.   Signed, Bhagat,Bhavinkumar PA-C  Agree with note by Chelsea Aus PA-C  On IV hep. One episode CP yesterday. K 3.3--->> replete. For CABG tomorrow morning  Runell Gess, M.D., FACP, [facility], [facility] [address] [place], [place]  [address]  [phone] 02/06/2015 9:24 AM

## 2015-02-07 ENCOUNTER — Inpatient Hospital Stay (HOSPITAL_COMMUNITY)
Admit: 2015-02-07 | Discharge: 2015-02-07 | Disposition: A | Discharge status: Home / Self Care | Payer: Medicaid Other | Attending: Cardiothoracic Surgery | Admitting: Cardiothoracic Surgery

## 2015-02-07 ENCOUNTER — Inpatient Hospital Stay (HOSPITAL_COMMUNITY): Payer: Medicaid Other

## 2015-02-07 ENCOUNTER — Encounter (HOSPITAL_COMMUNITY)
Admission: EM | Disposition: A | Discharge status: Home / Self Care | Payer: Self-pay | Source: Home / Self Care | Attending: Cardiothoracic Surgery

## 2015-02-07 ENCOUNTER — Other Ambulatory Visit: Payer: Self-pay

## 2015-02-07 ENCOUNTER — Inpatient Hospital Stay (HOSPITAL_COMMUNITY): Payer: Medicaid Other | Admitting: Anesthesiology

## 2015-02-07 DIAGNOSIS — I469 Cardiac arrest, cause unspecified: Secondary | ICD-10-CM

## 2015-02-07 DIAGNOSIS — I25110 Atherosclerotic heart disease of native coronary artery with unstable angina pectoris: Secondary | ICD-10-CM

## 2015-02-07 DIAGNOSIS — Z951 Presence of aortocoronary bypass graft: Secondary | ICD-10-CM

## 2015-02-07 HISTORY — PX: TEE WITHOUT CARDIOVERSION: SHX5443

## 2015-02-07 HISTORY — PX: CORONARY ARTERY BYPASS GRAFT: SHX141

## 2015-02-07 HISTORY — PX: CARDIAC CATHETERIZATION: SHX172

## 2015-02-07 LAB — POCT I-STAT, CHEM 8
BUN: 6 mg/dL (ref 6–20)
BUN: 6 mg/dL (ref 6–20)
BUN: 5 mg/dL — AB (ref 6–20)
BUN: 5 mg/dL — ABNORMAL LOW (ref 6–20)
BUN: 4 mg/dL — ABNORMAL LOW (ref 6–20)
BUN: 6 mg/dL (ref 6–20)
CALCIUM ION: 1.22 mmol/L (ref 1.12–1.23)
CALCIUM ION: 1.22 mmol/L (ref 1.12–1.23)
CALCIUM ION: 1 mmol/L — AB (ref 1.12–1.23)
CALCIUM ION: 1 mmol/L — AB (ref 1.12–1.23)
CALCIUM ION: 1.05 mmol/L — AB (ref 1.12–1.23)
CALCIUM ION: 1.06 mmol/L — AB (ref 1.12–1.23)
CHLORIDE: 106 mmol/L (ref 101–111)
CHLORIDE: 107 mmol/L (ref 101–111)
CHLORIDE: 101 mmol/L (ref 101–111)
CHLORIDE: 107 mmol/L (ref 101–111)
CREATININE: 0.4 mg/dL — AB (ref 0.44–1.00)
Chloride: 106 mmol/L (ref 101–111)
Chloride: 106 mmol/L (ref 101–111)
Creatinine, Ser: 0.4 mg/dL — ABNORMAL LOW (ref 0.44–1.00)
Creatinine, Ser: 0.4 mg/dL — ABNORMAL LOW (ref 0.44–1.00)
Creatinine, Ser: 0.4 mg/dL — ABNORMAL LOW (ref 0.44–1.00)
Creatinine, Ser: 0.4 mg/dL — ABNORMAL LOW (ref 0.44–1.00)
Creatinine, Ser: 0.6 mg/dL (ref 0.44–1.00)
GLUCOSE: 88 mg/dL (ref 65–99)
GLUCOSE: 83 mg/dL (ref 65–99)
GLUCOSE: 112 mg/dL — AB (ref 65–99)
GLUCOSE: 85 mg/dL (ref 65–99)
GLUCOSE: 149 mg/dL — AB (ref 65–99)
GLUCOSE: 283 mg/dL — AB (ref 65–99)
HCT: 26 % — ABNORMAL LOW (ref 36.0–46.0)
HCT: 23 % — ABNORMAL LOW (ref 36.0–46.0)
HCT: 26 % — ABNORMAL LOW (ref 36.0–46.0)
HCT: 26 % — ABNORMAL LOW (ref 36.0–46.0)
HCT: 24 % — ABNORMAL LOW (ref 36.0–46.0)
HCT: 31 % — ABNORMAL LOW (ref 36.0–46.0)
HEMOGLOBIN: 7.8 g/dL — AB (ref 12.0–15.0)
HEMOGLOBIN: 8.8 g/dL — AB (ref 12.0–15.0)
HEMOGLOBIN: 8.2 g/dL — AB (ref 12.0–15.0)
HEMOGLOBIN: 10.5 g/dL — AB (ref 12.0–15.0)
Hemoglobin: 8.8 g/dL — ABNORMAL LOW (ref 12.0–15.0)
Hemoglobin: 8.8 g/dL — ABNORMAL LOW (ref 12.0–15.0)
POTASSIUM: 3.9 mmol/L (ref 3.5–5.1)
Potassium: 3.8 mmol/L (ref 3.5–5.1)
Potassium: 4.1 mmol/L (ref 3.5–5.1)
Potassium: 4.8 mmol/L (ref 3.5–5.1)
Potassium: 4.3 mmol/L (ref 3.5–5.1)
Potassium: 4.1 mmol/L (ref 3.5–5.1)
SODIUM: 140 mmol/L (ref 135–145)
SODIUM: 138 mmol/L (ref 135–145)
SODIUM: 141 mmol/L (ref 135–145)
SODIUM: 142 mmol/L (ref 135–145)
Sodium: 140 mmol/L (ref 135–145)
Sodium: 140 mmol/L (ref 135–145)
TCO2: 26 mmol/L (ref 0–100)
TCO2: 27 mmol/L (ref 0–100)
TCO2: 25 mmol/L (ref 0–100)
TCO2: 25 mmol/L (ref 0–100)
TCO2: 23 mmol/L (ref 0–100)
TCO2: 20 mmol/L (ref 0–100)

## 2015-02-07 LAB — POCT I-STAT 3, ART BLOOD GAS (G3+)
ACID-BASE DEFICIT: 4 mmol/L — AB (ref 0.0–2.0)
ACID-BASE DEFICIT: 2 mmol/L (ref 0.0–2.0)
ACID-BASE DEFICIT: 9 mmol/L — AB (ref 0.0–2.0)
ACID-BASE DEFICIT: 6 mmol/L — AB (ref 0.0–2.0)
Acid-base deficit: 1 mmol/L (ref 0.0–2.0)
Bicarbonate: 24.8 mEq/L — ABNORMAL HIGH (ref 20.0–24.0)
Bicarbonate: 22.8 mEq/L (ref 20.0–24.0)
Bicarbonate: 21.1 mEq/L (ref 20.0–24.0)
Bicarbonate: 17.2 mEq/L — ABNORMAL LOW (ref 20.0–24.0)
Bicarbonate: 22.8 mEq/L (ref 20.0–24.0)
Bicarbonate: 20.4 mEq/L (ref 20.0–24.0)
Bicarbonate: 24.1 mEq/L — ABNORMAL HIGH (ref 20.0–24.0)
O2 SAT: 100 %
O2 SAT: 88 %
O2 SAT: 97 %
O2 Saturation: 100 %
O2 Saturation: 92 %
O2 Saturation: 94 %
O2 Saturation: 100 %
PCO2 ART: 38.8 mmHg (ref 35.0–45.0)
PCO2 ART: 34.3 mmHg — AB (ref 35.0–45.0)
PCO2 ART: 35.2 mmHg (ref 35.0–45.0)
PH ART: 7.432 (ref 7.350–7.450)
PH ART: 7.254 — AB (ref 7.350–7.450)
PH ART: 7.301 — AB (ref 7.350–7.450)
PH ART: 7.444 (ref 7.350–7.450)
PO2 ART: 102 mmHg — AB (ref 80.0–100.0)
Patient temperature: 34.2
Patient temperature: 37.1
TCO2: 26 mmol/L (ref 0–100)
TCO2: 24 mmol/L (ref 0–100)
TCO2: 22 mmol/L (ref 0–100)
TCO2: 18 mmol/L (ref 0–100)
TCO2: 24 mmol/L (ref 0–100)
TCO2: 22 mmol/L (ref 0–100)
TCO2: 25 mmol/L (ref 0–100)
pCO2 arterial: 31.5 mmHg — ABNORMAL LOW (ref 35.0–45.0)
pCO2 arterial: 38.8 mmHg (ref 35.0–45.0)
pCO2 arterial: 39.5 mmHg (ref 35.0–45.0)
pCO2 arterial: 41.4 mmHg (ref 35.0–45.0)
pH, Arterial: 7.413 (ref 7.350–7.450)
pH, Arterial: 7.421 (ref 7.350–7.450)
pH, Arterial: 7.369 (ref 7.350–7.450)
pO2, Arterial: 333 mmHg — ABNORMAL HIGH (ref 80.0–100.0)
pO2, Arterial: 257 mmHg — ABNORMAL HIGH (ref 80.0–100.0)
pO2, Arterial: 45 mmHg — ABNORMAL LOW (ref 80.0–100.0)
pO2, Arterial: 72 mmHg — ABNORMAL LOW (ref 80.0–100.0)
pO2, Arterial: 73 mmHg — ABNORMAL LOW (ref 80.0–100.0)
pO2, Arterial: 359 mmHg — ABNORMAL HIGH (ref 80.0–100.0)

## 2015-02-07 LAB — PREPARE RBC (CROSSMATCH)

## 2015-02-07 LAB — GLUCOSE, CAPILLARY
GLUCOSE-CAPILLARY: 80 mg/dL (ref 65–99)
Glucose-Capillary: 131 mg/dL — ABNORMAL HIGH (ref 65–99)
Glucose-Capillary: 130 mg/dL — ABNORMAL HIGH (ref 65–99)
Glucose-Capillary: 264 mg/dL — ABNORMAL HIGH (ref 65–99)
Glucose-Capillary: 228 mg/dL — ABNORMAL HIGH (ref 65–99)

## 2015-02-07 LAB — COMPREHENSIVE METABOLIC PANEL
ALT: 23 U/L (ref 14–54)
AST: 21 U/L (ref 15–41)
Albumin: 2.7 g/dL — ABNORMAL LOW (ref 3.5–5.0)
Alkaline Phosphatase: 50 U/L (ref 38–126)
Anion gap: 6 (ref 5–15)
BUN: 7 mg/dL (ref 6–20)
CO2: 26 mmol/L (ref 22–32)
Calcium: 8.7 mg/dL — ABNORMAL LOW (ref 8.9–10.3)
Chloride: 109 mmol/L (ref 101–111)
Creatinine, Ser: 0.66 mg/dL (ref 0.44–1.00)
GFR calc Af Amer: >60 mL/min (ref >60)
GFR calc non Af Amer: >60 mL/min (ref >60)
Glucose, Bld: 87 mg/dL (ref 65–99)
Potassium: 3.8 mmol/L (ref 3.5–5.1)
Sodium: 141 mmol/L (ref 135–145)
Total Bilirubin: 0.7 mg/dL (ref 0.3–1.2)
Total Protein: 5.5 g/dL — ABNORMAL LOW (ref 6.5–8.1)

## 2015-02-07 LAB — CBC
HCT: 28.2 % — ABNORMAL LOW (ref 36.0–46.0)
HEMATOCRIT: 36.5 % (ref 36.0–46.0)
HEMOGLOBIN: 12.7 g/dL (ref 12.0–15.0)
Hemoglobin: 9.3 g/dL — ABNORMAL LOW (ref 12.0–15.0)
MCH: 29.2 pg (ref 26.0–34.0)
MCH: 29.8 pg (ref 26.0–34.0)
MCHC: 33 g/dL (ref 30.0–36.0)
MCHC: 34.8 g/dL (ref 30.0–36.0)
MCV: 88.4 fL (ref 78.0–100.0)
MCV: 85.7 fL (ref 78.0–100.0)
PLATELETS: 141 10*3/uL — AB (ref 150–400)
Platelets: 113 10*3/uL — ABNORMAL LOW (ref 150–400)
RBC: 3.19 MIL/uL — ABNORMAL LOW (ref 3.87–5.11)
RBC: 4.26 MIL/uL (ref 3.87–5.11)
RDW: 14 % (ref 11.5–15.5)
RDW: 14.2 % (ref 11.5–15.5)
WBC: 6.7 10*3/uL (ref 4.0–10.5)
WBC: 13.6 10*3/uL — ABNORMAL HIGH (ref 4.0–10.5)

## 2015-02-07 LAB — CARBOXYHEMOGLOBIN
Carboxyhemoglobin: 0.8 % (ref 0.5–1.5)
Carboxyhemoglobin: 0.8 % (ref 0.5–1.5)
METHEMOGLOBIN: 0.8 % (ref 0.0–1.5)
METHEMOGLOBIN: 0.9 % (ref 0.0–1.5)
O2 SAT: 56.9 %
O2 Saturation: 44.5 %
TOTAL HEMOGLOBIN: 10.7 g/dL — AB (ref 12.0–16.0)
Total hemoglobin: 11.2 g/dL — ABNORMAL LOW (ref 12.0–16.0)

## 2015-02-07 LAB — HEPARIN LEVEL (UNFRACTIONATED): Heparin Unfractionated: 0.56 IU/mL (ref 0.30–0.70)

## 2015-02-07 LAB — HEMOGLOBIN AND HEMATOCRIT, BLOOD
HCT: 25.2 % — ABNORMAL LOW (ref 36.0–46.0)
Hemoglobin: 8.5 g/dL — ABNORMAL LOW (ref 12.0–15.0)

## 2015-02-07 LAB — POCT I-STAT GLUCOSE
GLUCOSE: 85 mg/dL (ref 65–99)
GLUCOSE: 83 mg/dL (ref 65–99)
OPERATOR ID: 3406
Operator id: 173792

## 2015-02-07 LAB — PROTIME-INR
INR: 1.6 — ABNORMAL HIGH (ref 0.00–1.49)
PROTHROMBIN TIME: 19.1 s — AB (ref 11.6–15.2)

## 2015-02-07 LAB — POCT I-STAT 4, (NA,K, GLUC, HGB,HCT)
GLUCOSE: 150 mg/dL — AB (ref 65–99)
HEMATOCRIT: 39 % (ref 36.0–46.0)
Hemoglobin: 13.3 g/dL (ref 12.0–15.0)
POTASSIUM: 3.8 mmol/L (ref 3.5–5.1)
SODIUM: 144 mmol/L (ref 135–145)

## 2015-02-07 LAB — APTT: aPTT: 38 seconds — ABNORMAL HIGH (ref 24–37)

## 2015-02-07 LAB — PLATELET COUNT: Platelets: 86 10*3/uL — ABNORMAL LOW (ref 150–400)

## 2015-02-07 SURGERY — CORONARY ARTERY BYPASS GRAFTING (CABG)
Anesthesia: General | Site: Chest

## 2015-02-07 SURGERY — IABP INSERTION

## 2015-02-07 MED ORDER — MAGNESIUM SULFATE 4 GM/100ML IV SOLN
4.0000 g | Freq: Once | INTRAVENOUS | Status: AC
  Administered 2015-02-07: 4 g via INTRAVENOUS
  Filled 2015-02-07: qty 100

## 2015-02-07 MED ORDER — PROPOFOL 10 MG/ML IV BOLUS
INTRAVENOUS | Status: AC
  Filled 2015-02-07: qty 20

## 2015-02-07 MED ORDER — ASPIRIN 81 MG PO CHEW
324.0000 mg | CHEWABLE_TABLET | Freq: Every day | ORAL | Status: DC
  Administered 2015-02-08: 324 mg
  Filled 2015-02-07: qty 4

## 2015-02-07 MED ORDER — ONDANSETRON HCL 4 MG/2ML IJ SOLN
4.0000 mg | Freq: Four times a day (QID) | INTRAMUSCULAR | Status: DC | PRN
  Administered 2015-02-09 – 2015-02-15 (×3): 4 mg via INTRAVENOUS
  Filled 2015-02-07 (×3): qty 2

## 2015-02-07 MED ORDER — LIDOCAINE HCL (CARDIAC) 20 MG/ML IV SOLN
INTRAVENOUS | Status: AC
  Filled 2015-02-07: qty 5

## 2015-02-07 MED ORDER — NOREPINEPHRINE BITARTRATE 1 MG/ML IV SOLN
4000.0000 ug | INTRAVENOUS | Status: DC | PRN
  Administered 2015-02-07: 5 ug/min via INTRAVENOUS

## 2015-02-07 MED ORDER — DOPAMINE-DEXTROSE 3.2-5 MG/ML-% IV SOLN: 3.0000 ug/kg/min | INTRAVENOUS | Status: DC

## 2015-02-07 MED ORDER — FENTANYL CITRATE (PF) 250 MCG/5ML IJ SOLN
INTRAMUSCULAR | Status: AC
  Filled 2015-02-07: qty 5

## 2015-02-07 MED ORDER — NOREPINEPHRINE BITARTRATE 1 MG/ML IV SOLN
0.0000 ug/min | INTRAVENOUS | Status: DC
  Administered 2015-02-07: 5 ug/min via INTRAVENOUS
  Administered 2015-02-08 (×2): 2 ug/min via INTRAVENOUS
  Administered 2015-02-10: 5.013 ug/min via INTRAVENOUS
  Administered 2015-02-12: 7 ug/min via INTRAVENOUS
  Filled 2015-02-07 (×4): qty 16

## 2015-02-07 MED ORDER — VANCOMYCIN HCL IN DEXTROSE 1-5 GM/200ML-% IV SOLN
1000.0000 mg | Freq: Once | INTRAVENOUS | Status: AC
  Administered 2015-02-07: 1000 mg via INTRAVENOUS
  Filled 2015-02-07: qty 200

## 2015-02-07 MED ORDER — ROCURONIUM BROMIDE 100 MG/10ML IV SOLN
INTRAVENOUS | Status: DC | PRN
  Administered 2015-02-07: 100 mg via INTRAVENOUS

## 2015-02-07 MED ORDER — ANTISEPTIC ORAL RINSE SOLUTION (CORINZ)
7.0000 mL | Freq: Four times a day (QID) | OROMUCOSAL | Status: DC
  Administered 2015-02-08 – 2015-02-09 (×7): 7 mL via OROMUCOSAL

## 2015-02-07 MED ORDER — HEPARIN (PORCINE) IN NACL 2-0.9 UNIT/ML-% IJ SOLN
INTRAMUSCULAR | Status: DC | PRN
  Administered 2015-02-07: 19:00:00

## 2015-02-07 MED ORDER — PROPOFOL 10 MG/ML IV BOLUS
INTRAVENOUS | Status: DC | PRN
  Administered 2015-02-07: 50 mg via INTRAVENOUS
  Administered 2015-02-07: 30 mg via INTRAVENOUS

## 2015-02-07 MED ORDER — METOCLOPRAMIDE HCL 5 MG/ML IJ SOLN
10.0000 mg | Freq: Four times a day (QID) | INTRAMUSCULAR | Status: AC
  Administered 2015-02-08 (×2): 10 mg via INTRAVENOUS
  Filled 2015-02-07 (×2): qty 2

## 2015-02-07 MED ORDER — PHENYLEPHRINE HCL 10 MG/ML IJ SOLN
INTRAMUSCULAR | Status: DC | PRN
  Administered 2015-02-07: 120 ug via INTRAVENOUS
  Administered 2015-02-07 (×2): 80 ug via INTRAVENOUS
  Administered 2015-02-07: 120 ug via INTRAVENOUS
  Administered 2015-02-07: 80 ug via INTRAVENOUS

## 2015-02-07 MED ORDER — ROCURONIUM BROMIDE 50 MG/5ML IV SOLN
INTRAVENOUS | Status: AC
  Filled 2015-02-07: qty 2

## 2015-02-07 MED ORDER — MORPHINE SULFATE (PF) 2 MG/ML IV SOLN: 1.0000 mg | INTRAVENOUS | Status: AC | PRN

## 2015-02-07 MED ORDER — ACETAMINOPHEN 500 MG PO TABS
1000.0000 mg | ORAL_TABLET | Freq: Four times a day (QID) | ORAL | Status: AC
  Administered 2015-02-08 – 2015-02-12 (×14): 1000 mg via ORAL
  Filled 2015-02-07 (×20): qty 2

## 2015-02-07 MED ORDER — CHLORHEXIDINE GLUCONATE 4 % EX LIQD
CUTANEOUS | Status: AC
  Administered 2015-02-07: 4 via TOPICAL
  Filled 2015-02-07: qty 15

## 2015-02-07 MED ORDER — ACETAMINOPHEN 160 MG/5ML PO SOLN
1000.0000 mg | Freq: Four times a day (QID) | ORAL | Status: AC
  Administered 2015-02-08 (×2): 1000 mg
  Filled 2015-02-07: qty 40.6

## 2015-02-07 MED ORDER — MILRINONE IN DEXTROSE 20 MG/100ML IV SOLN
0.1250 ug/kg/min | INTRAVENOUS | Status: DC
  Filled 2015-02-07: qty 100

## 2015-02-07 MED ORDER — BISACODYL 10 MG RE SUPP
10.0000 mg | Freq: Every day | RECTAL | Status: DC
  Administered 2015-02-08: 10 mg via RECTAL
  Filled 2015-02-07: qty 1

## 2015-02-07 MED ORDER — MILRINONE IN DEXTROSE 20 MG/100ML IV SOLN
INTRAVENOUS | Status: DC | PRN
  Administered 2015-02-07: .3 ug/kg/min via INTRAVENOUS

## 2015-02-07 MED ORDER — SODIUM CHLORIDE 0.9 % IJ SOLN
3.0000 mL | Freq: Two times a day (BID) | INTRAMUSCULAR | Status: DC
  Administered 2015-02-08: 10 mL via INTRAVENOUS
  Administered 2015-02-08: 3 mL via INTRAVENOUS
  Administered 2015-02-09: 10 mL via INTRAVENOUS
  Administered 2015-02-09: 3 mL via INTRAVENOUS
  Administered 2015-02-10: 10 mL via INTRAVENOUS
  Administered 2015-02-10 – 2015-02-14 (×5): 3 mL via INTRAVENOUS

## 2015-02-07 MED ORDER — TRAMADOL HCL 50 MG PO TABS
50.0000 mg | ORAL_TABLET | ORAL | Status: DC | PRN
  Administered 2015-02-08 – 2015-02-11 (×4): 100 mg via ORAL
  Administered 2015-02-15 – 2015-02-16 (×2): 50 mg via ORAL
  Filled 2015-02-07: qty 2
  Filled 2015-02-07 (×2): qty 1
  Filled 2015-02-07 (×3): qty 2

## 2015-02-07 MED ORDER — DOCUSATE SODIUM 100 MG PO CAPS
200.0000 mg | ORAL_CAPSULE | Freq: Every day | ORAL | Status: DC
  Administered 2015-02-09 – 2015-02-14 (×6): 200 mg via ORAL
  Filled 2015-02-07 (×6): qty 2

## 2015-02-07 MED ORDER — MILRINONE IN DEXTROSE 20 MG/100ML IV SOLN
0.3000 ug/kg/min | INTRAVENOUS | Status: DC
  Filled 2015-02-07: qty 100

## 2015-02-07 MED ORDER — METOPROLOL TARTRATE 12.5 MG HALF TABLET
12.5000 mg | ORAL_TABLET | Freq: Two times a day (BID) | ORAL | Status: DC
  Administered 2015-02-08: 12.5 mg via ORAL
  Filled 2015-02-07 (×5): qty 1

## 2015-02-07 MED ORDER — PROTAMINE SULFATE 10 MG/ML IV SOLN
INTRAVENOUS | Status: AC
  Filled 2015-02-07: qty 25

## 2015-02-07 MED ORDER — INSULIN REGULAR BOLUS VIA INFUSION
0.0000 [IU] | Freq: Three times a day (TID) | INTRAVENOUS | Status: DC
  Filled 2015-02-07: qty 10

## 2015-02-07 MED ORDER — SODIUM CHLORIDE 0.9 % IJ SOLN
INTRAMUSCULAR | Status: AC
  Filled 2015-02-07: qty 10

## 2015-02-07 MED ORDER — FAMOTIDINE IN NACL 20-0.9 MG/50ML-% IV SOLN
20.0000 mg | Freq: Two times a day (BID) | INTRAVENOUS | Status: AC
  Administered 2015-02-07 (×2): 20 mg via INTRAVENOUS
  Filled 2015-02-07: qty 50

## 2015-02-07 MED ORDER — LACTATED RINGERS IV SOLN: INTRAVENOUS | Status: DC

## 2015-02-07 MED ORDER — LACTATED RINGERS IV SOLN: 500.0000 mL | Freq: Once | INTRAVENOUS | Status: DC | PRN

## 2015-02-07 MED ORDER — NITROGLYCERIN IN D5W 200-5 MCG/ML-% IV SOLN: 0.0000 ug/min | INTRAVENOUS | Status: DC

## 2015-02-07 MED ORDER — MIDAZOLAM HCL 5 MG/5ML IJ SOLN
INTRAMUSCULAR | Status: DC | PRN
  Administered 2015-02-07: 3 mg via INTRAVENOUS
  Administered 2015-02-07: 4 mg via INTRAVENOUS
  Administered 2015-02-07: 1 mg via INTRAVENOUS

## 2015-02-07 MED ORDER — VECURONIUM BROMIDE 10 MG IV SOLR
INTRAVENOUS | Status: DC | PRN
  Administered 2015-02-07: 3 mg via INTRAVENOUS
  Administered 2015-02-07: 2 mg via INTRAVENOUS
  Administered 2015-02-07: 5 mg via INTRAVENOUS

## 2015-02-07 MED ORDER — ALBUMIN HUMAN 5 % IV SOLN
INTRAVENOUS | Status: DC | PRN
  Administered 2015-02-07 (×2): via INTRAVENOUS

## 2015-02-07 MED ORDER — HEPARIN SODIUM (PORCINE) 1000 UNIT/ML IJ SOLN
INTRAMUSCULAR | Status: DC | PRN
  Administered 2015-02-07: 19000 [IU] via INTRAVENOUS
  Administered 2015-02-07: 4000 [IU] via INTRAVENOUS
  Administered 2015-02-07: 2000 [IU] via INTRAVENOUS

## 2015-02-07 MED ORDER — SODIUM CHLORIDE 0.9 % IJ SOLN
INTRAMUSCULAR | Status: AC
  Filled 2015-02-07: qty 20

## 2015-02-07 MED ORDER — PROTAMINE SULFATE 10 MG/ML IV SOLN
INTRAVENOUS | Status: DC | PRN
  Administered 2015-02-07: 20 mg via INTRAVENOUS
  Administered 2015-02-07: 160 mg via INTRAVENOUS

## 2015-02-07 MED ORDER — POTASSIUM CHLORIDE 10 MEQ/50ML IV SOLN
10.0000 meq | INTRAVENOUS | Status: AC
  Administered 2015-02-07 (×2): 10 meq via INTRAVENOUS

## 2015-02-07 MED ORDER — PANTOPRAZOLE SODIUM 40 MG PO TBEC
40.0000 mg | DELAYED_RELEASE_TABLET | Freq: Every day | ORAL | Status: DC
  Administered 2015-02-09 – 2015-02-16 (×8): 40 mg via ORAL
  Filled 2015-02-07 (×8): qty 1

## 2015-02-07 MED ORDER — FENTANYL CITRATE (PF) 100 MCG/2ML IJ SOLN
INTRAMUSCULAR | Status: DC | PRN
  Administered 2015-02-07: 200 ug via INTRAVENOUS
  Administered 2015-02-07: 100 ug via INTRAVENOUS
  Administered 2015-02-07: 50 ug via INTRAVENOUS
  Administered 2015-02-07: 500 ug via INTRAVENOUS
  Administered 2015-02-07: 100 ug via INTRAVENOUS
  Administered 2015-02-07 (×2): 150 ug via INTRAVENOUS
  Administered 2015-02-07: 100 ug via INTRAVENOUS

## 2015-02-07 MED ORDER — ACETAMINOPHEN 650 MG RE SUPP
650.0000 mg | Freq: Once | RECTAL | Status: AC
  Administered 2015-02-07: 650 mg via RECTAL

## 2015-02-07 MED ORDER — DOPAMINE-DEXTROSE 1.6-5 MG/ML-% IV SOLN
INTRAVENOUS | Status: DC | PRN
  Administered 2015-02-07: 3 ug/kg/min via INTRAVENOUS

## 2015-02-07 MED ORDER — 0.9 % SODIUM CHLORIDE (POUR BTL) OPTIME
TOPICAL | Status: DC | PRN
  Administered 2015-02-07: 6000 mL

## 2015-02-07 MED ORDER — AMIODARONE HCL IN DEXTROSE 360-4.14 MG/200ML-% IV SOLN
30.0000 mg/h | INTRAVENOUS | Status: DC
Start: 2015-02-07 — End: 2015-02-11
  Administered 2015-02-07 – 2015-02-11 (×8): 30 mg/h via INTRAVENOUS
  Filled 2015-02-07 (×17): qty 200

## 2015-02-07 MED ORDER — CALCIUM CHLORIDE 10 % IV SOLN
INTRAVENOUS | Status: AC
  Filled 2015-02-07: qty 20

## 2015-02-07 MED ORDER — STERILE WATER FOR INJECTION IJ SOLN
INTRAMUSCULAR | Status: AC
  Filled 2015-02-07: qty 10

## 2015-02-07 MED ORDER — METOPROLOL TARTRATE 25 MG/10 ML ORAL SUSPENSION
12.5000 mg | Freq: Two times a day (BID) | ORAL | Status: DC
  Filled 2015-02-07 (×5): qty 5

## 2015-02-07 MED ORDER — HEPARIN (PORCINE) IN NACL 2-0.9 UNIT/ML-% IJ SOLN
INTRAMUSCULAR | Status: AC
  Filled 2015-02-07: qty 1000

## 2015-02-07 MED ORDER — ASPIRIN EC 325 MG PO TBEC
325.0000 mg | DELAYED_RELEASE_TABLET | Freq: Every day | ORAL | Status: DC
  Administered 2015-02-09 – 2015-02-12 (×4): 325 mg via ORAL
  Filled 2015-02-07 (×6): qty 1

## 2015-02-07 MED ORDER — ACETAMINOPHEN 160 MG/5ML PO SOLN: 650.0000 mg | Freq: Once | ORAL | Status: AC

## 2015-02-07 MED ORDER — SODIUM CHLORIDE 0.9 % IJ SOLN: 3.0000 mL | INTRAMUSCULAR | Status: DC | PRN

## 2015-02-07 MED ORDER — NOREPINEPHRINE BITARTRATE 1 MG/ML IV SOLN
0.0000 ug/min | INTRAVENOUS | Status: DC
  Filled 2015-02-07: qty 4

## 2015-02-07 MED ORDER — SODIUM CHLORIDE 0.45 % IV SOLN
INTRAVENOUS | Status: DC | PRN
  Administered 2015-02-09 – 2015-02-12 (×3): via INTRAVENOUS

## 2015-02-07 MED ORDER — LIDOCAINE HCL (PF) 1 % IJ SOLN
INTRAMUSCULAR | Status: AC
  Filled 2015-02-07: qty 5

## 2015-02-07 MED ORDER — DEXTROSE 5 % IV SOLN
1.5000 g | Freq: Two times a day (BID) | INTRAVENOUS | Status: AC
  Administered 2015-02-07 – 2015-02-09 (×4): 1.5 g via INTRAVENOUS
  Filled 2015-02-07 (×4): qty 1.5

## 2015-02-07 MED ORDER — PHENYLEPHRINE 40 MCG/ML (10ML) SYRINGE FOR IV PUSH (FOR BLOOD PRESSURE SUPPORT)
PREFILLED_SYRINGE | INTRAVENOUS | Status: AC
  Filled 2015-02-07: qty 10

## 2015-02-07 MED ORDER — MIDAZOLAM HCL 2 MG/2ML IJ SOLN
2.0000 mg | INTRAMUSCULAR | Status: DC | PRN
  Administered 2015-02-07: 2 mg via INTRAVENOUS
  Filled 2015-02-07 (×2): qty 2

## 2015-02-07 MED ORDER — SODIUM CHLORIDE 0.9 % IV SOLN: 250.0000 mL | INTRAVENOUS | Status: DC

## 2015-02-07 MED ORDER — MIDAZOLAM HCL 10 MG/2ML IJ SOLN
INTRAMUSCULAR | Status: AC
  Filled 2015-02-07: qty 4

## 2015-02-07 MED ORDER — HEMOSTATIC AGENTS (NO CHARGE) OPTIME
TOPICAL | Status: DC | PRN
  Administered 2015-02-07 (×5): 1 via TOPICAL

## 2015-02-07 MED ORDER — BISACODYL 5 MG PO TBEC
10.0000 mg | DELAYED_RELEASE_TABLET | Freq: Every day | ORAL | Status: DC
  Administered 2015-02-09 – 2015-02-14 (×4): 10 mg via ORAL
  Filled 2015-02-07 (×4): qty 2

## 2015-02-07 MED ORDER — DEXMEDETOMIDINE HCL IN NACL 200 MCG/50ML IV SOLN
0.0000 ug/kg/h | INTRAVENOUS | Status: DC
  Administered 2015-02-07: 0.7 ug/kg/h via INTRAVENOUS
  Administered 2015-02-08: 0.5 ug/kg/h via INTRAVENOUS
  Filled 2015-02-07 (×4): qty 50

## 2015-02-07 MED ORDER — OXYCODONE HCL 5 MG PO TABS
5.0000 mg | ORAL_TABLET | ORAL | Status: DC | PRN
  Administered 2015-02-08 (×3): 5 mg via ORAL
  Administered 2015-02-09: 10 mg via ORAL
  Administered 2015-02-09 (×5): 5 mg via ORAL
  Administered 2015-02-10 – 2015-02-12 (×8): 10 mg via ORAL
  Filled 2015-02-07 (×2): qty 2
  Filled 2015-02-07: qty 1
  Filled 2015-02-07 (×3): qty 2
  Filled 2015-02-07: qty 1
  Filled 2015-02-07 (×3): qty 2
  Filled 2015-02-07 (×4): qty 1
  Filled 2015-02-07: qty 2
  Filled 2015-02-07 (×2): qty 1

## 2015-02-07 MED ORDER — METHYLPREDNISOLONE SODIUM SUCC 125 MG IJ SOLR: 80.0000 mg | Freq: Two times a day (BID) | INTRAMUSCULAR | Status: DC

## 2015-02-07 MED ORDER — LACTATED RINGERS IV SOLN
INTRAVENOUS | Status: DC | PRN
  Administered 2015-02-07: 08:00:00 via INTRAVENOUS

## 2015-02-07 MED ORDER — EPINEPHRINE HCL 1 MG/ML IJ SOLN
0.5000 ug/min | INTRAVENOUS | Status: DC
  Administered 2015-02-08: 4 ug/min via INTRAVENOUS
  Administered 2015-02-09 (×2): 5 ug/min via INTRAVENOUS
  Administered 2015-02-10: 6 ug/min via INTRAVENOUS
  Administered 2015-02-10: 5 ug/min via INTRAVENOUS
  Administered 2015-02-11: 6 ug/min via INTRAVENOUS
  Filled 2015-02-07 (×8): qty 4

## 2015-02-07 MED ORDER — NITROGLYCERIN 0.2 MG/ML ON CALL CATH LAB
INTRAVENOUS | Status: DC | PRN
  Administered 2015-02-07: 80 ug via INTRAVENOUS
  Administered 2015-02-07: 40 ug via INTRAVENOUS

## 2015-02-07 MED ORDER — ALBUMIN HUMAN 5 % IV SOLN
250.0000 mL | INTRAVENOUS | Status: AC | PRN
  Administered 2015-02-07 (×3): 250 mL via INTRAVENOUS
  Filled 2015-02-07: qty 250

## 2015-02-07 MED ORDER — SODIUM CHLORIDE 0.9 % IV SOLN
INTRAVENOUS | Status: DC
  Administered 2015-02-07: 21:00:00 via INTRAVENOUS
  Administered 2015-02-08: 9.7 [IU]/h via INTRAVENOUS
  Administered 2015-02-09: 1.3 [IU]/h via INTRAVENOUS
  Administered 2015-02-09: 5.8 [IU]/h via INTRAVENOUS
  Administered 2015-02-10: 3.3 [IU]/h via INTRAVENOUS
  Filled 2015-02-07 (×5): qty 2.5

## 2015-02-07 MED ORDER — CALCIUM CHLORIDE 10 % IV SOLN
INTRAVENOUS | Status: DC | PRN
  Administered 2015-02-07: 100 mg via INTRAVENOUS
  Administered 2015-02-07: 200 mg via INTRAVENOUS

## 2015-02-07 MED ORDER — CHLORHEXIDINE GLUCONATE 0.12% ORAL RINSE (MEDLINE KIT)
15.0000 mL | Freq: Two times a day (BID) | OROMUCOSAL | Status: DC
  Administered 2015-02-07 – 2015-02-09 (×4): 15 mL via OROMUCOSAL

## 2015-02-07 MED ORDER — METHYLPREDNISOLONE SODIUM SUCC 125 MG IJ SOLR
80.0000 mg | Freq: Two times a day (BID) | INTRAMUSCULAR | Status: DC
  Administered 2015-02-07 – 2015-02-08 (×3): 80 mg via INTRAVENOUS
  Filled 2015-02-07: qty 2
  Filled 2015-02-07 (×2): qty 1.28

## 2015-02-07 MED ORDER — PHENYLEPHRINE HCL 10 MG/ML IJ SOLN
0.0000 ug/min | INTRAVENOUS | Status: DC
  Filled 2015-02-07: qty 2

## 2015-02-07 MED ORDER — SODIUM CHLORIDE 0.9 % IV SOLN
INTRAVENOUS | Status: DC
  Administered 2015-02-08 – 2015-02-12 (×3): via INTRAVENOUS

## 2015-02-07 MED ORDER — METOPROLOL TARTRATE 1 MG/ML IV SOLN: 2.5000 mg | INTRAVENOUS | Status: DC | PRN

## 2015-02-07 MED ORDER — MORPHINE SULFATE (PF) 2 MG/ML IV SOLN
2.0000 mg | INTRAVENOUS | Status: DC | PRN
  Administered 2015-02-08: 2 mg via INTRAVENOUS
  Administered 2015-02-08: 4 mg via INTRAVENOUS
  Administered 2015-02-08 – 2015-02-09 (×3): 2 mg via INTRAVENOUS
  Filled 2015-02-07: qty 2
  Filled 2015-02-07 (×4): qty 1

## 2015-02-07 MED ORDER — VECURONIUM BROMIDE 10 MG IV SOLR
INTRAVENOUS | Status: AC
  Filled 2015-02-07: qty 10

## 2015-02-07 MED FILL — Sodium Bicarbonate IV Soln 8.4%: INTRAVENOUS | Qty: 50 | Status: AC

## 2015-02-07 MED FILL — Mannitol IV Soln 20%: INTRAVENOUS | Qty: 500 | Status: AC

## 2015-02-07 MED FILL — Heparin Sodium (Porcine) Inj 1000 Unit/ML: INTRAMUSCULAR | Qty: 10 | Status: AC

## 2015-02-07 MED FILL — Lidocaine HCl IV Inj 20 MG/ML: INTRAVENOUS | Qty: 5 | Status: AC

## 2015-02-07 MED FILL — Sodium Chloride IV Soln 0.9%: INTRAVENOUS | Qty: 2000 | Status: AC

## 2015-02-07 MED FILL — Albumin, Human Inj 5%: INTRAVENOUS | Qty: 250 | Status: AC

## 2015-02-07 MED FILL — Electrolyte-R (PH 7.4) Solution: INTRAVENOUS | Qty: 5000 | Status: AC

## 2015-02-07 SURGICAL SUPPLY — 102 items
ADAPTER CARDIO PERF ANTE/RETRO (ADAPTER) ×4 IMPLANT
ADAPTER PERFUSION SWITCH (ADAPTER) ×4 IMPLANT
ADH SKN CLS APL DERMABOND .7 (GAUZE/BANDAGES/DRESSINGS) ×2
ADPR PRFSN 84XANTGRD RTRGD (ADAPTER) ×2
BAG DECANTER FOR FLEXI CONT (MISCELLANEOUS) ×4 IMPLANT
BANDAGE ELASTIC 4 VELCRO ST LF (GAUZE/BANDAGES/DRESSINGS) ×6 IMPLANT
BANDAGE ELASTIC 6 VELCRO ST LF (GAUZE/BANDAGES/DRESSINGS) ×6 IMPLANT
BASKET HEART  (ORDER IN 25'S) (MISCELLANEOUS) ×1
BASKET HEART (ORDER IN 25'S) (MISCELLANEOUS) ×1
BASKET HEART (ORDER IN 25S) (MISCELLANEOUS) ×2 IMPLANT
BLADE STERNUM SYSTEM 6 (BLADE) ×6 IMPLANT
BLADE SURG 11 STRL SS (BLADE) ×2 IMPLANT
BLADE SURG 12 STRL SS (BLADE) ×4 IMPLANT
BLADE SURG ROTATE 9660 (MISCELLANEOUS) IMPLANT
BNDG GAUZE ELAST 4 BULKY (GAUZE/BANDAGES/DRESSINGS) ×6 IMPLANT
CANISTER SUCTION 2500CC (MISCELLANEOUS) ×4 IMPLANT
CANNULA GUNDRY RCSP 15FR (MISCELLANEOUS) ×4 IMPLANT
CATH CPB KIT VANTRIGT (MISCELLANEOUS) ×4 IMPLANT
CATH ROBINSON RED A/P 18FR (CATHETERS) ×12 IMPLANT
CATH THORACIC 36FR RT ANG (CATHETERS) ×4 IMPLANT
COVER SURGICAL LIGHT HANDLE (MISCELLANEOUS) ×4 IMPLANT
CRADLE DONUT ADULT HEAD (MISCELLANEOUS) ×4 IMPLANT
DERMABOND ADVANCED (GAUZE/BANDAGES/DRESSINGS) ×2
DERMABOND ADVANCED .7 DNX12 (GAUZE/BANDAGES/DRESSINGS) IMPLANT
DRAIN CHANNEL 32F RND 10.7 FF (WOUND CARE) ×4 IMPLANT
DRAPE CARDIOVASCULAR INCISE (DRAPES) ×4
DRAPE SLUSH/WARMER DISC (DRAPES) ×4 IMPLANT
DRAPE SRG 135X102X78XABS (DRAPES) ×2 IMPLANT
DRSG AQUACEL AG ADV 3.5X14 (GAUZE/BANDAGES/DRESSINGS) ×4 IMPLANT
ELECT BLADE 4.0 EZ CLEAN MEGAD (MISCELLANEOUS) ×8
ELECT BLADE 6.5 EXT (BLADE) ×4 IMPLANT
ELECT CAUTERY BLADE 6.4 (BLADE) ×4 IMPLANT
ELECT REM PT RETURN 9FT ADLT (ELECTROSURGICAL) ×8
ELECTRODE BLDE 4.0 EZ CLN MEGD (MISCELLANEOUS) ×2 IMPLANT
ELECTRODE REM PT RTRN 9FT ADLT (ELECTROSURGICAL) ×4 IMPLANT
GAUZE SPONGE 4X4 12PLY STRL (GAUZE/BANDAGES/DRESSINGS) ×8 IMPLANT
GLOVE BIO SURGEON STRL SZ7.5 (GLOVE) ×12 IMPLANT
GLOVE BIOGEL M 6.5 STRL (GLOVE) ×8 IMPLANT
GLOVE BIOGEL M STER SZ 6 (GLOVE) ×8 IMPLANT
GLOVE BIOGEL PI IND STRL 6.5 (GLOVE) IMPLANT
GLOVE BIOGEL PI INDICATOR 6.5 (GLOVE) ×8
GOWN STRL REUS W/ TWL LRG LVL3 (GOWN DISPOSABLE) ×8 IMPLANT
GOWN STRL REUS W/TWL LRG LVL3 (GOWN DISPOSABLE) ×40
HEMOSTAT POWDER SURGIFOAM 1G (HEMOSTASIS) ×12 IMPLANT
HEMOSTAT SURGICEL 2X14 (HEMOSTASIS) ×4 IMPLANT
KIT BASIN OR (CUSTOM PROCEDURE TRAY) ×4 IMPLANT
KIT ROOM TURNOVER OR (KITS) ×4 IMPLANT
KIT SUCTION CATH 14FR (SUCTIONS) ×4 IMPLANT
KIT VASOVIEW W/TROCAR VH 2000 (KITS) ×4 IMPLANT
LEAD PACING MYOCARDI (MISCELLANEOUS) ×6 IMPLANT
MARKER GRAFT CORONARY BYPASS (MISCELLANEOUS) ×10 IMPLANT
NS IRRIG 1000ML POUR BTL (IV SOLUTION) ×22 IMPLANT
PACK OPEN HEART (CUSTOM PROCEDURE TRAY) ×4 IMPLANT
PAD ARMBOARD 7.5X6 YLW CONV (MISCELLANEOUS) ×8 IMPLANT
PAD ELECT DEFIB RADIOL ZOLL (MISCELLANEOUS) ×4 IMPLANT
PENCIL BUTTON HOLSTER BLD 10FT (ELECTRODE) ×6 IMPLANT
PUNCH AORTIC ROTATE 4.0MM (MISCELLANEOUS) IMPLANT
SET CARDIOPLEGIA MPS 5001102 (MISCELLANEOUS) ×2 IMPLANT
SOLUTION ANTI FOG 6CC (MISCELLANEOUS) ×2 IMPLANT
SPONGE GAUZE 4X4 12PLY STER LF (GAUZE/BANDAGES/DRESSINGS) ×4 IMPLANT
SPONGE LAP 18X18 X RAY DECT (DISPOSABLE) ×12 IMPLANT
SURGIFLO W/THROMBIN 8M KIT (HEMOSTASIS) ×4 IMPLANT
SUT BONE WAX W31G (SUTURE) ×4 IMPLANT
SUT ETHIBOND 2 0 SH (SUTURE) ×16
SUT ETHIBOND 2 0 SH 36X2 (SUTURE) IMPLANT
SUT ETHILON 3 0 FSL (SUTURE) ×4 IMPLANT
SUT MNCRL AB 4-0 PS2 18 (SUTURE) ×4 IMPLANT
SUT PROLENE 4 0 RB 1 (SUTURE) ×12
SUT PROLENE 4 0 SH DA (SUTURE) ×4 IMPLANT
SUT PROLENE 4-0 RB1 .5 CRCL 36 (SUTURE) ×2 IMPLANT
SUT PROLENE 5 0 C 1 36 (SUTURE) ×4 IMPLANT
SUT PROLENE 6 0 C 1 30 (SUTURE) ×6 IMPLANT
SUT PROLENE 6 0 CC (SUTURE) ×18 IMPLANT
SUT PROLENE 8 0 BV175 6 (SUTURE) ×20 IMPLANT
SUT PROLENE BLUE 7 0 (SUTURE) ×4 IMPLANT
SUT SILK  1 MH (SUTURE) ×8
SUT SILK 1 MH (SUTURE) IMPLANT
SUT SILK 1 TIES 10X30 (SUTURE) ×2 IMPLANT
SUT SILK 2 0 SH CR/8 (SUTURE) ×6 IMPLANT
SUT SILK 2 0 TIES 10X30 (SUTURE) ×2 IMPLANT
SUT SILK 2 0 TIES 17X18 (SUTURE) ×4
SUT SILK 2-0 18XBRD TIE BLK (SUTURE) IMPLANT
SUT SILK 3 0 SH CR/8 (SUTURE) ×2 IMPLANT
SUT SILK 4 0 TIE 10X30 (SUTURE) ×4 IMPLANT
SUT STEEL 6MS V (SUTURE) ×10 IMPLANT
SUT STEEL SZ 6 DBL 3X14 BALL (SUTURE) ×4 IMPLANT
SUT TEM PAC WIRE 2 0 SH (SUTURE) ×8 IMPLANT
SUT VIC AB 1 CTX 36 (SUTURE) ×12
SUT VIC AB 1 CTX36XBRD ANBCTR (SUTURE) ×4 IMPLANT
SUT VIC AB 2-0 CT1 27 (SUTURE) ×8
SUT VIC AB 2-0 CT1 TAPERPNT 27 (SUTURE) IMPLANT
SUT VIC AB 2-0 CTX 27 (SUTURE) ×4 IMPLANT
SUT VIC AB 3-0 X1 27 (SUTURE) ×4 IMPLANT
SWITCH PERFUSION CARDIOPLEGIA (ADAPTER) IMPLANT
SYSTEM SAHARA CHEST DRAIN ATS (WOUND CARE) ×4 IMPLANT
TAPE CLOTH SURG 4X10 WHT LF (GAUZE/BANDAGES/DRESSINGS) ×4 IMPLANT
TOWEL OR 17X24 6PK STRL BLUE (TOWEL DISPOSABLE) ×8 IMPLANT
TOWEL OR 17X26 10 PK STRL BLUE (TOWEL DISPOSABLE) ×8 IMPLANT
TRAY FOLEY IC TEMP SENS 16FR (CATHETERS) ×4 IMPLANT
TUBING INSUFFLATION (TUBING) ×4 IMPLANT
UNDERPAD 30X30 INCONTINENT (UNDERPADS AND DIAPERS) ×4 IMPLANT
WATER STERILE IRR 1000ML POUR (IV SOLUTION) ×6 IMPLANT

## 2015-02-07 SURGICAL SUPPLY — 5 items
BALLN LINEAR 7.5FR IABP 34CC (BALLOONS) ×3
BALLOON LINEAR 7.5FR IABP 34CC (BALLOONS) IMPLANT
DEVICE SECURE STATLOCK IABP (MISCELLANEOUS) ×4 IMPLANT
PACK CARDIAC CATHETERIZATION (CUSTOM PROCEDURE TRAY) ×2 IMPLANT
SHEATH PINNACLE 5F 10CM (SHEATH) ×2 IMPLANT

## 2015-02-07 NOTE — Progress Notes (Signed)
The patient was examined and preop studies reviewed. There has been no change from the prior exam and the patient is ready for surgery. Plan CABG on K [place]

## 2015-02-07 NOTE — Transfer of Care (Signed)
Immediate Anesthesia Transfer of Care Note  Patient: [name]  Procedure(s) Performed: Procedure(s): CORONARY ARTERY BYPASS GRAFTING (CABG) (N/A) TRANSESOPHAGEAL ECHOCARDIOGRAM (TEE) (N/A)  Patient Location: SICU  Anesthesia Type:General  Level of Consciousness: Patient remains intubated per anesthesia plan  Airway & Oxygen Therapy: Patient remains intubated per anesthesia plan and Patient placed on Ventilator (see vital sign flow sheet for setting)  Post-op Assessment: Report given to RN  Post vital signs: Reviewed and stable  Last Vitals:  Filed Vitals:   02/07/15 0410  BP:   Pulse:   Temp: 36.9 C  Resp:     Complications: No apparent anesthesia complications

## 2015-02-07 NOTE — Progress Notes (Signed)
Pt bp began to drop in spite of titration of current drips. Extra staff called into room and shortly thereafter code called. Modena Jansky Rn

## 2015-02-07 NOTE — Progress Notes (Signed)
Patient ID: KEYSHLA TUNISON, female   DOB: 13-Jul-1970, 44 y.o.   MRN: [id number] EVENING ROUNDS NOTE :     [address]       [place] [address]             [phone]                 Day of Surgery Procedure(s) (LRB): IABP Insertion (N/A)  Total Length of Stay:  LOS: 6 days  BP 87/65 mmHg  Pulse 106  Temp(Src) 99 F (37.2 C) (Oral)  Resp 14  Ht  (1.6 m)  Wt 180 lb 5.4 oz (81.8 kg)  BMI 31.95 kg/m2  SpO2 100%  .Intake/Output      09/29 0701 - 09/30 0700   P.O.    I.V. (mL/kg) 3670.7 (44.9)   Blood 1052   IV Piggyback 1020   Total Intake(mL/kg) 5742.7 (70.2)   Urine (mL/kg/hr) 2340 (2.1)   Emesis/NG output 10 (0)   Blood 1680 (1.5)   Chest Tube 110 (0.1)   Total Output 4140   Net +1602.7         . sodium chloride    . [START ON 02/08/2015] sodium chloride    . sodium chloride 20 mL/hr at 02/07/15 1700  . amiodarone 30 mg/hr (02/07/15 1700)  . dexmedetomidine 0.7 mcg/kg/hr (02/07/15 1900)  . DOPamine 0.978 mcg/kg/min (02/07/15 1700)  . epinephrine    . insulin (NOVOLIN-R) infusion 6.7 Units/hr (02/07/15 2023)  . lactated ringers 20 mL/hr at 02/07/15 1700  . lactated ringers 20 mL/hr at 02/07/15 1700  . milrinone 0.3 mcg/kg/min (02/07/15 1700)  . nitroGLYCERIN Stopped (02/07/15 1520)  . norepinephrine (LEVOPHED) Adult infusion    . phenylephrine (NEO-SYNEPHRINE) Adult infusion 100 mcg/min (02/07/15 1725)     Lab Results  Component Value Date   WBC 13.6* 02/07/2015   HGB 10.5* 02/07/2015   HCT 31.0* 02/07/2015   PLT 113* 02/07/2015   GLUCOSE 283* 02/07/2015   CHOL 96 02/03/2015   TRIG 88 02/03/2015   HDL 32* 02/03/2015   LDLCALC 46 02/03/2015   ALT 23 02/07/2015   AST 21 02/07/2015   NA 142 02/07/2015   K 4.1 02/07/2015   CL 107 02/07/2015   CREATININE 0.60 02/07/2015   BUN 6 02/07/2015   CO2 26 02/07/2015   TSH 2.674 10/28/2009   INR 1.60* 02/07/2015   HGBA1C 11.7* 02/03/2015   MICROALBUR 0.50 10/28/2009   Called to  code , intially stable postop, developed profound hypotension and  Required cpr and shock , ekg no accute changes. Echo- no tamponade inferior hypokensis as preop. Required several boluses of epi with elevated bp then hypotension, stablized with IAB placement in left groin position good on chest xray. abg  Stabilized. Continue support with vent drips and IAB 1:1   Delight Ovens MD  Beeper [phone] Office [phone] 02/07/2015 8:29 PM

## 2015-02-07 NOTE — OR Nursing (Signed)
RN contacted SICU, providing patient update.

## 2015-02-07 NOTE — OR Nursing (Signed)
1st call made to SICU, providing a patient update. 

## 2015-02-07 NOTE — Progress Notes (Signed)
Initiated Open Heart Rapid Wean Protocol per policy. RT will continue to monitor.  

## 2015-02-07 NOTE — Progress Notes (Signed)
RN called RT to room, upon entering, pt placed back on FS and pt found to be PEA. ACLS initiated. Pt taken off ventilator and manually ventilated throughout code. Pt with ROSC and placed back on ventilator. Increased FiO2 to 60% per Dr Lowella Fairy. RT will continue to monitor.

## 2015-02-07 NOTE — Anesthesia Procedure Notes (Addendum)
Anesthesia Procedure Note PA catheter:  Routine monitors. Timeout, sterile prep, drape, FBP R neck.  Trendelenburg position.  1% Lido local, finder and trocar RIJ 1st pass with US guidance.  Cordis placed over J wire. PA catheter in easily.  Sterile dressing applied.  Patient tolerated well, VSS.  Jenita Seashore, MD [phone] Procedure Name: Intubation Date/Time: 02/07/2015 7:57 AM Performed by: Izora Gala Pre-anesthesia Checklist: Patient identified, Emergency Drugs available, Suction available, Patient being monitored and Timeout performed Patient Re-evaluated:Patient Re-evaluated prior to inductionOxygen Delivery Method: Circle system utilized Preoxygenation: Pre-oxygenation with 100% oxygen Intubation Type: IV induction Ventilation: Mask ventilation with difficulty Laryngoscope Size: Miller and 3 Grade View: Grade I Tube type: Oral Tube size: 7.5 mm Number of attempts: 1 Airway Equipment and Method: Stylet Placement Confirmation: ETT inserted through vocal cords under direct vision,  positive ETCO2 and breath sounds checked- equal and bilateral Secured at: 22 cm Tube secured with: Tape Dental Injury: Teeth and Oropharynx as per pre-operative assessment  Comments: Intubation by Joylene Igo, SRNA

## 2015-02-07 NOTE — Brief Op Note (Signed)
02/01/2015 - 02/07/2015  12:15 PM  PATIENT:  [name]  44 y.o. female  PRE-OPERATIVE DIAGNOSIS:  CAD  POST-OPERATIVE DIAGNOSIS:  CAD  PROCEDURE:  Procedure(s):  CORONARY ARTERY BYPASS GRAFTING  X 3 -LIMA to LAD -SVG to OM -SVG to PDA  ENDOSCOPIC HARVEST GREATER SAPHENOUS VEIN  -Left Leg - Right knee explored, vein tracked and found to be intramuscular-- not harvested  TRANSESOPHAGEAL ECHOCARDIOGRAM (TEE) (N/A)  SURGEON:  Surgeon(s) and Role:    * Kerin Perna, MD - Primary  PHYSICIAN ASSISTANT: Lowella Dandy PA-C  ANESTHESIA:   general  EBL:  Total I/O In: 70 [IV Piggyback:70] Out: 650 [Urine:650]  BLOOD ADMINISTERED: 2U PRBC, CELLSAVER, FFP and  PLTS  DRAINS: Left Pleural Chest Tube, Mediastinal Chest drains   LOCAL MEDICATIONS USED:  NONE  SPECIMEN:  No Specimen  DISPOSITION OF SPECIMEN:  N/A  COUNTS:  YES  TOURNIQUET:  * No tourniquets in log *  DICTATION: .Dragon Dictation  PLAN OF CARE: Admit to inpatient   PATIENT DISPOSITION:  ICU - intubated and hemodynamically stable.   Delay start of Pharmacological VTE agent (>24hrs) due to surgical blood loss or risk of bleeding: yes

## 2015-02-07 NOTE — Anesthesia Postprocedure Evaluation (Signed)
  Anesthesia Post-op Note  Patient: [name]  Procedure(s) Performed: Procedure(s): CORONARY ARTERY BYPASS GRAFTING (CABG) (N/A) TRANSESOPHAGEAL ECHOCARDIOGRAM (TEE) (N/A)  Patient Location: ICU  Anesthesia Type:General  Level of Consciousness: sedated and unresponsive  Airway and Oxygen Therapy: Patient remains intubated and on ventilator  Post-op Pain: none  Post-op Assessment: Post-op Vital signs reviewed, Patient's Cardiovascular Status Stable and Respiratory Function Stable  Post-op Vital Signs: Reviewed  Filed Vitals:   02/07/15 1415  BP: 102/65  Pulse: 109  Temp:   Resp: 12    Complications: No apparent anesthesia complications

## 2015-02-07 NOTE — Anesthesia Preprocedure Evaluation (Addendum)
Anesthesia Evaluation  Patient identified by MRN, date of birth, ID band Patient awake    Reviewed: Allergy & Precautions, H&P , NPO status , Patient's Chart, lab work & pertinent test results, reviewed documented beta blocker date and time   Airway Mallampati: III  TM Distance: >3 FB Neck ROM: Full    Dental no notable dental hx. (+) Partial Lower, Partial Upper, Dental Advisory Given   Pulmonary neg pulmonary ROS, former smoker,    Pulmonary exam normal breath sounds clear to auscultation       Cardiovascular hypertension, Pt. on medications and Pt. on home beta blockers + angina + CAD, + Past MI and + Peripheral Vascular Disease   Rhythm:Regular Rate:Normal     Neuro/Psych negative neurological ROS  negative psych ROS   GI/Hepatic negative GI ROS, Neg liver ROS,   Endo/Other  diabetes, Type 1, Insulin Dependent  Renal/GU negative Renal ROS  negative genitourinary   Musculoskeletal   Abdominal   Peds  Hematology negative hematology ROS (+)   Anesthesia Other Findings   Reproductive/Obstetrics negative OB ROS                            Anesthesia Physical Anesthesia Plan  ASA: IV  Anesthesia Plan: General   Post-op Pain Management:    Induction: Intravenous  Airway Management Planned: Oral ETT  Additional Equipment: Arterial line, CVP, PA Cath, TEE and Ultrasound Guidance Line Placement  Intra-op Plan:   Post-operative Plan: Post-operative intubation/ventilation  Informed Consent: I have reviewed the patients History and Physical, chart, labs and discussed the procedure including the risks, benefits and alternatives for the proposed anesthesia with the patient or authorized representative who has indicated his/her understanding and acceptance.   Dental advisory given  Plan Discussed with: CRNA  Anesthesia Plan Comments:         Anesthesia Quick Evaluation

## 2015-02-07 NOTE — Progress Notes (Signed)
  Echocardiogram Echocardiogram Transesophageal has been performed.  [name] 02/07/2015, 8:45 AM 

## 2015-02-07 NOTE — Progress Notes (Signed)
  20:00-CO/CI 2.4/1.3, PADs 21 Gerhardt at bedside, wants remaining (4th) albumin given. Levophed at , Epi at 3. UOP 30-60/hr. IABP placed at bedside, 1:1. Pt attached to bedside zoll, code cart in room.   2100-CI now 1.1, MD Bensimhon notified- wants co-ox (results 56.9) does not want milrinone restarted. Repeat co-ox at midnight. MD Bensimhon thinks swan thermo sensor is not accurate. UOP 30. Will continue to monitor.  0000-Repeat co-ox 44.5, UOP 35ml/hr, CO-1.79, CI-0.97. MD Bensimhon/Gerhardt notified. Milrinone restarted at 0.48mcg/kg/min.   0300-MD Gerhardt called about UOP <3ml/hr. Order for Lasix. Cardiac index slowly increasing, currently 1.37. Will cont to monitor.   0500-Great response to lasix, >400 mls this current hour and approx 2L UOP this shift.   SMITH, MEREDITH L

## 2015-02-07 NOTE — Progress Notes (Signed)
  Responded to Code [employer].   On my arrival patient undergoing active CPR for apparent PEA arrest. Then developed VT and defibrillated x 1.  I performed bedside echo EF 45%. Inferior HK. No effusion. Dr. Tyrone Sage at bedside.   Patient responded well to IV epi push but then developed recurrent PEA arrest and CPR resumed. Resuscitated with epi. Epi drip started. Bicarb also given. CXR without effusion or pulmonary edema. Again deteriorated with PEA arrest and CPR resumed and resuscitated with epi.   Case discussed with Dr. Donata Clay and Dr. Tyrone Sage and decision made to place emergent IABP at bedside. (se procedure note). IABP placed successfully and position adjusted with CXR.   Family updated at bedside. Total CCT 90 minutes not including IABP insertion time.   Bensimhon, Daniel,MD 7:25 PM

## 2015-02-08 ENCOUNTER — Encounter (HOSPITAL_COMMUNITY): Payer: Self-pay | Admitting: Internal Medicine

## 2015-02-08 ENCOUNTER — Inpatient Hospital Stay (HOSPITAL_COMMUNITY): Payer: Medicaid Other

## 2015-02-08 DIAGNOSIS — Z951 Presence of aortocoronary bypass graft: Secondary | ICD-10-CM

## 2015-02-08 LAB — COMPREHENSIVE METABOLIC PANEL
ALT: 361 U/L — ABNORMAL HIGH (ref 14–54)
AST: 487 U/L — ABNORMAL HIGH (ref 15–41)
Albumin: 3.7 g/dL (ref 3.5–5.0)
Alkaline Phosphatase: 57 U/L (ref 38–126)
Anion gap: 9 (ref 5–15)
BUN: 9 mg/dL (ref 6–20)
CO2: 21 mmol/L — ABNORMAL LOW (ref 22–32)
Calcium: 7.6 mg/dL — ABNORMAL LOW (ref 8.9–10.3)
Chloride: 109 mmol/L (ref 101–111)
Creatinine, Ser: 1.01 mg/dL — ABNORMAL HIGH (ref 0.44–1.00)
GFR calc Af Amer: >60 mL/min (ref >60)
GFR calc non Af Amer: >60 mL/min (ref >60)
Glucose, Bld: 214 mg/dL — ABNORMAL HIGH (ref 65–99)
Potassium: 3.2 mmol/L — ABNORMAL LOW (ref 3.5–5.1)
Sodium: 139 mmol/L (ref 135–145)
Total Bilirubin: 1.5 mg/dL — ABNORMAL HIGH (ref 0.3–1.2)
Total Protein: 5 g/dL — ABNORMAL LOW (ref 6.5–8.1)

## 2015-02-08 LAB — POCT I-STAT 3, ART BLOOD GAS (G3+)
ACID-BASE DEFICIT: 3 mmol/L — AB (ref 0.0–2.0)
ACID-BASE DEFICIT: 3 mmol/L — AB (ref 0.0–2.0)
ACID-BASE DEFICIT: 6 mmol/L — AB (ref 0.0–2.0)
Acid-base deficit: 2 mmol/L (ref 0.0–2.0)
Acid-base deficit: 4 mmol/L — ABNORMAL HIGH (ref 0.0–2.0)
BICARBONATE: 19.1 meq/L — AB (ref 20.0–24.0)
BICARBONATE: 21.2 meq/L (ref 20.0–24.0)
BICARBONATE: 21.3 meq/L (ref 20.0–24.0)
BICARBONATE: 21.6 meq/L (ref 20.0–24.0)
Bicarbonate: 20.4 mEq/L (ref 20.0–24.0)
O2 SAT: 96 %
O2 SAT: 96 %
O2 Saturation: 100 %
O2 Saturation: 99 %
O2 Saturation: 96 %
PCO2 ART: 32.3 mmHg — AB (ref 35.0–45.0)
PCO2 ART: 35.2 mmHg (ref 35.0–45.0)
PCO2 ART: 37.5 mmHg (ref 35.0–45.0)
PH ART: 7.356 (ref 7.350–7.450)
PH ART: 7.389 (ref 7.350–7.450)
PH ART: 7.414 (ref 7.350–7.450)
PH ART: 7.434 (ref 7.350–7.450)
PO2 ART: 210 mmHg — AB (ref 80.0–100.0)
PO2 ART: 81 mmHg (ref 80.0–100.0)
PO2 ART: 82 mmHg (ref 80.0–100.0)
PO2 ART: 84 mmHg (ref 80.0–100.0)
Patient temperature: 37.3
Patient temperature: 36.7
Patient temperature: 36.1
TCO2: 20 mmol/L (ref 0–100)
TCO2: 21 mmol/L (ref 0–100)
TCO2: 22 mmol/L (ref 0–100)
TCO2: 22 mmol/L (ref 0–100)
TCO2: 23 mmol/L (ref 0–100)
pCO2 arterial: 31.9 mmHg — ABNORMAL LOW (ref 35.0–45.0)
pCO2 arterial: 34.5 mmHg — ABNORMAL LOW (ref 35.0–45.0)
pH, Arterial: 7.35 (ref 7.350–7.450)
pO2, Arterial: 141 mmHg — ABNORMAL HIGH (ref 80.0–100.0)

## 2015-02-08 LAB — CBC
HEMATOCRIT: 35.5 % — AB (ref 36.0–46.0)
HEMATOCRIT: 28 % — AB (ref 36.0–46.0)
HEMOGLOBIN: 12.2 g/dL (ref 12.0–15.0)
HEMOGLOBIN: 9.4 g/dL — AB (ref 12.0–15.0)
MCH: 29 pg (ref 26.0–34.0)
MCH: 28.7 pg (ref 26.0–34.0)
MCHC: 34.4 g/dL (ref 30.0–36.0)
MCHC: 33.6 g/dL (ref 30.0–36.0)
MCV: 84.5 fL (ref 78.0–100.0)
MCV: 85.6 fL (ref 78.0–100.0)
Platelets: 144 10*3/uL — ABNORMAL LOW (ref 150–400)
Platelets: 99 10*3/uL — ABNORMAL LOW (ref 150–400)
RBC: 4.2 MIL/uL (ref 3.87–5.11)
RBC: 3.27 MIL/uL — AB (ref 3.87–5.11)
RDW: 14.5 % (ref 11.5–15.5)
RDW: 14.8 % (ref 11.5–15.5)
WBC: 15.6 10*3/uL — AB (ref 4.0–10.5)
WBC: 20.4 10*3/uL — AB (ref 4.0–10.5)

## 2015-02-08 LAB — GLUCOSE, CAPILLARY
GLUCOSE-CAPILLARY: 130 mg/dL — AB (ref 65–99)
GLUCOSE-CAPILLARY: 136 mg/dL — AB (ref 65–99)
GLUCOSE-CAPILLARY: 139 mg/dL — AB (ref 65–99)
GLUCOSE-CAPILLARY: 142 mg/dL — AB (ref 65–99)
GLUCOSE-CAPILLARY: 142 mg/dL — AB (ref 65–99)
GLUCOSE-CAPILLARY: 152 mg/dL — AB (ref 65–99)
GLUCOSE-CAPILLARY: 166 mg/dL — AB (ref 65–99)
GLUCOSE-CAPILLARY: 166 mg/dL — AB (ref 65–99)
GLUCOSE-CAPILLARY: 169 mg/dL — AB (ref 65–99)
GLUCOSE-CAPILLARY: 179 mg/dL — AB (ref 65–99)
GLUCOSE-CAPILLARY: 179 mg/dL — AB (ref 65–99)
GLUCOSE-CAPILLARY: 183 mg/dL — AB (ref 65–99)
GLUCOSE-CAPILLARY: 217 mg/dL — AB (ref 65–99)
Glucose-Capillary: 252 mg/dL — ABNORMAL HIGH (ref 65–99)
Glucose-Capillary: 188 mg/dL — ABNORMAL HIGH (ref 65–99)
Glucose-Capillary: 185 mg/dL — ABNORMAL HIGH (ref 65–99)
Glucose-Capillary: 188 mg/dL — ABNORMAL HIGH (ref 65–99)
Glucose-Capillary: 168 mg/dL — ABNORMAL HIGH (ref 65–99)
Glucose-Capillary: 184 mg/dL — ABNORMAL HIGH (ref 65–99)
Glucose-Capillary: 177 mg/dL — ABNORMAL HIGH (ref 65–99)
Glucose-Capillary: 154 mg/dL — ABNORMAL HIGH (ref 65–99)
Glucose-Capillary: 153 mg/dL — ABNORMAL HIGH (ref 65–99)
Glucose-Capillary: 129 mg/dL — ABNORMAL HIGH (ref 65–99)
Glucose-Capillary: 135 mg/dL — ABNORMAL HIGH (ref 65–99)

## 2015-02-08 LAB — PREPARE PLATELET PHERESIS: Unit division: 0

## 2015-02-08 LAB — PREPARE FRESH FROZEN PLASMA: UNIT DIVISION: 0

## 2015-02-08 LAB — POCT I-STAT, CHEM 8
BUN: 13 mg/dL (ref 6–20)
CREATININE: 0.8 mg/dL (ref 0.44–1.00)
Calcium, Ion: 1.07 mmol/L — ABNORMAL LOW (ref 1.12–1.23)
Chloride: 105 mmol/L (ref 101–111)
GLUCOSE: 147 mg/dL — AB (ref 65–99)
HEMATOCRIT: 32 % — AB (ref 36.0–46.0)
HEMOGLOBIN: 10.9 g/dL — AB (ref 12.0–15.0)
Potassium: 3.8 mmol/L (ref 3.5–5.1)
Sodium: 141 mmol/L (ref 135–145)
TCO2: 18 mmol/L (ref 0–100)

## 2015-02-08 LAB — MAGNESIUM
MAGNESIUM: 2.1 mg/dL (ref 1.7–2.4)
Magnesium: 1.9 mg/dL (ref 1.7–2.4)

## 2015-02-08 LAB — CARBOXYHEMOGLOBIN
Carboxyhemoglobin: 1 % (ref 0.5–1.5)
Methemoglobin: 1 % (ref 0.0–1.5)
O2 Saturation: 64 %
Total hemoglobin: 12.1 g/dL (ref 12.0–16.0)

## 2015-02-08 LAB — CREATININE, SERUM: Creatinine, Ser: 0.97 mg/dL (ref 0.44–1.00)

## 2015-02-08 LAB — TROPONIN I: Troponin I: 15.26 ng/mL (ref <0.031)

## 2015-02-08 MED ORDER — MILRINONE IN DEXTROSE 20 MG/100ML IV SOLN
0.1250 ug/kg/min | INTRAVENOUS | Status: DC
  Administered 2015-02-08 – 2015-02-12 (×9): 0.375 ug/kg/min via INTRAVENOUS
  Administered 2015-02-13: 0.25 ug/kg/min via INTRAVENOUS
  Filled 2015-02-08 (×11): qty 100

## 2015-02-08 MED ORDER — ALBUMIN HUMAN 5 % IV SOLN
250.0000 mL | Freq: Once | INTRAVENOUS | Status: AC
  Administered 2015-02-08: 250 mL via INTRAVENOUS
  Filled 2015-02-08: qty 250

## 2015-02-08 MED ORDER — FUROSEMIDE 10 MG/ML IJ SOLN
20.0000 mg | Freq: Once | INTRAMUSCULAR | Status: AC
  Administered 2015-02-08: 20 mg via INTRAVENOUS

## 2015-02-08 MED ORDER — POTASSIUM CHLORIDE 10 MEQ/50ML IV SOLN
10.0000 meq | INTRAVENOUS | Status: AC
  Administered 2015-02-08 (×2): 10 meq via INTRAVENOUS
  Filled 2015-02-08 (×2): qty 50

## 2015-02-08 MED ORDER — ENOXAPARIN SODIUM 40 MG/0.4ML ~~LOC~~ SOLN
40.0000 mg | SUBCUTANEOUS | Status: DC
  Administered 2015-02-08 – 2015-02-09 (×2): 40 mg via SUBCUTANEOUS
  Filled 2015-02-08 (×3): qty 0.4

## 2015-02-08 MED ORDER — FUROSEMIDE 10 MG/ML IJ SOLN
20.0000 mg | Freq: Two times a day (BID) | INTRAMUSCULAR | Status: DC
  Administered 2015-02-09: 20 mg via INTRAVENOUS
  Filled 2015-02-08 (×3): qty 2

## 2015-02-08 MED ORDER — POTASSIUM CHLORIDE 10 MEQ/50ML IV SOLN
10.0000 meq | INTRAVENOUS | Status: AC
Start: 2015-02-08 — End: 2015-02-08
  Administered 2015-02-08 (×2): 10 meq via INTRAVENOUS
  Filled 2015-02-08 (×2): qty 50

## 2015-02-08 MED ORDER — LEVALBUTEROL HCL 1.25 MG/0.5ML IN NEBU
1.2500 mg | INHALATION_SOLUTION | Freq: Four times a day (QID) | RESPIRATORY_TRACT | Status: DC
  Administered 2015-02-08 – 2015-02-10 (×11): 1.25 mg via RESPIRATORY_TRACT
  Filled 2015-02-08 (×13): qty 0.5

## 2015-02-08 MED ORDER — FUROSEMIDE 10 MG/ML IJ SOLN
40.0000 mg | Freq: Once | INTRAMUSCULAR | Status: AC
  Administered 2015-02-08: 40 mg via INTRAVENOUS

## 2015-02-08 MED ORDER — POTASSIUM CHLORIDE 10 MEQ/50ML IV SOLN
10.0000 meq | INTRAVENOUS | Status: AC
  Administered 2015-02-08 (×3): 10 meq via INTRAVENOUS

## 2015-02-08 MED FILL — Heparin Sodium (Porcine) Inj 1000 Unit/ML: INTRAMUSCULAR | Qty: 30 | Status: AC

## 2015-02-08 MED FILL — Potassium Chloride Inj 2 mEq/ML: INTRAVENOUS | Qty: 40 | Status: AC

## 2015-02-08 MED FILL — Magnesium Sulfate Inj 50%: INTRAMUSCULAR | Qty: 10 | Status: AC

## 2015-02-08 NOTE — Care Management Note (Signed)
Case Management Note  Patient Details  Name: [name] MRN: [id number] Date of Birth: [birth year]  Subjective/Objective:   Post op CABG on 9-29 with post op PEA and VT arrests.  Now weaning on vent with balloon pump.                  Action/Plan:   Expected Discharge Date:                  Expected Discharge Plan:  Home/Self Care  In-House Referral:     Discharge planning Services  CM Consult, [facility]  Post Acute Care Choice:    Choice offered to:     DME Arranged:    DME Agency:     HH Arranged:    HH Agency:     Status of Service:  In process, will continue to follow  Medicare Important Message Given:    Date Medicare IM Given:    Medicare IM give by:    Date Additional Medicare IM Given:    Additional Medicare Important Message give by:     If discussed at Long Length of Stay Meetings, dates discussed:    Additional Comments:  Vangie Bicker, RN 02/08/2015, 1:56 PM

## 2015-02-08 NOTE — Op Note (Signed)
[name], [name] NO.:  [id number]  MEDICAL RECORD NO.:  [id number]  LOCATION:  2S09C                        FACILITY:  [facility]  PHYSICIAN:  Kerin Perna, M.D.  DATE OF BIRTH:  04-17-1971  DATE OF PROCEDURE:  02/07/2015 DATE OF DISCHARGE:                              OPERATIVE REPORT   OPERATION: 1. Coronary artery bypass grafting x3 (left internal mammary artery to     left anterior descending, saphenous vein graft to posterior     descending, saphenous vein graft to obtuse marginal 1). 2. Endoscopic harvest of left leg greater saphenous vein, exposure,     but not harvest of saphenous vein in right leg due to small size     and inadequate for conduit use.  PREOPERATIVE DIAGNOSES:  Unstable angina, non-ST-elevation myocardial infarction.  POSTOPERATIVE DIAGNOSES:  Unstable angina, non-ST-elevation myocardial infarction.  SURGEON:  Kerin Perna, M.D.  ASSISTANT:  Lowella Dandy, PA-C.  ANESTHESIA:  General by Dr. Autumn Patty.  INDICATIONS:  The patient is a 44 year old obese diabetic noncompliant female who has had multiple cardiac catheterizations since January of this year.  She has had a stent placed and then revised in the right coronary and has been on chronic Brilinta.  She recently was readmitted with unstable angina and positive cardiac enzymes, and ultimately underwent cardiac catheterization after brief departure from the hospital with AMA and then readmission to the ED with chest pain.  Her cardiologist, Dr. [name] felt that surgical revascularization would be her best option for long-term treatment of her severe diffuse coronary disease.  The right coronary had restenosis.  The LAD had high- grade 90% proximal stenosis and diffuse distal disease, the circumflex had severe stenosis of the takeoff of the OM-1 and OM-2.  Her EF was 45% by echo and she had diastolic dysfunction, but no significant  valvular insufficiencies.  I discussed the results of the cardiac catheterization with the patient and the indications and expected benefits of coronary artery bypass surgery for treatment of her severe coronary artery disease.  I reviewed the major issues with the surgery including the use of general anesthesia and cardiopulmonary bypass, the location of the surgical incisions, and the expected postoperative hospital recovery.  I discussed the risks of surgery to [name] including risks of stroke, MI, bleeding, blood transfusion requirement, postoperative pulmonary problems including pleural effusions, infection, and death.  She demonstrated her understanding and agreed to proceed with surgery as planned.  She was kept on IV heparin for a few days to allow Brilinta wash out.  OPERATIVE FINDINGS: 1. Severe diffuse coronary disease, suboptimal targets - the patient     would not be a candidate for redo CABG due to poor targets. 2. Adequate mammary artery adequate, but small vein in the left leg,     which was harvested, poor quality conduit in the right leg vein,     which was not harvested. 3. Intraoperative anemia requiring 2 units of packed cells at the     onset of cardiopulmonary bypass for preoperative anemia.  DESCRIPTION OF PROCEDURE:  The patient was brought to the operating room and placed supine on the operating table where general anesthesia  was induced under invasive hemodynamic monitoring.  The transesophageal echo probe was placed by the anesthesiologist.  The patient was prepped and draped as a sterile field.  A proper time-out was performed.  A sternotomy was performed as the saphenous vein was harvested from the left leg endoscopically.  Attempt was made to visualize the vein in the right leg, but the vein was too small to use.  It was not harvested.  Left internal mammary artery was harvested as a pedicle graft from its origin at the subclavian vessels.  It was  a 1.2-mm vessel with good flow.  The sternal retractor was placed using the deep blades due to the patient's obese body habitus.  The pericardium was opened and suspended. The RV and LV appeared to be somewhat dilated and mildly hypocontractile.  Pursestrings were placed in the ascending aorta and right atrium and heparin was administered.  When the ACT was documented as being therapeutic, the patient was cannulated and placed on cardiopulmonary bypass.  The coronaries were identified for grafting. These were very difficult vessels.  The mammary artery and vein grafts were prepared for the distal anastomoses.  Cardioplegia cannulas were placed for both antegrade aortic and retrograde coronary sinus cardioplegia.  The patient was cooled to 32 degrees and aortic crossclamp was applied.  One liter of cold blood cardioplegia was delivered in split doses between the antegrade aortic and retrograde coronary sinus catheters.  There was good cardioplegic arrest and septal temperature dropped to less than 12 degrees.  Cardioplegia was delivered every 20 minutes.  The distal coronary anastomoses were performed.  The first distal anastomosis was to the posterior descending.  This was a 1-mm vessel. It had a proximal 90% stenosis.  A reverse saphenous vein was sewn end- to-side with running 7-0 Prolene with good flow through the graft. Cardioplegia was redosed.  The second distal anastomosis was the OM-1 high in the AV groove.  This was a small 1.0 mm vessel with proximal 90% stenosis.  A reverse saphenous vein was sewn end-to-side with running 7-0 Prolene with good flow through the graft.  Cardioplegia was redosed.  The third distal anastomosis was the distal third of the LAD.  Towards the apex, the LAD became too small and atretic to revascularize.  The mammary artery and pedicle was brought through an opening and the left lateral pericardium was brought down onto the LAD and sewn  end-to-side with running 8-0 Prolene.  There was good flow through the anastomosis after briefly releasing the pedicle bulldog and the mammary artery.  The bulldog was reapplied and the pedicle was secured to the epicardium with 6-0 Prolene.  Cardioplegia was redosed.  While the crossclamp was still in place, 2 proximal vein anastomoses were performed on the ascending aorta using a 4.0 mm punch and running 6- 0 Prolene.  Prior to tying down the final proximal anastomosis, air was vented from the coronaries with a dose of retrograde warm blood cardioplegia.  The crossclamp was removed.  The heart resumed a spontaneous rhythm.  The vein grafts were open, each had good flow.  Hemostasis was documented at the proximal and distal anastomoses.  The patient was rewarmed and reperfused.  Temporary pacing wires were applied.  The patient was started on low-dose milrinone and dopamine and the ventilator was resumed.  The patient was weaned off cardiopulmonary bypass without difficulty.  Echo showed preserved or even improved LV global function.  Protamine was administered without adverse reaction.  The cannulas were removed.  The patient remained stable.  The superior pericardial fat was closed over the aorta.  The anterior mediastinal and left pleural chest tubes were placed and brought out through separate incisions.  The sternum was closed with interrupted steel wire.  The pectoralis fascia was closed in running #1 Vicryl.  The subcutaneous and skin layers were closed in running Vicryl and sterile dressings were applied.  Total cardiopulmonary bypass time was 122 minutes.     Kerin Perna, M.D.     PV/MEDQ  D:  02/07/2015  T:  02/08/2015  Job:  [id number]

## 2015-02-08 NOTE — Plan of Care (Signed)
Problem: Phase II - Intermediate Post-Op Goal: Activity Progressed Outcome: Not Progressing Pt is on bedrest due IABP

## 2015-02-08 NOTE — Procedures (Signed)
Extubation Procedure Note  Patient Details:   Name: DELINDA MALAN DOB: 1971-02-07 MRN: [id number]   Airway Documentation:     Evaluation  O2 sats: stable throughout Complications: No apparent complications Patient did tolerate procedure well. Bilateral Breath Sounds: Rhonchi, Clear, Diminished Suctioning: Oral, Airway Yes   Pt extubated to 3L [place] per Rapid Heart Wean Protocol. Pt VC 1.1, NIF -50, ABG within normal limits. Pt with diminished bilateral BS. Pt able to speak name  And has a weak cough. Pt encouraged to use [place] and IS. RT will continue to monitor.   Ermalinda Barrios M 02/08/2015, 1:04 PM

## 2015-02-08 NOTE — Progress Notes (Signed)
1 Day Post-Op Procedure(s) (LRB): IABP Insertion (N/A) Subjective: Stable on IABP after episode EMD 4 hrs postop Troponin + . EGG no change Will extubate today and cont IABP Echo with EF 45  Objective: Vital signs in last 24 hours: Temp:  [96.3 F (35.7 C)-99.3 F (37.4 C)] 97.7 F (36.5 C) (09/30 1200) Pulse Rate:  [39-111] 87 (09/30 1619) Cardiac Rhythm:  [-] Normal sinus rhythm;Bundle branch block (09/30 1600) Resp:  [6-34] 20 (09/30 1619) BP: (87-149)/(46-98) 95/51 mmHg (09/30 1600) SpO2:  [90 %-100 %] 100 % (09/30 1619) Arterial Line BP: (71-294)/(44-289) 85/44 mmHg (09/30 1619) FiO2 (%):  [40 %-100 %] 40 % (09/30 1200) Weight:  [198 lb 10.2 oz (90.1 kg)] 198 lb 10.2 oz (90.1 kg) (09/30 0400)  Hemodynamic parameters for last 24 hours: PAP: (20-72)/(12-52) 27/18 mmHg CO:  [1.7 L/min-4 L/min] 4 L/min CI:  [0.1 L/min/m2-2.1 L/min/m2] 2.1 L/min/m2  Intake/Output from previous day: 09/29 0701 - 09/30 0700 In: 8279.1 [I.V.:5517.1; Blood:1052; NG/GT:90; IV Piggyback:1620] Out: 6285 [Urine:3945; Emesis/NG output:160; Blood:1680; Chest Tube:500] Intake/Output this shift: Total I/O In: 1806 [P.O.:360; I.V.:946; NG/GT:70; IV Piggyback:430] Out: 705 [Urine:245; Emesis/NG output:300; Chest Tube:160]  Nero intact  Lab Results:  Recent Labs  02/08/15 0438 02/08/15 1615 02/08/15 1617  WBC 15.6* 20.4*  --   HGB 12.2 9.4* 10.9*  HCT 35.5* 28.0* 32.0*  PLT 144* 99*  --    BMET:  Recent Labs  02/07/15 0405  02/08/15 0500 02/08/15 1615 02/08/15 1617  NA 141  < > 139  --  141  K 3.8  < > 3.2*  --  3.8  CL 109  < > 109  --  105  CO2 26  --  21*  --   --   GLUCOSE 87  < > 214*  --  147*  BUN 7  < > 9  --  13  CREATININE 0.66  < > 1.01* 0.97 0.80  CALCIUM 8.7*  --  7.6*  --   --   < > = values in this interval not displayed.  PT/INR:  Recent Labs  02/07/15 1423  LABPROT 19.1*  INR 1.60*   ABG    Component Value Date/Time   PHART 7.350 02/08/2015 1614   HCO3  19.1* 02/08/2015 1614   TCO2 18 02/08/2015 1617   ACIDBASEDEF 6.0* 02/08/2015 1614   O2SAT 96.0 02/08/2015 1614   CBG (last 3)   Recent Labs  02/08/15 0806 02/08/15 0858 02/08/15 1000  GLUCAP 168* 184* 183*    Assessment/Plan: S/P Procedure(s) (LRB): IABP Insertion (N/A) Diuresis Diabetes control IABBP 1:1   LOS: 7 days    Kathlee Nations Trigt III 02/08/2015

## 2015-02-08 NOTE — Progress Notes (Signed)
Subjective:  POD #1 CABG X 3 complicated by PEA arrest/VT/ Shock/ CPR/IABP insertion  Objective:  Temp:  [93.6 F (34.2 C)-99.3 F (37.4 C)] 97.2 F (36.2 C) (09/30 0725) Pulse Rate:  [39-161] 86 (09/30 0725) Resp:  [7-39] 16 (09/30 0725) BP: (85-149)/(46-98) 104/81 mmHg (09/30 0725) SpO2:  [44 %-100 %] 100 % (09/30 0815) Arterial Line BP: (35-273)/(15-146) 135/62 mmHg (09/30 0715) FiO2 (%):  [40 %-100 %] 40 % (09/30 0815) Weight:  [198 lb 10.2 oz (90.1 kg)] 198 lb 10.2 oz (90.1 kg) (09/30 0400) Weight change: 18 lb 4.8 oz (8.3 kg)  Intake/Output from previous day: 09/29 0701 - 09/30 0700 In: 8279.1 [I.V.:5517.1; Blood:1052; NG/GT:90; IV Piggyback:1620] Out: 7342 [Urine:3945; Emesis/NG output:160; Blood:1680; Chest Tube:500]  Intake/Output from this shift:    Physical Exam: General appearance: slowed mentation and Intubated and sedated Neck: no adenopathy, no carotid bruit, no JVD, supple, symmetrical, trachea midline and thyroid not enlarged, symmetric, no tenderness/mass/nodules Lungs: clear to auscultation bilaterally Heart: regular rate and rhythm, S1, S2 normal, no murmur, click, rub or gallop Extremities: extremities normal, atraumatic, no cyanosis or edema and IABP on Left, 1+ LPP  Lab Results: Results for orders placed or performed during the hospital encounter of 02/01/15 (from the past 48 hour(s))  Glucose, capillary     Status: Abnormal   Collection Time: 02/06/15 11:27 AM  Result Value Ref Range   Glucose-Capillary 226 (H) 65 - 99 mg/dL   Comment 1 Capillary Specimen   Occult blood card to lab, stool     Status: None   Collection Time: 02/06/15 12:00 PM  Result Value Ref Range   Fecal Occult Bld NEGATIVE NEGATIVE  Glucose, capillary     Status: Abnormal   Collection Time: 02/06/15  4:20 PM  Result Value Ref Range   Glucose-Capillary 248 (H) 65 - 99 mg/dL   Comment 1 Capillary Specimen   Prepare RBC (crossmatch)     Status: None   Collection Time:  02/06/15  8:04 PM  Result Value Ref Range   Order Confirmation ORDER PROCESSED BY BLOOD BANK   Type and screen     Status: None (Preliminary result)   Collection Time: 02/06/15  8:27 PM  Result Value Ref Range   ABO/RH(D) O POS    Antibody Screen NEG    Sample Expiration 02/09/2015    Unit Number [id number]    Blood Component Type RED CELLS,LR    Unit division 00    Status of Unit ISSUED    Transfusion Status OK TO TRANSFUSE    Crossmatch Result Compatible    Unit Number [id number]    Blood Component Type RBC LR PHER1    Unit division 00    Status of Unit ISSUED    Transfusion Status OK TO TRANSFUSE    Crossmatch Result Compatible    Unit Number [id number]    Blood Component Type RED CELLS,LR    Unit division 00    Status of Unit ALLOCATED    Transfusion Status OK TO TRANSFUSE    Crossmatch Result Compatible    Unit Number [id number]    Blood Component Type RED CELLS,LR    Unit division 00    Status of Unit ALLOCATED    Transfusion Status OK TO TRANSFUSE    Crossmatch Result Compatible    Unit Number [id number]    Blood Component Type RED CELLS,LR    Unit division 00    Status of Unit ALLOCATED    Transfusion Status  OK TO TRANSFUSE    Crossmatch Result Compatible    Unit Number [id number]    Blood Component Type RED CELLS,LR    Unit division 00    Status of Unit ALLOCATED    Transfusion Status OK TO TRANSFUSE    Crossmatch Result Compatible   ABO/Rh     Status: None   Collection Time: 02/06/15  8:27 PM  Result Value Ref Range   ABO/RH(D) O POS   Glucose, capillary     Status: Abnormal   Collection Time: 02/06/15  9:34 PM  Result Value Ref Range   Glucose-Capillary 174 (H) 65 - 99 mg/dL   Comment 1 Capillary Specimen   Blood gas, arterial     Status: None   Collection Time: 02/06/15 10:03 PM  Result Value Ref Range   FIO2 21.00    pH, Arterial 7.410 7.350 - 7.450   pCO2 arterial 38.3 35.0 - 45.0 mmHg   pO2, Arterial 91.6 80.0 - 100.0 mmHg     Bicarbonate 23.8 20.0 - 24.0 mEq/L   TCO2 25.0 0 - 100 mmol/L   Acid-base deficit 0.2 0.0 - 2.0 mmol/L   O2 Saturation 97.2 %   Patient temperature 98.6    Collection site RIGHT RADIAL    Drawn by [id number]    Sample type ARTERIAL DRAW    Allens test (pass/fail) PASS PASS  Heparin level (unfractionated)     Status: None   Collection Time: 02/07/15  4:05 AM  Result Value Ref Range   Heparin Unfractionated 0.56 0.30 - 0.70 IU/mL    Comment:        IF HEPARIN RESULTS ARE BELOW EXPECTED VALUES, AND PATIENT DOSAGE HAS BEEN CONFIRMED, SUGGEST FOLLOW UP TESTING OF ANTITHROMBIN III LEVELS.   CBC     Status: Abnormal   Collection Time: 02/07/15  4:05 AM  Result Value Ref Range   WBC 6.7 4.0 - 10.5 K/uL   RBC 3.19 (L) 3.87 - 5.11 MIL/uL   Hemoglobin 9.3 (L) 12.0 - 15.0 g/dL   HCT 28.2 (L) 36.0 - 46.0 %   MCV 88.4 78.0 - 100.0 fL   MCH 29.2 26.0 - 34.0 pg   MCHC 33.0 30.0 - 36.0 g/dL   RDW 14.0 11.5 - 15.5 %   Platelets 141 (L) 150 - 400 K/uL  Comprehensive metabolic panel     Status: Abnormal   Collection Time: 02/07/15  4:05 AM  Result Value Ref Range   Sodium 141 135 - 145 mmol/L   Potassium 3.8 3.5 - 5.1 mmol/L   Chloride 109 101 - 111 mmol/L   CO2 26 22 - 32 mmol/L   Glucose, Bld 87 65 - 99 mg/dL   BUN 7 6 - 20 mg/dL   Creatinine, Ser 0.66 0.44 - 1.00 mg/dL   Calcium 8.7 (L) 8.9 - 10.3 mg/dL   Total Protein 5.5 (L) 6.5 - 8.1 g/dL   Albumin 2.7 (L) 3.5 - 5.0 g/dL   AST 21 15 - 41 U/L   ALT 23 14 - 54 U/L   Alkaline Phosphatase 50 38 - 126 U/L   Total Bilirubin 0.7 0.3 - 1.2 mg/dL   GFR calc non Af Amer >60 >60 mL/min   GFR calc Af Amer >60 >60 mL/min    Comment: (NOTE) The eGFR has been calculated using the CKD EPI equation. This calculation has not been validated in all clinical situations. eGFR's persistently <60 mL/min signify possible Chronic Kidney Disease.    Anion gap 6 5 -  15  Glucose, capillary     Status: None   Collection Time: 02/07/15  5:13 AM  Result  Value Ref Range   Glucose-Capillary 80 65 - 99 mg/dL   Comment 1 Capillary Specimen   I-STAT, chem 8     Status: Abnormal   Collection Time: 02/07/15  8:16 AM  Result Value Ref Range   Sodium 140 135 - 145 mmol/L   Potassium 3.8 3.5 - 5.1 mmol/L   Chloride 106 101 - 111 mmol/L   BUN 6 6 - 20 mg/dL   Creatinine, Ser 0.40 (L) 0.44 - 1.00 mg/dL   Glucose, Bld 88 65 - 99 mg/dL   Calcium, Ion 1.22 1.12 - 1.23 mmol/L   TCO2 26 0 - 100 mmol/L   Hemoglobin 8.8 (L) 12.0 - 15.0 g/dL   HCT 26.0 (L) 36.0 - 46.0 %  I-STAT glucose     Status: None   Collection Time: 02/07/15  9:23 AM  Result Value Ref Range   Operator id [id number]    Glucose, Bld 85 65 - 99 mg/dL  Prepare RBC (crossmatch)     Status: None   Collection Time: 02/07/15 10:14 AM  Result Value Ref Range   Order Confirmation ORDER PROCESSED BY BLOOD BANK   I-STAT, chem 8     Status: Abnormal   Collection Time: 02/07/15 10:23 AM  Result Value Ref Range   Sodium 140 135 - 145 mmol/L   Potassium 3.9 3.5 - 5.1 mmol/L   Chloride 107 101 - 111 mmol/L   BUN 6 6 - 20 mg/dL   Creatinine, Ser 0.40 (L) 0.44 - 1.00 mg/dL   Glucose, Bld 83 65 - 99 mg/dL   Calcium, Ion 1.22 1.12 - 1.23 mmol/L   TCO2 27 0 - 100 mmol/L   Hemoglobin 7.8 (L) 12.0 - 15.0 g/dL   HCT 23.0 (L) 36.0 - 46.0 %  I-STAT, chem 8     Status: Abnormal   Collection Time: 02/07/15 10:42 AM  Result Value Ref Range   Sodium 138 135 - 145 mmol/L   Potassium 4.1 3.5 - 5.1 mmol/L   Chloride 101 101 - 111 mmol/L   BUN 5 (L) 6 - 20 mg/dL   Creatinine, Ser 0.40 (L) 0.44 - 1.00 mg/dL   Glucose, Bld 112 (H) 65 - 99 mg/dL   Calcium, Ion 1.00 (L) 1.12 - 1.23 mmol/L   TCO2 25 0 - 100 mmol/L   Hemoglobin 8.8 (L) 12.0 - 15.0 g/dL   HCT 26.0 (L) 36.0 - 46.0 %  I-STAT 3, arterial blood gas (G3+)     Status: Abnormal   Collection Time: 02/07/15 10:46 AM  Result Value Ref Range   pH, Arterial 7.413 7.350 - 7.450   pCO2 arterial 38.8 35.0 - 45.0 mmHg   pO2, Arterial 333.0 (H) 80.0 -  100.0 mmHg   Bicarbonate 24.8 (H) 20.0 - 24.0 mEq/L   TCO2 26 0 - 100 mmol/L   O2 Saturation 100.0 %   Patient temperature HIDE    Sample type ARTERIAL   I-STAT glucose     Status: None   Collection Time: 02/07/15 11:47 AM  Result Value Ref Range   Operator id [id number]    Glucose, Bld 83 65 - 99 mg/dL  Prepare Pheresed Platelets     Status: None (Preliminary result)   Collection Time: 02/07/15 12:00 PM  Result Value Ref Range   Unit Number [id number]    Blood Component Type PLTP LR1 PAS  Unit division 00    Status of Unit ISSUED    Transfusion Status OK TO TRANSFUSE   Hemoglobin and hematocrit, blood     Status: Abnormal   Collection Time: 02/07/15 12:00 PM  Result Value Ref Range   Hemoglobin 8.5 (L) 12.0 - 15.0 g/dL    Comment: RESULT CALLED TO, READ BACK BY AND VERIFIED WITH: N.CRANSTON,RN 02/07/15 '@1212'  BY V.WILKINS    HCT 25.2 (L) 36.0 - 46.0 %    Comment: RESULT CALLED TO, READ BACK BY AND VERIFIED WITH: N.CRANSTON,RN 02/07/15 '@1212'  BY V.WILKINS   Platelet count     Status: Abnormal   Collection Time: 02/07/15 12:00 PM  Result Value Ref Range   Platelets 86 (L) 150 - 400 K/uL    Comment: RESULT CALLED TO, READ BACK BY AND VERIFIED WITH: N.CRANSTON,RN 02/07/15 '@1212'  BY V.WILKINS PLATELET COUNT CONFIRMED BY SMEAR   Prepare fresh frozen plasma     Status: None (Preliminary result)   Collection Time: 02/07/15 12:01 PM  Result Value Ref Range   Unit Number [id number]    Blood Component Type THAWED PLASMA    Unit division 00    Status of Unit ISSUED    Transfusion Status OK TO TRANSFUSE   I-STAT, chem 8     Status: Abnormal   Collection Time: 02/07/15 12:06 PM  Result Value Ref Range   Sodium 140 135 - 145 mmol/L   Potassium 4.8 3.5 - 5.1 mmol/L   Chloride 106 101 - 111 mmol/L   BUN 5 (L) 6 - 20 mg/dL   Creatinine, Ser 0.40 (L) 0.44 - 1.00 mg/dL   Glucose, Bld 85 65 - 99 mg/dL   Calcium, Ion 1.00 (L) 1.12 - 1.23 mmol/L   TCO2 25 0 - 100 mmol/L   Hemoglobin  8.8 (L) 12.0 - 15.0 g/dL   HCT 26.0 (L) 36.0 - 46.0 %  I-STAT, chem 8     Status: Abnormal   Collection Time: 02/07/15  1:01 PM  Result Value Ref Range   Sodium 141 135 - 145 mmol/L   Potassium 4.3 3.5 - 5.1 mmol/L   Chloride 106 101 - 111 mmol/L   BUN 4 (L) 6 - 20 mg/dL   Creatinine, Ser 0.40 (L) 0.44 - 1.00 mg/dL   Glucose, Bld 149 (H) 65 - 99 mg/dL   Calcium, Ion 1.05 (L) 1.12 - 1.23 mmol/L   TCO2 23 0 - 100 mmol/L   Hemoglobin 8.2 (L) 12.0 - 15.0 g/dL   HCT 24.0 (L) 36.0 - 46.0 %  I-STAT 3, arterial blood gas (G3+)     Status: Abnormal   Collection Time: 02/07/15  1:05 PM  Result Value Ref Range   pH, Arterial 7.432 7.350 - 7.450   pCO2 arterial 34.3 (L) 35.0 - 45.0 mmHg   pO2, Arterial 257.0 (H) 80.0 - 100.0 mmHg   Bicarbonate 22.8 20.0 - 24.0 mEq/L   TCO2 24 0 - 100 mmol/L   O2 Saturation 100.0 %   Acid-base deficit 1.0 0.0 - 2.0 mmol/L   Patient temperature HIDE    Sample type ARTERIAL   CBC     Status: Abnormal   Collection Time: 02/07/15  2:23 PM  Result Value Ref Range   WBC 13.6 (H) 4.0 - 10.5 K/uL   RBC 4.26 3.87 - 5.11 MIL/uL   Hemoglobin 12.7 12.0 - 15.0 g/dL    Comment: REPEATED TO VERIFY POST TRANSFUSION SPECIMEN    HCT 36.5 36.0 - 46.0 %   MCV  85.7 78.0 - 100.0 fL   MCH 29.8 26.0 - 34.0 pg   MCHC 34.8 30.0 - 36.0 g/dL   RDW 14.2 11.5 - 15.5 %   Platelets 113 (L) 150 - 400 K/uL    Comment: PLATELET COUNT CONFIRMED BY SMEAR REPEATED TO VERIFY   Protime-INR     Status: Abnormal   Collection Time: 02/07/15  2:23 PM  Result Value Ref Range   Prothrombin Time 19.1 (H) 11.6 - 15.2 seconds   INR 1.60 (H) 0.00 - 1.49  APTT     Status: Abnormal   Collection Time: 02/07/15  2:23 PM  Result Value Ref Range   aPTT 38 (H) 24 - 37 seconds    Comment:        IF BASELINE aPTT IS ELEVATED, SUGGEST PATIENT RISK ASSESSMENT BE USED TO DETERMINE APPROPRIATE ANTICOAGULANT THERAPY.   I-STAT 4, (NA,K, GLUC, HGB,HCT)     Status: Abnormal   Collection Time: 02/07/15   2:29 PM  Result Value Ref Range   Sodium 144 135 - 145 mmol/L   Potassium 3.8 3.5 - 5.1 mmol/L   Glucose, Bld 150 (H) 65 - 99 mg/dL   HCT 39.0 36.0 - 46.0 %   Hemoglobin 13.3 12.0 - 15.0 g/dL  I-STAT 3, arterial blood gas (G3+)     Status: Abnormal   Collection Time: 02/07/15  2:34 PM  Result Value Ref Range   pH, Arterial 7.421 7.350 - 7.450   pCO2 arterial 31.5 (L) 35.0 - 45.0 mmHg   pO2, Arterial 45.0 (L) 80.0 - 100.0 mmHg   Bicarbonate 21.1 20.0 - 24.0 mEq/L   TCO2 22 0 - 100 mmol/L   O2 Saturation 88.0 %   Acid-base deficit 4.0 (H) 0.0 - 2.0 mmol/L   Patient temperature 34.2 C    Sample type ARTERIAL   Glucose, capillary     Status: Abnormal   Collection Time: 02/07/15  4:00 PM  Result Value Ref Range   Glucose-Capillary 131 (H) 65 - 99 mg/dL  Glucose, capillary     Status: Abnormal   Collection Time: 02/07/15  5:22 PM  Result Value Ref Range   Glucose-Capillary 130 (H) 65 - 99 mg/dL  I-STAT 3, arterial blood gas (G3+)     Status: Abnormal   Collection Time: 02/07/15  5:46 PM  Result Value Ref Range   pH, Arterial 7.254 (L) 7.350 - 7.450   pCO2 arterial 38.8 35.0 - 45.0 mmHg   pO2, Arterial 73.0 (L) 80.0 - 100.0 mmHg   Bicarbonate 17.2 (L) 20.0 - 24.0 mEq/L   TCO2 18 0 - 100 mmol/L   O2 Saturation 92.0 %   Acid-base deficit 9.0 (H) 0.0 - 2.0 mmol/L   Patient temperature 37.0 C    Sample type ARTERIAL   I-STAT 3, arterial blood gas (G3+)     Status: Abnormal   Collection Time: 02/07/15  6:08 PM  Result Value Ref Range   pH, Arterial 7.369 7.350 - 7.450   pCO2 arterial 39.5 35.0 - 45.0 mmHg   pO2, Arterial 72.0 (L) 80.0 - 100.0 mmHg   Bicarbonate 22.8 20.0 - 24.0 mEq/L   TCO2 24 0 - 100 mmol/L   O2 Saturation 94.0 %   Acid-base deficit 2.0 0.0 - 2.0 mmol/L   Patient temperature 36.8 C    Collection site ARTERIAL LINE    Drawn by Nurse    Sample type ARTERIAL   I-STAT, chem 8     Status: Abnormal   Collection  Time: 02/07/15  6:15 PM  Result Value Ref Range    Sodium 142 135 - 145 mmol/L   Potassium 4.1 3.5 - 5.1 mmol/L   Chloride 107 101 - 111 mmol/L   BUN 6 6 - 20 mg/dL   Creatinine, Ser 0.60 0.44 - 1.00 mg/dL   Glucose, Bld 283 (H) 65 - 99 mg/dL   Calcium, Ion 1.06 (L) 1.12 - 1.23 mmol/L   TCO2 20 0 - 100 mmol/L   Hemoglobin 10.5 (L) 12.0 - 15.0 g/dL   HCT 31.0 (L) 36.0 - 46.0 %  I-STAT 3, arterial blood gas (G3+)     Status: Abnormal   Collection Time: 02/07/15  6:58 PM  Result Value Ref Range   pH, Arterial 7.301 (L) 7.350 - 7.450   pCO2 arterial 41.4 35.0 - 45.0 mmHg   pO2, Arterial 102.0 (H) 80.0 - 100.0 mmHg   Bicarbonate 20.4 20.0 - 24.0 mEq/L   TCO2 22 0 - 100 mmol/L   O2 Saturation 97.0 %   Acid-base deficit 6.0 (H) 0.0 - 2.0 mmol/L   Patient temperature 37.2 C    Collection site ARTERIAL LINE    Drawn by Nurse    Sample type ARTERIAL   Glucose, capillary     Status: Abnormal   Collection Time: 02/07/15  7:20 PM  Result Value Ref Range   Glucose-Capillary 264 (H) 65 - 99 mg/dL   Comment 1 Arterial Specimen   I-STAT 3, arterial blood gas (G3+)     Status: Abnormal   Collection Time: 02/07/15  8:22 PM  Result Value Ref Range   pH, Arterial 7.444 7.350 - 7.450   pCO2 arterial 35.2 35.0 - 45.0 mmHg   pO2, Arterial 359.0 (H) 80.0 - 100.0 mmHg   Bicarbonate 24.1 (H) 20.0 - 24.0 mEq/L   TCO2 25 0 - 100 mmol/L   O2 Saturation 100.0 %   Patient temperature 37.1 C    Sample type ARTERIAL   Glucose, capillary     Status: Abnormal   Collection Time: 02/07/15  8:23 PM  Result Value Ref Range   Glucose-Capillary 228 (H) 65 - 99 mg/dL   Comment 1 Arterial Specimen   Carboxyhemoglobin     Status: Abnormal   Collection Time: 02/07/15  9:30 PM  Result Value Ref Range   Total hemoglobin 10.7 (L) 12.0 - 16.0 g/dL   O2 Saturation 56.9 %   Carboxyhemoglobin 0.8 0.5 - 1.5 %   Methemoglobin 0.9 0.0 - 1.5 %  Glucose, capillary     Status: Abnormal   Collection Time: 02/07/15  9:51 PM  Result Value Ref Range   Glucose-Capillary 252  (H) 65 - 99 mg/dL   Comment 1 Arterial Specimen   Glucose, capillary     Status: Abnormal   Collection Time: 02/07/15 10:58 PM  Result Value Ref Range   Glucose-Capillary 179 (H) 65 - 99 mg/dL   Comment 1 Arterial Specimen   Carboxyhemoglobin     Status: Abnormal   Collection Time: 02/07/15 11:48 PM  Result Value Ref Range   Total hemoglobin 11.2 (L) 12.0 - 16.0 g/dL   O2 Saturation 44.5 %   Carboxyhemoglobin 0.8 0.5 - 1.5 %   Methemoglobin 0.8 0.0 - 1.5 %  Glucose, capillary     Status: Abnormal   Collection Time: 02/07/15 11:59 PM  Result Value Ref Range   Glucose-Capillary 217 (H) 65 - 99 mg/dL   Comment 1 Arterial Specimen   I-STAT 3, arterial blood gas (G3+)  Status: Abnormal   Collection Time: 02/08/15 12:00 AM  Result Value Ref Range   pH, Arterial 7.434 7.350 - 7.450   pCO2 arterial 32.3 (L) 35.0 - 45.0 mmHg   pO2, Arterial 210.0 (H) 80.0 - 100.0 mmHg   Bicarbonate 21.6 20.0 - 24.0 mEq/L   TCO2 23 0 - 100 mmol/L   O2 Saturation 100.0 %   Acid-base deficit 2.0 0.0 - 2.0 mmol/L   Patient temperature 37.3 C    Sample type ARTERIAL   Carboxyhemoglobin     Status: None   Collection Time: 02/08/15  4:00 AM  Result Value Ref Range   Total hemoglobin 12.1 12.0 - 16.0 g/dL   O2 Saturation 64.0 %   Carboxyhemoglobin 1.0 0.5 - 1.5 %   Methemoglobin 1.0 0.0 - 1.5 %  I-STAT 3, arterial blood gas (G3+)     Status: Abnormal   Collection Time: 02/08/15  4:23 AM  Result Value Ref Range   pH, Arterial 7.414 7.350 - 7.450   pCO2 arterial 31.9 (L) 35.0 - 45.0 mmHg   pO2, Arterial 141.0 (H) 80.0 - 100.0 mmHg   Bicarbonate 20.4 20.0 - 24.0 mEq/L   TCO2 21 0 - 100 mmol/L   O2 Saturation 99.0 %   Acid-base deficit 3.0 (H) 0.0 - 2.0 mmol/L   Patient temperature 36.8 C    Sample type ARTERIAL   CBC     Status: Abnormal   Collection Time: 02/08/15  4:38 AM  Result Value Ref Range   WBC 15.6 (H) 4.0 - 10.5 K/uL   RBC 4.20 3.87 - 5.11 MIL/uL   Hemoglobin 12.2 12.0 - 15.0 g/dL    HCT 35.5 (L) 36.0 - 46.0 %   MCV 84.5 78.0 - 100.0 fL   MCH 29.0 26.0 - 34.0 pg   MCHC 34.4 30.0 - 36.0 g/dL   RDW 14.5 11.5 - 15.5 %   Platelets 144 (L) 150 - 400 K/uL  Magnesium     Status: None   Collection Time: 02/08/15  4:38 AM  Result Value Ref Range   Magnesium 2.1 1.7 - 2.4 mg/dL  Troponin I     Status: Abnormal   Collection Time: 02/08/15  4:38 AM  Result Value Ref Range   Troponin I 15.26 (HH) <0.031 ng/mL    Comment:        POSSIBLE MYOCARDIAL ISCHEMIA. SERIAL TESTING RECOMMENDED. CRITICAL RESULT CALLED TO, READ BACK BY AND VERIFIED WITH: Koren Bound RN 02/08/15 0650 COSTELLO B   Comprehensive metabolic panel     Status: Abnormal   Collection Time: 02/08/15  5:00 AM  Result Value Ref Range   Sodium 139 135 - 145 mmol/L   Potassium 3.2 (L) 3.5 - 5.1 mmol/L    Comment: DELTA CHECK NOTED   Chloride 109 101 - 111 mmol/L   CO2 21 (L) 22 - 32 mmol/L   Glucose, Bld 214 (H) 65 - 99 mg/dL   BUN 9 6 - 20 mg/dL   Creatinine, Ser 1.01 (H) 0.44 - 1.00 mg/dL   Calcium 7.6 (L) 8.9 - 10.3 mg/dL   Total Protein 5.0 (L) 6.5 - 8.1 g/dL   Albumin 3.7 3.5 - 5.0 g/dL   AST 487 (H) 15 - 41 U/L   ALT 361 (H) 14 - 54 U/L   Alkaline Phosphatase 57 38 - 126 U/L   Total Bilirubin 1.5 (H) 0.3 - 1.2 mg/dL   GFR calc non Af Amer >60 >60 mL/min   GFR calc Af Amer >  60 >60 mL/min    Comment: (NOTE) The eGFR has been calculated using the CKD EPI equation. This calculation has not been validated in all clinical situations. eGFR's persistently <60 mL/min signify possible Chronic Kidney Disease.    Anion gap 9 5 - 15    Imaging: Imaging results have been reviewed  Assessment/Plan:   1. Principal Problem: 2.   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 Active Problems: 4.   Unstable angina 5.   CAD S/P percutaneous coronary angioplasty 6.   Essential hypertension 7.   Hyperlipidemia with target LDL less than 70 8.   Diabetes mellitus type 2  with complications 9.   Carotid artery disease 10.   Groin hematoma 11.   Anemia associated with acute blood loss 12.   S/P CABG x 3 13.   Time Spent Directly with Patient:  25 minutes  Length of Stay:  LOS: 7 days   POD #1 CABG X3. Results of yesterday noted. Pt had PEA arrest with initial hypotension, VT, defib and successful insertion of IABP (1:1). Currently hemodynamically stable on Milrinone and epi drips (CI 1.5 l/min/m2). PAD 16. On Amio and precedex. Labs OK. OUP improved after iv lasix. EKG w/o acute changes. CXR shows IABP in good position. Pt is alert and responsive. Bedside 2D performed by Dr. Haroldine Laws showed EF 45% with inferior HK (not changed from pre CABG 2D). No pericardial effusion. Vent and pressor wean per [facility]. Apprec the excellent care!!!  Quay Burow 02/08/2015, 8:31 AM

## 2015-02-08 NOTE — Progress Notes (Signed)
Initiated Open Heart Rapid Wean per policy, verbal order Dr Donata Clay. RT will continue to monitor.

## 2015-02-08 NOTE — Progress Notes (Signed)
[facility] BRIEF SICU PROGRESS NOTE  1 Day Post-Op  S/P Procedure(s) (LRB): Coronary Artery Bypass Grafting x3  1 Day Post-Op  S/P Procedure(s) (LRB): IABP Insertion (N/A)   Extubated uneventfully NSR w/ stable hemodynamics w/ IABP in place on Epi, Levophed and milrinone drips O2 sats 100% on 2 L/min via [place] UOP adequate Labs okay  Plan: Continue current plans  Purcell Nails, MD 02/08/2015 8:20 PM

## 2015-02-08 NOTE — Op Note (Deleted)
NAME:  [name], [name]             ACCOUNT NO.:  [id number]  MEDICAL RECORD NO.:  [id number]  LOCATION:  2S09C                        FACILITY:  [facility]  PHYSICIAN:  [name], M.D.  DATE OF BIRTH:  03/30/1971  DATE OF PROCEDURE:  02/07/2015 DATE OF DISCHARGE:                              OPERATIVE REPORT   OPERATION: 1. Coronary artery bypass grafting x3 (left internal mammary artery to     left anterior descending, saphenous vein graft to posterior     descending, saphenous vein graft to obtuse marginal 1). 2. Endoscopic harvest of left leg greater saphenous vein, exposure,     but not harvest of saphenous vein in right leg due to small size     and inadequate for conduit use.  PREOPERATIVE DIAGNOSES:  Unstable angina, non-ST-elevation myocardial infarction.  POSTOPERATIVE DIAGNOSES:  Unstable angina, non-ST-elevation myocardial infarction.  SURGEON:  [name], M.D.  ASSISTANT:  [name] Barrett, PA-C.  ANESTHESIA:  General by Dr. Edmond [name].  INDICATIONS:  The patient is a 44-year-old obese diabetic noncompliant female who has had multiple cardiac catheterizations since January of this year.  She has had a stent placed and then revised in the right coronary and has been on chronic Brilinta.  She recently was readmitted with unstable angina and positive cardiac enzymes, and ultimately underwent cardiac catheterization after brief departure from the hospital with AMA and then readmission to the ED with chest pain.  Her cardiologist, Dr. Jonathan [name] felt that surgical revascularization would be her best option for long-term treatment of her severe diffuse coronary disease.  The right coronary had restenosis.  The LAD had high- grade 90% proximal stenosis and diffuse distal disease, the circumflex had severe stenosis of the takeoff of the OM-1 and OM-2.  Her EF was 45% by echo and she had diastolic dysfunction, but no significant  valvular insufficiencies.  I discussed the results of the cardiac catheterization with the patient and the indications and expected benefits of coronary artery bypass surgery for treatment of her severe coronary artery disease.  I reviewed the major issues with the surgery including the use of general anesthesia and cardiopulmonary bypass, the location of the surgical incisions, and the expected postoperative hospital recovery.  I discussed the risks of surgery to [name] including risks of stroke, MI, bleeding, blood transfusion requirement, postoperative pulmonary problems including pleural effusions, infection, and death.  She demonstrated her understanding and agreed to proceed with surgery as planned.  She was kept on IV heparin for a few days to allow Brilinta wash out.  OPERATIVE FINDINGS: 1. Severe diffuse coronary disease, suboptimal targets - the patient     would not be a candidate for redo CABG due to poor targets. 2. Adequate mammary artery adequate, but small vein in the left leg,     which was harvested, poor quality conduit in the right leg vein,     which was not harvested. 3. Intraoperative anemia requiring 2 units of packed cells at the     onset of cardiopulmonary bypass for preoperative anemia.  DESCRIPTION OF PROCEDURE:  The patient was brought to the operating room and placed supine on the operating table where general anesthesia   was induced under invasive hemodynamic monitoring.  The transesophageal echo probe was placed by the anesthesiologist.  The patient was prepped and draped as a sterile field.  A proper time-out was performed.  A sternotomy was performed as the saphenous vein was harvested from the left leg endoscopically.  Attempt was made to visualize the vein in the right leg, but the vein was too small to use.  It was not harvested.  Left internal mammary artery was harvested as a pedicle graft from its origin at the subclavian vessels.  It was  a 1.2-mm vessel with good flow.  The sternal retractor was placed using the deep blades due to the patient's obese body habitus.  The pericardium was opened and suspended. The RV and LV appeared to be somewhat dilated and mildly hypocontractile.  Pursestrings were placed in the ascending aorta and right atrium and heparin was administered.  When the ACT was documented as being therapeutic, the patient was cannulated and placed on cardiopulmonary bypass.  The coronaries were identified for grafting. These were very difficult vessels.  The mammary artery and vein grafts were prepared for the distal anastomoses.  Cardioplegia cannulas were placed for both antegrade aortic and retrograde coronary sinus cardioplegia.  The patient was cooled to 32 degrees and aortic crossclamp was applied.  One liter of cold blood cardioplegia was delivered in split doses between the antegrade aortic and retrograde coronary sinus catheters.  There was good cardioplegic arrest and septal temperature dropped to less than 12 degrees.  Cardioplegia was delivered every 20 minutes.  The distal coronary anastomoses were performed.  The first distal anastomosis was to the posterior descending.  This was a 1-mm vessel. It had a proximal 90% stenosis.  A reverse saphenous vein was sewn end- to-side with running 7-0 Prolene with good flow through the graft. Cardioplegia was redosed.  The second distal anastomosis was the OM-1 high in the AV groove.  This was a small 1.0 mm vessel with proximal 90% stenosis.  A reverse saphenous vein was sewn end-to-side with running 7-0 Prolene with good flow through the graft.  Cardioplegia was redosed.  The third distal anastomosis was the distal third of the LAD.  Towards the apex, the LAD became too small and atretic to revascularize.  The mammary artery and pedicle was brought through an opening and the left lateral pericardium was brought down onto the LAD and sewn  end-to-side with running 8-0 Prolene.  There was good flow through the anastomosis after briefly releasing the pedicle bulldog and the mammary artery.  The bulldog was reapplied and the pedicle was secured to the epicardium with 6-0 Prolene.  Cardioplegia was redosed.  While the crossclamp was still in place, 2 proximal vein anastomoses were performed on the ascending aorta using a 4.0 mm punch and running 6- 0 Prolene.  Prior to tying down the final proximal anastomosis, air was vented from the coronaries with a dose of retrograde warm blood cardioplegia.  The crossclamp was removed.  The heart resumed a spontaneous rhythm.  The vein grafts were open, each had good flow.  Hemostasis was documented at the proximal and distal anastomoses.  The patient was rewarmed and reperfused.  Temporary pacing wires were applied.  The patient was started on low-dose milrinone and dopamine and the ventilator was resumed.  The patient was weaned off cardiopulmonary bypass without difficulty.  Echo showed preserved or even improved LV global function.  Protamine was administered without adverse reaction.  The cannulas were removed.    The patient remained stable.  The superior pericardial fat was closed over the aorta.  The anterior mediastinal and left pleural chest tubes were placed and brought out through separate incisions.  The sternum was closed with interrupted steel wire.  The pectoralis fascia was closed in running #1 Vicryl.  The subcutaneous and skin layers were closed in running Vicryl and sterile dressings were applied.  Total cardiopulmonary bypass time was 122 minutes.     [name], M.D.     PV/MEDQ  D:  02/07/2015  T:  02/08/2015  Job:  [id number] 

## 2015-02-08 NOTE — Progress Notes (Signed)
CRITICAL VALUE ALERT  Critical value received:  troponin-15.26  Date of notification:  02/08/2015  Time of notification:  0651  Critical value read back:Yes.    Nurse who received alert:  Carlos American  MD notified (1st page):  Donata Clay  Time of first page:  [phone]  MD notified (2nd page):  Time of second page:  Responding MD:  Donata Clay  Time MD responded:  [phone]

## 2015-02-09 ENCOUNTER — Inpatient Hospital Stay (HOSPITAL_COMMUNITY): Payer: Medicaid Other

## 2015-02-09 DIAGNOSIS — R570 Cardiogenic shock: Secondary | ICD-10-CM

## 2015-02-09 LAB — COMPREHENSIVE METABOLIC PANEL
ALT: 196 U/L — ABNORMAL HIGH (ref 14–54)
AST: 103 U/L — ABNORMAL HIGH (ref 15–41)
Albumin: 3.4 g/dL — ABNORMAL LOW (ref 3.5–5.0)
Alkaline Phosphatase: 48 U/L (ref 38–126)
Anion gap: 9 (ref 5–15)
BUN: 17 mg/dL (ref 6–20)
CO2: 21 mmol/L — ABNORMAL LOW (ref 22–32)
Calcium: 7.4 mg/dL — ABNORMAL LOW (ref 8.9–10.3)
Chloride: 104 mmol/L (ref 101–111)
Creatinine, Ser: 1.26 mg/dL — ABNORMAL HIGH (ref 0.44–1.00)
GFR calc Af Amer: 60 mL/min — ABNORMAL LOW (ref >60)
GFR calc non Af Amer: 51 mL/min — ABNORMAL LOW (ref >60)
Glucose, Bld: 226 mg/dL — ABNORMAL HIGH (ref 65–99)
Potassium: 4.6 mmol/L (ref 3.5–5.1)
Sodium: 134 mmol/L — ABNORMAL LOW (ref 135–145)
Total Bilirubin: 0.9 mg/dL (ref 0.3–1.2)
Total Protein: 5.1 g/dL — ABNORMAL LOW (ref 6.5–8.1)

## 2015-02-09 LAB — CK TOTAL AND CKMB (NOT AT ARMC)
CK TOTAL: 626 U/L — AB (ref 38–234)
CK, MB: 27 ng/mL — ABNORMAL HIGH (ref 0.5–5.0)
Relative Index: 4.3 — ABNORMAL HIGH (ref 0.0–2.5)

## 2015-02-09 LAB — GLUCOSE, CAPILLARY
GLUCOSE-CAPILLARY: 134 mg/dL — AB (ref 65–99)
GLUCOSE-CAPILLARY: 161 mg/dL — AB (ref 65–99)
GLUCOSE-CAPILLARY: 176 mg/dL — AB (ref 65–99)
GLUCOSE-CAPILLARY: 205 mg/dL — AB (ref 65–99)
GLUCOSE-CAPILLARY: 84 mg/dL (ref 65–99)
Glucose-Capillary: 132 mg/dL — ABNORMAL HIGH (ref 65–99)
Glucose-Capillary: 128 mg/dL — ABNORMAL HIGH (ref 65–99)
Glucose-Capillary: 179 mg/dL — ABNORMAL HIGH (ref 65–99)
Glucose-Capillary: 182 mg/dL — ABNORMAL HIGH (ref 65–99)
Glucose-Capillary: 209 mg/dL — ABNORMAL HIGH (ref 65–99)
Glucose-Capillary: 203 mg/dL — ABNORMAL HIGH (ref 65–99)
Glucose-Capillary: 179 mg/dL — ABNORMAL HIGH (ref 65–99)
Glucose-Capillary: 171 mg/dL — ABNORMAL HIGH (ref 65–99)
Glucose-Capillary: 173 mg/dL — ABNORMAL HIGH (ref 65–99)
Glucose-Capillary: 167 mg/dL — ABNORMAL HIGH (ref 65–99)
Glucose-Capillary: 120 mg/dL — ABNORMAL HIGH (ref 65–99)

## 2015-02-09 LAB — CBC
HCT: 26 % — ABNORMAL LOW (ref 36.0–46.0)
HEMOGLOBIN: 8.9 g/dL — AB (ref 12.0–15.0)
MCH: 29.7 pg (ref 26.0–34.0)
MCHC: 34.2 g/dL (ref 30.0–36.0)
MCV: 86.7 fL (ref 78.0–100.0)
PLATELETS: 93 10*3/uL — AB (ref 150–400)
RBC: 3 MIL/uL — ABNORMAL LOW (ref 3.87–5.11)
RDW: 15.4 % (ref 11.5–15.5)
WBC: 23.5 10*3/uL — ABNORMAL HIGH (ref 4.0–10.5)

## 2015-02-09 LAB — POCT I-STAT, CHEM 8
BUN: 21 mg/dL — AB (ref 6–20)
Calcium, Ion: 1.04 mmol/L — ABNORMAL LOW (ref 1.12–1.23)
Chloride: 99 mmol/L — ABNORMAL LOW (ref 101–111)
Creatinine, Ser: 1 mg/dL (ref 0.44–1.00)
Glucose, Bld: 123 mg/dL — ABNORMAL HIGH (ref 65–99)
HEMATOCRIT: 30 % — AB (ref 36.0–46.0)
Hemoglobin: 10.2 g/dL — ABNORMAL LOW (ref 12.0–15.0)
Potassium: 4.2 mmol/L (ref 3.5–5.1)
SODIUM: 135 mmol/L (ref 135–145)
TCO2: 21 mmol/L (ref 0–100)

## 2015-02-09 LAB — CARBOXYHEMOGLOBIN
Carboxyhemoglobin: 1 % (ref 0.5–1.5)
Methemoglobin: 1 % (ref 0.0–1.5)
O2 Saturation: 54.3 %
Total hemoglobin: 7.8 g/dL — ABNORMAL LOW (ref 12.0–16.0)

## 2015-02-09 LAB — HEPARIN LEVEL (UNFRACTIONATED): Heparin Unfractionated: 0.38 IU/mL (ref 0.30–0.70)

## 2015-02-09 LAB — TROPONIN I: Troponin I: 4.87 ng/mL (ref <0.031)

## 2015-02-09 MED ORDER — FUROSEMIDE 10 MG/ML IJ SOLN
4.0000 mg/h | INTRAVENOUS | Status: DC
  Administered 2015-02-09 – 2015-02-10 (×2): 8 mg/h via INTRAVENOUS
  Administered 2015-02-11: 6 mg/h via INTRAVENOUS
  Filled 2015-02-09 (×5): qty 25

## 2015-02-09 MED ORDER — MORPHINE SULFATE (PF) 2 MG/ML IV SOLN
2.0000 mg | INTRAVENOUS | Status: DC | PRN
  Administered 2015-02-09 – 2015-02-11 (×4): 2 mg via INTRAVENOUS
  Filled 2015-02-09 (×4): qty 1

## 2015-02-09 MED ORDER — VANCOMYCIN HCL 10 G IV SOLR
1250.0000 mg | Freq: Once | INTRAVENOUS | Status: AC
  Administered 2015-02-09: 1250 mg via INTRAVENOUS
  Filled 2015-02-09: qty 1250

## 2015-02-09 MED ORDER — METOCLOPRAMIDE HCL 5 MG/ML IJ SOLN
10.0000 mg | Freq: Four times a day (QID) | INTRAMUSCULAR | Status: AC
  Administered 2015-02-10 – 2015-02-11 (×6): 10 mg via INTRAVENOUS
  Filled 2015-02-09 (×6): qty 2

## 2015-02-09 MED ORDER — PROMETHAZINE HCL 25 MG/ML IJ SOLN
12.5000 mg | Freq: Four times a day (QID) | INTRAMUSCULAR | Status: DC | PRN
  Administered 2015-02-09: 12.5 mg via INTRAVENOUS
  Filled 2015-02-09: qty 1

## 2015-02-09 MED ORDER — HEPARIN (PORCINE) IN NACL 100-0.45 UNIT/ML-% IJ SOLN
450.0000 [IU]/h | INTRAMUSCULAR | Status: DC
  Administered 2015-02-09: 500 [IU]/h via INTRAVENOUS
  Filled 2015-02-09: qty 250

## 2015-02-09 NOTE — Progress Notes (Addendum)
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 Extubated. On milrinone, epi 5 and levophed. IABP in left groin. MAPs good. Denies CP. Hemodynamics look good. Co-ox 54%. Creatinine up 0.9 -> 1.2. Diuresed well with first dose of lasix but now trailing off. Weight still up 25 pounds.   PA 30/20 Thermo 4.2/2.2  Objective:  Temp:  [96.3 F (35.7 C)-99.1 F (37.3 C)] 97.3 F (36.3 C) (10/01 0800) Pulse Rate:  [43-110] 86 (10/01 0800) Resp:  [6-26] 16 (10/01 0800) BP: (84-140)/(36-86) 111/48 mmHg (10/01 0800) SpO2:  [98 %-100 %] 99 % (10/01 0800) Arterial Line BP: (71-294)/(43-289) 150/82 mmHg (10/01 0800) FiO2 (%):  [40 %] 40 % (09/30 1200) Weight:  [87.8 kg (193 lb 9 oz)] 87.8 kg (193 lb 9 oz) (10/01 0600) Weight change: -2.3 kg (-5 lb 1.1 oz)  Intake/Output from previous day: 09/30 0701 - 10/01 0700 In: 3884.5 [P.O.:920; I.V.:2314.5; NG/GT:70; IV Piggyback:580] Out: 1300 [Urine:560; Emesis/NG output:300; Chest Tube:440]  Intake/Output from this shift: Total I/O In: 583.4 [I.V.:583.4] Out: 65 [Urine:45; Chest Tube:20]  Physical Exam: General appearance: awake. conversant Neck: supple RIJ swan Lungs: clear Heart: regular rate and rhythm, S1, S2 normal, no murmur, click, rub or gallop Extremities: extremities normal, atraumatic, no cyanosisand IABP on Left, 1+ LPP. Mild edema.   Lab Results: Results for orders placed or performed during the hospital encounter of 02/01/15 (from the past 48 hour(s))  I-STAT glucose     Status: None   Collection Time: 02/07/15  9:23 AM  Result Value Ref Range   Operator id [id number]    Glucose, Bld 85 65 - 99 mg/dL  Prepare RBC (crossmatch)     Status: None   Collection Time: 02/07/15 10:14 AM  Result Value Ref Range   Order Confirmation ORDER PROCESSED BY BLOOD BANK   I-STAT, chem 8     Status: Abnormal   Collection Time: 02/07/15 10:23 AM  Result Value Ref Range   Sodium 140 135 -  145 mmol/L   Potassium 3.9 3.5 - 5.1 mmol/L   Chloride 107 101 - 111 mmol/L   BUN 6 6 - 20 mg/dL   Creatinine, Ser 0.40 (L) 0.44 - 1.00 mg/dL   Glucose, Bld 83 65 - 99 mg/dL   Calcium, Ion 1.22 1.12 - 1.23 mmol/L   TCO2 27 0 - 100 mmol/L   Hemoglobin 7.8 (L) 12.0 - 15.0 g/dL   HCT 23.0 (L) 36.0 - 46.0 %  I-STAT, chem 8     Status: Abnormal   Collection Time: 02/07/15 10:42 AM  Result Value Ref Range   Sodium 138 135 - 145 mmol/L   Potassium 4.1 3.5 - 5.1 mmol/L   Chloride 101 101 - 111 mmol/L   BUN 5 (L) 6 - 20 mg/dL   Creatinine, Ser 0.40 (L) 0.44 - 1.00 mg/dL   Glucose, Bld 112 (H) 65 - 99 mg/dL   Calcium, Ion 1.00 (L) 1.12 - 1.23 mmol/L   TCO2 25 0 - 100 mmol/L   Hemoglobin 8.8 (L) 12.0 - 15.0 g/dL   HCT 26.0 (L) 36.0 - 46.0 %  I-STAT 3, arterial blood gas (G3+)     Status: Abnormal   Collection Time: 02/07/15 10:46 AM  Result Value Ref Range   pH, Arterial 7.413 7.350 - 7.450   pCO2 arterial 38.8 35.0 - 45.0 mmHg   pO2, Arterial 333.0 (H) 80.0 - 100.0 mmHg   Bicarbonate 24.8 (H)  20.0 - 24.0 mEq/L   TCO2 26 0 - 100 mmol/L   O2 Saturation 100.0 %   Patient temperature HIDE    Sample type ARTERIAL   I-STAT glucose     Status: None   Collection Time: 02/07/15 11:47 AM  Result Value Ref Range   Operator id [id number]    Glucose, Bld 83 65 - 99 mg/dL  Prepare Pheresed Platelets     Status: None   Collection Time: 02/07/15 12:00 PM  Result Value Ref Range   Unit Number [id number]    Blood Component Type PLTP LR1 PAS    Unit division 00    Status of Unit ISSUED,FINAL    Transfusion Status OK TO TRANSFUSE   Hemoglobin and hematocrit, blood     Status: Abnormal   Collection Time: 02/07/15 12:00 PM  Result Value Ref Range   Hemoglobin 8.5 (L) 12.0 - 15.0 g/dL    Comment: RESULT CALLED TO, READ BACK BY AND VERIFIED WITH: N.CRANSTON,RN 02/07/15 '@1212'  BY V.WILKINS    HCT 25.2 (L) 36.0 - 46.0 %    Comment: RESULT CALLED TO, READ BACK BY AND VERIFIED WITH: N.CRANSTON,RN  02/07/15 '@1212'  BY V.WILKINS   Platelet count     Status: Abnormal   Collection Time: 02/07/15 12:00 PM  Result Value Ref Range   Platelets 86 (L) 150 - 400 K/uL    Comment: RESULT CALLED TO, READ BACK BY AND VERIFIED WITH: N.CRANSTON,RN 02/07/15 '@1212'  BY V.WILKINS PLATELET COUNT CONFIRMED BY SMEAR   Prepare fresh frozen plasma     Status: None   Collection Time: 02/07/15 12:01 PM  Result Value Ref Range   Unit Number [id number]    Blood Component Type THAWED PLASMA    Unit division 00    Status of Unit ISSUED,FINAL    Transfusion Status OK TO TRANSFUSE   I-STAT, chem 8     Status: Abnormal   Collection Time: 02/07/15 12:06 PM  Result Value Ref Range   Sodium 140 135 - 145 mmol/L   Potassium 4.8 3.5 - 5.1 mmol/L   Chloride 106 101 - 111 mmol/L   BUN 5 (L) 6 - 20 mg/dL   Creatinine, Ser 0.40 (L) 0.44 - 1.00 mg/dL   Glucose, Bld 85 65 - 99 mg/dL   Calcium, Ion 1.00 (L) 1.12 - 1.23 mmol/L   TCO2 25 0 - 100 mmol/L   Hemoglobin 8.8 (L) 12.0 - 15.0 g/dL   HCT 26.0 (L) 36.0 - 46.0 %  I-STAT, chem 8     Status: Abnormal   Collection Time: 02/07/15  1:01 PM  Result Value Ref Range   Sodium 141 135 - 145 mmol/L   Potassium 4.3 3.5 - 5.1 mmol/L   Chloride 106 101 - 111 mmol/L   BUN 4 (L) 6 - 20 mg/dL   Creatinine, Ser 0.40 (L) 0.44 - 1.00 mg/dL   Glucose, Bld 149 (H) 65 - 99 mg/dL   Calcium, Ion 1.05 (L) 1.12 - 1.23 mmol/L   TCO2 23 0 - 100 mmol/L   Hemoglobin 8.2 (L) 12.0 - 15.0 g/dL   HCT 24.0 (L) 36.0 - 46.0 %  I-STAT 3, arterial blood gas (G3+)     Status: Abnormal   Collection Time: 02/07/15  1:05 PM  Result Value Ref Range   pH, Arterial 7.432 7.350 - 7.450   pCO2 arterial 34.3 (L) 35.0 - 45.0 mmHg   pO2, Arterial 257.0 (H) 80.0 - 100.0 mmHg   Bicarbonate 22.8 20.0 - 24.0 mEq/L  TCO2 24 0 - 100 mmol/L   O2 Saturation 100.0 %   Acid-base deficit 1.0 0.0 - 2.0 mmol/L   Patient temperature HIDE    Sample type ARTERIAL   CBC     Status: Abnormal   Collection Time:  02/07/15  2:23 PM  Result Value Ref Range   WBC 13.6 (H) 4.0 - 10.5 K/uL   RBC 4.26 3.87 - 5.11 MIL/uL   Hemoglobin 12.7 12.0 - 15.0 g/dL    Comment: REPEATED TO VERIFY POST TRANSFUSION SPECIMEN    HCT 36.5 36.0 - 46.0 %   MCV 85.7 78.0 - 100.0 fL   MCH 29.8 26.0 - 34.0 pg   MCHC 34.8 30.0 - 36.0 g/dL   RDW 14.2 11.5 - 15.5 %   Platelets 113 (L) 150 - 400 K/uL    Comment: PLATELET COUNT CONFIRMED BY SMEAR REPEATED TO VERIFY   Protime-INR     Status: Abnormal   Collection Time: 02/07/15  2:23 PM  Result Value Ref Range   Prothrombin Time 19.1 (H) 11.6 - 15.2 seconds   INR 1.60 (H) 0.00 - 1.49  APTT     Status: Abnormal   Collection Time: 02/07/15  2:23 PM  Result Value Ref Range   aPTT 38 (H) 24 - 37 seconds    Comment:        IF BASELINE aPTT IS ELEVATED, SUGGEST PATIENT RISK ASSESSMENT BE USED TO DETERMINE APPROPRIATE ANTICOAGULANT THERAPY.   I-STAT 4, (NA,K, GLUC, HGB,HCT)     Status: Abnormal   Collection Time: 02/07/15  2:29 PM  Result Value Ref Range   Sodium 144 135 - 145 mmol/L   Potassium 3.8 3.5 - 5.1 mmol/L   Glucose, Bld 150 (H) 65 - 99 mg/dL   HCT 39.0 36.0 - 46.0 %   Hemoglobin 13.3 12.0 - 15.0 g/dL  I-STAT 3, arterial blood gas (G3+)     Status: Abnormal   Collection Time: 02/07/15  2:34 PM  Result Value Ref Range   pH, Arterial 7.421 7.350 - 7.450   pCO2 arterial 31.5 (L) 35.0 - 45.0 mmHg   pO2, Arterial 45.0 (L) 80.0 - 100.0 mmHg   Bicarbonate 21.1 20.0 - 24.0 mEq/L   TCO2 22 0 - 100 mmol/L   O2 Saturation 88.0 %   Acid-base deficit 4.0 (H) 0.0 - 2.0 mmol/L   Patient temperature 34.2 C    Sample type ARTERIAL   Glucose, capillary     Status: Abnormal   Collection Time: 02/07/15  4:00 PM  Result Value Ref Range   Glucose-Capillary 131 (H) 65 - 99 mg/dL  Glucose, capillary     Status: Abnormal   Collection Time: 02/07/15  5:22 PM  Result Value Ref Range   Glucose-Capillary 130 (H) 65 - 99 mg/dL  I-STAT 3, arterial blood gas (G3+)     Status:  Abnormal   Collection Time: 02/07/15  5:46 PM  Result Value Ref Range   pH, Arterial 7.254 (L) 7.350 - 7.450   pCO2 arterial 38.8 35.0 - 45.0 mmHg   pO2, Arterial 73.0 (L) 80.0 - 100.0 mmHg   Bicarbonate 17.2 (L) 20.0 - 24.0 mEq/L   TCO2 18 0 - 100 mmol/L   O2 Saturation 92.0 %   Acid-base deficit 9.0 (H) 0.0 - 2.0 mmol/L   Patient temperature 37.0 C    Sample type ARTERIAL   I-STAT 3, arterial blood gas (G3+)     Status: Abnormal   Collection Time: 02/07/15  6:08 PM  Result Value Ref Range   pH, Arterial 7.369 7.350 - 7.450   pCO2 arterial 39.5 35.0 - 45.0 mmHg   pO2, Arterial 72.0 (L) 80.0 - 100.0 mmHg   Bicarbonate 22.8 20.0 - 24.0 mEq/L   TCO2 24 0 - 100 mmol/L   O2 Saturation 94.0 %   Acid-base deficit 2.0 0.0 - 2.0 mmol/L   Patient temperature 36.8 C    Collection site ARTERIAL LINE    Drawn by Nurse    Sample type ARTERIAL   I-STAT, chem 8     Status: Abnormal   Collection Time: 02/07/15  6:15 PM  Result Value Ref Range   Sodium 142 135 - 145 mmol/L   Potassium 4.1 3.5 - 5.1 mmol/L   Chloride 107 101 - 111 mmol/L   BUN 6 6 - 20 mg/dL   Creatinine, Ser 0.60 0.44 - 1.00 mg/dL   Glucose, Bld 283 (H) 65 - 99 mg/dL   Calcium, Ion 1.06 (L) 1.12 - 1.23 mmol/L   TCO2 20 0 - 100 mmol/L   Hemoglobin 10.5 (L) 12.0 - 15.0 g/dL   HCT 31.0 (L) 36.0 - 46.0 %  I-STAT 3, arterial blood gas (G3+)     Status: Abnormal   Collection Time: 02/07/15  6:58 PM  Result Value Ref Range   pH, Arterial 7.301 (L) 7.350 - 7.450   pCO2 arterial 41.4 35.0 - 45.0 mmHg   pO2, Arterial 102.0 (H) 80.0 - 100.0 mmHg   Bicarbonate 20.4 20.0 - 24.0 mEq/L   TCO2 22 0 - 100 mmol/L   O2 Saturation 97.0 %   Acid-base deficit 6.0 (H) 0.0 - 2.0 mmol/L   Patient temperature 37.2 C    Collection site ARTERIAL LINE    Drawn by Nurse    Sample type ARTERIAL   Glucose, capillary     Status: Abnormal   Collection Time: 02/07/15  7:20 PM  Result Value Ref Range   Glucose-Capillary 264 (H) 65 - 99 mg/dL    Comment 1 Arterial Specimen   I-STAT 3, arterial blood gas (G3+)     Status: Abnormal   Collection Time: 02/07/15  8:22 PM  Result Value Ref Range   pH, Arterial 7.444 7.350 - 7.450   pCO2 arterial 35.2 35.0 - 45.0 mmHg   pO2, Arterial 359.0 (H) 80.0 - 100.0 mmHg   Bicarbonate 24.1 (H) 20.0 - 24.0 mEq/L   TCO2 25 0 - 100 mmol/L   O2 Saturation 100.0 %   Patient temperature 37.1 C    Sample type ARTERIAL   Glucose, capillary     Status: Abnormal   Collection Time: 02/07/15  8:23 PM  Result Value Ref Range   Glucose-Capillary 228 (H) 65 - 99 mg/dL   Comment 1 Arterial Specimen   Carboxyhemoglobin     Status: Abnormal   Collection Time: 02/07/15  9:30 PM  Result Value Ref Range   Total hemoglobin 10.7 (L) 12.0 - 16.0 g/dL   O2 Saturation 56.9 %   Carboxyhemoglobin 0.8 0.5 - 1.5 %   Methemoglobin 0.9 0.0 - 1.5 %  Glucose, capillary     Status: Abnormal   Collection Time: 02/07/15  9:51 PM  Result Value Ref Range   Glucose-Capillary 252 (H) 65 - 99 mg/dL   Comment 1 Arterial Specimen   Glucose, capillary     Status: Abnormal   Collection Time: 02/07/15 10:58 PM  Result Value Ref Range   Glucose-Capillary 179 (H) 65 - 99 mg/dL   Comment  1 Arterial Specimen   Carboxyhemoglobin     Status: Abnormal   Collection Time: 02/07/15 11:48 PM  Result Value Ref Range   Total hemoglobin 11.2 (L) 12.0 - 16.0 g/dL   O2 Saturation 44.5 %   Carboxyhemoglobin 0.8 0.5 - 1.5 %   Methemoglobin 0.8 0.0 - 1.5 %  Glucose, capillary     Status: Abnormal   Collection Time: 02/07/15 11:59 PM  Result Value Ref Range   Glucose-Capillary 217 (H) 65 - 99 mg/dL   Comment 1 Arterial Specimen   I-STAT 3, arterial blood gas (G3+)     Status: Abnormal   Collection Time: 02/08/15 12:00 AM  Result Value Ref Range   pH, Arterial 7.434 7.350 - 7.450   pCO2 arterial 32.3 (L) 35.0 - 45.0 mmHg   pO2, Arterial 210.0 (H) 80.0 - 100.0 mmHg   Bicarbonate 21.6 20.0 - 24.0 mEq/L   TCO2 23 0 - 100 mmol/L   O2  Saturation 100.0 %   Acid-base deficit 2.0 0.0 - 2.0 mmol/L   Patient temperature 37.3 C    Sample type ARTERIAL   Glucose, capillary     Status: Abnormal   Collection Time: 02/08/15  1:07 AM  Result Value Ref Range   Glucose-Capillary 188 (H) 65 - 99 mg/dL   Comment 1 Arterial Specimen   Glucose, capillary     Status: Abnormal   Collection Time: 02/08/15  2:03 AM  Result Value Ref Range   Glucose-Capillary 139 (H) 65 - 99 mg/dL   Comment 1 Arterial Specimen   Glucose, capillary     Status: Abnormal   Collection Time: 02/08/15  3:03 AM  Result Value Ref Range   Glucose-Capillary 185 (H) 65 - 99 mg/dL   Comment 1 Arterial Specimen   Carboxyhemoglobin     Status: None   Collection Time: 02/08/15  4:00 AM  Result Value Ref Range   Total hemoglobin 12.1 12.0 - 16.0 g/dL   O2 Saturation 64.0 %   Carboxyhemoglobin 1.0 0.5 - 1.5 %   Methemoglobin 1.0 0.0 - 1.5 %  Glucose, capillary     Status: Abnormal   Collection Time: 02/08/15  4:19 AM  Result Value Ref Range   Glucose-Capillary 188 (H) 65 - 99 mg/dL   Comment 1 Arterial Specimen   I-STAT 3, arterial blood gas (G3+)     Status: Abnormal   Collection Time: 02/08/15  4:23 AM  Result Value Ref Range   pH, Arterial 7.414 7.350 - 7.450   pCO2 arterial 31.9 (L) 35.0 - 45.0 mmHg   pO2, Arterial 141.0 (H) 80.0 - 100.0 mmHg   Bicarbonate 20.4 20.0 - 24.0 mEq/L   TCO2 21 0 - 100 mmol/L   O2 Saturation 99.0 %   Acid-base deficit 3.0 (H) 0.0 - 2.0 mmol/L   Patient temperature 36.8 C    Sample type ARTERIAL   CBC     Status: Abnormal   Collection Time: 02/08/15  4:38 AM  Result Value Ref Range   WBC 15.6 (H) 4.0 - 10.5 K/uL   RBC 4.20 3.87 - 5.11 MIL/uL   Hemoglobin 12.2 12.0 - 15.0 g/dL   HCT 35.5 (L) 36.0 - 46.0 %   MCV 84.5 78.0 - 100.0 fL   MCH 29.0 26.0 - 34.0 pg   MCHC 34.4 30.0 - 36.0 g/dL   RDW 14.5 11.5 - 15.5 %   Platelets 144 (L) 150 - 400 K/uL  Magnesium     Status: None  Collection Time: 02/08/15  4:38 AM  Result  Value Ref Range   Magnesium 2.1 1.7 - 2.4 mg/dL  Troponin I     Status: Abnormal   Collection Time: 02/08/15  4:38 AM  Result Value Ref Range   Troponin I 15.26 (HH) <0.031 ng/mL    Comment:        POSSIBLE MYOCARDIAL ISCHEMIA. SERIAL TESTING RECOMMENDED. CRITICAL RESULT CALLED TO, READ BACK BY AND VERIFIED WITH: Koren Bound RN 02/08/15 0650 COSTELLO B   Comprehensive metabolic panel     Status: Abnormal   Collection Time: 02/08/15  5:00 AM  Result Value Ref Range   Sodium 139 135 - 145 mmol/L   Potassium 3.2 (L) 3.5 - 5.1 mmol/L    Comment: DELTA CHECK NOTED   Chloride 109 101 - 111 mmol/L   CO2 21 (L) 22 - 32 mmol/L   Glucose, Bld 214 (H) 65 - 99 mg/dL   BUN 9 6 - 20 mg/dL   Creatinine, Ser 1.01 (H) 0.44 - 1.00 mg/dL   Calcium 7.6 (L) 8.9 - 10.3 mg/dL   Total Protein 5.0 (L) 6.5 - 8.1 g/dL   Albumin 3.7 3.5 - 5.0 g/dL   AST 487 (H) 15 - 41 U/L   ALT 361 (H) 14 - 54 U/L   Alkaline Phosphatase 57 38 - 126 U/L   Total Bilirubin 1.5 (H) 0.3 - 1.2 mg/dL   GFR calc non Af Amer >60 >60 mL/min   GFR calc Af Amer >60 >60 mL/min    Comment: (NOTE) The eGFR has been calculated using the CKD EPI equation. This calculation has not been validated in all clinical situations. eGFR's persistently <60 mL/min signify possible Chronic Kidney Disease.    Anion gap 9 5 - 15  Glucose, capillary     Status: Abnormal   Collection Time: 02/08/15  5:13 AM  Result Value Ref Range   Glucose-Capillary 169 (H) 65 - 99 mg/dL   Comment 1 Arterial Specimen   Glucose, capillary     Status: Abnormal   Collection Time: 02/08/15  6:16 AM  Result Value Ref Range   Glucose-Capillary 179 (H) 65 - 99 mg/dL   Comment 1 Arterial Specimen   Glucose, capillary     Status: Abnormal   Collection Time: 02/08/15  6:47 AM  Result Value Ref Range   Glucose-Capillary 166 (H) 65 - 99 mg/dL   Comment 1 Arterial Specimen   Glucose, capillary     Status: Abnormal   Collection Time: 02/08/15  8:06 AM  Result Value Ref  Range   Glucose-Capillary 168 (H) 65 - 99 mg/dL  Glucose, capillary     Status: Abnormal   Collection Time: 02/08/15  8:58 AM  Result Value Ref Range   Glucose-Capillary 184 (H) 65 - 99 mg/dL  Glucose, capillary     Status: Abnormal   Collection Time: 02/08/15 10:00 AM  Result Value Ref Range   Glucose-Capillary 183 (H) 65 - 99 mg/dL  Glucose, capillary     Status: Abnormal   Collection Time: 02/08/15 11:08 AM  Result Value Ref Range   Glucose-Capillary 177 (H) 65 - 99 mg/dL  Glucose, capillary     Status: Abnormal   Collection Time: 02/08/15 12:05 PM  Result Value Ref Range   Glucose-Capillary 154 (H) 65 - 99 mg/dL  I-STAT 3, arterial blood gas (G3+)     Status: Abnormal   Collection Time: 02/08/15 12:42 PM  Result Value Ref Range   pH, Arterial 7.356  7.350 - 7.450   pCO2 arterial 37.5 35.0 - 45.0 mmHg   pO2, Arterial 82.0 80.0 - 100.0 mmHg   Bicarbonate 21.2 20.0 - 24.0 mEq/L   TCO2 22 0 - 100 mmol/L   O2 Saturation 96.0 %   Acid-base deficit 4.0 (H) 0.0 - 2.0 mmol/L   Patient temperature 36.1 C    Sample type ARTERIAL   Glucose, capillary     Status: Abnormal   Collection Time: 02/08/15 12:43 PM  Result Value Ref Range   Glucose-Capillary 166 (H) 65 - 99 mg/dL  Glucose, capillary     Status: Abnormal   Collection Time: 02/08/15  2:26 PM  Result Value Ref Range   Glucose-Capillary 152 (H) 65 - 99 mg/dL  I-STAT 3, arterial blood gas (G3+)     Status: Abnormal   Collection Time: 02/08/15  2:27 PM  Result Value Ref Range   pH, Arterial 7.389 7.350 - 7.450   pCO2 arterial 35.2 35.0 - 45.0 mmHg   pO2, Arterial 81.0 80.0 - 100.0 mmHg   Bicarbonate 21.3 20.0 - 24.0 mEq/L   TCO2 22 0 - 100 mmol/L   O2 Saturation 96.0 %   Acid-base deficit 3.0 (H) 0.0 - 2.0 mmol/L   Patient temperature 36.7 C    Sample type ARTERIAL   Glucose, capillary     Status: Abnormal   Collection Time: 02/08/15  3:39 PM  Result Value Ref Range   Glucose-Capillary 153 (H) 65 - 99 mg/dL  Glucose,  capillary     Status: Abnormal   Collection Time: 02/08/15  4:13 PM  Result Value Ref Range   Glucose-Capillary 142 (H) 65 - 99 mg/dL  I-STAT 3, arterial blood gas (G3+)     Status: Abnormal   Collection Time: 02/08/15  4:14 PM  Result Value Ref Range   pH, Arterial 7.350 7.350 - 7.450   pCO2 arterial 34.5 (L) 35.0 - 45.0 mmHg   pO2, Arterial 84.0 80.0 - 100.0 mmHg   Bicarbonate 19.1 (L) 20.0 - 24.0 mEq/L   TCO2 20 0 - 100 mmol/L   O2 Saturation 96.0 %   Acid-base deficit 6.0 (H) 0.0 - 2.0 mmol/L   Patient temperature 36.8 C    Sample type ARTERIAL   Magnesium     Status: None   Collection Time: 02/08/15  4:15 PM  Result Value Ref Range   Magnesium 1.9 1.7 - 2.4 mg/dL  CBC     Status: Abnormal   Collection Time: 02/08/15  4:15 PM  Result Value Ref Range   WBC 20.4 (H) 4.0 - 10.5 K/uL   RBC 3.27 (L) 3.87 - 5.11 MIL/uL   Hemoglobin 9.4 (L) 12.0 - 15.0 g/dL    Comment: REPEATED TO VERIFY   HCT 28.0 (L) 36.0 - 46.0 %   MCV 85.6 78.0 - 100.0 fL   MCH 28.7 26.0 - 34.0 pg   MCHC 33.6 30.0 - 36.0 g/dL   RDW 14.8 11.5 - 15.5 %   Platelets 99 (L) 150 - 400 K/uL  Creatinine, serum     Status: None   Collection Time: 02/08/15  4:15 PM  Result Value Ref Range   Creatinine, Ser 0.97 0.44 - 1.00 mg/dL   GFR calc non Af Amer >60 >60 mL/min   GFR calc Af Amer >60 >60 mL/min    Comment: (NOTE) The eGFR has been calculated using the CKD EPI equation. This calculation has not been validated in all clinical situations. eGFR's persistently <60 mL/min signify  possible Chronic Kidney Disease.   I-STAT, chem 8     Status: Abnormal   Collection Time: 02/08/15  4:17 PM  Result Value Ref Range   Sodium 141 135 - 145 mmol/L   Potassium 3.8 3.5 - 5.1 mmol/L   Chloride 105 101 - 111 mmol/L   BUN 13 6 - 20 mg/dL   Creatinine, Ser 0.80 0.44 - 1.00 mg/dL   Glucose, Bld 147 (H) 65 - 99 mg/dL   Calcium, Ion 1.07 (L) 1.12 - 1.23 mmol/L   TCO2 18 0 - 100 mmol/L   Hemoglobin 10.9 (L) 12.0 - 15.0  g/dL   HCT 32.0 (L) 36.0 - 46.0 %  Glucose, capillary     Status: Abnormal   Collection Time: 02/08/15  5:06 PM  Result Value Ref Range   Glucose-Capillary 142 (H) 65 - 99 mg/dL  Glucose, capillary     Status: Abnormal   Collection Time: 02/08/15  6:18 PM  Result Value Ref Range   Glucose-Capillary 136 (H) 65 - 99 mg/dL  Glucose, capillary     Status: Abnormal   Collection Time: 02/08/15  6:52 PM  Result Value Ref Range   Glucose-Capillary 129 (H) 65 - 99 mg/dL  Glucose, capillary     Status: Abnormal   Collection Time: 02/08/15  7:57 PM  Result Value Ref Range   Glucose-Capillary 130 (H) 65 - 99 mg/dL   Comment 1 Arterial Specimen   Glucose, capillary     Status: Abnormal   Collection Time: 02/08/15  9:04 PM  Result Value Ref Range   Glucose-Capillary 135 (H) 65 - 99 mg/dL   Comment 1 Capillary Specimen   Glucose, capillary     Status: Abnormal   Collection Time: 02/08/15  9:59 PM  Result Value Ref Range   Glucose-Capillary 132 (H) 65 - 99 mg/dL   Comment 1 Capillary Specimen   Glucose, capillary     Status: Abnormal   Collection Time: 02/08/15 11:00 PM  Result Value Ref Range   Glucose-Capillary 128 (H) 65 - 99 mg/dL   Comment 1 Capillary Specimen   Glucose, capillary     Status: Abnormal   Collection Time: 02/09/15 12:08 AM  Result Value Ref Range   Glucose-Capillary 176 (H) 65 - 99 mg/dL   Comment 1 Arterial Specimen   Glucose, capillary     Status: Abnormal   Collection Time: 02/09/15  1:12 AM  Result Value Ref Range   Glucose-Capillary 179 (H) 65 - 99 mg/dL   Comment 1 Arterial Specimen   Glucose, capillary     Status: Abnormal   Collection Time: 02/09/15  2:01 AM  Result Value Ref Range   Glucose-Capillary 161 (H) 65 - 99 mg/dL   Comment 1 Capillary Specimen   Glucose, capillary     Status: Abnormal   Collection Time: 02/09/15  2:59 AM  Result Value Ref Range   Glucose-Capillary 182 (H) 65 - 99 mg/dL   Comment 1 Capillary Specimen   Glucose, capillary      Status: Abnormal   Collection Time: 02/09/15  4:05 AM  Result Value Ref Range   Glucose-Capillary 209 (H) 65 - 99 mg/dL   Comment 1 Capillary Specimen   Comprehensive metabolic panel     Status: Abnormal   Collection Time: 02/09/15  4:20 AM  Result Value Ref Range   Sodium 134 (L) 135 - 145 mmol/L    Comment: DELTA CHECK NOTED   Potassium 4.6 3.5 - 5.1 mmol/L  Comment: DELTA CHECK NOTED   Chloride 104 101 - 111 mmol/L   CO2 21 (L) 22 - 32 mmol/L   Glucose, Bld 226 (H) 65 - 99 mg/dL   BUN 17 6 - 20 mg/dL   Creatinine, Ser 1.26 (H) 0.44 - 1.00 mg/dL   Calcium 7.4 (L) 8.9 - 10.3 mg/dL   Total Protein 5.1 (L) 6.5 - 8.1 g/dL   Albumin 3.4 (L) 3.5 - 5.0 g/dL   AST 103 (H) 15 - 41 U/L   ALT 196 (H) 14 - 54 U/L   Alkaline Phosphatase 48 38 - 126 U/L   Total Bilirubin 0.9 0.3 - 1.2 mg/dL   GFR calc non Af Amer 51 (L) >60 mL/min   GFR calc Af Amer 60 (L) >60 mL/min    Comment: (NOTE) The eGFR has been calculated using the CKD EPI equation. This calculation has not been validated in all clinical situations. eGFR's persistently <60 mL/min signify possible Chronic Kidney Disease.    Anion gap 9 5 - 15  Troponin I     Status: Abnormal   Collection Time: 02/09/15  4:20 AM  Result Value Ref Range   Troponin I 4.87 (HH) <0.031 ng/mL    Comment:        POSSIBLE MYOCARDIAL ISCHEMIA. SERIAL TESTING RECOMMENDED. CRITICAL VALUE NOTED.  VALUE IS CONSISTENT WITH PREVIOUSLY REPORTED AND CALLED VALUE.   CBC     Status: Abnormal   Collection Time: 02/09/15  4:20 AM  Result Value Ref Range   WBC 23.5 (H) 4.0 - 10.5 K/uL   RBC 3.00 (L) 3.87 - 5.11 MIL/uL   Hemoglobin 8.9 (L) 12.0 - 15.0 g/dL   HCT 26.0 (L) 36.0 - 46.0 %   MCV 86.7 78.0 - 100.0 fL   MCH 29.7 26.0 - 34.0 pg   MCHC 34.2 30.0 - 36.0 g/dL   RDW 15.4 11.5 - 15.5 %   Platelets 93 (L) 150 - 400 K/uL    Comment: CONSISTENT WITH PREVIOUS RESULT  CK total and CKMB (cardiac)not at [facility]     Status: Abnormal   Collection Time:  02/09/15  4:20 AM  Result Value Ref Range   Total CK 626 (H) 38 - 234 U/L   CK, MB 27.0 (H) 0.5 - 5.0 ng/mL   Relative Index 4.3 (H) 0.0 - 2.5  Carboxyhemoglobin     Status: Abnormal   Collection Time: 02/09/15  4:25 AM  Result Value Ref Range   Total hemoglobin 7.8 (L) 12.0 - 16.0 g/dL   O2 Saturation 54.3 %   Carboxyhemoglobin 1.0 0.5 - 1.5 %   Methemoglobin 1.0 0.0 - 1.5 %  Glucose, capillary     Status: Abnormal   Collection Time: 02/09/15  5:12 AM  Result Value Ref Range   Glucose-Capillary 203 (H) 65 - 99 mg/dL   Comment 1 Capillary Specimen   Glucose, capillary     Status: Abnormal   Collection Time: 02/09/15  6:04 AM  Result Value Ref Range   Glucose-Capillary 205 (H) 65 - 99 mg/dL   Comment 1 Capillary Specimen   Glucose, capillary     Status: Abnormal   Collection Time: 02/09/15  6:59 AM  Result Value Ref Range   Glucose-Capillary 179 (H) 65 - 99 mg/dL   Comment 1 Capillary Specimen     Imaging: Imaging results have been reviewed  Assessment/Plan:   1. Cardiogenic shock 2. Cardiac arrest 3. CAD with NSTEMI    -s/p CABG 9/29 4. Diabetes 5.  Ventricular tachycardia  6. 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 Acute blood loss anemia  Improved but remains very tenuous. Wean norepi slowly. Will chang to lasix gtt at 8. Would leave IABP in at least another day. Ideally would like to start heparin at least at low-dose with IABP in. Will discuss with TCTS.   WBC climbing. Multiple emergent procedures the other night. Will add vanc for now.   The patient is critically ill with multiple organ systems failure and requires high complexity decision making for assessment and support, frequent evaluation and titration of therapies, application of advanced monitoring technologies and extensive interpretation of multiple databases.   Critical Care Time devoted to patient care services described in this note is 35 Minutes.   Glori Bickers MD 02/09/2015, 8:31 AM

## 2015-02-09 NOTE — Progress Notes (Signed)
ANTICOAGULATION CONSULT NOTE  Pharmacy Consult for Heparin Indication:  IABP  No Known Allergies  Patient Measurements: Height:  (160 cm) Weight: 193 lb 9 oz (87.8 kg) IBW/kg (Calculated) : 52.4 Heparin Dosing Weight: 70 kg  Vital Signs: Temp: 98.1 F (36.7 C) (10/01 1900) BP: 108/75 mmHg (10/01 1900) Pulse Rate: 95 (10/01 1800)  Labs:  Recent Labs  02/07/15 0405  02/07/15 1423  02/08/15 0438  02/08/15 1615 02/08/15 1617 02/09/15 0420 02/09/15 1625 02/09/15 1909  HGB 9.3*  < > 12.7  < > 12.2  --  9.4* 10.9* 8.9* 10.2*  --   HCT 28.2*  < > 36.5  < > 35.5*  --  28.0* 32.0* 26.0* 30.0*  --   PLT 141*  < > 113*  --  144*  --  99*  --  93*  --   --   APTT  --   --  38*  --   --   --   --   --   --   --   --   LABPROT  --   --  19.1*  --   --   --   --   --   --   --   --   INR  --   --  1.60*  --   --   --   --   --   --   --   --   HEPARINUNFRC 0.56  --   --   --   --   --   --   --   --   --  0.38  CREATININE 0.66  < >  --   < >  --   < > 0.97 0.80 1.26* 1.00  --   CKTOTAL  --   --   --   --   --   --   --   --  626*  --   --   CKMB  --   --   --   --   --   --   --   --  27.0*  --   --   TROPONINI  --   --   --   --  15.26*  --   --   --  4.87*  --   --   < > = values in this interval not displayed.  Estimated Creatinine Clearance: 76.3 mL/min (by C-G formula based on Cr of 1).  Assessment: 44yo female s/p IABP due to cardiogenic shock and cardiac arrest.  She had CABG on 9/29 as well.  She has some post-op anemia and we have been asked to start her on low-dose Heparin therapy with a goal HL of 0.25-0.35 per MD.   Hgb 8.9, plts 93. No bleeding noted.   Goal of Therapy:  HL 0.25-0.35 per MD Monitor platelets by anticoagulation protocol: Yes    Plan:  -  continue heparin at 500 units/hr -  Check HL with AM labs- if higher, will need to decrease rate -  Daily CBC/HL while on heparin   Khady Vandenberg D. Juda Toepfer, PharmD, BCPS Clinical Pharmacist Pager:  [phone] 02/09/2015 9:03 PM

## 2015-02-09 NOTE — Progress Notes (Signed)
TCTS BRIEF SICU PROGRESS NOTE  R2 Days Post-Op S/P Procedure(s) (LRB): CORONARY ARTERY BYPASS GRAFTING (CABG) (N/A)  2 Days Post-Op  S/P Procedure(s) (LRB): IABP Insertion (N/A)   Stable day NSR w/ stable hemodynamics, now off Levophed w/ Epi @ 5 milrinone @ 0.375 UOP adequate Labs okay creatinine 1.00  Plan: Continue current plan  Purcell Nails, MD 02/09/2015 6:31 PM

## 2015-02-09 NOTE — Progress Notes (Signed)
ANTICOAGULATION CONSULT NOTE - Initial Consult  Pharmacy Consult for Heparin and Vancomycin Indication:  IABP, rise in WBC  No Known Allergies  Patient Measurements: Height:  (160 cm) Weight: 193 lb 9 oz (87.8 kg) IBW/kg (Calculated) : 52.4 Heparin Dosing Weight: 70 kg  Vital Signs: Temp: 97.3 F (36.3 C) (10/01 1000) BP: 106/48 mmHg (10/01 1000) Pulse Rate: 90 (10/01 1000)  Labs:  Recent Labs  02/07/15 0405  02/07/15 1423  02/08/15 0438  02/08/15 1615 02/08/15 1617 02/09/15 0420  HGB 9.3*  < > 12.7  < > 12.2  --  9.4* 10.9* 8.9*  HCT 28.2*  < > 36.5  < > 35.5*  --  28.0* 32.0* 26.0*  PLT 141*  < > 113*  --  144*  --  99*  --  93*  APTT  --   --  38*  --   --   --   --   --   --   LABPROT  --   --  19.1*  --   --   --   --   --   --   INR  --   --  1.60*  --   --   --   --   --   --   HEPARINUNFRC 0.56  --   --   --   --   --   --   --   --   CREATININE 0.66  < >  --   < >  --   < > 0.97 0.80 1.26*  CKTOTAL  --   --   --   --   --   --   --   --  626*  CKMB  --   --   --   --   --   --   --   --  27.0*  TROPONINI  --   --   --   --  15.26*  --   --   --  4.87*  < > = values in this interval not displayed.  Estimated Creatinine Clearance: 60.5 mL/min (by C-G formula based on Cr of 1.26).  Medical History: Past Medical History  Diagnosis Date  . Hypertension   . Diabetes mellitus without complication   . MI (myocardial infarction)   . CAD (coronary artery disease)     cath 09/07/2014 DES to 95% prox RCA, residual 70-80% origin of AV groove, 90% ostial PDA stenosis  . Hyperlipidemia   . Tobacco abuse   . H/O medication noncompliance   . Carotid artery disease     a. 40-59% BICA by duplex 01/2015.   Assessment: 44yo female s/p IABP due to cardiogenic shock and cardiac arrest.  She had CABG on 9/29 as well.  She has some post-op anemia and we have been asked to start her on low-dose Heparin therapy with a goal HL of 0.25-0.35 per MD.  She received an AM dose  of LMWH for DVT prophylaxis and I have discontinued this.  H/H currently is 8.9/26.0 and a platelet count of 93K.  Creatinine has bumped from 0.8 - 1.26, she has had a significant drop in her UOP (1.8>>0.55ml/kg/hr).  This may all be due to her cardiac arrest and recent surgery.  Goal of Therapy:  HL 0.25-0.35 per MD Monitor platelets by anticoagulation protocol: Yes  Vanc Trough: 15-20   Plan:  -  Begin IV heparin without a bolus at 500 units/hr -  Check  HL in 8 hours and adjust to goal as indicated above -  Daily CBC/HL while on heparin -  Will give one dose of Vancomycin  IV x 1 and f/u renal function in AM (will redose at that time) -  Check BMET in AM  Nadara Mustard, PharmD., MS Clinical Pharmacist Pager:  [phone] Thank you for allowing pharmacy to be part of this patients care team. 02/09/2015,10:10 AM

## 2015-02-09 NOTE — Progress Notes (Addendum)
[address]       [place] [address]             [phone]        CARDIOTHORACIC SURGERY PROGRESS NOTE  R2 Days Post-Op S/P Procedure(s) (LRB): CORONARY ARTERY BYPASS GRAFTING (CABG) (N/A)   R2 Days Post-Op Procedure(s) (LRB): IABP Insertion (N/A)  Subjective: Awake and alert.  Denies pain.  Feels short of breath  Objective: Vital signs: BP Readings from Last 1 Encounters:  02/09/15 106/48   Pulse Readings from Last 1 Encounters:  02/09/15 90   Resp Readings from Last 1 Encounters:  02/09/15 16   Temp Readings from Last 1 Encounters:  02/09/15 97.3 F (36.3 C)     Hemodynamics: PAP: (26-36)/(14-24) 27/22 mmHg CO:  [3.1 L/min-4.5 L/min] 3.9 L/min CI:  [1.7 L/min/m2-2.5 L/min/m2] 2.1 L/min/m2  Mixed venous co-ox 54.3%  Physical Exam:  Rhythm:   sinus  Breath sounds: Diminished at bases  Heart sounds:  RRR  Incisions:  Dressing dry, intact  Abdomen:  Soft, non-distended, non-tender  Extremities:  Warm, well-perfused  Chest tubes:  Low volume thin serosanguinous output, no air leak    Intake/Output from previous day: 09/30 0701 - 10/01 0700 In: 3884.5 [P.O.:920; I.V.:2314.5; NG/GT:70; IV Piggyback:580] Out: 1300 [Urine:560; Emesis/NG output:300; Chest Tube:440] Intake/Output this shift: Total I/O In: 247.1 [I.V.:247.1] Out: 130 [Urine:110; Chest Tube:20]  Lab Results:  CBC: Recent Labs  02/08/15 1615 02/08/15 1617 02/09/15 0420  WBC 20.4*  --  23.5*  HGB 9.4* 10.9* 8.9*  HCT 28.0* 32.0* 26.0*  PLT 99*  --  93*    BMET:  Recent Labs  02/08/15 0500  02/08/15 1617 02/09/15 0420  NA 139  --  141 134*  K 3.2*  --  3.8 4.6  CL 109  --  105 104  CO2 21*  --   --  21*  GLUCOSE 214*  --  147* 226*  BUN 9  --  13 17  CREATININE 1.01*  < > 0.80 1.26*  CALCIUM 7.6*  --   --  7.4*  < > = values in this interval not displayed.   PT/INR:   Recent Labs  02/07/15 1423  LABPROT 19.1*  INR 1.60*    CBG (last 3)    Recent Labs  02/09/15 0512 02/09/15 0604 02/09/15 0659  GLUCAP 203* 205* 179*    ABG    Component Value Date/Time   PHART 7.350 02/08/2015 1614   PCO2ART 34.5* 02/08/2015 1614   PO2ART 84.0 02/08/2015 1614   HCO3 19.1* 02/08/2015 1614   TCO2 18 02/08/2015 1617   ACIDBASEDEF 6.0* 02/08/2015 1614   O2SAT 54.3 02/09/2015 0425    CXR: PORTABLE CHEST 1 VIEW  COMPARISON: February 08, 2015  FINDINGS: Endotracheal tube and nasogastric tube have been removed. Swan-Ganz catheter tip is in the main pulmonary outflow tract directed toward the right, stable. There is a chest tube on the left. Temporary pacemaker wires are attached to the right heart. Intra-aortic balloon pump tip is slightly inferior to the aortic arch region, stable. No pneumothorax. There is no edema or consolidation. Heart is borderline prominent with pulmonary vascularity within normal limits. No adenopathy.  IMPRESSION: Tube and catheter positions as described without pneumothorax. No edema or consolidation. Heart slightly prominent but stable.   Electronically Signed  By: Bretta Bang III M.D.  On: 02/09/2015 07:28  Assessment/Plan:  Currently stable, maintaining NSR w/ stable hemodynamics and low PA pressures although mixed-venous  co-ox only 54% and still on Epi, Levophed and milrinone drips Oxygenation stable and CXR fairly clear w/ O2 sats 99-100% on 2 L/min Expected post op acute blood loss anemia, stable Post op thrombocytopenia, stable Type II diabetes mellitus, adequate glycemic control on insulin drip   Keep IABP in place as previously planned  Start heparin drip w/out bolus and watch platelet count closely  Gentle diuresis w/ lasix drip  Continue insulin drip  Wean drips slowly as tolerated   Purcell Nails, MD 02/09/2015 10:16 AM

## 2015-02-10 ENCOUNTER — Inpatient Hospital Stay (HOSPITAL_COMMUNITY): Payer: Medicaid Other

## 2015-02-10 LAB — TYPE AND SCREEN
ABO/RH(D): O POS
Antibody Screen: NEGATIVE
Unit division: 0
Unit division: 0
Unit division: 0
Unit division: 0
Unit division: 0
Unit division: 0

## 2015-02-10 LAB — COMPREHENSIVE METABOLIC PANEL
ALT: 185 U/L — ABNORMAL HIGH (ref 14–54)
AST: 96 U/L — ABNORMAL HIGH (ref 15–41)
Albumin: 3.1 g/dL — ABNORMAL LOW (ref 3.5–5.0)
Alkaline Phosphatase: 67 U/L (ref 38–126)
Anion gap: 7 (ref 5–15)
BUN: 22 mg/dL — ABNORMAL HIGH (ref 6–20)
CO2: 23 mmol/L (ref 22–32)
Calcium: 6.9 mg/dL — ABNORMAL LOW (ref 8.9–10.3)
Chloride: 100 mmol/L — ABNORMAL LOW (ref 101–111)
Creatinine, Ser: 1.26 mg/dL — ABNORMAL HIGH (ref 0.44–1.00)
GFR calc Af Amer: 60 mL/min — ABNORMAL LOW (ref >60)
GFR calc non Af Amer: 51 mL/min — ABNORMAL LOW (ref >60)
Glucose, Bld: 122 mg/dL — ABNORMAL HIGH (ref 65–99)
Potassium: 4.1 mmol/L (ref 3.5–5.1)
Sodium: 130 mmol/L — ABNORMAL LOW (ref 135–145)
Total Bilirubin: 0.8 mg/dL (ref 0.3–1.2)
Total Protein: 5.1 g/dL — ABNORMAL LOW (ref 6.5–8.1)

## 2015-02-10 LAB — APTT
aPTT: 74 seconds — ABNORMAL HIGH (ref 24–37)
aPTT: 77 seconds — ABNORMAL HIGH (ref 24–37)

## 2015-02-10 LAB — CBC
HCT: 26.7 % — ABNORMAL LOW (ref 36.0–46.0)
Hemoglobin: 9.1 g/dL — ABNORMAL LOW (ref 12.0–15.0)
MCH: 29.8 pg (ref 26.0–34.0)
MCHC: 34.1 g/dL (ref 30.0–36.0)
MCV: 87.5 fL (ref 78.0–100.0)
Platelets: 54 10*3/uL — ABNORMAL LOW (ref 150–400)
RBC: 3.05 MIL/uL — ABNORMAL LOW (ref 3.87–5.11)
RDW: 15.6 % — ABNORMAL HIGH (ref 11.5–15.5)
WBC: 17 10*3/uL — ABNORMAL HIGH (ref 4.0–10.5)

## 2015-02-10 LAB — CULTURE, RESPIRATORY W GRAM STAIN
Culture: NO GROWTH
Gram Stain: NONE SEEN
Special Requests: NORMAL

## 2015-02-10 LAB — GLUCOSE, CAPILLARY
GLUCOSE-CAPILLARY: 119 mg/dL — AB (ref 65–99)
GLUCOSE-CAPILLARY: 129 mg/dL — AB (ref 65–99)
GLUCOSE-CAPILLARY: 161 mg/dL — AB (ref 65–99)
GLUCOSE-CAPILLARY: 114 mg/dL — AB (ref 65–99)
GLUCOSE-CAPILLARY: 81 mg/dL (ref 65–99)
GLUCOSE-CAPILLARY: 72 mg/dL (ref 65–99)
GLUCOSE-CAPILLARY: 151 mg/dL — AB (ref 65–99)
GLUCOSE-CAPILLARY: 151 mg/dL — AB (ref 65–99)
Glucose-Capillary: 100 mg/dL — ABNORMAL HIGH (ref 65–99)
Glucose-Capillary: 103 mg/dL — ABNORMAL HIGH (ref 65–99)
Glucose-Capillary: 110 mg/dL — ABNORMAL HIGH (ref 65–99)
Glucose-Capillary: 111 mg/dL — ABNORMAL HIGH (ref 65–99)
Glucose-Capillary: 123 mg/dL — ABNORMAL HIGH (ref 65–99)
Glucose-Capillary: 130 mg/dL — ABNORMAL HIGH (ref 65–99)
Glucose-Capillary: 132 mg/dL — ABNORMAL HIGH (ref 65–99)
Glucose-Capillary: 143 mg/dL — ABNORMAL HIGH (ref 65–99)
Glucose-Capillary: 145 mg/dL — ABNORMAL HIGH (ref 65–99)
Glucose-Capillary: 146 mg/dL — ABNORMAL HIGH (ref 65–99)
Glucose-Capillary: 148 mg/dL — ABNORMAL HIGH (ref 65–99)
Glucose-Capillary: 151 mg/dL — ABNORMAL HIGH (ref 65–99)
Glucose-Capillary: 153 mg/dL — ABNORMAL HIGH (ref 65–99)
Glucose-Capillary: 155 mg/dL — ABNORMAL HIGH (ref 65–99)
Glucose-Capillary: 74 mg/dL (ref 65–99)
Glucose-Capillary: 97 mg/dL (ref 65–99)

## 2015-02-10 LAB — POCT I-STAT, CHEM 8
BUN: 22 mg/dL — AB (ref 6–20)
CALCIUM ION: 1.09 mmol/L — AB (ref 1.12–1.23)
CREATININE: 1.2 mg/dL — AB (ref 0.44–1.00)
Chloride: 97 mmol/L — ABNORMAL LOW (ref 101–111)
Glucose, Bld: 113 mg/dL — ABNORMAL HIGH (ref 65–99)
HCT: 33 % — ABNORMAL LOW (ref 36.0–46.0)
HEMOGLOBIN: 11.2 g/dL — AB (ref 12.0–15.0)
Potassium: 3.3 mmol/L — ABNORMAL LOW (ref 3.5–5.1)
Sodium: 132 mmol/L — ABNORMAL LOW (ref 135–145)
TCO2: 25 mmol/L (ref 0–100)

## 2015-02-10 LAB — CARBOXYHEMOGLOBIN
CARBOXYHEMOGLOBIN: 1.3 % (ref 0.5–1.5)
CARBOXYHEMOGLOBIN: 1.5 % (ref 0.5–1.5)
Carboxyhemoglobin: 1 % (ref 0.5–1.5)
METHEMOGLOBIN: 0.8 % (ref 0.0–1.5)
METHEMOGLOBIN: 0.8 % (ref 0.0–1.5)
Methemoglobin: 0.9 % (ref 0.0–1.5)
O2 SAT: 56.6 %
O2 Saturation: 37.3 %
O2 Saturation: 51.1 %
TOTAL HEMOGLOBIN: 12.3 g/dL (ref 12.0–16.0)
Total hemoglobin: 9.2 g/dL — ABNORMAL LOW (ref 12.0–16.0)
Total hemoglobin: 9.8 g/dL — ABNORMAL LOW (ref 12.0–16.0)

## 2015-02-10 LAB — BLOOD GAS, ARTERIAL
ACID-BASE DEFICIT: 3.9 mmol/L — AB (ref 0.0–2.0)
BICARBONATE: 20.7 meq/L (ref 20.0–24.0)
Drawn by: 437071
FIO2: 0.21
O2 Saturation: 90.8 %
PATIENT TEMPERATURE: 98.6
PCO2 ART: 37.7 mmHg (ref 35.0–45.0)
PH ART: 7.358 (ref 7.350–7.450)
PO2 ART: 63.2 mmHg — AB (ref 80.0–100.0)
TCO2: 21.8 mmol/L (ref 0–100)

## 2015-02-10 LAB — HEPARIN LEVEL (UNFRACTIONATED): Heparin Unfractionated: 0.38 IU/mL (ref 0.30–0.70)

## 2015-02-10 MED ORDER — ARGATROBAN 50 MG/50ML IV SOLN
0.5000 ug/kg/min | INTRAVENOUS | Status: DC
  Filled 2015-02-10: qty 50

## 2015-02-10 MED ORDER — LEVALBUTEROL HCL 1.25 MG/0.5ML IN NEBU
1.2500 mg | INHALATION_SOLUTION | Freq: Three times a day (TID) | RESPIRATORY_TRACT | Status: DC
  Filled 2015-02-10 (×4): qty 0.5

## 2015-02-10 MED ORDER — METOLAZONE 5 MG PO TABS
5.0000 mg | ORAL_TABLET | Freq: Once | ORAL | Status: AC
  Administered 2015-02-10: 5 mg via ORAL
  Filled 2015-02-10: qty 1

## 2015-02-10 MED ORDER — POTASSIUM CHLORIDE 10 MEQ/50ML IV SOLN
10.0000 meq | INTRAVENOUS | Status: AC
  Administered 2015-02-10 (×2): 10 meq via INTRAVENOUS
  Filled 2015-02-10 (×2): qty 50

## 2015-02-10 MED ORDER — POTASSIUM CHLORIDE 10 MEQ/50ML IV SOLN
10.0000 meq | INTRAVENOUS | Status: AC | PRN
  Administered 2015-02-10 (×3): 10 meq via INTRAVENOUS
  Filled 2015-02-10 (×3): qty 50

## 2015-02-10 MED ORDER — VANCOMYCIN HCL IN DEXTROSE 1-5 GM/200ML-% IV SOLN
1000.0000 mg | Freq: Two times a day (BID) | INTRAVENOUS | Status: DC
  Administered 2015-02-10 – 2015-02-12 (×6): 1000 mg via INTRAVENOUS
  Filled 2015-02-10 (×8): qty 200

## 2015-02-10 MED ORDER — BIVALIRUDIN 250 MG IV SOLR
0.1000 mg/kg/h | INTRAVENOUS | Status: DC
  Administered 2015-02-10 – 2015-02-11 (×2): 0.15 mg/kg/h via INTRAVENOUS
  Filled 2015-02-10 (×2): qty 250

## 2015-02-10 MED ORDER — POTASSIUM CHLORIDE 10 MEQ/50ML IV SOLN: 10.0000 meq | INTRAVENOUS | Status: DC

## 2015-02-10 NOTE — Progress Notes (Signed)
ANTICOAGULATION CONSULT NOTE  Pharmacy Consult for bivalirudin Indication:  IABP  No Known Allergies  Patient Measurements: Height:  (160 cm) Weight: 195 lb 15.8 oz (88.9 kg) IBW/kg (Calculated) : 52.4   Vital Signs: Temp: 93.6 F (34.2 C) (10/02 1400) BP: 118/76 mmHg (10/02 1400) Pulse Rate: 108 (10/02 1400)  Labs:  Recent Labs  02/08/15 0438  02/08/15 1615  02/09/15 0420 02/09/15 1625 02/09/15 1909 02/10/15 0400 02/10/15 1406  HGB 12.2  --  9.4*  < > 8.9* 10.2*  --  9.1*  --   HCT 35.5*  --  28.0*  < > 26.0* 30.0*  --  26.7*  --   PLT 144*  --  99*  --  93*  --   --  54*  --   APTT  --   --   --   --   --   --   --   --  74*  HEPARINUNFRC  --   --   --   --   --   --  0.38 0.38  --   CREATININE  --   < > 0.97  < > 1.26* 1.00  --  1.26*  --   CKTOTAL  --   --   --   --  626*  --   --   --   --   CKMB  --   --   --   --  27.0*  --   --   --   --   TROPONINI 15.26*  --   --   --  4.87*  --   --   --   --   < > = values in this interval not displayed.  Estimated Creatinine Clearance: 60.9 mL/min (by C-G formula based on Cr of 1.26).  Assessment: 44yo female s/p IABP due to cardiogenic shock and cardiac arrest.  She had CABG on 9/29 as well.  She was started on low-dose heparin (goal HL of 0.25-0.35 per MD).  Hgb 9.1, plts dropped further to 54. No bleeding noted.  She was switched to bivalirudin this morning. First aPTT in range at 74 seconds. Currently on 0.15mg /kg/hr.  Goal of Therapy: APTT 50-85 seconds  Plan: -continue bivalidirudin at 0.15mg /kg/hr = 26.41mL/hr -next aPTT in 4h at 1800 -will check aPTT q4h until therapeutic x2, then daily levels -daily CBC -follow for s/s bleeding -follow HIT results  Hope Brandenburger D. Dewain Platz, PharmD, BCPS Clinical Pharmacist Pager: [phone] 02/10/2015 2:46 PM

## 2015-02-10 NOTE — Progress Notes (Addendum)
[address]       [place] [address]             [phone]        CARDIOTHORACIC SURGERY PROGRESS NOTE  R3 Days Post-Op S/P Procedure(s) (LRB): CORONARY ARTERY BYPASS GRAFTING (CABG) (N/A)   R3 Days Post-Op Procedure(s) (LRB): IABP Insertion (N/A)  Subjective: Looks comfortable.  Denies pain, SOB  Objective: Vital signs: BP Readings from Last 1 Encounters:  02/10/15 111/67   Pulse Readings from Last 1 Encounters:  02/10/15 96   Resp Readings from Last 1 Encounters:  02/10/15 10   Temp Readings from Last 1 Encounters:  02/10/15 97.7 F (36.5 C)     Hemodynamics: PAP: (27-38)/(19-27) 35/24 mmHg CO:  [2.7 L/min-4.1 L/min] 4.1 L/min CI:  [1.5 L/min/m2-2.2 L/min/m2] 2.2 L/min/m2  Mixed venous co-ox 37.3%  Physical Exam:  Rhythm:   sinus  Breath sounds: Coarse but clear  Heart sounds:  RRR  Incisions:  Dressing dry, intact  Abdomen:  Soft, non-distended, non-tender  Extremities:  Warm, well-perfused  Chest tubes:  Low volume thin serosanguinous output, no air leak    Intake/Output from previous day: 10/01 0701 - 10/02 0700 In: 2147 [I.V.:2147] Out: 1775 [Urine:1235; Chest Tube:540] Intake/Output this shift: Total I/O In: 223.1 [I.V.:223.1] Out: 190 [Urine:190]  Lab Results:  CBC: Recent Labs  02/09/15 0420 02/09/15 1625 02/10/15 0400  WBC 23.5*  --  17.0*  HGB 8.9* 10.2* 9.1*  HCT 26.0* 30.0* 26.7*  PLT 93*  --  54*    BMET:  Recent Labs  02/09/15 0420 02/09/15 1625 02/10/15 0400  NA 134* 135 130*  K 4.6 4.2 4.1  CL 104 99* 100*  CO2 21*  --  23  GLUCOSE 226* 123* 122*  BUN 17 21* 22*  CREATININE 1.26* 1.00 1.26*  CALCIUM 7.4*  --  6.9*     PT/INR:   Recent Labs  02/07/15 1423  LABPROT 19.1*  INR 1.60*    CBG (last 3)   Recent Labs  02/09/15 2218 02/09/15 2259 02/10/15 0004  GLUCAP 155* 151* 145*    ABG    Component Value Date/Time   PHART 7.358 02/10/2015 0500   PCO2ART 37.7  02/10/2015 0500   PO2ART 63.2* 02/10/2015 0500   HCO3 20.7 02/10/2015 0500   TCO2 21.8 02/10/2015 0500   ACIDBASEDEF 3.9* 02/10/2015 0500   O2SAT 90.8 02/10/2015 0500    CXR: PORTABLE CHEST 1 VIEW  COMPARISON: February 09, 2015  FINDINGS: Swan-Ganz catheter tip is in the main pulmonary outflow tract directed toward the right. There is a mediastinal drain and a left chest tube. Intra-aortic balloon pump no longer appreciable. No pneumothorax. There is mild atelectasis in the right base. There are small pleural effusions bilaterally. Heart is slightly enlarged with pulmonary vascularity within normal limits. No adenopathy.  IMPRESSION: Tube and catheter positions as described without pneumothorax. Small layering effusions with right base atelectasis. No airspace consolidation. No change in cardiac silhouette.   Electronically Signed  By: Bretta Bang III M.D.  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

## 2015-02-10 NOTE — Progress Notes (Addendum)
ANTICOAGULATION CONSULT NOTE  Pharmacy Consult for Heparin and Vancomycin Indication:  IABP, rise in WBC  No Known Allergies  Patient Measurements: Height:  (160 cm) Weight: 195 lb 15.8 oz (88.9 kg) IBW/kg (Calculated) : 52.4 Heparin Dosing Weight: 70 kg  Vital Signs: Temp: 97.7 F (36.5 C) (10/02 1000) BP: 111/67 mmHg (10/02 1000) Pulse Rate: 96 (10/02 0800)  Labs:  Recent Labs  02/07/15 1423  02/08/15 0438  02/08/15 1615  02/09/15 0420 02/09/15 1625 02/09/15 1909 02/10/15 0400  HGB 12.7  < > 12.2  --  9.4*  < > 8.9* 10.2*  --  9.1*  HCT 36.5  < > 35.5*  --  28.0*  < > 26.0* 30.0*  --  26.7*  PLT 113*  --  144*  --  99*  --  93*  --   --  54*  APTT 38*  --   --   --   --   --   --   --   --   --   LABPROT 19.1*  --   --   --   --   --   --   --   --   --   INR 1.60*  --   --   --   --   --   --   --   --   --   HEPARINUNFRC  --   --   --   --   --   --   --   --  0.38 0.38  CREATININE  --   < >  --   < > 0.97  < > 1.26* 1.00  --  1.26*  CKTOTAL  --   --   --   --   --   --  626*  --   --   --   CKMB  --   --   --   --   --   --  27.0*  --   --   --   TROPONINI  --   --  15.26*  --   --   --  4.87*  --   --   --   < > = values in this interval not displayed.  Estimated Creatinine Clearance: 60.9 mL/min (by C-G formula based on Cr of 1.26).  Assessment: 44yo female s/p IABP due to cardiogenic shock and cardiac arrest.  She had CABG on 9/29 as well.  She was started on low-dose heparin (goal HL of 0.25-0.35 per MD).  Heparin level x2 has been above goal at 0.38 units/mL. No accumulation occurred overnight. Hgb 9.1, plts dropped further to 54. No bleeding noted.  SCr bumped from 0.8 - 1.26, and UOP fell off yesterday, so she was only given a 1 time dose of vancomycin as there was concern she was headed towards AKI. SCr this morning is stable at 1.26 and UOP in the past 24h is 0.12mL/kg/hr. WBC dropped to 17, remains afebrile.  Goal of Therapy:  HL 0.25-0.3  units/mL per MD consult Monitor platelets by anticoagulation protocol: Yes  Vancomycin Trough: 15-20 mcg/mL   Plan:  -  Reduce heparin slightly to 450 units/hr -  Daily CBC/HL while on heparin -  Vancomycin 1g IV q12h starting now -  Follow SCr, UOP, trough at [facility] -  Follow clinical progression, if any cultures are drawn, LOT of vanc  Lauren D. Bajbus, PharmD, BCPS Clinical Pharmacist Pager: [phone] 02/10/2015 10:35 AM  ADDENDUM To stop heparin and transition to bivalirudin d/t possible HIT with platelet drop. HIT antibody ordered by MD. CrCl ~34mL/min  Goal of Therapy: APTT 50-85 seconds  Plan: -stop heparin -start bivalidirudin at 0.15mg /kg/hr = 26.11mL/hr -check aPTT in 2 hours after infusion starts -will check aPTT q4h until therapeutic x2, then daily levels -daily CBC  Lauren D. Bajbus, PharmD, BCPS Clinical Pharmacist Pager: [phone] 02/10/2015 10:49 AM   ADDENDUM:  APTT therapeutic (77) x 2 on bival 0.15mg /kg/h (26.7 ml/h). To follow daily aPTTs. Small amount of blood oozing from IABP site per RN note. Spoke with RN - bleed has not increased and no IV line issues. Dr. Cornelius Moras updated on status.  Plan Continue bival . /kg/h (26.32ml/h) Daily aPTTs Daily CBC Mon s/sx increased bleeding  Babs Bertin, PharmD Clinical Pharmacist Pager [phone] 02/10/2015 8:28 PM

## 2015-02-10 NOTE — Progress Notes (Signed)
Dr. Cornelius Moras updated on patient status. Also small amount of blood oozing at IABP site.

## 2015-02-10 NOTE — Progress Notes (Signed)
Subjective:  POD #3 CABG X 3 complicated by PEA arrest/VT/ Shock/ CPR/IABP insertion  Feels ok. This am cadiac output, BP and oxygen was low. Co-ox 37%. Epi increased from 5->6. Levophed restarted at 1. Now CI 2.2. Urine output now back up.  Epi @ 6 Levophed @ 1 Milrinone @ 0.375 with IABP 1:1  On lasix gtt but weight still going up. Denies SOB.  PA 33/23 Thermo 4.2/2.2  Objective:  Temp:  [90.7 F (32.6 C)-98.2 F (36.8 C)] 97.7 F (36.5 C) (10/02 1000) Pulse Rate:  [85-101] 96 (10/02 0800) Resp:  [8-27] 10 (10/02 1000) BP: (84-134)/(33-92) 111/67 mmHg (10/02 1000) SpO2:  [96 %-100 %] 100 % (10/02 0800) Arterial Line BP: (101-107)/(60-62) 101/60 mmHg (10/01 1200) Weight:  [88.9 kg (195 lb 15.8 oz)] 88.9 kg (195 lb 15.8 oz) (10/02 0500) Weight change: 1.1 kg (2 lb 6.8 oz)  Intake/Output from previous day: 10/01 0701 - 10/02 0700 In: 2147 [I.V.:2147] Out: 9675 [Urine:1235; Chest Tube:540]  Intake/Output from this shift: Total I/O In: 223.1 [I.V.:223.1] Out: 190 [Urine:190]  Physical Exam: General appearance: awake. conversant Neck: supple RIJ swan Lungs: clear Heart: regular rate and rhythm, S1, S2 normal, no murmur, click, rub or gallop Extremities: extremities normal, atraumatic, no cyanosisand IABP on Left, 1+ LPP. Mild edema.   Lab Results: Results for orders placed or performed during the hospital encounter of 02/01/15 (from the past 48 hour(s))  Glucose, capillary     Status: Abnormal   Collection Time: 02/08/15 11:08 AM  Result Value Ref Range   Glucose-Capillary 177 (H) 65 - 99 mg/dL  Glucose, capillary     Status: Abnormal   Collection Time: 02/08/15 12:05 PM  Result Value Ref Range   Glucose-Capillary 154 (H) 65 - 99 mg/dL  I-STAT 3, arterial blood gas (G3+)     Status: Abnormal   Collection Time: 02/08/15 12:42 PM  Result Value Ref Range   pH, Arterial 7.356 7.350 - 7.450   pCO2 arterial 37.5 35.0 - 45.0 mmHg   pO2, Arterial 82.0 80.0 -  100.0 mmHg   Bicarbonate 21.2 20.0 - 24.0 mEq/L   TCO2 22 0 - 100 mmol/L   O2 Saturation 96.0 %   Acid-base deficit 4.0 (H) 0.0 - 2.0 mmol/L   Patient temperature 36.1 C    Sample type ARTERIAL   Glucose, capillary     Status: Abnormal   Collection Time: 02/08/15 12:43 PM  Result Value Ref Range   Glucose-Capillary 166 (H) 65 - 99 mg/dL  Glucose, capillary     Status: Abnormal   Collection Time: 02/08/15  2:26 PM  Result Value Ref Range   Glucose-Capillary 152 (H) 65 - 99 mg/dL  I-STAT 3, arterial blood gas (G3+)     Status: Abnormal   Collection Time: 02/08/15  2:27 PM  Result Value Ref Range   pH, Arterial 7.389 7.350 - 7.450   pCO2 arterial 35.2 35.0 - 45.0 mmHg   pO2, Arterial 81.0 80.0 - 100.0 mmHg   Bicarbonate 21.3 20.0 - 24.0 mEq/L   TCO2 22 0 - 100 mmol/L   O2 Saturation 96.0 %   Acid-base deficit 3.0 (H) 0.0 - 2.0 mmol/L   Patient temperature 36.7 C    Sample type ARTERIAL   Glucose, capillary     Status: Abnormal   Collection Time: 02/08/15  3:39 PM  Result Value Ref Range   Glucose-Capillary 153 (H) 65 - 99 mg/dL  Glucose, capillary     Status:  Abnormal   Collection Time: 02/08/15  4:13 PM  Result Value Ref Range   Glucose-Capillary 142 (H) 65 - 99 mg/dL  I-STAT 3, arterial blood gas (G3+)     Status: Abnormal   Collection Time: 02/08/15  4:14 PM  Result Value Ref Range   pH, Arterial 7.350 7.350 - 7.450   pCO2 arterial 34.5 (L) 35.0 - 45.0 mmHg   pO2, Arterial 84.0 80.0 - 100.0 mmHg   Bicarbonate 19.1 (L) 20.0 - 24.0 mEq/L   TCO2 20 0 - 100 mmol/L   O2 Saturation 96.0 %   Acid-base deficit 6.0 (H) 0.0 - 2.0 mmol/L   Patient temperature 36.8 C    Sample type ARTERIAL   Magnesium     Status: None   Collection Time: 02/08/15  4:15 PM  Result Value Ref Range   Magnesium 1.9 1.7 - 2.4 mg/dL  CBC     Status: Abnormal   Collection Time: 02/08/15  4:15 PM  Result Value Ref Range   WBC 20.4 (H) 4.0 - 10.5 K/uL   RBC 3.27 (L) 3.87 - 5.11 MIL/uL   Hemoglobin  9.4 (L) 12.0 - 15.0 g/dL    Comment: REPEATED TO VERIFY   HCT 28.0 (L) 36.0 - 46.0 %   MCV 85.6 78.0 - 100.0 fL   MCH 28.7 26.0 - 34.0 pg   MCHC 33.6 30.0 - 36.0 g/dL   RDW 14.8 11.5 - 15.5 %   Platelets 99 (L) 150 - 400 K/uL  Creatinine, serum     Status: None   Collection Time: 02/08/15  4:15 PM  Result Value Ref Range   Creatinine, Ser 0.97 0.44 - 1.00 mg/dL   GFR calc non Af Amer >60 >60 mL/min   GFR calc Af Amer >60 >60 mL/min    Comment: (NOTE) The eGFR has been calculated using the CKD EPI equation. This calculation has not been validated in all clinical situations. eGFR's persistently <60 mL/min signify possible Chronic Kidney Disease.   I-STAT, chem 8     Status: Abnormal   Collection Time: 02/08/15  4:17 PM  Result Value Ref Range   Sodium 141 135 - 145 mmol/L   Potassium 3.8 3.5 - 5.1 mmol/L   Chloride 105 101 - 111 mmol/L   BUN 13 6 - 20 mg/dL   Creatinine, Ser 0.80 0.44 - 1.00 mg/dL   Glucose, Bld 147 (H) 65 - 99 mg/dL   Calcium, Ion 1.07 (L) 1.12 - 1.23 mmol/L   TCO2 18 0 - 100 mmol/L   Hemoglobin 10.9 (L) 12.0 - 15.0 g/dL   HCT 32.0 (L) 36.0 - 46.0 %  Glucose, capillary     Status: Abnormal   Collection Time: 02/08/15  5:06 PM  Result Value Ref Range   Glucose-Capillary 142 (H) 65 - 99 mg/dL  Glucose, capillary     Status: Abnormal   Collection Time: 02/08/15  6:18 PM  Result Value Ref Range   Glucose-Capillary 136 (H) 65 - 99 mg/dL  Glucose, capillary     Status: Abnormal   Collection Time: 02/08/15  6:52 PM  Result Value Ref Range   Glucose-Capillary 129 (H) 65 - 99 mg/dL  Glucose, capillary     Status: Abnormal   Collection Time: 02/08/15  7:57 PM  Result Value Ref Range   Glucose-Capillary 130 (H) 65 - 99 mg/dL   Comment 1 Arterial Specimen   Glucose, capillary     Status: Abnormal   Collection Time: 02/08/15  9:04 PM  Result Value Ref Range   Glucose-Capillary 135 (H) 65 - 99 mg/dL   Comment 1 Capillary Specimen   Glucose, capillary      Status: Abnormal   Collection Time: 02/08/15  9:59 PM  Result Value Ref Range   Glucose-Capillary 132 (H) 65 - 99 mg/dL   Comment 1 Capillary Specimen   Glucose, capillary     Status: Abnormal   Collection Time: 02/08/15 11:00 PM  Result Value Ref Range   Glucose-Capillary 128 (H) 65 - 99 mg/dL   Comment 1 Capillary Specimen   Glucose, capillary     Status: Abnormal   Collection Time: 02/09/15 12:08 AM  Result Value Ref Range   Glucose-Capillary 176 (H) 65 - 99 mg/dL   Comment 1 Arterial Specimen   Glucose, capillary     Status: Abnormal   Collection Time: 02/09/15  1:12 AM  Result Value Ref Range   Glucose-Capillary 179 (H) 65 - 99 mg/dL   Comment 1 Arterial Specimen   Glucose, capillary     Status: Abnormal   Collection Time: 02/09/15  2:01 AM  Result Value Ref Range   Glucose-Capillary 161 (H) 65 - 99 mg/dL   Comment 1 Capillary Specimen   Glucose, capillary     Status: Abnormal   Collection Time: 02/09/15  2:59 AM  Result Value Ref Range   Glucose-Capillary 182 (H) 65 - 99 mg/dL   Comment 1 Capillary Specimen   Glucose, capillary     Status: Abnormal   Collection Time: 02/09/15  4:05 AM  Result Value Ref Range   Glucose-Capillary 209 (H) 65 - 99 mg/dL   Comment 1 Capillary Specimen   Comprehensive metabolic panel     Status: Abnormal   Collection Time: 02/09/15  4:20 AM  Result Value Ref Range   Sodium 134 (L) 135 - 145 mmol/L    Comment: DELTA CHECK NOTED   Potassium 4.6 3.5 - 5.1 mmol/L    Comment: DELTA CHECK NOTED   Chloride 104 101 - 111 mmol/L   CO2 21 (L) 22 - 32 mmol/L   Glucose, Bld 226 (H) 65 - 99 mg/dL   BUN 17 6 - 20 mg/dL   Creatinine, Ser 1.26 (H) 0.44 - 1.00 mg/dL   Calcium 7.4 (L) 8.9 - 10.3 mg/dL   Total Protein 5.1 (L) 6.5 - 8.1 g/dL   Albumin 3.4 (L) 3.5 - 5.0 g/dL   AST 103 (H) 15 - 41 U/L   ALT 196 (H) 14 - 54 U/L   Alkaline Phosphatase 48 38 - 126 U/L   Total Bilirubin 0.9 0.3 - 1.2 mg/dL   GFR calc non Af Amer 51 (L) >60 mL/min   GFR  calc Af Amer 60 (L) >60 mL/min    Comment: (NOTE) The eGFR has been calculated using the CKD EPI equation. This calculation has not been validated in all clinical situations. eGFR's persistently <60 mL/min signify possible Chronic Kidney Disease.    Anion gap 9 5 - 15  Troponin I     Status: Abnormal   Collection Time: 02/09/15  4:20 AM  Result Value Ref Range   Troponin I 4.87 (HH) <0.031 ng/mL    Comment:        POSSIBLE MYOCARDIAL ISCHEMIA. SERIAL TESTING RECOMMENDED. CRITICAL VALUE NOTED.  VALUE IS CONSISTENT WITH PREVIOUSLY REPORTED AND CALLED VALUE.   CBC     Status: Abnormal   Collection Time: 02/09/15  4:20 AM  Result Value Ref Range   WBC 23.5 (H)  4.0 - 10.5 K/uL   RBC 3.00 (L) 3.87 - 5.11 MIL/uL   Hemoglobin 8.9 (L) 12.0 - 15.0 g/dL   HCT 26.0 (L) 36.0 - 46.0 %   MCV 86.7 78.0 - 100.0 fL   MCH 29.7 26.0 - 34.0 pg   MCHC 34.2 30.0 - 36.0 g/dL   RDW 15.4 11.5 - 15.5 %   Platelets 93 (L) 150 - 400 K/uL    Comment: CONSISTENT WITH PREVIOUS RESULT  CK total and CKMB (cardiac)not at [facility]     Status: Abnormal   Collection Time: 02/09/15  4:20 AM  Result Value Ref Range   Total CK 626 (H) 38 - 234 U/L   CK, MB 27.0 (H) 0.5 - 5.0 ng/mL   Relative Index 4.3 (H) 0.0 - 2.5  Carboxyhemoglobin     Status: Abnormal   Collection Time: 02/09/15  4:25 AM  Result Value Ref Range   Total hemoglobin 7.8 (L) 12.0 - 16.0 g/dL   O2 Saturation 54.3 %   Carboxyhemoglobin 1.0 0.5 - 1.5 %   Methemoglobin 1.0 0.0 - 1.5 %  Glucose, capillary     Status: Abnormal   Collection Time: 02/09/15  5:12 AM  Result Value Ref Range   Glucose-Capillary 203 (H) 65 - 99 mg/dL   Comment 1 Capillary Specimen   Glucose, capillary     Status: Abnormal   Collection Time: 02/09/15  6:04 AM  Result Value Ref Range   Glucose-Capillary 205 (H) 65 - 99 mg/dL   Comment 1 Capillary Specimen   Glucose, capillary     Status: Abnormal   Collection Time: 02/09/15  6:59 AM  Result Value Ref Range    Glucose-Capillary 179 (H) 65 - 99 mg/dL   Comment 1 Capillary Specimen   Glucose, capillary     Status: Abnormal   Collection Time: 02/09/15  8:04 AM  Result Value Ref Range   Glucose-Capillary 171 (H) 65 - 99 mg/dL  Glucose, capillary     Status: Abnormal   Collection Time: 02/09/15  8:50 AM  Result Value Ref Range   Glucose-Capillary 173 (H) 65 - 99 mg/dL  Glucose, capillary     Status: Abnormal   Collection Time: 02/09/15  9:51 AM  Result Value Ref Range   Glucose-Capillary 167 (H) 65 - 99 mg/dL  Glucose, capillary     Status: Abnormal   Collection Time: 02/09/15 10:45 AM  Result Value Ref Range   Glucose-Capillary 134 (H) 65 - 99 mg/dL  Glucose, capillary     Status: Abnormal   Collection Time: 02/09/15 11:49 AM  Result Value Ref Range   Glucose-Capillary 120 (H) 65 - 99 mg/dL  Glucose, capillary     Status: None   Collection Time: 02/09/15 12:53 PM  Result Value Ref Range   Glucose-Capillary 84 65 - 99 mg/dL  Glucose, capillary     Status: Abnormal   Collection Time: 02/09/15  2:00 PM  Result Value Ref Range   Glucose-Capillary 119 (H) 65 - 99 mg/dL  I-STAT, chem 8     Status: Abnormal   Collection Time: 02/09/15  4:25 PM  Result Value Ref Range   Sodium 135 135 - 145 mmol/L   Potassium 4.2 3.5 - 5.1 mmol/L   Chloride 99 (L) 101 - 111 mmol/L   BUN 21 (H) 6 - 20 mg/dL   Creatinine, Ser 1.00 0.44 - 1.00 mg/dL   Glucose, Bld 123 (H) 65 - 99 mg/dL   Calcium, Ion 1.04 (L) 1.12 -  1.23 mmol/L   TCO2 21 0 - 100 mmol/L   Hemoglobin 10.2 (L) 12.0 - 15.0 g/dL   HCT 30.0 (L) 36.0 - 46.0 %  Heparin level (unfractionated)     Status: None   Collection Time: 02/09/15  7:09 PM  Result Value Ref Range   Heparin Unfractionated 0.38 0.30 - 0.70 IU/mL    Comment:        IF HEPARIN RESULTS ARE BELOW EXPECTED VALUES, AND PATIENT DOSAGE HAS BEEN CONFIRMED, SUGGEST FOLLOW UP TESTING OF ANTITHROMBIN III LEVELS.   Glucose, capillary     Status: Abnormal   Collection Time: 02/09/15   8:03 PM  Result Value Ref Range   Glucose-Capillary 129 (H) 65 - 99 mg/dL   Comment 1 Arterial Specimen   Glucose, capillary     Status: Abnormal   Collection Time: 02/09/15  9:14 PM  Result Value Ref Range   Glucose-Capillary 161 (H) 65 - 99 mg/dL   Comment 1 Venous Specimen   Glucose, capillary     Status: Abnormal   Collection Time: 02/09/15 10:18 PM  Result Value Ref Range   Glucose-Capillary 155 (H) 65 - 99 mg/dL   Comment 1 Capillary Specimen   Glucose, capillary     Status: Abnormal   Collection Time: 02/09/15 10:59 PM  Result Value Ref Range   Glucose-Capillary 151 (H) 65 - 99 mg/dL   Comment 1 Capillary Specimen   Glucose, capillary     Status: Abnormal   Collection Time: 02/10/15 12:04 AM  Result Value Ref Range   Glucose-Capillary 145 (H) 65 - 99 mg/dL   Comment 1 Capillary Specimen   Comprehensive metabolic panel     Status: Abnormal   Collection Time: 02/10/15  4:00 AM  Result Value Ref Range   Sodium 130 (L) 135 - 145 mmol/L   Potassium 4.1 3.5 - 5.1 mmol/L   Chloride 100 (L) 101 - 111 mmol/L   CO2 23 22 - 32 mmol/L   Glucose, Bld 122 (H) 65 - 99 mg/dL   BUN 22 (H) 6 - 20 mg/dL   Creatinine, Ser 1.26 (H) 0.44 - 1.00 mg/dL   Calcium 6.9 (L) 8.9 - 10.3 mg/dL   Total Protein 5.1 (L) 6.5 - 8.1 g/dL   Albumin 3.1 (L) 3.5 - 5.0 g/dL   AST 96 (H) 15 - 41 U/L   ALT 185 (H) 14 - 54 U/L   Alkaline Phosphatase 67 38 - 126 U/L   Total Bilirubin 0.8 0.3 - 1.2 mg/dL   GFR calc non Af Amer 51 (L) >60 mL/min   GFR calc Af Amer 60 (L) >60 mL/min    Comment: (NOTE) The eGFR has been calculated using the CKD EPI equation. This calculation has not been validated in all clinical situations. eGFR's persistently <60 mL/min signify possible Chronic Kidney Disease.    Anion gap 7 5 - 15  Heparin level (unfractionated)     Status: None   Collection Time: 02/10/15  4:00 AM  Result Value Ref Range   Heparin Unfractionated 0.38 0.30 - 0.70 IU/mL    Comment:        IF HEPARIN  RESULTS ARE BELOW EXPECTED VALUES, AND PATIENT DOSAGE HAS BEEN CONFIRMED, SUGGEST FOLLOW UP TESTING OF ANTITHROMBIN III LEVELS.   CBC     Status: Abnormal   Collection Time: 02/10/15  4:00 AM  Result Value Ref Range   WBC 17.0 (H) 4.0 - 10.5 K/uL   RBC 3.05 (L) 3.87 - 5.11 MIL/uL  Hemoglobin 9.1 (L) 12.0 - 15.0 g/dL   HCT 26.7 (L) 36.0 - 46.0 %   MCV 87.5 78.0 - 100.0 fL   MCH 29.8 26.0 - 34.0 pg   MCHC 34.1 30.0 - 36.0 g/dL   RDW 15.6 (H) 11.5 - 15.5 %   Platelets 54 (L) 150 - 400 K/uL    Comment: SPECIMEN CHECKED FOR CLOTS REPEATED TO VERIFY CONSISTENT WITH PREVIOUS RESULT   Carboxyhemoglobin     Status: Abnormal   Collection Time: 02/10/15  4:07 AM  Result Value Ref Range   Total hemoglobin 9.2 (L) 12.0 - 16.0 g/dL   O2 Saturation 37.3 %   Carboxyhemoglobin 1.0 0.5 - 1.5 %   Methemoglobin 0.9 0.0 - 1.5 %  Blood gas, arterial     Status: Abnormal   Collection Time: 02/10/15  5:00 AM  Result Value Ref Range   FIO2 0.21    Delivery systems ROOM AIR    pH, Arterial 7.358 7.350 - 7.450   pCO2 arterial 37.7 35.0 - 45.0 mmHg   pO2, Arterial 63.2 (L) 80.0 - 100.0 mmHg   Bicarbonate 20.7 20.0 - 24.0 mEq/L   TCO2 21.8 0 - 100 mmol/L   Acid-base deficit 3.9 (H) 0.0 - 2.0 mmol/L   O2 Saturation 90.8 %   Patient temperature 98.6    Collection site RIGHT BRACHIAL    Drawn by [id number]    Sample type ARTERIAL DRAW    Allens test (pass/fail) PASS PASS    Imaging: Imaging results have been reviewed  Assessment/Plan:   1. Cardiogenic shock 2. Cardiac arrest 3. CAD with NSTEMI    -s/p CABG 9/29 4. Diabetes 5. Ventricular tachycardia  6. 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 Acute blood loss anemia   Had recurrent cardiogenic shock last night with very low co-ox. Now improved with increased inotropes. Will continue current regimen including IABP. Repeat co-ox. Add one dose of metolazone. Agree with echo tomorrow to reassess LV function. Try to concentrate all drips.   WBC coming down  on abx. TCTS changing to bival due to low PLTs. Suspect may be from IABP but agree with plan.   The patient is critically ill with multiple organ systems failure and requires high complexity decision making for assessment and support, frequent evaluation and titration of therapies, application of advanced monitoring technologies and extensive interpretation of multiple databases.   Critical Care Time devoted to patient care services described in this note is 35 Minutes.   Glori Bickers MD 02/10/2015, 10:55 AM

## 2015-02-10 NOTE — Progress Notes (Signed)
TCTS BRIEF SICU PROGRESS NOTE  R3 Days Post-Op S/P Procedure(s) (LRB): CORONARY ARTERY BYPASS GRAFTING (CABG) (N/A)  3 Days Post-Op  S/P Procedure(s) (LRB): IABP Insertion (N/A)   Stable day Sinus tach w/ stable hemodynamics, mixed venous co-ox up to 56.6% UOP dramatically improved  Plan: Continue current plan  Purcell Nails, MD 02/10/2015 6:10 PM

## 2015-02-10 NOTE — Plan of Care (Signed)
Problem: Phase II - Intermediate Post-Op Goal: Advance Diet Outcome: Not Progressing Pt with poor appetite and nausea today, meds changed

## 2015-02-11 ENCOUNTER — Ambulatory Visit (HOSPITAL_COMMUNITY): Payer: Medicaid Other

## 2015-02-11 ENCOUNTER — Inpatient Hospital Stay (HOSPITAL_COMMUNITY): Payer: Medicaid Other

## 2015-02-11 DIAGNOSIS — I2510 Atherosclerotic heart disease of native coronary artery without angina pectoris: Secondary | ICD-10-CM

## 2015-02-11 LAB — GLUCOSE, CAPILLARY
GLUCOSE-CAPILLARY: 97 mg/dL (ref 65–99)
GLUCOSE-CAPILLARY: 133 mg/dL — AB (ref 65–99)
GLUCOSE-CAPILLARY: 68 mg/dL (ref 65–99)
GLUCOSE-CAPILLARY: 107 mg/dL — AB (ref 65–99)
GLUCOSE-CAPILLARY: 96 mg/dL (ref 65–99)
GLUCOSE-CAPILLARY: 95 mg/dL (ref 65–99)
GLUCOSE-CAPILLARY: 115 mg/dL — AB (ref 65–99)
GLUCOSE-CAPILLARY: 111 mg/dL — AB (ref 65–99)
GLUCOSE-CAPILLARY: 121 mg/dL — AB (ref 65–99)
GLUCOSE-CAPILLARY: 92 mg/dL (ref 65–99)
GLUCOSE-CAPILLARY: 96 mg/dL (ref 65–99)
Glucose-Capillary: 113 mg/dL — ABNORMAL HIGH (ref 65–99)
Glucose-Capillary: 107 mg/dL — ABNORMAL HIGH (ref 65–99)
Glucose-Capillary: 127 mg/dL — ABNORMAL HIGH (ref 65–99)
Glucose-Capillary: 112 mg/dL — ABNORMAL HIGH (ref 65–99)
Glucose-Capillary: 105 mg/dL — ABNORMAL HIGH (ref 65–99)
Glucose-Capillary: 121 mg/dL — ABNORMAL HIGH (ref 65–99)

## 2015-02-11 LAB — POCT I-STAT, CHEM 8
BUN: 16 mg/dL (ref 6–20)
BUN: 17 mg/dL (ref 6–20)
BUN: 20 mg/dL (ref 6–20)
CALCIUM ION: 1.08 mmol/L — AB (ref 1.12–1.23)
CHLORIDE: 91 mmol/L — AB (ref 101–111)
CHLORIDE: 90 mmol/L — AB (ref 101–111)
CREATININE: 1.1 mg/dL — AB (ref 0.44–1.00)
Calcium, Ion: 1.05 mmol/L — ABNORMAL LOW (ref 1.12–1.23)
Calcium, Ion: 1.09 mmol/L — ABNORMAL LOW (ref 1.12–1.23)
Chloride: 93 mmol/L — ABNORMAL LOW (ref 101–111)
Creatinine, Ser: 1.2 mg/dL — ABNORMAL HIGH (ref 0.44–1.00)
Creatinine, Ser: 1.2 mg/dL — ABNORMAL HIGH (ref 0.44–1.00)
GLUCOSE: 128 mg/dL — AB (ref 65–99)
GLUCOSE: 163 mg/dL — AB (ref 65–99)
Glucose, Bld: 144 mg/dL — ABNORMAL HIGH (ref 65–99)
HEMATOCRIT: 28 % — AB (ref 36.0–46.0)
HEMATOCRIT: 32 % — AB (ref 36.0–46.0)
HEMATOCRIT: 34 % — AB (ref 36.0–46.0)
HEMOGLOBIN: 9.5 g/dL — AB (ref 12.0–15.0)
Hemoglobin: 11.6 g/dL — ABNORMAL LOW (ref 12.0–15.0)
Hemoglobin: 10.9 g/dL — ABNORMAL LOW (ref 12.0–15.0)
POTASSIUM: 3.7 mmol/L (ref 3.5–5.1)
POTASSIUM: 3.8 mmol/L (ref 3.5–5.1)
Potassium: 3.7 mmol/L (ref 3.5–5.1)
SODIUM: 135 mmol/L (ref 135–145)
Sodium: 133 mmol/L — ABNORMAL LOW (ref 135–145)
Sodium: 133 mmol/L — ABNORMAL LOW (ref 135–145)
TCO2: 28 mmol/L (ref 0–100)
TCO2: 28 mmol/L (ref 0–100)
TCO2: 30 mmol/L (ref 0–100)

## 2015-02-11 LAB — CBC
HCT: 27.9 % — ABNORMAL LOW (ref 36.0–46.0)
Hemoglobin: 9.3 g/dL — ABNORMAL LOW (ref 12.0–15.0)
MCH: 28.7 pg (ref 26.0–34.0)
MCHC: 33.3 g/dL (ref 30.0–36.0)
MCV: 86.1 fL (ref 78.0–100.0)
Platelets: 66 10*3/uL — ABNORMAL LOW (ref 150–400)
RBC: 3.24 MIL/uL — ABNORMAL LOW (ref 3.87–5.11)
RDW: 15 % (ref 11.5–15.5)
WBC: 14.2 10*3/uL — ABNORMAL HIGH (ref 4.0–10.5)

## 2015-02-11 LAB — BLOOD GAS, ARTERIAL
Acid-Base Excess: 6.9 mmol/L — ABNORMAL HIGH (ref 0.0–2.0)
Bicarbonate: 30.8 mEq/L — ABNORMAL HIGH (ref 20.0–24.0)
DRAWN BY: 362771
FIO2: 0.21
O2 Saturation: 88.4 %
PATIENT TEMPERATURE: 98.6
PH ART: 7.473 — AB (ref 7.350–7.450)
TCO2: 32.1 mmol/L (ref 0–100)
pCO2 arterial: 42.5 mmHg (ref 35.0–45.0)
pO2, Arterial: 55.2 mmHg — ABNORMAL LOW (ref 80.0–100.0)

## 2015-02-11 LAB — POCT I-STAT 7, (LYTES, BLD GAS, ICA,H+H)
Acid-Base Excess: 2 mmol/L (ref 0.0–2.0)
BICARBONATE: 27.2 meq/L — AB (ref 20.0–24.0)
CALCIUM ION: 1.22 mmol/L (ref 1.12–1.23)
HCT: 24 % — ABNORMAL LOW (ref 36.0–46.0)
Hemoglobin: 8.2 g/dL — ABNORMAL LOW (ref 12.0–15.0)
O2 Saturation: 100 %
PCO2 ART: 39.3 mmHg (ref 35.0–45.0)
PO2 ART: 433 mmHg — AB (ref 80.0–100.0)
Patient temperature: 34.7
Potassium: 3.5 mmol/L (ref 3.5–5.1)
SODIUM: 142 mmol/L (ref 135–145)
TCO2: 29 mmol/L (ref 0–100)
pH, Arterial: 7.439 (ref 7.350–7.450)

## 2015-02-11 LAB — CARBOXYHEMOGLOBIN
Carboxyhemoglobin: 1.6 % — ABNORMAL HIGH (ref 0.5–1.5)
Methemoglobin: 0.9 % (ref 0.0–1.5)
O2 Saturation: 58.9 %
Total hemoglobin: 9.4 g/dL — ABNORMAL LOW (ref 12.0–16.0)

## 2015-02-11 LAB — COMPREHENSIVE METABOLIC PANEL
ALT: 176 U/L — ABNORMAL HIGH (ref 14–54)
AST: 78 U/L — ABNORMAL HIGH (ref 15–41)
Albumin: 3.3 g/dL — ABNORMAL LOW (ref 3.5–5.0)
Alkaline Phosphatase: 75 U/L (ref 38–126)
Anion gap: 8 (ref 5–15)
BUN: 17 mg/dL (ref 6–20)
CO2: 32 mmol/L (ref 22–32)
Calcium: 8.1 mg/dL — ABNORMAL LOW (ref 8.9–10.3)
Chloride: 95 mmol/L — ABNORMAL LOW (ref 101–111)
Creatinine, Ser: 1.11 mg/dL — ABNORMAL HIGH (ref 0.44–1.00)
GFR calc Af Amer: >60 mL/min (ref >60)
GFR calc non Af Amer: 60 mL/min — ABNORMAL LOW (ref >60)
Glucose, Bld: 122 mg/dL — ABNORMAL HIGH (ref 65–99)
Potassium: 3.4 mmol/L — ABNORMAL LOW (ref 3.5–5.1)
Sodium: 135 mmol/L (ref 135–145)
Total Bilirubin: 0.7 mg/dL (ref 0.3–1.2)
Total Protein: 5.8 g/dL — ABNORMAL LOW (ref 6.5–8.1)

## 2015-02-11 LAB — APTT
aPTT: 85 seconds — ABNORMAL HIGH (ref 24–37)
aPTT: 73 seconds — ABNORMAL HIGH (ref 24–37)

## 2015-02-11 LAB — HEPARIN INDUCED PLATELET AB (HIT ANTIBODY): Heparin Induced Plt Ab: 2.878 OD — ABNORMAL HIGH (ref 0.000–0.400)

## 2015-02-11 MED ORDER — INSULIN ASPART 100 UNIT/ML ~~LOC~~ SOLN
0.0000 [IU] | SUBCUTANEOUS | Status: DC
  Administered 2015-02-12: 2 [IU] via SUBCUTANEOUS
  Administered 2015-02-12 (×2): 4 [IU] via SUBCUTANEOUS
  Administered 2015-02-12: 8 [IU] via SUBCUTANEOUS
  Administered 2015-02-12 – 2015-02-13 (×3): 2 [IU] via SUBCUTANEOUS
  Administered 2015-02-13: 8 [IU] via SUBCUTANEOUS
  Administered 2015-02-13 (×3): 4 [IU] via SUBCUTANEOUS
  Administered 2015-02-14: 2 [IU] via SUBCUTANEOUS

## 2015-02-11 MED ORDER — INSULIN DETEMIR 100 UNIT/ML ~~LOC~~ SOLN
20.0000 [IU] | Freq: Two times a day (BID) | SUBCUTANEOUS | Status: DC
  Administered 2015-02-11 – 2015-02-16 (×10): 20 [IU] via SUBCUTANEOUS
  Filled 2015-02-11 (×13): qty 0.2

## 2015-02-11 MED ORDER — SODIUM CHLORIDE 0.9 % IJ SOLN
10.0000 mL | Freq: Two times a day (BID) | INTRAMUSCULAR | Status: DC
  Administered 2015-02-11 – 2015-02-12 (×2): 40 mL
  Administered 2015-02-12 – 2015-02-15 (×6): 10 mL

## 2015-02-11 MED ORDER — SODIUM CHLORIDE 0.9 % IV SOLN
0.0500 mg/kg/h | INTRAVENOUS | Status: AC
  Administered 2015-02-11: 0.1 mg/kg/h via INTRAVENOUS
  Filled 2015-02-11: qty 250

## 2015-02-11 MED ORDER — LEVALBUTEROL HCL 1.25 MG/0.5ML IN NEBU
1.2500 mg | INHALATION_SOLUTION | Freq: Four times a day (QID) | RESPIRATORY_TRACT | Status: DC | PRN
  Filled 2015-02-11: qty 0.5

## 2015-02-11 MED ORDER — POTASSIUM CHLORIDE 10 MEQ/50ML IV SOLN
10.0000 meq | INTRAVENOUS | Status: AC
  Administered 2015-02-11 (×3): 10 meq via INTRAVENOUS
  Filled 2015-02-11 (×3): qty 50

## 2015-02-11 MED ORDER — SODIUM CHLORIDE 0.9 % IJ SOLN
10.0000 mL | INTRAMUSCULAR | Status: DC | PRN
  Administered 2015-02-12: 40 mL
  Filled 2015-02-11: qty 40

## 2015-02-11 MED ORDER — POTASSIUM CHLORIDE 10 MEQ/50ML IV SOLN
10.0000 meq | INTRAVENOUS | Status: AC | PRN
  Administered 2015-02-11 (×3): 10 meq via INTRAVENOUS
  Filled 2015-02-11 (×3): qty 50

## 2015-02-11 MED ORDER — AMIODARONE HCL 200 MG PO TABS
400.0000 mg | ORAL_TABLET | Freq: Two times a day (BID) | ORAL | Status: DC
  Administered 2015-02-11 – 2015-02-12 (×3): 400 mg via ORAL
  Filled 2015-02-11 (×4): qty 2

## 2015-02-11 NOTE — Progress Notes (Signed)
ANTICOAGULATION CONSULT NOTE - Follow up Consult  Pharmacy Consult for bivalirudin Indication:  IABP  No Known Allergies  Patient Measurements: Height:  (160 cm) Weight: 182 lb 1.6 oz (82.6 kg) IBW/kg (Calculated) : 52.4   Vital Signs: Temp: 98.6 F (37 C) (10/03 2100) BP: 111/49 mmHg (10/03 2100) Pulse Rate: 114 (10/03 2100)  Labs:  Recent Labs  02/09/15 0420  02/09/15 1909 02/10/15 0400  02/10/15 1816  02/11/15 0350 02/11/15 1012 02/11/15 1710 02/11/15 2120  HGB 8.9*  < >  --  9.1*  < >  --   < > 9.3* 11.6* 10.9*  --   HCT 26.0*  < >  --  26.7*  < >  --   < > 27.9* 34.0* 32.0*  --   PLT 93*  --   --  54*  --   --   --  66*  --   --   --   APTT  --   --   --   --   < > 77*  --  85*  --   --  73*  HEPARINUNFRC  --   --  0.38 0.38  --   --   --   --   --   --   --   CREATININE 1.26*  < >  --  1.26*  < >  --   < > 1.11* 1.10* 1.20*  --   CKTOTAL 626*  --   --   --   --   --   --   --   --   --   --   CKMB 27.0*  --   --   --   --   --   --   --   --   --   --   TROPONINI 4.87*  --   --   --   --   --   --   --   --   --   --   < > = values in this interval not displayed.  Estimated Creatinine Clearance: 61.6 mL/min (by C-G formula based on Cr of 1.2).   Medications: Bivalirudin @ 0.05mg /kg/hr  Assessment: [name] POD #4 CABG x 3 complicated by PEA arrest/VT/cardiogenic shock with IABP insertion continues on bivalirudin (initially on heparin but switched to bivalirudin due to drop in platelets, HIT panel pending).   Nursing noted bleeding from IV site, pressure applied for >10min. bival dose adjusted by surgery to 0.05mg /kg/hr with stop time updated for tomorrow morning. Checked aptt tonight which was within therapeutic range at 73s. Will check aptt prior to turning off infusion and follow up post pump removal. Goal of Therapy: APTT 50-85 seconds  Plan: 1) Continue bivalirudin @ 0.05mg /kg/hr 2) Daily APTT 3) Follow up HIT panel  Sheppard Coil PharmD.,  BCPS Clinical Pharmacist Pager [phone] 02/11/2015 10:04 PM

## 2015-02-11 NOTE — Telephone Encounter (Signed)
Has this been taken care of and can this encounter be closed?

## 2015-02-11 NOTE — Progress Notes (Signed)
Subjective:  POD #4 CABG X 3 complicated by PEA arrest/VT/ Shock/ CPR/IABP insertion  Epi @ 4 Levophed @ 6 Milrinone @ 0.375 with IABP 1:3 . Lasix drip @ 6 mg per hour with excellent diuresis. Weight down 13 pounds  Yesterday she developed recurrent cardiogenic shock with epi and levo increased.   Denies SOB/Orthopnea. Pain controlled.    PA 26/14 Thermo 4.3/2.29  Objective:  Temp:  [93.6 F (34.2 C)-99.1 F (37.3 C)] 98.8 F (37.1 C) (10/03 1200) Pulse Rate:  [52-116] 58 (10/03 1100) Resp:  [0-26] 13 (10/03 1200) BP: (90-133)/(46-94) 109/57 mmHg (10/03 1200) SpO2:  [95 %-100 %] 100 % (10/03 1100) Weight:  [182 lb 1.6 oz (82.6 kg)] 182 lb 1.6 oz (82.6 kg) (10/03 0451) Weight change: -13 lb 14.2 oz (-6.3 kg)  Intake/Output from previous day: 10/02 0701 - 10/03 0700 In: 3488.4 [P.O.:240; I.V.:2498.4; IV Piggyback:750] Out: 83662 [Urine:10565; Chest Tube:240]  Intake/Output from this shift: Total I/O In: 564.1 [I.V.:464.1; IV Piggyback:100] Out: 2925 [Urine:2925]  Physical Exam: General appearance: awake. conversant Neck: supple RIJ swan Lungs: clear Heart: regular rate and rhythm, S1, S2 normal, no murmur, click, rub or gallop Extremities: extremities normal, atraumatic, no cyanosisand IABP on Left, 1+ LPP. Mild edema. Able to doppler R and L pedal pulse.   EKG: Sinus Tach 110s.   Lab Results: Results for orders placed or performed during the hospital encounter of 02/01/15 (from the past 48 hour(s))  Glucose, capillary     Status: None   Collection Time: 02/09/15 12:53 PM  Result Value Ref Range   Glucose-Capillary 84 65 - 99 mg/dL  Glucose, capillary     Status: Abnormal   Collection Time: 02/09/15  2:00 PM  Result Value Ref Range   Glucose-Capillary 119 (H) 65 - 99 mg/dL  Glucose, capillary     Status: None   Collection Time: 02/09/15  3:07 PM  Result Value Ref Range   Glucose-Capillary 97 65 - 99 mg/dL  Glucose, capillary     Status: Abnormal   Collection Time: 02/09/15  4:10 PM  Result Value Ref Range   Glucose-Capillary 113 (H) 65 - 99 mg/dL  I-STAT, chem 8     Status: Abnormal   Collection Time: 02/09/15  4:25 PM  Result Value Ref Range   Sodium 135 135 - 145 mmol/L   Potassium 4.2 3.5 - 5.1 mmol/L   Chloride 99 (L) 101 - 111 mmol/L   BUN 21 (H) 6 - 20 mg/dL   Creatinine, Ser 1.00 0.44 - 1.00 mg/dL   Glucose, Bld 123 (H) 65 - 99 mg/dL   Calcium, Ion 1.04 (L) 1.12 - 1.23 mmol/L   TCO2 21 0 - 100 mmol/L   Hemoglobin 10.2 (L) 12.0 - 15.0 g/dL   HCT 30.0 (L) 36.0 - 46.0 %  Glucose, capillary     Status: Abnormal   Collection Time: 02/09/15  5:07 PM  Result Value Ref Range   Glucose-Capillary 133 (H) 65 - 99 mg/dL  Glucose, capillary     Status: None   Collection Time: 02/09/15  6:04 PM  Result Value Ref Range   Glucose-Capillary 68 65 - 99 mg/dL   Comment 1 Venous Specimen   Glucose, capillary     Status: Abnormal   Collection Time: 02/09/15  6:31 PM  Result Value Ref Range   Glucose-Capillary 107 (H) 65 - 99 mg/dL  Glucose, capillary     Status: Abnormal   Collection Time: 02/09/15  6:55 PM  Result Value Ref Range   Glucose-Capillary 127 (H) 65 - 99 mg/dL  Heparin level (unfractionated)     Status: None   Collection Time: 02/09/15  7:09 PM  Result Value Ref Range   Heparin Unfractionated 0.38 0.30 - 0.70 IU/mL    Comment:        IF HEPARIN RESULTS ARE BELOW EXPECTED VALUES, AND PATIENT DOSAGE HAS BEEN CONFIRMED, SUGGEST FOLLOW UP TESTING OF ANTITHROMBIN III LEVELS.   Glucose, capillary     Status: Abnormal   Collection Time: 02/09/15  8:03 PM  Result Value Ref Range   Glucose-Capillary 129 (H) 65 - 99 mg/dL   Comment 1 Arterial Specimen   Glucose, capillary     Status: Abnormal   Collection Time: 02/09/15  9:14 PM  Result Value Ref Range   Glucose-Capillary 161 (H) 65 - 99 mg/dL   Comment 1 Venous Specimen   Glucose, capillary     Status: Abnormal   Collection Time: 02/09/15 10:18 PM  Result Value Ref  Range   Glucose-Capillary 155 (H) 65 - 99 mg/dL   Comment 1 Capillary Specimen   Glucose, capillary     Status: Abnormal   Collection Time: 02/09/15 10:59 PM  Result Value Ref Range   Glucose-Capillary 151 (H) 65 - 99 mg/dL   Comment 1 Capillary Specimen   Glucose, capillary     Status: Abnormal   Collection Time: 02/10/15 12:04 AM  Result Value Ref Range   Glucose-Capillary 145 (H) 65 - 99 mg/dL   Comment 1 Capillary Specimen   Glucose, capillary     Status: Abnormal   Collection Time: 02/10/15  1:08 AM  Result Value Ref Range   Glucose-Capillary 148 (H) 65 - 99 mg/dL   Comment 1 Capillary Specimen   Glucose, capillary     Status: Abnormal   Collection Time: 02/10/15  1:59 AM  Result Value Ref Range   Glucose-Capillary 146 (H) 65 - 99 mg/dL   Comment 1 Capillary Specimen   Glucose, capillary     Status: Abnormal   Collection Time: 02/10/15  3:02 AM  Result Value Ref Range   Glucose-Capillary 130 (H) 65 - 99 mg/dL   Comment 1 Capillary Specimen   Comprehensive metabolic panel     Status: Abnormal   Collection Time: 02/10/15  4:00 AM  Result Value Ref Range   Sodium 130 (L) 135 - 145 mmol/L   Potassium 4.1 3.5 - 5.1 mmol/L   Chloride 100 (L) 101 - 111 mmol/L   CO2 23 22 - 32 mmol/L   Glucose, Bld 122 (H) 65 - 99 mg/dL   BUN 22 (H) 6 - 20 mg/dL   Creatinine, Ser 1.26 (H) 0.44 - 1.00 mg/dL   Calcium 6.9 (L) 8.9 - 10.3 mg/dL   Total Protein 5.1 (L) 6.5 - 8.1 g/dL   Albumin 3.1 (L) 3.5 - 5.0 g/dL   AST 96 (H) 15 - 41 U/L   ALT 185 (H) 14 - 54 U/L   Alkaline Phosphatase 67 38 - 126 U/L   Total Bilirubin 0.8 0.3 - 1.2 mg/dL   GFR calc non Af Amer 51 (L) >60 mL/min   GFR calc Af Amer 60 (L) >60 mL/min    Comment: (NOTE) The eGFR has been calculated using the CKD EPI equation. This calculation has not been validated in all clinical situations. eGFR's persistently <60 mL/min signify possible Chronic Kidney Disease.    Anion gap 7 5 - 15  Heparin level (unfractionated)  Status: None   Collection Time: 02/10/15  4:00 AM  Result Value Ref Range   Heparin Unfractionated 0.38 0.30 - 0.70 IU/mL    Comment:        IF HEPARIN RESULTS ARE BELOW EXPECTED VALUES, AND PATIENT DOSAGE HAS BEEN CONFIRMED, SUGGEST FOLLOW UP TESTING OF ANTITHROMBIN III LEVELS.   CBC     Status: Abnormal   Collection Time: 02/10/15  4:00 AM  Result Value Ref Range   WBC 17.0 (H) 4.0 - 10.5 K/uL   RBC 3.05 (L) 3.87 - 5.11 MIL/uL   Hemoglobin 9.1 (L) 12.0 - 15.0 g/dL   HCT 26.7 (L) 36.0 - 46.0 %   MCV 87.5 78.0 - 100.0 fL   MCH 29.8 26.0 - 34.0 pg   MCHC 34.1 30.0 - 36.0 g/dL   RDW 15.6 (H) 11.5 - 15.5 %   Platelets 54 (L) 150 - 400 K/uL    Comment: SPECIMEN CHECKED FOR CLOTS REPEATED TO VERIFY CONSISTENT WITH PREVIOUS RESULT   Glucose, capillary     Status: Abnormal   Collection Time: 02/10/15  4:03 AM  Result Value Ref Range   Glucose-Capillary 114 (H) 65 - 99 mg/dL   Comment 1 Venous Specimen   Carboxyhemoglobin     Status: Abnormal   Collection Time: 02/10/15  4:07 AM  Result Value Ref Range   Total hemoglobin 9.2 (L) 12.0 - 16.0 g/dL   O2 Saturation 37.3 %   Carboxyhemoglobin 1.0 0.5 - 1.5 %   Methemoglobin 0.9 0.0 - 1.5 %  Blood gas, arterial     Status: Abnormal   Collection Time: 02/10/15  5:00 AM  Result Value Ref Range   FIO2 0.21    Delivery systems ROOM AIR    pH, Arterial 7.358 7.350 - 7.450   pCO2 arterial 37.7 35.0 - 45.0 mmHg   pO2, Arterial 63.2 (L) 80.0 - 100.0 mmHg   Bicarbonate 20.7 20.0 - 24.0 mEq/L   TCO2 21.8 0 - 100 mmol/L   Acid-base deficit 3.9 (H) 0.0 - 2.0 mmol/L   O2 Saturation 90.8 %   Patient temperature 98.6    Collection site RIGHT BRACHIAL    Drawn by [phone]    Sample type ARTERIAL DRAW    Allens test (pass/fail) PASS PASS  Glucose, capillary     Status: None   Collection Time: 02/10/15  5:01 AM  Result Value Ref Range   Glucose-Capillary 97 65 - 99 mg/dL  Glucose, capillary     Status: None   Collection Time: 02/10/15   6:15 AM  Result Value Ref Range   Glucose-Capillary 81 65 - 99 mg/dL   Comment 1 Capillary Specimen   Glucose, capillary     Status: None   Collection Time: 02/10/15  7:17 AM  Result Value Ref Range   Glucose-Capillary 74 65 - 99 mg/dL   Comment 1 Capillary Specimen   Glucose, capillary     Status: Abnormal   Collection Time: 02/10/15  8:05 AM  Result Value Ref Range   Glucose-Capillary 111 (H) 65 - 99 mg/dL  Glucose, capillary     Status: None   Collection Time: 02/10/15  8:49 AM  Result Value Ref Range   Glucose-Capillary 72 65 - 99 mg/dL   Comment 1 Venous Specimen   Glucose, capillary     Status: Abnormal   Collection Time: 02/10/15 10:02 AM  Result Value Ref Range   Glucose-Capillary 132 (H) 65 - 99 mg/dL  Glucose, capillary  Status: Abnormal   Collection Time: 02/10/15 11:19 AM  Result Value Ref Range   Glucose-Capillary 143 (H) 65 - 99 mg/dL  Carboxyhemoglobin     Status: None   Collection Time: 02/10/15 11:42 AM  Result Value Ref Range   Total hemoglobin 12.3 12.0 - 16.0 g/dL   O2 Saturation 51.1 %   Carboxyhemoglobin 1.3 0.5 - 1.5 %   Methemoglobin 0.8 0.0 - 1.5 %  Glucose, capillary     Status: Abnormal   Collection Time: 02/10/15 11:47 AM  Result Value Ref Range   Glucose-Capillary 151 (H) 65 - 99 mg/dL  Glucose, capillary     Status: Abnormal   Collection Time: 02/10/15 12:55 PM  Result Value Ref Range   Glucose-Capillary 151 (H) 65 - 99 mg/dL  Glucose, capillary     Status: Abnormal   Collection Time: 02/10/15  2:00 PM  Result Value Ref Range   Glucose-Capillary 153 (H) 65 - 99 mg/dL  APTT     Status: Abnormal   Collection Time: 02/10/15  2:06 PM  Result Value Ref Range   aPTT 74 (H) 24 - 37 seconds    Comment:        IF BASELINE aPTT IS ELEVATED, SUGGEST PATIENT RISK ASSESSMENT BE USED TO DETERMINE APPROPRIATE ANTICOAGULANT THERAPY.   Glucose, capillary     Status: Abnormal   Collection Time: 02/10/15  2:47 PM  Result Value Ref Range    Glucose-Capillary 123 (H) 65 - 99 mg/dL  Carboxyhemoglobin     Status: Abnormal   Collection Time: 02/10/15  2:55 PM  Result Value Ref Range   Total hemoglobin 9.8 (L) 12.0 - 16.0 g/dL   O2 Saturation 56.6 %   Carboxyhemoglobin 1.5 0.5 - 1.5 %   Methemoglobin 0.8 0.0 - 1.5 %  Glucose, capillary     Status: Abnormal   Collection Time: 02/10/15  3:51 PM  Result Value Ref Range   Glucose-Capillary 110 (H) 65 - 99 mg/dL  Glucose, capillary     Status: Abnormal   Collection Time: 02/10/15  4:57 PM  Result Value Ref Range   Glucose-Capillary 103 (H) 65 - 99 mg/dL  I-STAT, chem 8     Status: Abnormal   Collection Time: 02/10/15  5:05 PM  Result Value Ref Range   Sodium 132 (L) 135 - 145 mmol/L   Potassium 3.3 (L) 3.5 - 5.1 mmol/L   Chloride 97 (L) 101 - 111 mmol/L   BUN 22 (H) 6 - 20 mg/dL   Creatinine, Ser 1.20 (H) 0.44 - 1.00 mg/dL   Glucose, Bld 113 (H) 65 - 99 mg/dL   Calcium, Ion 1.09 (L) 1.12 - 1.23 mmol/L   TCO2 25 0 - 100 mmol/L   Hemoglobin 11.2 (L) 12.0 - 15.0 g/dL   HCT 33.0 (L) 36.0 - 46.0 %  Glucose, capillary     Status: Abnormal   Collection Time: 02/10/15  5:59 PM  Result Value Ref Range   Glucose-Capillary 100 (H) 65 - 99 mg/dL  APTT     Status: Abnormal   Collection Time: 02/10/15  6:16 PM  Result Value Ref Range   aPTT 77 (H) 24 - 37 seconds    Comment:        IF BASELINE aPTT IS ELEVATED, SUGGEST PATIENT RISK ASSESSMENT BE USED TO DETERMINE APPROPRIATE ANTICOAGULANT THERAPY.   Glucose, capillary     Status: Abnormal   Collection Time: 02/10/15  6:54 PM  Result Value Ref Range   Glucose-Capillary 112 (  H) 65 - 99 mg/dL  Glucose, capillary     Status: Abnormal   Collection Time: 02/10/15  8:08 PM  Result Value Ref Range   Glucose-Capillary 107 (H) 65 - 99 mg/dL   Comment 1 Venous Specimen   Glucose, capillary     Status: None   Collection Time: 02/10/15  9:06 PM  Result Value Ref Range   Glucose-Capillary 96 65 - 99 mg/dL   Comment 1 Venous Specimen     Glucose, capillary     Status: None   Collection Time: 02/10/15 10:01 PM  Result Value Ref Range   Glucose-Capillary 95 65 - 99 mg/dL  Glucose, capillary     Status: Abnormal   Collection Time: 02/10/15 11:06 PM  Result Value Ref Range   Glucose-Capillary 105 (H) 65 - 99 mg/dL   Comment 1 Venous Specimen   I-STAT, chem 8     Status: Abnormal   Collection Time: 02/11/15 12:03 AM  Result Value Ref Range   Sodium 133 (L) 135 - 145 mmol/L   Potassium 3.8 3.5 - 5.1 mmol/L   Chloride 93 (L) 101 - 111 mmol/L   BUN 20 6 - 20 mg/dL   Creatinine, Ser 1.20 (H) 0.44 - 1.00 mg/dL   Glucose, Bld 163 (H) 65 - 99 mg/dL   Calcium, Ion 1.05 (L) 1.12 - 1.23 mmol/L   TCO2 28 0 - 100 mmol/L   Hemoglobin 9.5 (L) 12.0 - 15.0 g/dL   HCT 28.0 (L) 36.0 - 46.0 %  Glucose, capillary     Status: Abnormal   Collection Time: 02/11/15  1:06 AM  Result Value Ref Range   Glucose-Capillary 115 (H) 65 - 99 mg/dL   Comment 1 Venous Specimen   Glucose, capillary     Status: Abnormal   Collection Time: 02/11/15  1:57 AM  Result Value Ref Range   Glucose-Capillary 121 (H) 65 - 99 mg/dL   Comment 1 Venous Specimen   Glucose, capillary     Status: Abnormal   Collection Time: 02/11/15  3:02 AM  Result Value Ref Range   Glucose-Capillary 111 (H) 65 - 99 mg/dL   Comment 1 Capillary Specimen   Comprehensive metabolic panel     Status: Abnormal   Collection Time: 02/11/15  3:50 AM  Result Value Ref Range   Sodium 135 135 - 145 mmol/L   Potassium 3.4 (L) 3.5 - 5.1 mmol/L   Chloride 95 (L) 101 - 111 mmol/L   CO2 32 22 - 32 mmol/L   Glucose, Bld 122 (H) 65 - 99 mg/dL   BUN 17 6 - 20 mg/dL   Creatinine, Ser 1.11 (H) 0.44 - 1.00 mg/dL   Calcium 8.1 (L) 8.9 - 10.3 mg/dL   Total Protein 5.8 (L) 6.5 - 8.1 g/dL   Albumin 3.3 (L) 3.5 - 5.0 g/dL   AST 78 (H) 15 - 41 U/L   ALT 176 (H) 14 - 54 U/L   Alkaline Phosphatase 75 38 - 126 U/L   Total Bilirubin 0.7 0.3 - 1.2 mg/dL   GFR calc non Af Amer 60 (L) >60 mL/min   GFR  calc Af Amer >60 >60 mL/min    Comment: (NOTE) The eGFR has been calculated using the CKD EPI equation. This calculation has not been validated in all clinical situations. eGFR's persistently <60 mL/min signify possible Chronic Kidney Disease.    Anion gap 8 5 - 15  CBC     Status: Abnormal   Collection Time: 02/11/15  3:50 AM  Result Value Ref Range   WBC 14.2 (H) 4.0 - 10.5 K/uL   RBC 3.24 (L) 3.87 - 5.11 MIL/uL   Hemoglobin 9.3 (L) 12.0 - 15.0 g/dL   HCT 27.9 (L) 36.0 - 46.0 %   MCV 86.1 78.0 - 100.0 fL   MCH 28.7 26.0 - 34.0 pg   MCHC 33.3 30.0 - 36.0 g/dL   RDW 15.0 11.5 - 15.5 %   Platelets 66 (L) 150 - 400 K/uL    Comment: SPECIMEN CHECKED FOR CLOTS REPEATED TO VERIFY CONSISTENT WITH PREVIOUS RESULT   APTT     Status: Abnormal   Collection Time: 02/11/15  3:50 AM  Result Value Ref Range   aPTT 85 (H) 24 - 37 seconds    Comment:        IF BASELINE aPTT IS ELEVATED, SUGGEST PATIENT RISK ASSESSMENT BE USED TO DETERMINE APPROPRIATE ANTICOAGULANT THERAPY.   Glucose, capillary     Status: Abnormal   Collection Time: 02/11/15  3:55 AM  Result Value Ref Range   Glucose-Capillary 121 (H) 65 - 99 mg/dL   Comment 1 Venous Specimen   Carboxyhemoglobin     Status: Abnormal   Collection Time: 02/11/15  4:00 AM  Result Value Ref Range   Total hemoglobin 9.4 (L) 12.0 - 16.0 g/dL   O2 Saturation 58.9 %   Carboxyhemoglobin 1.6 (H) 0.5 - 1.5 %   Methemoglobin 0.9 0.0 - 1.5 %  Glucose, capillary     Status: None   Collection Time: 02/11/15  4:59 AM  Result Value Ref Range   Glucose-Capillary 92 65 - 99 mg/dL   Comment 1 Capillary Specimen   Blood gas, arterial     Status: Abnormal   Collection Time: 02/11/15  5:45 AM  Result Value Ref Range   FIO2 0.21    pH, Arterial 7.473 (H) 7.350 - 7.450   pCO2 arterial 42.5 35.0 - 45.0 mmHg   pO2, Arterial 55.2 (L) 80.0 - 100.0 mmHg   Bicarbonate 30.8 (H) 20.0 - 24.0 mEq/L   TCO2 32.1 0 - 100 mmol/L   Acid-Base Excess 6.9 (H) 0.0 -  2.0 mmol/L   O2 Saturation 88.4 %   Patient temperature 98.6    Collection site LEFT RADIAL     Comment: CORRECTED ON 10/03 AT 0920: PREVIOUSLY REPORTED AS BRACHIAL ARTERY   Drawn by [id number]    Sample type ARTERIAL DRAW    Allens test (pass/fail) PASS PASS  I-STAT, chem 8     Status: Abnormal   Collection Time: 02/11/15 10:12 AM  Result Value Ref Range   Sodium 133 (L) 135 - 145 mmol/L   Potassium 3.7 3.5 - 5.1 mmol/L   Chloride 91 (L) 101 - 111 mmol/L   BUN 16 6 - 20 mg/dL   Creatinine, Ser 1.10 (H) 0.44 - 1.00 mg/dL   Glucose, Bld 128 (H) 65 - 99 mg/dL   Calcium, Ion 1.09 (L) 1.12 - 1.23 mmol/L   TCO2 28 0 - 100 mmol/L   Hemoglobin 11.6 (L) 12.0 - 15.0 g/dL   HCT 34.0 (L) 36.0 - 46.0 %    Imaging: Imaging results have been reviewed  Assessment/Plan:   1. Cardiogenic shock 2. Cardiac arrest 3. CAD with NSTEMI    -s/p CABG 9/29 4. Diabetes 5. Ventricular tachycardia  6. 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 Acute blood loss anemia   Todays CO-OX is 59%. Remains epi, milrinone, norepi.  Weaning epi. IABP 1:3  And likely  removing tomorrow   ECHO today EF 45-50% Grade I DD RV normal   WBC coming down on abx. Now on bival due to low PLTs. Possibly from IABP.     CLEGG,AMY NP-C  02/11/2015, 12:37 PM   Patient seen and examined with Darrick Grinder, NP. We discussed all aspects of the encounter. I agree with the assessment and plan as stated above.   She is much improved. IABP now 1:3. Outputs good. Excellent diuresis on lasix gtt. Echo EF 45%.  Agree with keeping IABP in overnight. Wean epi as tolerated. Continue milrinone and norepi.  Cut back lasix. Continue vancomycin for now.   The patient is critically ill with multiple organ systems failure and requires high complexity decision making for assessment and support, frequent evaluation and titration of therapies, application of advanced monitoring technologies and extensive interpretation of multiple databases.   Critical Care Time  devoted to patient care services described in this note is 35 Minutes.  Kortnee Bas,MD 5:50 PM

## 2015-02-11 NOTE — Progress Notes (Signed)
Peripherally Inserted Central Catheter/Midline Placement  The IV Nurse has discussed with the patient and/or persons authorized to consent for the patient, the purpose of this procedure and the potential benefits and risks involved with this procedure.  The benefits include less needle sticks, lab draws from the catheter and patient may be discharged home with the catheter.  Risks include, but not limited to, infection, bleeding, blood clot (thrombus formation), and puncture of an artery; nerve damage and irregular heat beat.  Alternatives to this procedure were also discussed.  PICC/Midline Placement Documentation        Timmothy Sours 02/11/2015, 3:00 PM

## 2015-02-11 NOTE — Progress Notes (Signed)
ANTICOAGULATION CONSULT NOTE - Follow up Consult  Pharmacy Consult for bivalirudin Indication:  IABP  No Known Allergies  Patient Measurements: Height:  (160 cm) Weight: 182 lb 1.6 oz (82.6 kg) IBW/kg (Calculated) : 52.4   Vital Signs: Temp: 99 F (37.2 C) (10/03 1300) BP: 118/63 mmHg (10/03 1300) Pulse Rate: 112 (10/03 1300)  Labs:  Recent Labs  02/09/15 0420  02/09/15 1909 02/10/15 0400 02/10/15 1406  02/10/15 1816 02/11/15 0003 02/11/15 0350 02/11/15 1012  HGB 8.9*  < >  --  9.1*  --   < >  --  9.5* 9.3* 11.6*  HCT 26.0*  < >  --  26.7*  --   < >  --  28.0* 27.9* 34.0*  PLT 93*  --   --  54*  --   --   --   --  66*  --   APTT  --   --   --   --  74*  --  77*  --  85*  --   HEPARINUNFRC  --   --  0.38 0.38  --   --   --   --   --   --   CREATININE 1.26*  < >  --  1.26*  --   < >  --  1.20* 1.11* 1.10*  CKTOTAL 626*  --   --   --   --   --   --   --   --   --   CKMB 27.0*  --   --   --   --   --   --   --   --   --   TROPONINI 4.87*  --   --   --   --   --   --   --   --   --   < > = values in this interval not displayed.  Estimated Creatinine Clearance: 67.1 mL/min (by C-G formula based on Cr of 1.1).   Medications: Bivalirudin @ 0.1mg /kg/hr  Assessment: 44yof POD #4 CABG x 3 complicated by PEA arrest/VT/cardiogenic shock with IABP insertion continues on bivalirudin (initially on heparin but switched to bivalirudin due to drop in platelets, HIT panel pending). APTT is therapeutic at 85. IABP 1:3 and likely to be pulled today. Platelets remain low but are improving. No bleeding reported.  Goal of Therapy: APTT 50-85 seconds  Plan: 1) Continue bivalirudin @ 0.1mg /kg/hr 2) Daily APTT 3) Follow up HIT panel  Louie Casa, PharmD, BCPS 02/11/2015 2:06 PM

## 2015-02-11 NOTE — Progress Notes (Signed)
Utilization review completed.  

## 2015-02-12 ENCOUNTER — Inpatient Hospital Stay (HOSPITAL_COMMUNITY): Payer: Medicaid Other

## 2015-02-12 LAB — GLUCOSE, CAPILLARY
GLUCOSE-CAPILLARY: 103 mg/dL — AB (ref 65–99)
GLUCOSE-CAPILLARY: 109 mg/dL — AB (ref 65–99)
GLUCOSE-CAPILLARY: 123 mg/dL — AB (ref 65–99)
GLUCOSE-CAPILLARY: 130 mg/dL — AB (ref 65–99)
GLUCOSE-CAPILLARY: 133 mg/dL — AB (ref 65–99)
GLUCOSE-CAPILLARY: 165 mg/dL — AB (ref 65–99)
GLUCOSE-CAPILLARY: 167 mg/dL — AB (ref 65–99)
GLUCOSE-CAPILLARY: 174 mg/dL — AB (ref 65–99)
GLUCOSE-CAPILLARY: 181 mg/dL — AB (ref 65–99)
GLUCOSE-CAPILLARY: 192 mg/dL — AB (ref 65–99)
GLUCOSE-CAPILLARY: 95 mg/dL (ref 65–99)
Glucose-Capillary: 105 mg/dL — ABNORMAL HIGH (ref 65–99)
Glucose-Capillary: 130 mg/dL — ABNORMAL HIGH (ref 65–99)
Glucose-Capillary: 131 mg/dL — ABNORMAL HIGH (ref 65–99)
Glucose-Capillary: 92 mg/dL (ref 65–99)
Glucose-Capillary: 165 mg/dL — ABNORMAL HIGH (ref 65–99)
Glucose-Capillary: 190 mg/dL — ABNORMAL HIGH (ref 65–99)
Glucose-Capillary: 124 mg/dL — ABNORMAL HIGH (ref 65–99)
Glucose-Capillary: 73 mg/dL (ref 65–99)
Glucose-Capillary: 138 mg/dL — ABNORMAL HIGH (ref 65–99)
Glucose-Capillary: 127 mg/dL — ABNORMAL HIGH (ref 65–99)
Glucose-Capillary: 204 mg/dL — ABNORMAL HIGH (ref 65–99)

## 2015-02-12 LAB — COMPREHENSIVE METABOLIC PANEL
ALT: 127 U/L — ABNORMAL HIGH (ref 14–54)
AST: 48 U/L — ABNORMAL HIGH (ref 15–41)
Albumin: 3.1 g/dL — ABNORMAL LOW (ref 3.5–5.0)
Alkaline Phosphatase: 90 U/L (ref 38–126)
Anion gap: 7 (ref 5–15)
BUN: 14 mg/dL (ref 6–20)
CO2: 36 mmol/L — ABNORMAL HIGH (ref 22–32)
Calcium: 8.8 mg/dL — ABNORMAL LOW (ref 8.9–10.3)
Chloride: 91 mmol/L — ABNORMAL LOW (ref 101–111)
Creatinine, Ser: 1.26 mg/dL — ABNORMAL HIGH (ref 0.44–1.00)
GFR calc Af Amer: 60 mL/min — ABNORMAL LOW (ref >60)
GFR calc non Af Amer: 51 mL/min — ABNORMAL LOW (ref >60)
Glucose, Bld: 167 mg/dL — ABNORMAL HIGH (ref 65–99)
Potassium: 4.1 mmol/L (ref 3.5–5.1)
Sodium: 134 mmol/L — ABNORMAL LOW (ref 135–145)
Total Bilirubin: 1.4 mg/dL — ABNORMAL HIGH (ref 0.3–1.2)
Total Protein: 5.8 g/dL — ABNORMAL LOW (ref 6.5–8.1)

## 2015-02-12 LAB — CBC
HCT: 31.5 % — ABNORMAL LOW (ref 36.0–46.0)
Hemoglobin: 10.6 g/dL — ABNORMAL LOW (ref 12.0–15.0)
MCH: 28.9 pg (ref 26.0–34.0)
MCHC: 33.7 g/dL (ref 30.0–36.0)
MCV: 85.8 fL (ref 78.0–100.0)
Platelets: 56 10*3/uL — ABNORMAL LOW (ref 150–400)
RBC: 3.67 MIL/uL — ABNORMAL LOW (ref 3.87–5.11)
RDW: 14.4 % (ref 11.5–15.5)
WBC: 10.4 10*3/uL (ref 4.0–10.5)

## 2015-02-12 LAB — POCT I-STAT, CHEM 8
BUN: 15 mg/dL (ref 6–20)
BUN: 16 mg/dL (ref 6–20)
CHLORIDE: 83 mmol/L — AB (ref 101–111)
CHLORIDE: 87 mmol/L — AB (ref 101–111)
CREATININE: 1.1 mg/dL — AB (ref 0.44–1.00)
CREATININE: 1.3 mg/dL — AB (ref 0.44–1.00)
Calcium, Ion: 1.07 mmol/L — ABNORMAL LOW (ref 1.12–1.23)
Calcium, Ion: 1.08 mmol/L — ABNORMAL LOW (ref 1.12–1.23)
GLUCOSE: 137 mg/dL — AB (ref 65–99)
Glucose, Bld: 323 mg/dL — ABNORMAL HIGH (ref 65–99)
HCT: 31 % — ABNORMAL LOW (ref 36.0–46.0)
HEMATOCRIT: 32 % — AB (ref 36.0–46.0)
Hemoglobin: 10.5 g/dL — ABNORMAL LOW (ref 12.0–15.0)
Hemoglobin: 10.9 g/dL — ABNORMAL LOW (ref 12.0–15.0)
POTASSIUM: 3.4 mmol/L — AB (ref 3.5–5.1)
POTASSIUM: 3.6 mmol/L (ref 3.5–5.1)
Sodium: 128 mmol/L — ABNORMAL LOW (ref 135–145)
Sodium: 132 mmol/L — ABNORMAL LOW (ref 135–145)
TCO2: 28 mmol/L (ref 0–100)
TCO2: 33 mmol/L (ref 0–100)

## 2015-02-12 LAB — APTT: aPTT: 70 seconds — ABNORMAL HIGH (ref 24–37)

## 2015-02-12 LAB — POCT ACTIVATED CLOTTING TIME: ACTIVATED CLOTTING TIME: 147 s

## 2015-02-12 MED ORDER — POTASSIUM CHLORIDE 10 MEQ/50ML IV SOLN
10.0000 meq | INTRAVENOUS | Status: AC
  Administered 2015-02-12 (×3): 10 meq via INTRAVENOUS
  Filled 2015-02-12 (×3): qty 50

## 2015-02-12 MED ORDER — ALBUMIN HUMAN 5 % IV SOLN
12.5000 g | Freq: Once | INTRAVENOUS | Status: AC
  Administered 2015-02-12: 12.5 g via INTRAVENOUS
  Filled 2015-02-12: qty 250

## 2015-02-12 MED ORDER — AMIODARONE HCL 200 MG PO TABS
200.0000 mg | ORAL_TABLET | Freq: Two times a day (BID) | ORAL | Status: DC
  Administered 2015-02-12 – 2015-02-15 (×7): 200 mg via ORAL
  Filled 2015-02-12 (×8): qty 1

## 2015-02-12 NOTE — Progress Notes (Addendum)
Inpatient Diabetes Program Recommendations  AACE/ADA: New Consensus Statement on Inpatient Glycemic Control (2015)  Target Ranges:  Prepandial:   less than 140 mg/dL      Peak postprandial:   less than 180 mg/dL (1-2 hours)      Critically ill patients:  140 - 180 mg/dL   Review of Glycemic Control  Please note that with thrombocytopenia, the HgbA1C is influenced and most probably not accurate. Pt now getting transfusion again contributing to HgbA1C result. Will talk with patient and assess when when transferred to the floor.  Thank you Lenor Coffin, RN, MSN, CDE  Diabetes Inpatient Program Office: [phone] Pager: [phone] 8:00 am to 5:00 pm

## 2015-02-12 NOTE — Progress Notes (Signed)
Left femoral IABP and sheath removed without complications. Pressure held x 25 min. Site level zero. Left DP palpable.Pt teaching done. Bedrest begins at 11 am.Dressing applied.      canderson [employer] fundraiser

## 2015-02-12 NOTE — Progress Notes (Signed)
[facility] BRIEF SICU PROGRESS NOTE  R5 Days Post-Op S/P Procedure(s) (LRB): CORONARY ARTERY BYPASS GRAFTING (CABG) (N/A)  5 Days Post-Op  S/P Procedure(s) (LRB): IABP Insertion (N/A)   Stable day IABP removed uneventfully, out of bed to chair Sinus tach w/ stable BP on levophed @ 6 and milrinone @ 0.25 Diuresing very well Repeat potassium 3.6  Plan: Continue current plan.  Replace potassium  [name] Nails, MD 02/12/2015 7:11 PM

## 2015-02-12 NOTE — Progress Notes (Addendum)
      [address]       [place] [address]             [phone]      5 Days Post-Op Procedure(s) (LRB): IABP Insertion (N/A)   Subjective:  Ms. Cadmus has no complaints this morning.  She denies pain.  She states she is hoping her balloon pump comes out today.  Objective: Vital signs in last 24 hours: Temp:  [97.7 F (36.5 C)-99.1 F (37.3 C)] 98.6 F (37 C) (10/04 0843) Pulse Rate:  [35-120] 108 (10/04 0843) Cardiac Rhythm:  [-] Sinus tachycardia;Bundle branch block (10/03 2000) Resp:  [5-35] 20 (10/04 0843) BP: (81-133)/(37-94) 122/64 mmHg (10/04 0843) SpO2:  [99 %-100 %] 100 % (10/04 0843) Weight:  [167 lb 12.3 oz (76.1 kg)] 167 lb 12.3 oz (76.1 kg) (10/04 0500)  Hemodynamic parameters for last 24 hours: PAP: (15-27)/(8-16) 21/14 mmHg CO:  [3.7 L/min-6 L/min] 3.7 L/min CI:  [2 L/min/m2-3.2 L/min/m2] 2 L/min/m2  Intake/Output from previous day: 10/03 0701 - 10/04 0700 In: 2453 [I.V.:1703; IV Piggyback:750] Out: 9125 [Urine:9125]  General appearance: alert, cooperative and no distress Heart: regular rate and rhythm Lungs: clear to auscultation bilaterally Abdomen: soft, non-tender; bowel sounds normal; no masses,  no organomegaly Extremities: edema 1+ Wound: clean and dry  Lab Results:  Recent Labs  02/11/15 0350  02/11/15 1710 02/12/15 0420  WBC 14.2*  --   --  10.4  HGB 9.3*  < > 10.9* 10.6*  HCT 27.9*  < > 32.0* 31.5*  PLT 66*  --   --  56*  < > = values in this interval not displayed. BMET:  Recent Labs  02/11/15 0350  02/11/15 1710 02/12/15 0420  NA 135  < > 135 134*  K 3.4*  < > 3.7 4.1  CL 95*  < > 90* 91*  CO2 32  --   --  36*  GLUCOSE 122*  < > 144* 167*  BUN 17  < > 17 14  CREATININE 1.11*  < > 1.20* 1.26*  CALCIUM 8.1*  --   --  8.8*  < > = values in this interval not displayed.  PT/INR: No results for input(s): LABPROT, INR in the last 72 hours. ABG    Component Value Date/Time   PHART 7.473* 02/11/2015 0545    HCO3 30.8* 02/11/2015 0545   TCO2 30 02/11/2015 1710   ACIDBASEDEF 3.9* 02/10/2015 0500   O2SAT 88.4 02/11/2015 0545   CBG (last 3)   Recent Labs  02/12/15 0010 02/12/15 0337 02/12/15 0837  GLUCAP 103* 165* 167*    Assessment/Plan: S/P Procedure(s) (LRB): IABP Insertion (N/A)  1. CV- remains on balloon pump, Milrinone and Levophed- will hopefully remove balloon pump today, wean Milrinone and Levophed as tolerated 2. Pulm- off oxygen, no acute issues, continue IS 3. Renal- creatinine remains stable, weight is at baseline, no on diuretics 4. Thrombocytopenia- patient receiving transfusion today 5. DM- preop A1c is 11.7, sugars controlled, continue current regimen 6. Dispo- patient stable, possible IABP removal after platelets transfused, wean drips as tolerated, continue current care   LOS: 11 days    BARRETT, ERIN 02/12/2015  patient examined and medical record reviewed,agree with above note. Kathlee Nations Trigt III 02/13/2015

## 2015-02-12 NOTE — Progress Notes (Signed)
Subjective:   s/p CABG X 3 complicated by PEA arrest/VT/ Shock/ CPR/IABP insertion   Levophed @ 6 Milrinone @ 0.375.  IABP removed earlier today. Off lasix. Brisk diureis.    Denies SOB/Orthopnea. Pain controlled. Wants to get up and walk around.   Echo EF 45%  Objective:  Temp:  [97.7 F (36.5 C)-99.1 F (37.3 C)] 99 F (37.2 C) (10/04 1400) Pulse Rate:  [35-120] 112 (10/04 1445) Resp:  [5-35] 14 (10/04 1445) BP: (81-151)/(37-80) 125/62 mmHg (10/04 1430) SpO2:  [97 %-100 %] 100 % (10/04 1445) Weight:  [167 lb 12.3 oz (76.1 kg)] 167 lb 12.3 oz (76.1 kg) (10/04 0500) Weight change: -14 lb 5.3 oz (-6.5 kg)  Intake/Output from previous day: 10/03 0701 - 10/04 0700 In: 2453 [I.V.:1703; IV Piggyback:750] Out: 9125 [Urine:9125]  Intake/Output from this shift: Total I/O In: 473.2 [I.V.:254.2; Blood:219] Out: 2000 [Urine:2000]  Physical Exam: General appearance: awake. conversant Neck: supple Lungs: clear Heart: regular rate and rhythm, S1, S2 normal, no murmur, click, rub or gallop Extremities: extremities normal, atraumatic, no cyanosis. LUE PICC.  R and L pedal pulse 3+  Abdomen: non tender. Non distended + Bowel Sounds.   EKG: Sinus Tach 110s.   Lab Results: Results for orders placed or performed during the hospital encounter of 02/01/15 (from the past 48 hour(s))  Glucose, capillary     Status: Abnormal   Collection Time: 02/10/15  3:51 PM  Result Value Ref Range   Glucose-Capillary 110 (H) 65 - 99 mg/dL  Glucose, capillary     Status: Abnormal   Collection Time: 02/10/15  4:57 PM  Result Value Ref Range   Glucose-Capillary 103 (H) 65 - 99 mg/dL  I-STAT, chem 8     Status: Abnormal   Collection Time: 02/10/15  5:05 PM  Result Value Ref Range   Sodium 132 (L) 135 - 145 mmol/L   Potassium 3.3 (L) 3.5 - 5.1 mmol/L   Chloride 97 (L) 101 - 111 mmol/L   BUN 22 (H) 6 - 20 mg/dL   Creatinine, Ser 1.20 (H) 0.44 - 1.00 mg/dL   Glucose, Bld 113 (H) 65 - 99 mg/dL    Calcium, Ion 1.09 (L) 1.12 - 1.23 mmol/L   TCO2 25 0 - 100 mmol/L   Hemoglobin 11.2 (L) 12.0 - 15.0 g/dL   HCT 33.0 (L) 36.0 - 46.0 %  Glucose, capillary     Status: Abnormal   Collection Time: 02/10/15  5:59 PM  Result Value Ref Range   Glucose-Capillary 100 (H) 65 - 99 mg/dL  APTT     Status: Abnormal   Collection Time: 02/10/15  6:16 PM  Result Value Ref Range   aPTT 77 (H) 24 - 37 seconds    Comment:        IF BASELINE aPTT IS ELEVATED, SUGGEST PATIENT RISK ASSESSMENT BE USED TO DETERMINE APPROPRIATE ANTICOAGULANT THERAPY.   Glucose, capillary     Status: Abnormal   Collection Time: 02/10/15  6:54 PM  Result Value Ref Range   Glucose-Capillary 112 (H) 65 - 99 mg/dL  Glucose, capillary     Status: Abnormal   Collection Time: 02/10/15  8:08 PM  Result Value Ref Range   Glucose-Capillary 107 (H) 65 - 99 mg/dL   Comment 1 Venous Specimen   Glucose, capillary     Status: None   Collection Time: 02/10/15  9:06 PM  Result Value Ref Range   Glucose-Capillary 96 65 - 99 mg/dL   Comment  1 Venous Specimen   Glucose, capillary     Status: None   Collection Time: 02/10/15 10:01 PM  Result Value Ref Range   Glucose-Capillary 95 65 - 99 mg/dL  Glucose, capillary     Status: Abnormal   Collection Time: 02/10/15 11:06 PM  Result Value Ref Range   Glucose-Capillary 105 (H) 65 - 99 mg/dL   Comment 1 Venous Specimen   I-STAT, chem 8     Status: Abnormal   Collection Time: 02/11/15 12:03 AM  Result Value Ref Range   Sodium 133 (L) 135 - 145 mmol/L   Potassium 3.8 3.5 - 5.1 mmol/L   Chloride 93 (L) 101 - 111 mmol/L   BUN 20 6 - 20 mg/dL   Creatinine, Ser 1.20 (H) 0.44 - 1.00 mg/dL   Glucose, Bld 163 (H) 65 - 99 mg/dL   Calcium, Ion 1.05 (L) 1.12 - 1.23 mmol/L   TCO2 28 0 - 100 mmol/L   Hemoglobin 9.5 (L) 12.0 - 15.0 g/dL   HCT 28.0 (L) 36.0 - 46.0 %  Glucose, capillary     Status: Abnormal   Collection Time: 02/11/15  1:06 AM  Result Value Ref Range   Glucose-Capillary 115  (H) 65 - 99 mg/dL   Comment 1 Venous Specimen   Glucose, capillary     Status: Abnormal   Collection Time: 02/11/15  1:57 AM  Result Value Ref Range   Glucose-Capillary 121 (H) 65 - 99 mg/dL   Comment 1 Venous Specimen   Glucose, capillary     Status: Abnormal   Collection Time: 02/11/15  3:02 AM  Result Value Ref Range   Glucose-Capillary 111 (H) 65 - 99 mg/dL   Comment 1 Capillary Specimen   Comprehensive metabolic panel     Status: Abnormal   Collection Time: 02/11/15  3:50 AM  Result Value Ref Range   Sodium 135 135 - 145 mmol/L   Potassium 3.4 (L) 3.5 - 5.1 mmol/L   Chloride 95 (L) 101 - 111 mmol/L   CO2 32 22 - 32 mmol/L   Glucose, Bld 122 (H) 65 - 99 mg/dL   BUN 17 6 - 20 mg/dL   Creatinine, Ser 1.11 (H) 0.44 - 1.00 mg/dL   Calcium 8.1 (L) 8.9 - 10.3 mg/dL   Total Protein 5.8 (L) 6.5 - 8.1 g/dL   Albumin 3.3 (L) 3.5 - 5.0 g/dL   AST 78 (H) 15 - 41 U/L   ALT 176 (H) 14 - 54 U/L   Alkaline Phosphatase 75 38 - 126 U/L   Total Bilirubin 0.7 0.3 - 1.2 mg/dL   GFR calc non Af Amer 60 (L) >60 mL/min   GFR calc Af Amer >60 >60 mL/min    Comment: (NOTE) The eGFR has been calculated using the CKD EPI equation. This calculation has not been validated in all clinical situations. eGFR's persistently <60 mL/min signify possible Chronic Kidney Disease.    Anion gap 8 5 - 15  CBC     Status: Abnormal   Collection Time: 02/11/15  3:50 AM  Result Value Ref Range   WBC 14.2 (H) 4.0 - 10.5 K/uL   RBC 3.24 (L) 3.87 - 5.11 MIL/uL   Hemoglobin 9.3 (L) 12.0 - 15.0 g/dL   HCT 27.9 (L) 36.0 - 46.0 %   MCV 86.1 78.0 - 100.0 fL   MCH 28.7 26.0 - 34.0 pg   MCHC 33.3 30.0 - 36.0 g/dL   RDW 15.0 11.5 - 15.5 %  Platelets 66 (L) 150 - 400 K/uL    Comment: SPECIMEN CHECKED FOR CLOTS REPEATED TO VERIFY CONSISTENT WITH PREVIOUS RESULT   APTT     Status: Abnormal   Collection Time: 02/11/15  3:50 AM  Result Value Ref Range   aPTT 85 (H) 24 - 37 seconds    Comment:        IF BASELINE  aPTT IS ELEVATED, SUGGEST PATIENT RISK ASSESSMENT BE USED TO DETERMINE APPROPRIATE ANTICOAGULANT THERAPY.   Glucose, capillary     Status: Abnormal   Collection Time: 02/11/15  3:55 AM  Result Value Ref Range   Glucose-Capillary 121 (H) 65 - 99 mg/dL   Comment 1 Venous Specimen   Carboxyhemoglobin     Status: Abnormal   Collection Time: 02/11/15  4:00 AM  Result Value Ref Range   Total hemoglobin 9.4 (L) 12.0 - 16.0 g/dL   O2 Saturation 58.9 %   Carboxyhemoglobin 1.6 (H) 0.5 - 1.5 %   Methemoglobin 0.9 0.0 - 1.5 %  Glucose, capillary     Status: None   Collection Time: 02/11/15  4:59 AM  Result Value Ref Range   Glucose-Capillary 92 65 - 99 mg/dL   Comment 1 Capillary Specimen   Blood gas, arterial     Status: Abnormal   Collection Time: 02/11/15  5:45 AM  Result Value Ref Range   FIO2 0.21    pH, Arterial 7.473 (H) 7.350 - 7.450   pCO2 arterial 42.5 35.0 - 45.0 mmHg   pO2, Arterial 55.2 (L) 80.0 - 100.0 mmHg   Bicarbonate 30.8 (H) 20.0 - 24.0 mEq/L   TCO2 32.1 0 - 100 mmol/L   Acid-Base Excess 6.9 (H) 0.0 - 2.0 mmol/L   O2 Saturation 88.4 %   Patient temperature 98.6    Collection site LEFT RADIAL     Comment: CORRECTED ON 10/03 AT 0920: PREVIOUSLY REPORTED AS BRACHIAL ARTERY   Drawn by [id number]    Sample type ARTERIAL DRAW    Allens test (pass/fail) PASS PASS  Glucose, capillary     Status: Abnormal   Collection Time: 02/11/15  5:49 AM  Result Value Ref Range   Glucose-Capillary 105 (H) 65 - 99 mg/dL   Comment 1 Notify RN    Comment 2 Document in Chart   Glucose, capillary     Status: Abnormal   Collection Time: 02/11/15  6:49 AM  Result Value Ref Range   Glucose-Capillary 109 (H) 65 - 99 mg/dL  Glucose, capillary     Status: Abnormal   Collection Time: 02/11/15  7:51 AM  Result Value Ref Range   Glucose-Capillary 41 (LL) 65 - 99 mg/dL   Comment 1 Venous Specimen   Glucose, capillary     Status: Abnormal   Collection Time: 02/11/15  7:53 AM  Result Value Ref  Range   Glucose-Capillary 130 (H) 65 - 99 mg/dL  Glucose, capillary     Status: Abnormal   Collection Time: 02/11/15  9:02 AM  Result Value Ref Range   Glucose-Capillary 14 (LL) 65 - 99 mg/dL   Comment 1 Venous Specimen   Glucose, capillary     Status: Abnormal   Collection Time: 02/11/15  9:04 AM  Result Value Ref Range   Glucose-Capillary 130 (H) 65 - 99 mg/dL  Glucose, capillary     Status: Abnormal   Collection Time: 02/11/15  9:57 AM  Result Value Ref Range   Glucose-Capillary 131 (H) 65 - 99 mg/dL  I-STAT, chem 8  Status: Abnormal   Collection Time: 02/11/15 10:12 AM  Result Value Ref Range   Sodium 133 (L) 135 - 145 mmol/L   Potassium 3.7 3.5 - 5.1 mmol/L   Chloride 91 (L) 101 - 111 mmol/L   BUN 16 6 - 20 mg/dL   Creatinine, Ser 1.10 (H) 0.44 - 1.00 mg/dL   Glucose, Bld 128 (H) 65 - 99 mg/dL   Calcium, Ion 1.09 (L) 1.12 - 1.23 mmol/L   TCO2 28 0 - 100 mmol/L   Hemoglobin 11.6 (L) 12.0 - 15.0 g/dL   HCT 34.0 (L) 36.0 - 46.0 %  Glucose, capillary     Status: None   Collection Time: 02/11/15 10:56 AM  Result Value Ref Range   Glucose-Capillary 92 65 - 99 mg/dL  Glucose, capillary     Status: Abnormal   Collection Time: 02/11/15 12:05 PM  Result Value Ref Range   Glucose-Capillary 165 (H) 65 - 99 mg/dL  Glucose, capillary     Status: Abnormal   Collection Time: 02/11/15  1:07 PM  Result Value Ref Range   Glucose-Capillary 190 (H) 65 - 99 mg/dL  Glucose, capillary     Status: Abnormal   Collection Time: 02/11/15  1:57 PM  Result Value Ref Range   Glucose-Capillary 192 (H) 65 - 99 mg/dL  Glucose, capillary     Status: None   Collection Time: 02/11/15  2:56 PM  Result Value Ref Range   Glucose-Capillary 95 65 - 99 mg/dL  Glucose, capillary     Status: Abnormal   Collection Time: 02/11/15  3:58 PM  Result Value Ref Range   Glucose-Capillary 174 (H) 65 - 99 mg/dL  I-STAT, chem 8     Status: Abnormal   Collection Time: 02/11/15  5:10 PM  Result Value Ref Range    Sodium 135 135 - 145 mmol/L   Potassium 3.7 3.5 - 5.1 mmol/L   Chloride 90 (L) 101 - 111 mmol/L   BUN 17 6 - 20 mg/dL   Creatinine, Ser 1.20 (H) 0.44 - 1.00 mg/dL   Glucose, Bld 144 (H) 65 - 99 mg/dL   Calcium, Ion 1.08 (L) 1.12 - 1.23 mmol/L   TCO2 30 0 - 100 mmol/L   Hemoglobin 10.9 (L) 12.0 - 15.0 g/dL   HCT 32.0 (L) 36.0 - 46.0 %  Glucose, capillary     Status: Abnormal   Collection Time: 02/11/15  6:01 PM  Result Value Ref Range   Glucose-Capillary 124 (H) 65 - 99 mg/dL  Glucose, capillary     Status: None   Collection Time: 02/11/15  7:12 PM  Result Value Ref Range   Glucose-Capillary 73 65 - 99 mg/dL  Glucose, capillary     Status: None   Collection Time: 02/11/15  7:54 PM  Result Value Ref Range   Glucose-Capillary 96 65 - 99 mg/dL   Comment 1 Capillary Specimen   Glucose, capillary     Status: Abnormal   Collection Time: 02/11/15  9:06 PM  Result Value Ref Range   Glucose-Capillary 123 (H) 65 - 99 mg/dL   Comment 1 Capillary Specimen   APTT     Status: Abnormal   Collection Time: 02/11/15  9:20 PM  Result Value Ref Range   aPTT 73 (H) 24 - 37 seconds    Comment:        IF BASELINE aPTT IS ELEVATED, SUGGEST PATIENT RISK ASSESSMENT BE USED TO DETERMINE APPROPRIATE ANTICOAGULANT THERAPY.   Glucose, capillary  Status: Abnormal   Collection Time: 02/11/15 10:02 PM  Result Value Ref Range   Glucose-Capillary 138 (H) 65 - 99 mg/dL   Comment 1 Capillary Specimen   Glucose, capillary     Status: Abnormal   Collection Time: 02/12/15 12:10 AM  Result Value Ref Range   Glucose-Capillary 103 (H) 65 - 99 mg/dL   Comment 1 Notify RN    Comment 2 Document in Chart   Glucose, capillary     Status: Abnormal   Collection Time: 02/12/15  3:37 AM  Result Value Ref Range   Glucose-Capillary 165 (H) 65 - 99 mg/dL   Comment 1 Notify RN    Comment 2 Document in Chart   Comprehensive metabolic panel     Status: Abnormal   Collection Time: 02/12/15  4:20 AM  Result Value Ref  Range   Sodium 134 (L) 135 - 145 mmol/L   Potassium 4.1 3.5 - 5.1 mmol/L   Chloride 91 (L) 101 - 111 mmol/L   CO2 36 (H) 22 - 32 mmol/L   Glucose, Bld 167 (H) 65 - 99 mg/dL   BUN 14 6 - 20 mg/dL   Creatinine, Ser 1.26 (H) 0.44 - 1.00 mg/dL   Calcium 8.8 (L) 8.9 - 10.3 mg/dL   Total Protein 5.8 (L) 6.5 - 8.1 g/dL   Albumin 3.1 (L) 3.5 - 5.0 g/dL   AST 48 (H) 15 - 41 U/L   ALT 127 (H) 14 - 54 U/L   Alkaline Phosphatase 90 38 - 126 U/L   Total Bilirubin 1.4 (H) 0.3 - 1.2 mg/dL   GFR calc non Af Amer 51 (L) >60 mL/min   GFR calc Af Amer 60 (L) >60 mL/min    Comment: (NOTE) The eGFR has been calculated using the CKD EPI equation. This calculation has not been validated in all clinical situations. eGFR's persistently <60 mL/min signify possible Chronic Kidney Disease.    Anion gap 7 5 - 15  CBC     Status: Abnormal   Collection Time: 02/12/15  4:20 AM  Result Value Ref Range   WBC 10.4 4.0 - 10.5 K/uL    Comment: REPEATED TO VERIFY   RBC 3.67 (L) 3.87 - 5.11 MIL/uL   Hemoglobin 10.6 (L) 12.0 - 15.0 g/dL    Comment: REPEATED TO VERIFY CONSISTENT WITH PREVIOUS RESULT    HCT 31.5 (L) 36.0 - 46.0 %   MCV 85.8 78.0 - 100.0 fL   MCH 28.9 26.0 - 34.0 pg   MCHC 33.7 30.0 - 36.0 g/dL   RDW 14.4 11.5 - 15.5 %   Platelets 56 (L) 150 - 400 K/uL    Comment: SPECIMEN CHECKED FOR CLOTS REPEATED TO VERIFY CONSISTENT WITH PREVIOUS RESULT   APTT     Status: Abnormal   Collection Time: 02/12/15  4:20 AM  Result Value Ref Range   aPTT 70 (H) 24 - 37 seconds    Comment:        IF BASELINE aPTT IS ELEVATED, SUGGEST PATIENT RISK ASSESSMENT BE USED TO DETERMINE APPROPRIATE ANTICOAGULANT THERAPY.   Prepare Pheresed Platelets     Status: None (Preliminary result)   Collection Time: 02/12/15  7:55 AM  Result Value Ref Range   Unit Number [id number]    Blood Component Type PLTPHER LR2    Unit division 00    Status of Unit ISSUED    Transfusion Status OK TO TRANSFUSE   Glucose,  capillary     Status: Abnormal   Collection  Time: 02/12/15  8:37 AM  Result Value Ref Range   Glucose-Capillary 167 (H) 65 - 99 mg/dL   Comment 1 Notify RN   POCT Activated clotting time     Status: None   Collection Time: 02/12/15 10:01 AM  Result Value Ref Range   Activated Clotting Time 147 seconds  Glucose, capillary     Status: Abnormal   Collection Time: 02/12/15 11:51 AM  Result Value Ref Range   Glucose-Capillary 133 (H) 65 - 99 mg/dL   Comment 1 Notify RN     Imaging: Imaging results have been reviewed  Assessment/Plan:   1. Cardiogenic shock 2. Cardiac arrest 3. CAD with NSTEMI    -s/p CABG 9/29 4. Diabetes 5. Ventricular tachycardia  6. 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 Acute blood loss anemia   IAPB removed earlier. Off bival. Add SCDs for DVT prophylaxis. Remains milrinone 0.3 +  norepi 6 mcg. Start to wean norepi. Volume status stable. Lasix drip stopped earlier today. Continues to auto diurese. Renal function stable.   10/3 ECHO  EF 45-50% Grade I DD RV normal   WBC 10.4.    CLEGG,AMY NP-C  02/12/2015, 3:10 PM   Patient seen and examined with Darrick Grinder, NP. We discussed all aspects of the encounter. I agree with the assessment and plan as stated above.   Doing well. Weight down to baseline. IABP out. Wean norepi as tolerated. Cut milrinone to 0.2. Check co-ox in am. Ambulate. Agree with holding diuretics for now. Decrease amio to 200 bid.   Hermenegildo Clausen,MD 5:18 PM

## 2015-02-12 NOTE — Plan of Care (Signed)
Problem: Phase II - Intermediate Post-Op Goal: Maintain Hemodynamic Stability Outcome: Progressing Weaning drips, weaning IABP

## 2015-02-13 ENCOUNTER — Inpatient Hospital Stay (HOSPITAL_COMMUNITY): Payer: Medicaid Other

## 2015-02-13 DIAGNOSIS — I472 Ventricular tachycardia: Secondary | ICD-10-CM

## 2015-02-13 LAB — GLUCOSE, CAPILLARY
GLUCOSE-CAPILLARY: 158 mg/dL — AB (ref 65–99)
GLUCOSE-CAPILLARY: 183 mg/dL — AB (ref 65–99)
GLUCOSE-CAPILLARY: 219 mg/dL — AB (ref 65–99)
Glucose-Capillary: 124 mg/dL — ABNORMAL HIGH (ref 65–99)
Glucose-Capillary: 183 mg/dL — ABNORMAL HIGH (ref 65–99)

## 2015-02-13 LAB — PREPARE PLATELET PHERESIS: Unit division: 0

## 2015-02-13 LAB — CBC
HCT: 29.9 % — ABNORMAL LOW (ref 36.0–46.0)
Hemoglobin: 10 g/dL — ABNORMAL LOW (ref 12.0–15.0)
MCH: 28.4 pg (ref 26.0–34.0)
MCHC: 33.4 g/dL (ref 30.0–36.0)
MCV: 84.9 fL (ref 78.0–100.0)
Platelets: 127 10*3/uL — ABNORMAL LOW (ref 150–400)
RBC: 3.52 MIL/uL — ABNORMAL LOW (ref 3.87–5.11)
RDW: 13.9 % (ref 11.5–15.5)
WBC: 10.3 10*3/uL (ref 4.0–10.5)

## 2015-02-13 LAB — CARBOXYHEMOGLOBIN
Carboxyhemoglobin: 1.7 % — ABNORMAL HIGH (ref 0.5–1.5)
Carboxyhemoglobin: 1.8 % — ABNORMAL HIGH (ref 0.5–1.5)
Methemoglobin: 0.7 % (ref 0.0–1.5)
Methemoglobin: 0.9 % (ref 0.0–1.5)
O2 Saturation: 65.6 %
O2 Saturation: 59 %
TOTAL HEMOGLOBIN: 8.5 g/dL — AB (ref 12.0–16.0)
Total hemoglobin: 10.6 g/dL — ABNORMAL LOW (ref 12.0–16.0)

## 2015-02-13 LAB — BASIC METABOLIC PANEL
Anion gap: 7 (ref 5–15)
BUN: 13 mg/dL (ref 6–20)
CO2: 33 mmol/L — ABNORMAL HIGH (ref 22–32)
Calcium: 8.8 mg/dL — ABNORMAL LOW (ref 8.9–10.3)
Chloride: 92 mmol/L — ABNORMAL LOW (ref 101–111)
Creatinine, Ser: 1.06 mg/dL — ABNORMAL HIGH (ref 0.44–1.00)
GFR calc Af Amer: >60 mL/min (ref >60)
GFR calc non Af Amer: >60 mL/min (ref >60)
Glucose, Bld: 156 mg/dL — ABNORMAL HIGH (ref 65–99)
Potassium: 3.8 mmol/L (ref 3.5–5.1)
Sodium: 132 mmol/L — ABNORMAL LOW (ref 135–145)

## 2015-02-13 MED ORDER — MILRINONE IN DEXTROSE 20 MG/100ML IV SOLN: 0.2500 ug/kg/min | INTRAVENOUS | Status: DC

## 2015-02-13 MED ORDER — POTASSIUM CHLORIDE 10 MEQ/50ML IV SOLN
10.0000 meq | INTRAVENOUS | Status: AC
  Administered 2015-02-13 (×2): 10 meq via INTRAVENOUS
  Filled 2015-02-13 (×2): qty 50

## 2015-02-13 MED ORDER — APIXABAN 2.5 MG PO TABS
2.5000 mg | ORAL_TABLET | Freq: Two times a day (BID) | ORAL | Status: DC
  Administered 2015-02-13 – 2015-02-16 (×7): 2.5 mg via ORAL
  Filled 2015-02-13 (×8): qty 1

## 2015-02-13 MED ORDER — ASPIRIN EC 81 MG PO TBEC
81.0000 mg | DELAYED_RELEASE_TABLET | Freq: Every day | ORAL | Status: DC
  Administered 2015-02-13 – 2015-02-16 (×4): 81 mg via ORAL
  Filled 2015-02-13 (×4): qty 1

## 2015-02-13 MED ORDER — ENOXAPARIN SODIUM 40 MG/0.4ML ~~LOC~~ SOLN: 40.0000 mg | SUBCUTANEOUS | Status: DC

## 2015-02-13 NOTE — Progress Notes (Signed)
CT surgery p.m. Rounds  Patient progressing, ambulated 600 feet Norepinephrine weaned off today Hope to transfer to stepdown tomorrow Continue current care Add midodrinre if needed for orthostatic hypotension/dizziness

## 2015-02-13 NOTE — Progress Notes (Signed)
Pt educated several times throughout the shift and yesterday about sternal precautions and the importance of them especially after having CPR preformed on her chest earlier this week. Pt receives education willingly but is non-compliant with protecting her sternum. Pt continues to use arms to push/pull herself around the bed despite multiple education sessions by various RNs.

## 2015-02-13 NOTE — Progress Notes (Signed)
Subjective:   s/p CABG X 3 complicated by PEA arrest/VT/ Shock/ CPR/IABP insertion. IABP  02/12/15   Remains on levo 2 mcg + 0.25 mcg milrinone.   Denies SOB/Orthopnea. Pain controlled.   CO-OX today 66%  02/11/15 Echo EF 45%  Objective:  Temp:  [97.6 F (36.4 C)-99.1 F (37.3 C)] 97.6 F (36.4 C) (10/05 0806) Pulse Rate:  [36-125] 117 (10/05 0700) Resp:  [0-23] 20 (10/05 0800) BP: (87-151)/(38-91) 93/62 mmHg (10/05 0800) SpO2:  [97 %-100 %] 98 % (10/05 0700) Weight:  [165 lb 12.6 oz (75.2 kg)] 165 lb 12.6 oz (75.2 kg) (10/05 0600) Weight change: -1 lb 15.8 oz (-0.9 kg)  Intake/Output from previous day: 10/04 0701 - 10/05 0700 In: 2348.9 [P.O.:840; I.V.:789.9; Blood:219; IV JOACZYSAY:301] Out: 6010 [Urine:3640]  Intake/Output from this shift: Total I/O In: 196 [I.V.:196] Out: 65 [Urine:65]  Physical Exam: General appearance: awake. conversant Neck: supple JVP flat Lungs: clear Heart: regular rate and rhythm, S1, S2 normal, no murmur, click, rub or gallop Extremities: extremities normal, atraumatic, no cyanosis. LUE PICC.  R and L SCDs.  Abdomen: non tender. Non distended + Bowel Sounds.   EKG: Sinus Tach 110s.   Lab Results: Results for orders placed or performed during the hospital encounter of 02/01/15 (from the past 48 hour(s))  Glucose, capillary     Status: Abnormal   Collection Time: 02/11/15  9:57 AM  Result Value Ref Range   Glucose-Capillary 131 (H) 65 - 99 mg/dL  I-STAT, chem 8     Status: Abnormal   Collection Time: 02/11/15 10:12 AM  Result Value Ref Range   Sodium 133 (L) 135 - 145 mmol/L   Potassium 3.7 3.5 - 5.1 mmol/L   Chloride 91 (L) 101 - 111 mmol/L   BUN 16 6 - 20 mg/dL   Creatinine, Ser 1.10 (H) 0.44 - 1.00 mg/dL   Glucose, Bld 128 (H) 65 - 99 mg/dL   Calcium, Ion 1.09 (L) 1.12 - 1.23 mmol/L   TCO2 28 0 - 100 mmol/L   Hemoglobin 11.6 (L) 12.0 - 15.0 g/dL   HCT 34.0 (L) 36.0 - 46.0 %  Glucose, capillary     Status: None   Collection Time: 02/11/15 10:56 AM  Result Value Ref Range   Glucose-Capillary 92 65 - 99 mg/dL  Glucose, capillary     Status: Abnormal   Collection Time: 02/11/15 12:05 PM  Result Value Ref Range   Glucose-Capillary 165 (H) 65 - 99 mg/dL  Glucose, capillary     Status: Abnormal   Collection Time: 02/11/15  1:07 PM  Result Value Ref Range   Glucose-Capillary 190 (H) 65 - 99 mg/dL  Glucose, capillary     Status: Abnormal   Collection Time: 02/11/15  1:57 PM  Result Value Ref Range   Glucose-Capillary 192 (H) 65 - 99 mg/dL  Glucose, capillary     Status: None   Collection Time: 02/11/15  2:56 PM  Result Value Ref Range   Glucose-Capillary 95 65 - 99 mg/dL  Glucose, capillary     Status: Abnormal   Collection Time: 02/11/15  3:58 PM  Result Value Ref Range   Glucose-Capillary 174 (H) 65 - 99 mg/dL  I-STAT, chem 8     Status: Abnormal   Collection Time: 02/11/15  5:10 PM  Result Value Ref Range   Sodium 135 135 - 145 mmol/L   Potassium 3.7 3.5 - 5.1 mmol/L   Chloride 90 (L) 101 - 111 mmol/L  BUN 17 6 - 20 mg/dL   Creatinine, Ser 1.20 (H) 0.44 - 1.00 mg/dL   Glucose, Bld 144 (H) 65 - 99 mg/dL   Calcium, Ion 1.08 (L) 1.12 - 1.23 mmol/L   TCO2 30 0 - 100 mmol/L   Hemoglobin 10.9 (L) 12.0 - 15.0 g/dL   HCT 32.0 (L) 36.0 - 46.0 %  Glucose, capillary     Status: Abnormal   Collection Time: 02/11/15  6:01 PM  Result Value Ref Range   Glucose-Capillary 124 (H) 65 - 99 mg/dL  Glucose, capillary     Status: None   Collection Time: 02/11/15  7:12 PM  Result Value Ref Range   Glucose-Capillary 73 65 - 99 mg/dL  Glucose, capillary     Status: None   Collection Time: 02/11/15  7:54 PM  Result Value Ref Range   Glucose-Capillary 96 65 - 99 mg/dL   Comment 1 Capillary Specimen   Glucose, capillary     Status: Abnormal   Collection Time: 02/11/15  9:06 PM  Result Value Ref Range   Glucose-Capillary 123 (H) 65 - 99 mg/dL   Comment 1 Capillary Specimen   APTT     Status: Abnormal    Collection Time: 02/11/15  9:20 PM  Result Value Ref Range   aPTT 73 (H) 24 - 37 seconds    Comment:        IF BASELINE aPTT IS ELEVATED, SUGGEST PATIENT RISK ASSESSMENT BE USED TO DETERMINE APPROPRIATE ANTICOAGULANT THERAPY.   Glucose, capillary     Status: Abnormal   Collection Time: 02/11/15 10:02 PM  Result Value Ref Range   Glucose-Capillary 138 (H) 65 - 99 mg/dL   Comment 1 Capillary Specimen   Glucose, capillary     Status: Abnormal   Collection Time: 02/12/15 12:10 AM  Result Value Ref Range   Glucose-Capillary 103 (H) 65 - 99 mg/dL   Comment 1 Notify RN    Comment 2 Document in Chart   Glucose, capillary     Status: Abnormal   Collection Time: 02/12/15  3:37 AM  Result Value Ref Range   Glucose-Capillary 165 (H) 65 - 99 mg/dL   Comment 1 Notify RN    Comment 2 Document in Chart   Comprehensive metabolic panel     Status: Abnormal   Collection Time: 02/12/15  4:20 AM  Result Value Ref Range   Sodium 134 (L) 135 - 145 mmol/L   Potassium 4.1 3.5 - 5.1 mmol/L   Chloride 91 (L) 101 - 111 mmol/L   CO2 36 (H) 22 - 32 mmol/L   Glucose, Bld 167 (H) 65 - 99 mg/dL   BUN 14 6 - 20 mg/dL   Creatinine, Ser 1.26 (H) 0.44 - 1.00 mg/dL   Calcium 8.8 (L) 8.9 - 10.3 mg/dL   Total Protein 5.8 (L) 6.5 - 8.1 g/dL   Albumin 3.1 (L) 3.5 - 5.0 g/dL   AST 48 (H) 15 - 41 U/L   ALT 127 (H) 14 - 54 U/L   Alkaline Phosphatase 90 38 - 126 U/L   Total Bilirubin 1.4 (H) 0.3 - 1.2 mg/dL   GFR calc non Af Amer 51 (L) >60 mL/min   GFR calc Af Amer 60 (L) >60 mL/min    Comment: (NOTE) The eGFR has been calculated using the CKD EPI equation. This calculation has not been validated in all clinical situations. eGFR's persistently <60 mL/min signify possible Chronic Kidney Disease.    Anion gap 7 5 - 15  CBC     Status: Abnormal   Collection Time: 02/12/15  4:20 AM  Result Value Ref Range   WBC 10.4 4.0 - 10.5 K/uL    Comment: REPEATED TO VERIFY   RBC 3.67 (L) 3.87 - 5.11 MIL/uL    Hemoglobin 10.6 (L) 12.0 - 15.0 g/dL    Comment: REPEATED TO VERIFY CONSISTENT WITH PREVIOUS RESULT    HCT 31.5 (L) 36.0 - 46.0 %   MCV 85.8 78.0 - 100.0 fL   MCH 28.9 26.0 - 34.0 pg   MCHC 33.7 30.0 - 36.0 g/dL   RDW 14.4 11.5 - 15.5 %   Platelets 56 (L) 150 - 400 K/uL    Comment: SPECIMEN CHECKED FOR CLOTS REPEATED TO VERIFY CONSISTENT WITH PREVIOUS RESULT   APTT     Status: Abnormal   Collection Time: 02/12/15  4:20 AM  Result Value Ref Range   aPTT 70 (H) 24 - 37 seconds    Comment:        IF BASELINE aPTT IS ELEVATED, SUGGEST PATIENT RISK ASSESSMENT BE USED TO DETERMINE APPROPRIATE ANTICOAGULANT THERAPY.   Prepare Pheresed Platelets     Status: None (Preliminary result)   Collection Time: 02/12/15  7:55 AM  Result Value Ref Range   Unit Number [id number]    Blood Component Type PLTPHER LR2    Unit division 00    Status of Unit ISSUED    Transfusion Status OK TO TRANSFUSE   Glucose, capillary     Status: Abnormal   Collection Time: 02/12/15  8:37 AM  Result Value Ref Range   Glucose-Capillary 167 (H) 65 - 99 mg/dL   Comment 1 Notify RN   POCT Activated clotting time     Status: None   Collection Time: 02/12/15 10:01 AM  Result Value Ref Range   Activated Clotting Time 147 seconds  Glucose, capillary     Status: Abnormal   Collection Time: 02/12/15 11:51 AM  Result Value Ref Range   Glucose-Capillary 133 (H) 65 - 99 mg/dL   Comment 1 Notify RN   Glucose, capillary     Status: Abnormal   Collection Time: 02/12/15  4:17 PM  Result Value Ref Range   Glucose-Capillary 127 (H) 65 - 99 mg/dL   Comment 1 Notify RN   I-STAT, chem 8     Status: Abnormal   Collection Time: 02/12/15  4:51 PM  Result Value Ref Range   Sodium 128 (L) 135 - 145 mmol/L   Potassium 3.4 (L) 3.5 - 5.1 mmol/L   Chloride 83 (L) 101 - 111 mmol/L   BUN 15 6 - 20 mg/dL   Creatinine, Ser 1.10 (H) 0.44 - 1.00 mg/dL   Glucose, Bld 323 (H) 65 - 99 mg/dL   Calcium, Ion 1.07 (L) 1.12 - 1.23  mmol/L   TCO2 28 0 - 100 mmol/L   Hemoglobin 10.9 (L) 12.0 - 15.0 g/dL   HCT 32.0 (L) 36.0 - 46.0 %  I-STAT, chem 8     Status: Abnormal   Collection Time: 02/12/15  5:03 PM  Result Value Ref Range   Sodium 132 (L) 135 - 145 mmol/L   Potassium 3.6 3.5 - 5.1 mmol/L   Chloride 87 (L) 101 - 111 mmol/L   BUN 16 6 - 20 mg/dL   Creatinine, Ser 1.30 (H) 0.44 - 1.00 mg/dL   Glucose, Bld 137 (H) 65 - 99 mg/dL   Calcium, Ion 1.08 (L) 1.12 - 1.23 mmol/L   TCO2 33 0 -  100 mmol/L   Hemoglobin 10.5 (L) 12.0 - 15.0 g/dL   HCT 31.0 (L) 36.0 - 46.0 %  Glucose, capillary     Status: Abnormal   Collection Time: 02/12/15  7:33 PM  Result Value Ref Range   Glucose-Capillary 204 (H) 65 - 99 mg/dL   Comment 1 Capillary Specimen    Comment 2 Notify RN    Comment 3 Document in Chart   Glucose, capillary     Status: Abnormal   Collection Time: 02/12/15 11:43 PM  Result Value Ref Range   Glucose-Capillary 181 (H) 65 - 99 mg/dL   Comment 1 Capillary Specimen    Comment 2 Notify RN    Comment 3 Document in Chart   Glucose, capillary     Status: Abnormal   Collection Time: 02/13/15  3:57 AM  Result Value Ref Range   Glucose-Capillary 158 (H) 65 - 99 mg/dL   Comment 1 Capillary Specimen    Comment 2 Notify RN    Comment 3 Document in Chart   CBC     Status: Abnormal   Collection Time: 02/13/15  4:00 AM  Result Value Ref Range   WBC 10.3 4.0 - 10.5 K/uL   RBC 3.52 (L) 3.87 - 5.11 MIL/uL   Hemoglobin 10.0 (L) 12.0 - 15.0 g/dL   HCT 29.9 (L) 36.0 - 46.0 %   MCV 84.9 78.0 - 100.0 fL   MCH 28.4 26.0 - 34.0 pg   MCHC 33.4 30.0 - 36.0 g/dL   RDW 13.9 11.5 - 15.5 %   Platelets 127 (L) 150 - 400 K/uL    Comment: REPEATED TO VERIFY DELTA CHECK NOTED   Basic metabolic panel     Status: Abnormal   Collection Time: 02/13/15  4:00 AM  Result Value Ref Range   Sodium 132 (L) 135 - 145 mmol/L   Potassium 3.8 3.5 - 5.1 mmol/L   Chloride 92 (L) 101 - 111 mmol/L   CO2 33 (H) 22 - 32 mmol/L   Glucose, Bld  156 (H) 65 - 99 mg/dL   BUN 13 6 - 20 mg/dL   Creatinine, Ser 1.06 (H) 0.44 - 1.00 mg/dL   Calcium 8.8 (L) 8.9 - 10.3 mg/dL   GFR calc non Af Amer >60 >60 mL/min   GFR calc Af Amer >60 >60 mL/min    Comment: (NOTE) The eGFR has been calculated using the CKD EPI equation. This calculation has not been validated in all clinical situations. eGFR's persistently <60 mL/min signify possible Chronic Kidney Disease.    Anion gap 7 5 - 15  Carboxyhemoglobin     Status: Abnormal   Collection Time: 02/13/15  4:21 AM  Result Value Ref Range   Total hemoglobin 8.5 (L) 12.0 - 16.0 g/dL   O2 Saturation 65.6 %   Carboxyhemoglobin 1.7 (H) 0.5 - 1.5 %   Methemoglobin 0.7 0.0 - 1.5 %  Glucose, capillary     Status: Abnormal   Collection Time: 02/13/15  7:35 AM  Result Value Ref Range   Glucose-Capillary 124 (H) 65 - 99 mg/dL   Comment 1 Capillary Specimen    Comment 2 Notify RN     Imaging: Imaging results have been reviewed  Assessment/Plan:   1. Cardiogenic shock 2. Cardiac arrest 3. CAD with NSTEMI    -s/p CABG 9/29 4. Diabetes 5. Ventricular tachycardia  6. 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 Acute blood loss anemia    Remains on norepi 2 mcg. CO-OX today 66%.  Cut back milrinone 0.125 mcg. Check CO-OX noon if ok can stop. May need to add midodrine if SBp remains soft. No diuretics.   SRA pending. Continue SCDs for DVT prophylaxis. Add eliquis 2.5 mg twice a day.   Remove foley.    10/3 ECHO  EF 45-50% Grade I DD RV normal   WBC 10.3   CLEGG,AMY NP-C  02/13/2015, 9:08 AM    Patient seen and examined with Darrick Grinder, NP. We discussed all aspects of the encounter. I agree with the assessment and plan as stated above.   Much improved today. Volume status looks good. Still mildly hypotensive. Will wean off milrinone today. If unable to get norepi off can add low dose midodrine to support. Remove Foley. Ambulate. Await SRA panel to assess for HIT. Will use low-dose Eliquis for DVT  prophylaxis. Continue amio for VT.   Bensimhon, Daniel,MD 9:58 AM

## 2015-02-13 NOTE — Progress Notes (Signed)
6 Days Post-Op Procedure(s) (LRB): IABP Insertion (N/A) Subjective: Doing well OOB to chair- weak C/o dizzyness cxr clear nsr  Objective: Vital signs in last 24 hours: Temp:  [97.6 F (36.4 C)-99.1 F (37.3 C)] 97.6 F (36.4 C) (10/05 0806) Pulse Rate:  [36-125] 117 (10/05 0700) Cardiac Rhythm:  [-] Sinus tachycardia (10/05 0700) Resp:  [0-23] 19 (10/05 0700) BP: (87-151)/(38-91) 101/66 mmHg (10/05 0700) SpO2:  [97 %-100 %] 98 % (10/05 0700) Weight:  [165 lb 12.6 oz (75.2 kg)] 165 lb 12.6 oz (75.2 kg) (10/05 0600)  Hemodynamic parameters for last 24 hours: PAP: (26-32)/(18-23) 26/18 mmHg  Intake/Output from previous day: 10/04 0701 - 10/05 0700 In: 2348.9 [P.O.:840; I.V.:789.9; Blood:219; IV Piggyback:500] Out: 3640 [Urine:3640] Intake/Output this shift:    Neuro intact No edema Incisions clean  Lab Results:  Recent Labs  02/12/15 0420  02/12/15 1703 02/13/15 0400  WBC 10.4  --   --  10.3  HGB 10.6*  < > 10.5* 10.0*  HCT 31.5*  < > 31.0* 29.9*  PLT 56*  --   --  127*  < > = values in this interval not displayed. BMET:  Recent Labs  02/12/15 0420  02/12/15 1703 02/13/15 0400  NA 134*  < > 132* 132*  K 4.1  < > 3.6 3.8  CL 91*  < > 87* 92*  CO2 36*  --   --  33*  GLUCOSE 167*  < > 137* 156*  BUN 14  < > 16 13  CREATININE 1.26*  < > 1.30* 1.06*  CALCIUM 8.8*  --   --  8.8*  < > = values in this interval not displayed.  PT/INR: No results for input(s): LABPROT, INR in the last 72 hours. ABG    Component Value Date/Time   PHART 7.473* 02/11/2015 0545   HCO3 30.8* 02/11/2015 0545   TCO2 33 02/12/2015 1703   ACIDBASEDEF 3.9* 02/10/2015 0500   O2SAT 65.6 02/13/2015 0421   CBG (last 3)   Recent Labs  02/12/15 2343 02/13/15 0357 02/13/15 0735  GLUCAP 181* 158* 124*    Assessment/Plan: S/P Procedure(s) (LRB): IABP Insertion (N/A) Mobilize- PT Wean norepi   LOS: 12 days    [name] 02/13/2015

## 2015-02-13 NOTE — Plan of Care (Signed)
Problem: Phase II - Intermediate Post-Op Goal: Activity Progressed Outcome: Progressing Ambulating in hallway

## 2015-02-14 LAB — GLUCOSE, CAPILLARY
GLUCOSE-CAPILLARY: 114 mg/dL — AB (ref 65–99)
GLUCOSE-CAPILLARY: 123 mg/dL — AB (ref 65–99)
GLUCOSE-CAPILLARY: 14 mg/dL — AB (ref 65–99)
GLUCOSE-CAPILLARY: 245 mg/dL — AB (ref 65–99)
GLUCOSE-CAPILLARY: 41 mg/dL — AB (ref 65–99)
Glucose-Capillary: 85 mg/dL (ref 65–99)
Glucose-Capillary: 117 mg/dL — ABNORMAL HIGH (ref 65–99)
Glucose-Capillary: 241 mg/dL — ABNORMAL HIGH (ref 65–99)

## 2015-02-14 LAB — CARBOXYHEMOGLOBIN
Carboxyhemoglobin: 1.8 % — ABNORMAL HIGH (ref 0.5–1.5)
Methemoglobin: 0.8 % (ref 0.0–1.5)
O2 Saturation: 60.9 %
Total hemoglobin: 9.9 g/dL — ABNORMAL LOW (ref 12.0–16.0)

## 2015-02-14 LAB — SEROTONIN RELEASE ASSAY (SRA)
SRA .2 IU/mL UFH Ser-aCnc: 66 % — ABNORMAL HIGH (ref 0–20)
SRA 100IU/mL UFH Ser-aCnc: 1 % (ref 0–20)

## 2015-02-14 LAB — BASIC METABOLIC PANEL
Anion gap: 11 (ref 5–15)
BUN: 14 mg/dL (ref 6–20)
CO2: 30 mmol/L (ref 22–32)
Calcium: 8.9 mg/dL (ref 8.9–10.3)
Chloride: 93 mmol/L — ABNORMAL LOW (ref 101–111)
Creatinine, Ser: 0.96 mg/dL (ref 0.44–1.00)
GFR calc Af Amer: >60 mL/min (ref >60)
GFR calc non Af Amer: >60 mL/min (ref >60)
Glucose, Bld: 100 mg/dL — ABNORMAL HIGH (ref 65–99)
Potassium: 3.2 mmol/L — ABNORMAL LOW (ref 3.5–5.1)
Sodium: 134 mmol/L — ABNORMAL LOW (ref 135–145)

## 2015-02-14 LAB — CBC
HCT: 29.3 % — ABNORMAL LOW (ref 36.0–46.0)
Hemoglobin: 10.1 g/dL — ABNORMAL LOW (ref 12.0–15.0)
MCH: 29.3 pg (ref 26.0–34.0)
MCHC: 34.5 g/dL (ref 30.0–36.0)
MCV: 84.9 fL (ref 78.0–100.0)
Platelets: 150 10*3/uL (ref 150–400)
RBC: 3.45 MIL/uL — ABNORMAL LOW (ref 3.87–5.11)
RDW: 14.1 % (ref 11.5–15.5)
WBC: 9.6 10*3/uL (ref 4.0–10.5)

## 2015-02-14 MED ORDER — MAGNESIUM HYDROXIDE 400 MG/5ML PO SUSP: 30.0000 mL | Freq: Every day | ORAL | Status: DC | PRN

## 2015-02-14 MED ORDER — SORBITOL 70 % PO SOLN
30.0000 mL | Freq: Once | ORAL | Status: AC
  Administered 2015-02-14: 30 mL via ORAL
  Filled 2015-02-14: qty 30

## 2015-02-14 MED ORDER — MOVING RIGHT ALONG BOOK
Freq: Once | Status: AC
  Administered 2015-02-15: 10:00:00
  Filled 2015-02-14: qty 1

## 2015-02-14 MED ORDER — SODIUM CHLORIDE 0.9 % IV SOLN: 250.0000 mL | INTRAVENOUS | Status: DC | PRN

## 2015-02-14 MED ORDER — POLYVINYL ALCOHOL 1.4 % OP SOLN
2.0000 [drp] | Freq: Three times a day (TID) | OPHTHALMIC | Status: DC | PRN
  Administered 2015-02-14 – 2015-02-15 (×2): 2 [drp] via OPHTHALMIC
  Filled 2015-02-14: qty 15

## 2015-02-14 MED ORDER — POTASSIUM CHLORIDE CRYS ER 20 MEQ PO TBCR
20.0000 meq | EXTENDED_RELEASE_TABLET | Freq: Every day | ORAL | Status: DC
  Administered 2015-02-15 – 2015-02-16 (×2): 20 meq via ORAL
  Filled 2015-02-14 (×2): qty 1

## 2015-02-14 MED ORDER — SODIUM CHLORIDE 0.9 % IJ SOLN
3.0000 mL | Freq: Two times a day (BID) | INTRAMUSCULAR | Status: DC
  Administered 2015-02-14: 3 mL via INTRAVENOUS

## 2015-02-14 MED ORDER — HYPROMELLOSE (GONIOSCOPIC) 2.5 % OP SOLN
2.0000 [drp] | Freq: Three times a day (TID) | OPHTHALMIC | Status: DC | PRN
  Filled 2015-02-14: qty 15

## 2015-02-14 MED ORDER — INSULIN ASPART 100 UNIT/ML ~~LOC~~ SOLN: 0.0000 [IU] | Freq: Three times a day (TID) | SUBCUTANEOUS | Status: DC

## 2015-02-14 MED ORDER — POTASSIUM CHLORIDE 10 MEQ/50ML IV SOLN
10.0000 meq | INTRAVENOUS | Status: AC | PRN
  Administered 2015-02-14 (×3): 10 meq via INTRAVENOUS
  Filled 2015-02-14 (×3): qty 50

## 2015-02-14 MED ORDER — SODIUM CHLORIDE 0.9 % IJ SOLN: 3.0000 mL | INTRAMUSCULAR | Status: DC | PRN

## 2015-02-14 MED ORDER — INSULIN ASPART 100 UNIT/ML ~~LOC~~ SOLN
0.0000 [IU] | Freq: Three times a day (TID) | SUBCUTANEOUS | Status: DC
Start: 2015-02-14 — End: 2015-02-16
  Administered 2015-02-14 (×2): 8 [IU] via SUBCUTANEOUS
  Administered 2015-02-15 (×2): 2 [IU] via SUBCUTANEOUS
  Administered 2015-02-15 (×2): 4 [IU] via SUBCUTANEOUS

## 2015-02-14 NOTE — Progress Notes (Addendum)
[facility] DAILY ICU PROGRESS NOTE                   [address]            [place] [address]          [phone]   7 Days Post-Op Procedure(s) (LRB): IABP Insertion (N/A)  Total Length of Stay:  LOS: 13 days   Subjective:  [name] states her eyes are burning.  She otherwise has no complaints.  + ambulation  Objective: Vital signs in last 24 hours: Temp:  [97.6 F (36.4 C)-98.7 F (37.1 C)] 98.7 F (37.1 C) (10/06 0400) Pulse Rate:  [97-121] 105 (10/06 0600) Cardiac Rhythm:  [-] Sinus tachycardia (10/06 0700) Resp:  [0-26] 20 (10/06 0700) BP: (89-140)/(55-85) 93/68 mmHg (10/06 0700) SpO2:  [95 %-100 %] 100 % (10/06 0700) Weight:  [165 lb 9.1 oz (75.1 kg)] 165 lb 9.1 oz (75.1 kg) (10/06 0500)  Filed Weights   02/12/15 0500 02/13/15 0600 02/14/15 0500  Weight: 167 lb 12.3 oz (76.1 kg) 165 lb 12.6 oz (75.2 kg) 165 lb 9.1 oz (75.1 kg)    Weight change: -3.5 oz (-0.1 kg)   Intake/Output from previous day: 10/05 0701 - 10/06 0700 In: 839.8 [I.V.:639.8; IV Piggyback:200] Out: 1380 [Urine:1380]  Current Meds: Scheduled Meds: . amiodarone  200 mg Oral BID  . apixaban  2.5 mg Oral BID  . aspirin EC  81 mg Oral Daily  . atorvastatin  80 mg Oral q1800  . bisacodyl  10 mg Oral Daily   Or  . bisacodyl  10 mg Rectal Daily  . docusate sodium  200 mg Oral Daily  . insulin aspart  0-24 Units Subcutaneous 6 times per day  . insulin detemir  20 Units Subcutaneous BID  . pantoprazole  40 mg Oral Daily  . sodium chloride  10-40 mL Intracatheter Q12H  . sodium chloride  3 mL Intravenous Q12H   Continuous Infusions: . sodium chloride 10 mL/hr at 02/13/15 2200  . sodium chloride    . sodium chloride 20 mL/hr at 02/14/15 0400  . norepinephrine (LEVOPHED) Adult infusion Stopped (02/13/15 1400)   PRN Meds:.sodium chloride, hydroxypropyl methylcellulose / hypromellose, levalbuterol, metoprolol, morphine injection, ondansetron (ZOFRAN) IV, oxyCODONE, potassium  chloride, promethazine, sodium chloride, sodium chloride, traMADol  General appearance: alert, cooperative and no distress Heart: regular rate and rhythm Lungs: clear to auscultation bilaterally Abdomen: soft, non-tender; bowel sounds normal; no masses,  no organomegaly Extremities: edema trace Wound: clean and dry  Lab Results: CBC: Recent Labs  02/13/15 0400 02/14/15 0412  WBC 10.3 9.6  HGB 10.0* 10.1*  HCT 29.9* 29.3*  PLT 127* 150   BMET:  Recent Labs  02/13/15 0400 02/14/15 0412  NA 132* 134*  K 3.8 3.2*  CL 92* 93*  CO2 33* 30  GLUCOSE 156* 100*  BUN 13 14  CREATININE 1.06* 0.96  CALCIUM 8.8* 8.9    PT/INR: No results for input(s): LABPROT, INR in the last 72 hours. Radiology: No results found.   Assessment/Plan: S/P Procedure(s) (LRB): IABP Insertion (N/A)  1. CV- off all drips, blood pressure borderline at times- continue Amiodarone, will hold Beta Blocker for now 2. Pulm- off oxygen, no acute issues, continue IS- will get PA/LAT CXR in AM for baseline exam 3. Renal- creatinine WNL, + hypokalemia, replacement ordered, weight is at baseline, not on Lasix 4. Dry Eyes- patient with burning in eyes, denies itching, will try artificial tears for relief  5. DM- sugars controlled, continue current regimen 6. Dispo- patient stable, transfer to step down     Lowella Dandy 02/14/2015 7:59 AM   patient examined and medical record reviewed,agree with above note. Kathlee Nations Trigt III 02/14/2015

## 2015-02-14 NOTE — Evaluation (Signed)
Physical Therapy Evaluation Patient Details Name: [name] MRN: [id number] DOB: 07-13-1970 Today's Date: 02/14/2015   History of Present Illness  Adm 9/23 with CP, cardiac cath, CABG x4 02-21-2023; post-op coded x 3 with eventual insertion of IABP (removed 10/4); PMHx- medical non-compliance with DM, CAD, continues to smoke    Clinical Impression  Patient is s/p above surgery resulting in functional limitations due to the deficits listed below (see PT Problem List). Anticipate excellent progress based on her performance today. Patient will benefit from skilled PT to increase their independence and safety with mobility to allow discharge to the venue listed below.       Follow Up Recommendations No PT follow up    Equipment Recommendations  None recommended by PT    Recommendations for Other Services       Precautions / Restrictions Precautions Precautions: Sternal Precaution Comments: pt able to describe precautions in her own words Restrictions Weight Bearing Restrictions: No (sternal precautions)      Mobility  Bed Mobility Overal bed mobility: Needs Assistance Bed Mobility: Sidelying to Sit;Rolling Rolling: Supervision Sidelying to sit: Min assist;HOB elevated       General bed mobility comments: vc for technique to adhere to sternal precautions; able to use her legs over EOB to assist with side to sit  Transfers Overall transfer level: Needs assistance   Transfers: Sit to/from Stand Sit to Stand: Supervision         General transfer comment: vc for safe use of DME  Ambulation/Gait Ambulation/Gait assistance: Min guard Ambulation Distance (Feet): 300 Feet Assistive device: Rolling walker (2 wheeled) Gait Pattern/deviations: WFL(Within Functional Limits)   Gait velocity interpretation: Below normal speed for age/gender    Stairs            Wheelchair Mobility    Modified Rankin (Stroke Patients Only)       Balance Overall balance  assessment: Needs assistance         Standing balance support: No upper extremity supported Standing balance-Leahy Scale: Poor Standing balance comment: slight Lt lean on initial standing                             Pertinent Vitals/Pain HR 98-120 BP MAP 78 to 72 with mild dizziness  Pain Assessment: 0-10 Pain Score: 3  Pain Location: chest Pain Descriptors / Indicators: Sore Pain Intervention(s): Limited activity within patient's tolerance;Monitored during session;Repositioned    Home Living Family/patient expects to be discharged to:: Private residence Living Arrangements: Children Available Help at Discharge: Family (10 yo, 59 yo sons) Type of Home: House Home Access: Stairs to enter Entrance Stairs-Rails: None Secretary/administrator of Steps: 1 Home Layout: One level Home Equipment: None      Prior Function Level of Independence: Independent               Hand Dominance        Extremity/Trunk Assessment   Upper Extremity Assessment: Overall WFL for tasks assessed (within sternal precautions)           Lower Extremity Assessment: Overall WFL for tasks assessed      Cervical / Trunk Assessment: Normal  Communication   Communication: No difficulties  Cognition Arousal/Alertness: Awake/alert Behavior During Therapy: WFL for tasks assessed/performed Overall Cognitive Status: Within Functional Limits for tasks assessed                      General Comments General  comments (skin integrity, edema, etc.): Educated to walk 3x per day with nursing, PT, or Cardiac Rehab assist    Exercises General Exercises - Lower Extremity Ankle Circles/Pumps: AROM;Both;10 reps      Assessment/Plan    PT Assessment Patient needs continued PT services  PT Diagnosis Difficulty walking;Acute pain   PT Problem List Decreased activity tolerance;Decreased balance;Decreased knowledge of use of DME;Decreased knowledge of  precautions;Cardiopulmonary status limiting activity;Pain  PT Treatment Interventions DME instruction;Gait training;Functional mobility training;Therapeutic activities;Therapeutic exercise;Balance training;Patient/family education   PT Goals (Current goals can be found in the Care Plan section) Acute Rehab PT Goals Patient Stated Goal: go home with her sons PT Goal Formulation: With patient Time For Goal Achievement: 02/21/15 Potential to Achieve Goals: Good    Frequency Min 3X/week   Barriers to discharge        Co-evaluation               End of Session Equipment Utilized During Treatment: Gait belt Activity Tolerance: Patient tolerated treatment well Patient left: in chair;with call bell/phone within reach Nurse Communication: Mobility status         Time: 1610-9604 PT Time Calculation (min) (ACUTE ONLY): 29 min   Charges:   PT Evaluation $Initial PT Evaluation Tier I: 1 Procedure PT Treatments $Gait Training: 8-22 mins   PT G Codes:        Chelci [place] 2015-02-26, 2:55 PM Pager [phone]

## 2015-02-14 NOTE — Progress Notes (Signed)
Subjective:   s/p CABG X 3 complicated by PEA arrest/VT/ Shock/ CPR/IABP insertion. IABP  02/12/15   Off all inotropes. Sitting in chair. Wants to go home. SBP 90s. Walked 600 feet.   02/11/15 Echo EF 45%   . amiodarone  200 mg Oral BID  . apixaban  2.5 mg Oral BID  . aspirin EC  81 mg Oral Daily  . atorvastatin  80 mg Oral q1800  . bisacodyl  10 mg Oral Daily   Or  . bisacodyl  10 mg Rectal Daily  . docusate sodium  200 mg Oral Daily  . insulin aspart  0-24 Units Subcutaneous 6 times per day  . insulin detemir  20 Units Subcutaneous BID  . pantoprazole  40 mg Oral Daily  . sodium chloride  10-40 mL Intracatheter Q12H  . sodium chloride  3 mL Intravenous Q12H    Objective:  Temp:  [97.6 F (36.4 C)-98.7 F (37.1 C)] 98.7 F (37.1 C) (10/06 0400) Pulse Rate:  [97-121] 103 (10/06 0530) Resp:  [0-26] 15 (10/06 0300) BP: (89-140)/(55-91) 90/66 mmHg (10/06 0530) SpO2:  [95 %-100 %] 95 % (10/06 0530) Weight:  [75.2 kg (165 lb 12.6 oz)] 75.2 kg (165 lb 12.6 oz) (10/05 0600) Weight change:   Intake/Output from previous day: 10/05 0701 - 10/06 0700 In: 789.8 [I.V.:639.8; IV Piggyback:150] Out: 1380 [Urine:1380]  Intake/Output from this shift: Total I/O In: 230 [I.V.:180; IV Piggyback:50] Out: 300 [Urine:300]  Physical Exam: General appearance: awake. conversant Neck: supple JVP flat Lungs: clear Heart: regular rate and rhythm, S1, S2 normal, no murmur, click, rub or gallop Extremities: extremities normal, atraumatic, no cyanosis. LUE PICC.  R and L SCDs.  Abdomen: non tender. Non distended + Bowel Sounds.   Tele: Sinus Tach 100-110  Lab Results: Results for orders placed or performed during the hospital encounter of 02/01/15 (from the past 48 hour(s))  Prepare Pheresed Platelets     Status: None   Collection Time: 02/12/15  7:55 AM  Result Value Ref Range   Unit Number [id number]    Blood Component Type PLTPHER LR2    Unit division 00    Status of Unit  ISSUED,FINAL    Transfusion Status OK TO TRANSFUSE   Glucose, capillary     Status: Abnormal   Collection Time: 02/12/15  8:37 AM  Result Value Ref Range   Glucose-Capillary 167 (H) 65 - 99 mg/dL   Comment 1 Notify RN   POCT Activated clotting time     Status: None   Collection Time: 02/12/15 10:01 AM  Result Value Ref Range   Activated Clotting Time 147 seconds  Glucose, capillary     Status: Abnormal   Collection Time: 02/12/15 11:51 AM  Result Value Ref Range   Glucose-Capillary 133 (H) 65 - 99 mg/dL   Comment 1 Notify RN   Glucose, capillary     Status: Abnormal   Collection Time: 02/12/15  4:17 PM  Result Value Ref Range   Glucose-Capillary 127 (H) 65 - 99 mg/dL   Comment 1 Notify RN   I-STAT, chem 8     Status: Abnormal   Collection Time: 02/12/15  4:51 PM  Result Value Ref Range   Sodium 128 (L) 135 - 145 mmol/L   Potassium 3.4 (L) 3.5 - 5.1 mmol/L   Chloride 83 (L) 101 - 111 mmol/L   BUN 15 6 - 20 mg/dL   Creatinine, Ser 1.10 (H) 0.44 - 1.00 mg/dL   Glucose,  Bld 323 (H) 65 - 99 mg/dL   Calcium, Ion 1.07 (L) 1.12 - 1.23 mmol/L   TCO2 28 0 - 100 mmol/L   Hemoglobin 10.9 (L) 12.0 - 15.0 g/dL   HCT 32.0 (L) 36.0 - 46.0 %  I-STAT, chem 8     Status: Abnormal   Collection Time: 02/12/15  5:03 PM  Result Value Ref Range   Sodium 132 (L) 135 - 145 mmol/L   Potassium 3.6 3.5 - 5.1 mmol/L   Chloride 87 (L) 101 - 111 mmol/L   BUN 16 6 - 20 mg/dL   Creatinine, Ser 1.30 (H) 0.44 - 1.00 mg/dL   Glucose, Bld 137 (H) 65 - 99 mg/dL   Calcium, Ion 1.08 (L) 1.12 - 1.23 mmol/L   TCO2 33 0 - 100 mmol/L   Hemoglobin 10.5 (L) 12.0 - 15.0 g/dL   HCT 31.0 (L) 36.0 - 46.0 %  Glucose, capillary     Status: Abnormal   Collection Time: 02/12/15  7:33 PM  Result Value Ref Range   Glucose-Capillary 204 (H) 65 - 99 mg/dL   Comment 1 Capillary Specimen    Comment 2 Notify RN    Comment 3 Document in Chart   Glucose, capillary     Status: Abnormal   Collection Time: 02/12/15 11:43 PM    Result Value Ref Range   Glucose-Capillary 181 (H) 65 - 99 mg/dL   Comment 1 Capillary Specimen    Comment 2 Notify RN    Comment 3 Document in Chart   Glucose, capillary     Status: Abnormal   Collection Time: 02/13/15  3:57 AM  Result Value Ref Range   Glucose-Capillary 158 (H) 65 - 99 mg/dL   Comment 1 Capillary Specimen    Comment 2 Notify RN    Comment 3 Document in Chart   CBC     Status: Abnormal   Collection Time: 02/13/15  4:00 AM  Result Value Ref Range   WBC 10.3 4.0 - 10.5 K/uL   RBC 3.52 (L) 3.87 - 5.11 MIL/uL   Hemoglobin 10.0 (L) 12.0 - 15.0 g/dL   HCT 29.9 (L) 36.0 - 46.0 %   MCV 84.9 78.0 - 100.0 fL   MCH 28.4 26.0 - 34.0 pg   MCHC 33.4 30.0 - 36.0 g/dL   RDW 13.9 11.5 - 15.5 %   Platelets 127 (L) 150 - 400 K/uL    Comment: REPEATED TO VERIFY DELTA CHECK NOTED   Basic metabolic panel     Status: Abnormal   Collection Time: 02/13/15  4:00 AM  Result Value Ref Range   Sodium 132 (L) 135 - 145 mmol/L   Potassium 3.8 3.5 - 5.1 mmol/L   Chloride 92 (L) 101 - 111 mmol/L   CO2 33 (H) 22 - 32 mmol/L   Glucose, Bld 156 (H) 65 - 99 mg/dL   BUN 13 6 - 20 mg/dL   Creatinine, Ser 1.06 (H) 0.44 - 1.00 mg/dL   Calcium 8.8 (L) 8.9 - 10.3 mg/dL   GFR calc non Af Amer >60 >60 mL/min   GFR calc Af Amer >60 >60 mL/min    Comment: (NOTE) The eGFR has been calculated using the CKD EPI equation. This calculation has not been validated in all clinical situations. eGFR's persistently <60 mL/min signify possible Chronic Kidney Disease.    Anion gap 7 5 - 15  Carboxyhemoglobin     Status: Abnormal   Collection Time: 02/13/15  4:21 AM  Result Value   Ref Range   Total hemoglobin 8.5 (L) 12.0 - 16.0 g/dL   O2 Saturation 65.6 %   Carboxyhemoglobin 1.7 (H) 0.5 - 1.5 %   Methemoglobin 0.7 0.0 - 1.5 %  Glucose, capillary     Status: Abnormal   Collection Time: 02/13/15  7:35 AM  Result Value Ref Range   Glucose-Capillary 124 (H) 65 - 99 mg/dL   Comment 1 Capillary Specimen     Comment 2 Notify RN   Carboxyhemoglobin     Status: Abnormal   Collection Time: 02/13/15 12:05 PM  Result Value Ref Range   Total hemoglobin 10.6 (L) 12.0 - 16.0 g/dL   O2 Saturation 59.0 %   Carboxyhemoglobin 1.8 (H) 0.5 - 1.5 %   Methemoglobin 0.9 0.0 - 1.5 %  Glucose, capillary     Status: Abnormal   Collection Time: 02/13/15 12:12 PM  Result Value Ref Range   Glucose-Capillary 183 (H) 65 - 99 mg/dL   Comment 1 Capillary Specimen    Comment 2 Notify RN   Glucose, capillary     Status: Abnormal   Collection Time: 02/13/15  4:34 PM  Result Value Ref Range   Glucose-Capillary 183 (H) 65 - 99 mg/dL   Comment 1 Capillary Specimen    Comment 2 Notify RN   Glucose, capillary     Status: Abnormal   Collection Time: 02/13/15  7:34 PM  Result Value Ref Range   Glucose-Capillary 219 (H) 65 - 99 mg/dL   Comment 1 Capillary Specimen    Comment 2 Notify RN    Comment 3 Document in Chart   Glucose, capillary     Status: Abnormal   Collection Time: 02/13/15 11:53 PM  Result Value Ref Range   Glucose-Capillary 123 (H) 65 - 99 mg/dL   Comment 1 Capillary Specimen    Comment 2 Notify RN    Comment 3 Document in Chart   Glucose, capillary     Status: None   Collection Time: 02/14/15  3:51 AM  Result Value Ref Range   Glucose-Capillary 85 65 - 99 mg/dL   Comment 1 Capillary Specimen    Comment 2 Notify RN    Comment 3 Document in Chart   CBC     Status: Abnormal   Collection Time: 02/14/15  4:12 AM  Result Value Ref Range   WBC 9.6 4.0 - 10.5 K/uL   RBC 3.45 (L) 3.87 - 5.11 MIL/uL   Hemoglobin 10.1 (L) 12.0 - 15.0 g/dL   HCT 29.3 (L) 36.0 - 46.0 %   MCV 84.9 78.0 - 100.0 fL   MCH 29.3 26.0 - 34.0 pg   MCHC 34.5 30.0 - 36.0 g/dL   RDW 14.1 11.5 - 15.5 %   Platelets 150 150 - 400 K/uL  Basic metabolic panel     Status: Abnormal   Collection Time: 02/14/15  4:12 AM  Result Value Ref Range   Sodium 134 (L) 135 - 145 mmol/L   Potassium 3.2 (L) 3.5 - 5.1 mmol/L   Chloride 93 (L)  101 - 111 mmol/L   CO2 30 22 - 32 mmol/L   Glucose, Bld 100 (H) 65 - 99 mg/dL   BUN 14 6 - 20 mg/dL   Creatinine, Ser 0.96 0.44 - 1.00 mg/dL   Calcium 8.9 8.9 - 10.3 mg/dL   GFR calc non Af Amer >60 >60 mL/min   GFR calc Af Amer >60 >60 mL/min    Comment: (NOTE) The eGFR has been calculated using   the CKD EPI equation. This calculation has not been validated in all clinical situations. eGFR's persistently <60 mL/min signify possible Chronic Kidney Disease.    Anion gap 11 5 - 15  Carboxyhemoglobin     Status: Abnormal   Collection Time: 02/14/15  4:15 AM  Result Value Ref Range   Total hemoglobin 9.9 (L) 12.0 - 16.0 g/dL   O2 Saturation 60.9 %   Carboxyhemoglobin 1.8 (H) 0.5 - 1.5 %   Methemoglobin 0.8 0.0 - 1.5 %    Imaging: Imaging results have been reviewed  Assessment/Plan:   1. Cardiogenic shock 2. Cardiac arrest 3. CAD with NSTEMI    -s/p CABG 9/29 4. Diabetes 5. Ventricular tachycardia  6. 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 Acute blood loss anemia    Much improved today. BP soft but stable off all inotropes. Volume status looks good. Can go to circle bed. BP too low for b-blocker. Continue ASA, statin. Hopefully home soon.   Await SRA panel to assess for HIT. Will use low-dose Eliquis for DVT prophylaxis. Continue low-dose amio for VT can stop as outpatient,.   Danielys Madry,MD 5:37 AM

## 2015-02-14 NOTE — Progress Notes (Signed)
Inpatient Diabetes Program Recommendations  AACE/ADA: New Consensus Statement on Inpatient Glycemic Control (2015)  Target Ranges:  Prepandial:   less than 140 mg/dL      Peak postprandial:   less than 180 mg/dL (1-2 hours)      Critically ill patients:  140 - 180 mg/dL   Review of Glycemic Control:  Results for [name], [name] (MRN [id number]) as of 02/14/2015 13:17  Ref. Range 02/13/2015 19:34 02/13/2015 23:53 02/14/2015 03:51 02/14/2015 08:30 02/14/2015 12:00  Glucose-Capillary Latest Ref Range: 65-99 mg/dL 409 (H) 811 (H) 85 914 (H) 245 (H)    Note that CBG elevated at lunch time.  Consider adding Novolog meal coverage 4 units tid with meals.  Also may consider reducing Novolog correction to moderate.  Thanks, Beryl Meager, RN, BC-ADM Inpatient Diabetes Coordinator Pager [phone] (8a-5p)

## 2015-02-14 NOTE — Care Management Note (Signed)
Case Management Note  Patient Details  Name: [name] MRN: [id number] Date of Birth: 11-04-1970  Subjective/Objective:        Awake, alert, states lives with two sons and plans for discharge back home with sons. Confirmed they will be with her 24/7.  States she has been getting her meds from [facility] on 4 dollar list.  Is eligible for Medicaid, and process has been started.             Action/Plan:   Expected Discharge Date:                  Expected Discharge Plan:  Home/Self Care  In-House Referral:     Discharge planning Services  CM Consult, [facility]  Post Acute Care Choice:    Choice offered to:     DME Arranged:    DME Agency:     HH Arranged:    HH Agency:     Status of Service:  In process, will continue to follow  Medicare Important Message Given:    Date Medicare IM Given:    Medicare IM give by:    Date Additional Medicare IM Given:    Additional Medicare Important Message give by:     If discussed at Long Length of Stay Meetings, dates discussed:    Additional Comments:  Vangie Bicker, RN 02/14/2015, 11:03 AM

## 2015-02-15 ENCOUNTER — Inpatient Hospital Stay (HOSPITAL_COMMUNITY): Payer: Medicaid Other

## 2015-02-15 LAB — BASIC METABOLIC PANEL
Anion gap: 12 (ref 5–15)
BUN: 14 mg/dL (ref 6–20)
CO2: 28 mmol/L (ref 22–32)
Calcium: 9 mg/dL (ref 8.9–10.3)
Chloride: 91 mmol/L — ABNORMAL LOW (ref 101–111)
Creatinine, Ser: 0.93 mg/dL (ref 0.44–1.00)
GFR calc Af Amer: >60 mL/min (ref >60)
GFR calc non Af Amer: >60 mL/min (ref >60)
Glucose, Bld: 150 mg/dL — ABNORMAL HIGH (ref 65–99)
Potassium: 3.8 mmol/L (ref 3.5–5.1)
Sodium: 131 mmol/L — ABNORMAL LOW (ref 135–145)

## 2015-02-15 LAB — GLUCOSE, CAPILLARY
GLUCOSE-CAPILLARY: 197 mg/dL — AB (ref 65–99)
GLUCOSE-CAPILLARY: 151 mg/dL — AB (ref 65–99)
Glucose-Capillary: 132 mg/dL — ABNORMAL HIGH (ref 65–99)
Glucose-Capillary: 179 mg/dL — ABNORMAL HIGH (ref 65–99)

## 2015-02-15 LAB — CBC
HCT: 28.7 % — ABNORMAL LOW (ref 36.0–46.0)
Hemoglobin: 9.7 g/dL — ABNORMAL LOW (ref 12.0–15.0)
MCH: 28.6 pg (ref 26.0–34.0)
MCHC: 33.8 g/dL (ref 30.0–36.0)
MCV: 84.7 fL (ref 78.0–100.0)
Platelets: 201 10*3/uL (ref 150–400)
RBC: 3.39 MIL/uL — ABNORMAL LOW (ref 3.87–5.11)
RDW: 13.9 % (ref 11.5–15.5)
WBC: 9.3 10*3/uL (ref 4.0–10.5)

## 2015-02-15 MED ORDER — SODIUM CHLORIDE 0.9 % IJ SOLN
10.0000 mL | INTRAMUSCULAR | Status: DC | PRN
  Administered 2015-02-15: 30 mL

## 2015-02-15 NOTE — Progress Notes (Signed)
Subjective:   Feels good. Eager to go home. SBP much improved 105-134. Weight stable. 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 amiodarone  200 mg Oral BID  . apixaban  2.5 mg Oral BID  . aspirin EC  81 mg Oral Daily  . atorvastatin  80 mg Oral q1800  . bisacodyl  10 mg Oral Daily   Or  . bisacodyl  10 mg Rectal Daily  . docusate sodium  200 mg Oral Daily  . insulin aspart  0-24 Units Subcutaneous TID AC & HS  . insulin detemir  20 Units Subcutaneous BID  . pantoprazole  40 mg Oral Daily  . potassium chloride  20 mEq Oral Daily  . sodium chloride  10-40 mL Intracatheter Q12H  . sodium chloride  3 mL Intravenous Q12H  . sodium chloride  3 mL Intravenous Q12H    Objective:  Temp:  [97.6 F (36.4 C)-98.2 F (36.8 C)] 98.2 F (36.8 C) (10/07 1606) Pulse Rate:  [93-99] 96 (10/07 1606) Resp:  [12-31] 18 (10/07 1606) BP: (105-134)/(59-86) 134/86 mmHg (10/07 1606) SpO2:  [98 %-100 %] 100 % (10/07 1606) Weight:  [74.5 kg (164 lb 3.9 oz)] 74.5 kg (164 lb 3.9 oz) (10/07 0500) Weight change: -0.6 kg (-1 lb 5.2 oz)  Intake/Output from previous day: 10/06 0701 - 10/07 0700 In: 470 [I.V.:420; IV Piggyback:50] Out: 1200 [Urine:1200]  Intake/Output from this shift:    Physical Exam: General appearance: awake. conversant Neck: supple JVP flat Lungs: clear Heart: regular rate and rhythm, S1, S2 normal, no murmur, click, rub or gallop Extremities: extremities normal, atraumatic, no cyanosis. LUE PICC.  R and L SCDs.  Abdomen: non tender. Non distended + Bowel Sounds.   Tele: Sinus 90-100  Lab Results: Results for orders placed or performed during the hospital encounter of 02/01/15 (from the past 48 hour(s))  Glucose, capillary     Status: Abnormal   Collection Time: 02/13/15 11:53 PM  Result Value Ref Range   Glucose-Capillary 123 (H) 65 - 99 mg/dL   Comment 1 Capillary Specimen    Comment 2 Notify RN    Comment 3 Document in Chart   Glucose, capillary     Status: None    Collection Time: 02/14/15  3:51 AM  Result Value Ref Range   Glucose-Capillary 85 65 - 99 mg/dL   Comment 1 Capillary Specimen    Comment 2 Notify RN    Comment 3 Document in Chart   CBC     Status: Abnormal   Collection Time: 02/14/15  4:12 AM  Result Value Ref Range   WBC 9.6 4.0 - 10.5 K/uL   RBC 3.45 (L) 3.87 - 5.11 MIL/uL   Hemoglobin 10.1 (L) 12.0 - 15.0 g/dL   HCT 29.3 (L) 36.0 - 46.0 %   MCV 84.9 78.0 - 100.0 fL   MCH 29.3 26.0 - 34.0 pg   MCHC 34.5 30.0 - 36.0 g/dL   RDW 14.1 11.5 - 15.5 %   Platelets 150 150 - 400 K/uL  Basic metabolic panel     Status: Abnormal   Collection Time: 02/14/15  4:12 AM  Result Value Ref Range   Sodium 134 (L) 135 - 145 mmol/L   Potassium 3.2 (L) 3.5 - 5.1 mmol/L   Chloride 93 (L) 101 - 111 mmol/L   CO2 30 22 - 32 mmol/L   Glucose, Bld 100 (H) 65 - 99 mg/dL   BUN 14 6 - 20  mg/dL   Creatinine, Ser 0.96 0.44 - 1.00 mg/dL   Calcium 8.9 8.9 - 10.3 mg/dL   GFR calc non Af Amer >60 >60 mL/min   GFR calc Af Amer >60 >60 mL/min    Comment: (NOTE) The eGFR has been calculated using the CKD EPI equation. This calculation has not been validated in all clinical situations. eGFR's persistently <60 mL/min signify possible Chronic Kidney Disease.    Anion gap 11 5 - 15  Carboxyhemoglobin     Status: Abnormal   Collection Time: 02/14/15  4:15 AM  Result Value Ref Range   Total hemoglobin 9.9 (L) 12.0 - 16.0 g/dL   O2 Saturation 60.9 %   Carboxyhemoglobin 1.8 (H) 0.5 - 1.5 %   Methemoglobin 0.8 0.0 - 1.5 %  Glucose, capillary     Status: Abnormal   Collection Time: 02/14/15  8:30 AM  Result Value Ref Range   Glucose-Capillary 114 (H) 65 - 99 mg/dL   Comment 1 Notify RN   Glucose, capillary     Status: Abnormal   Collection Time: 02/14/15 12:00 PM  Result Value Ref Range   Glucose-Capillary 245 (H) 65 - 99 mg/dL   Comment 1 Notify RN   Glucose, capillary     Status: Abnormal   Collection Time: 02/14/15  4:57 PM  Result Value Ref Range     Glucose-Capillary 117 (H) 65 - 99 mg/dL   Comment 1 Notify RN   Glucose, capillary     Status: Abnormal   Collection Time: 02/14/15  9:54 PM  Result Value Ref Range   Glucose-Capillary 241 (H) 65 - 99 mg/dL   Comment 1 Capillary Specimen    Comment 2 Notify RN    Comment 3 Document in Chart   CBC     Status: Abnormal   Collection Time: 02/15/15  5:50 AM  Result Value Ref Range   WBC 9.3 4.0 - 10.5 K/uL   RBC 3.39 (L) 3.87 - 5.11 MIL/uL   Hemoglobin 9.7 (L) 12.0 - 15.0 g/dL   HCT 28.7 (L) 36.0 - 46.0 %   MCV 84.7 78.0 - 100.0 fL   MCH 28.6 26.0 - 34.0 pg   MCHC 33.8 30.0 - 36.0 g/dL   RDW 13.9 11.5 - 15.5 %   Platelets 201 150 - 400 K/uL  Basic metabolic panel     Status: Abnormal   Collection Time: 02/15/15  5:50 AM  Result Value Ref Range   Sodium 131 (L) 135 - 145 mmol/L   Potassium 3.8 3.5 - 5.1 mmol/L   Chloride 91 (L) 101 - 111 mmol/L   CO2 28 22 - 32 mmol/L   Glucose, Bld 150 (H) 65 - 99 mg/dL   BUN 14 6 - 20 mg/dL   Creatinine, Ser 0.93 0.44 - 1.00 mg/dL   Calcium 9.0 8.9 - 10.3 mg/dL   GFR calc non Af Amer >60 >60 mL/min   GFR calc Af Amer >60 >60 mL/min    Comment: (NOTE) The eGFR has been calculated using the CKD EPI equation. This calculation has not been validated in all clinical situations. eGFR's persistently <60 mL/min signify possible Chronic Kidney Disease.    Anion gap 12 5 - 15  Glucose, capillary     Status: Abnormal   Collection Time: 02/15/15  8:03 AM  Result Value Ref Range   Glucose-Capillary 132 (H) 65 - 99 mg/dL   Comment 1 Capillary Specimen    Comment 2 Notify RN   Glucose, capillary       Status: Abnormal   Collection Time: 02/15/15 12:45 PM  Result Value Ref Range   Glucose-Capillary 179 (H) 65 - 99 mg/dL   Comment 1 Capillary Specimen    Comment 2 Notify RN   Glucose, capillary     Status: Abnormal   Collection Time: 02/15/15  6:04 PM  Result Value Ref Range   Glucose-Capillary 197 (H) 65 - 99 mg/dL   Comment 1 Notify RN     Comment 2 Document in Chart     Imaging: Imaging results have been reviewed  Assessment/Plan:   1. Cardiogenic shock 2. Cardiac arrest 3. CAD with NSTEMI    -s/p CABG 9/29 4. Diabetes 5. Ventricular tachycardia  6. 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 BP improved. Volume status looks good. Continue ASA, statin. Will try to add low-dose lisinopril back tomorrow. Should be ready for d/c soon. CR to see.    HIT+.  Continue low-dose Eliquis for DVT prophylaxis. Continue low-dose amio for VT can stop as outpatient,.   Terry Bolotin,MD 8:29 PM

## 2015-02-15 NOTE — Progress Notes (Signed)
Physical Therapy Treatment and Discharge Patient Details Name: JURLINE FOLGER MRN: [id number] DOB: 26-Mar-1971 Today's Date: 01-Mar-2015    History of Present Illness Adm 9/23 with CP, cardiac cath, CABG x4 21-Feb-2023; post-op coded x 3 with eventual insertion of IABP (removed 10/4); PMHx- medical non-compliance with DM, CAD, continues to smoke    PT Comments    Patient doing great! Able to state and adhere to sternal precautions. Walking independently with no device or unsteadiness. Pt agrees with discharge from PT.   Follow Up Recommendations  No PT follow up     Equipment Recommendations  None recommended by PT    Recommendations for Other Services       Precautions / Restrictions Precautions Precautions: Sternal Precaution Comments: pt able to describe precautions in her own words; adhered without cues Restrictions Weight Bearing Restrictions: No (sternal precautions)    Mobility  Bed Mobility                  Transfers Overall transfer level: Independent Equipment used: None Transfers: Sit to/from Stand Sit to Stand: Independent            Ambulation/Gait Ambulation/Gait assistance: Independent Ambulation Distance (Feet): 150 Feet Assistive device: None Gait Pattern/deviations: WFL(Within Functional Limits)   Gait velocity interpretation: at or above normal speed for age/gender     Stairs            Wheelchair Mobility    Modified Rankin (Stroke Patients Only)       Balance             Standing balance-Leahy Scale: Good                      Cognition Arousal/Alertness: Awake/alert Behavior During Therapy: WFL for tasks assessed/performed Overall Cognitive Status: Within Functional Limits for tasks assessed                      Exercises      General Comments        Pertinent Vitals/Pain Pain Assessment: No/denies pain    Home Living                      Prior Function            PT  Goals (current goals can now be found in the care plan section) Acute Rehab PT Goals Patient Stated Goal: go home with her sons PT Goal Formulation: With patient Time For Goal Achievement: 02/21/15 Potential to Achieve Goals: Good Progress towards PT goals: Goals met/education completed, patient discharged from PT    Frequency       PT Plan Current plan remains appropriate    Co-evaluation             End of Session   Activity Tolerance: Patient tolerated treatment well Patient left: in chair;with call bell/phone within reach;with family/visitor present     Time: 0354-6568 PT Time Calculation (min) (ACUTE ONLY): 11 min  Charges:  $Gait Training: 8-22 mins                    G Codes:      Joyceann Kruser 03-01-15, 4:30 PM Pager [phone]

## 2015-02-15 NOTE — Progress Notes (Signed)
Patient ID: [name], female   DOB: 1970-07-14, 44 y.o.   MRN: [id number] [facility] DAILY ICU PROGRESS NOTE                   [address]            [place] [id number]          [phone]   8 Days Post-Op Procedure(s) (LRB): IABP Insertion (N/A)  Total Length of Stay:  LOS: 14 days   Subjective:  Feels better today, aleet and wants to go to stepdown so she can get home  Objective: Vital signs in last 24 hours: Temp:  [97.6 F (36.4 C)-98.3 F (36.8 C)] 97.9 F (36.6 C) (10/07 1247) Pulse Rate:  [93-110] 96 (10/07 0815) Cardiac Rhythm:  [-] Sinus tachycardia;Normal sinus rhythm (10/07 0900) Resp:  [12-31] 15 (10/07 0815) BP: (93-127)/(59-84) 105/73 mmHg (10/07 0815) SpO2:  [98 %-100 %] 100 % (10/07 0815) Weight:  [164 lb 3.9 oz (74.5 kg)] 164 lb 3.9 oz (74.5 kg) (10/07 0500)  Filed Weights   02/13/15 0600 02/14/15 0500 02/15/15 0500  Weight: 165 lb 12.6 oz (75.2 kg) 165 lb 9.1 oz (75.1 kg) 164 lb 3.9 oz (74.5 kg)    Weight change: -1 lb 5.2 oz (-0.6 kg)   Intake/Output from previous day: 10/06 0701 - 10/07 0700 In: 470 [I.V.:420; IV Piggyback:50] Out: 1200 [Urine:1200]  Current Meds: Scheduled Meds: . amiodarone  200 mg Oral BID  . apixaban  2.5 mg Oral BID  . aspirin EC  81 mg Oral Daily  . atorvastatin  80 mg Oral q1800  . bisacodyl  10 mg Oral Daily   Or  . bisacodyl  10 mg Rectal Daily  . docusate sodium  200 mg Oral Daily  . insulin aspart  0-24 Units Subcutaneous TID AC & HS  . insulin detemir  20 Units Subcutaneous BID  . pantoprazole  40 mg Oral Daily  . potassium chloride  20 mEq Oral Daily  . sodium chloride  10-40 mL Intracatheter Q12H  . sodium chloride  3 mL Intravenous Q12H  . sodium chloride  3 mL Intravenous Q12H   Continuous Infusions: . sodium chloride 10 mL/hr at 02/13/15 2200  . sodium chloride Stopped (02/15/15 0400)   PRN Meds:.sodium chloride, sodium chloride, levalbuterol, magnesium hydroxide, metoprolol,  ondansetron (ZOFRAN) IV, oxyCODONE, polyvinyl alcohol, promethazine, sodium chloride, sodium chloride, traMADol  General appearance: alert, cooperative and no distress Heart: regular rate and rhythm Lungs: clear to auscultation bilaterally Abdomen: soft, non-tender; bowel sounds normal; no masses,  no organomegaly Extremities: edema trace Wound: clean and dry  Lab Results: CBC:  Recent Labs  02/14/15 0412 02/15/15 0550  WBC 9.6 9.3  HGB 10.1* 9.7*  HCT 29.3* 28.7*  PLT 150 201   BMET:   Recent Labs  02/14/15 0412 02/15/15 0550  NA 134* 131*  K 3.2* 3.8  CL 93* 91*  CO2 30 28  GLUCOSE 100* 150*  BUN 14 14  CREATININE 0.96 0.93  CALCIUM 8.9 9.0    PT/INR: No results for input(s): LABPROT, INR in the last 72 hours. Radiology: Dg Chest 2 View  02/15/2015   CLINICAL DATA:  Atelectasis.  EXAM: CHEST  2 VIEW  COMPARISON:  None.  FINDINGS: PICC line in stable position. Mediastinum and hilar structures normal. Cardiomegaly with normal pulmonary vascularity. Prior CABG. No focal pulmonary infiltrate. No pleural effusion or pneumothorax.  IMPRESSION: 1.  PICC line stable position.  No  acute pulmonary disease.  2. Prior CABG.  Cardiomegaly.  No CHF.   Electronically Signed   By: Maisie Fus  Register   On: 02/15/2015 08:54     Assessment/Plan: S/P Procedure(s) (LRB): IABP Insertion (N/A)  1. CV- off all drips, blood pressure borderline at times- continue Amiodarone, will hold Beta Blocker for now 2. Pulm- off oxygen 3. Renal- creatinine WNL, + hypokalemia, replacement ordered, weight is at baseline, not on Lasix 4. DM- sugars controlled, continue current regimen Has been waiting 24 hours for stepdown bed, going this afternoon     Delight Ovens 02/15/2015 3:17 PM   patient examined and medical record reviewed,agree with above note. Delight Ovens 02/15/2015

## 2015-02-16 LAB — GLUCOSE, CAPILLARY: GLUCOSE-CAPILLARY: 102 mg/dL — AB (ref 65–99)

## 2015-02-16 MED ORDER — AMIODARONE HCL 200 MG PO TABS: 200.0000 mg | ORAL_TABLET | Freq: Every day | ORAL | Status: DC

## 2015-02-16 MED ORDER — METFORMIN HCL 500 MG PO TABS
500.0000 mg | ORAL_TABLET | Freq: Two times a day (BID) | ORAL | Status: DC
  Administered 2015-02-16: 500 mg via ORAL
  Filled 2015-02-16: qty 1

## 2015-02-16 MED ORDER — AMIODARONE HCL 200 MG PO TABS
200.0000 mg | ORAL_TABLET | Freq: Every day | ORAL | Status: DC
  Administered 2015-02-16: 200 mg via ORAL
  Filled 2015-02-16: qty 1

## 2015-02-16 MED ORDER — OXYCODONE HCL 5 MG PO TABS: 5.0000 mg | ORAL_TABLET | ORAL | Status: DC | PRN

## 2015-02-16 MED FILL — Medication: Qty: 1 | Status: AC

## 2015-02-16 NOTE — Discharge Summary (Signed)
Physician Discharge Summary  Patient ID: EVERLENE CUNNING MRN: [id number] DOB/AGE: Sep 24, 1970 44 y.o.  Admit date: 02/01/2015 Discharge date: 02/16/2015  Admission Diagnoses: Unstable angina and non-ST elevation myocardial infarction                    Discharge Diagnoses:  Principal Problem:   Coronary artery disease involving native coronary artery with unstable angina pectoris [facility]) Active Problems:   Unstable angina (HCC)   CAD S/P percutaneous coronary angioplasty   Essential hypertension   Hyperlipidemia with target LDL less than 70   Diabetes mellitus type 2 with complications (HCC)   Carotid artery disease (HCC)   Groin hematoma   Anemia associated with acute blood loss   S/P CABG x 3  Patient Active Problem List   Diagnosis Date Noted  . S/P CABG x 3 02/07/2015  . Groin hematoma 02/04/2015  . Anemia associated with acute blood loss 02/04/2015  . Carotid artery disease (HCC)   . Coronary artery disease involving native coronary artery with unstable angina pectoris (HCC) 02/01/2015  . Coronary artery disease due to lipid rich plaque   . Essential hypertension 10/03/2014  . Hyperlipidemia with target LDL less than 70 10/03/2014  . Diabetes mellitus type 2 with complications (HCC) 10/03/2014  . Unstable angina (HCC) 09/07/2014  . CAD S/P percutaneous coronary angioplasty   . NSTEMI (non-ST elevated myocardial infarction) (HCC) 05/29/2014   History of present illness:  The patient is a 44 year old female with history of coronary artery disease status post previous left heart catheterization in April 2016 at which time she underwent DES to 95% proximal RCA lesion. She is known to have a residual 78 and 80% lesion in the origin of the AV groove, 90% ostial PDA stenosis. Other significant cardiac comorbidities include hypertension, diabetes mellitus type 2, hyperlipidemia, and tobacco abuse. She presented to the emergency department on 01/31/2015 with chest pain. She notes a  progressive history of angina over the previous 2 weeks. The symptoms were notably worse with exertion and also include radiation to her left arm. Symptoms were described as a pressure, burning-like pain. Symptoms would last approximately 1 minute relieved with rest and also was associated with dyspnea. There is also some noted radiation to the back. She states she quit smoking approximately 1 and 1 half weeks prior to presentation. She was felt to require admission for further evaluation and treatment to include cardiac catheterization. She initially left AMA but did re-presented to the emergency department and subsequently was admitted with a non-ST segment elevation myocardial infarction.    Discharged Condition: good  Hospital Course: The patient was admitted for further evaluation and treatment to include cardiac catheterization. This was done and revealed stenosis of the circumflex origin and stenosis of the proximal RCA. The LAD had diffuse distal disease. Ejection fraction is 45%. She was felt to be high risk for PCI and cardiac surgical consultation was obtained with Kathlee Nations Trigt who evaluated the patient and her studies and agreed with recommendations to proceed with coronary artery surgical revascularization. She was placed on heparin and started on a course of progressive angina management. She was on Brilenta this required a period of washout. She is felt to be stable to proceed with surgery on 02/07/2015 and underwent the following procedure:  DATE OF PROCEDURE: 02/07/2015 DATE OF DISCHARGE:   OPERATIVE REPORT   OPERATION: 1. Coronary artery bypass grafting x3 (left internal mammary artery to  left anterior descending, saphenous vein graft to posterior  descending, saphenous vein graft to obtuse marginal 1). 2. Endoscopic harvest of left leg greater saphenous vein, exposure,  but not harvest of saphenous vein in right leg due to small  size  and inadequate for conduit use.  PREOPERATIVE DIAGNOSES: Unstable angina, non-ST-elevation myocardial infarction.  POSTOPERATIVE DIAGNOSES: Unstable angina, non-ST-elevation myocardial infarction.  SURGEON: Kerin Perna, M.D.  ASSISTANT: Lowella Dandy, PA-C.  ANESTHESIA: General by Dr. Autumn Patty.   Postoperative hospital course:  Postoperatively the patient developed PEA arrest/V. tach/cardiogenic shock and required placement of an intra-aortic balloon pump. Consultation was also obtained with heart failure team and bedside 2-D echocardiogram showed ejection fraction of 45% with inferior hypokinesis similar to pre-CABG study. There was no paracardial effusion. She required aggressive inotropic support initially and was maintained on the ventilator. Troponins were positive but EKG showed no change. She was able to be extubated without difficulty on postoperative day #1. Inotropic support was weaned to very gently and potentially the IABP was able to be removed.  She also required significant diuretics for volume overload. She had a postoperative thrombocytopenia treated with bivalirudin. 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 She was treated with Eliquis for DVT prophylaxis which was stopped at time of discharge. Overall she is felt to be making very steady progress and is ready for discharge on today's date. Her blood pressure is somewhat on the low side so she is not a candidate currently for ACE inhibitor.  Consults: cardiology and Cardiac surgery  Significant Diagnostic Studies: angiography: cardiac cath  Treatments: surgery: as above  Discharge Exam: Blood pressure 101/69, pulse 94, temperature 98.4 F (36.9 C), temperature source Oral, resp. rate 19, height  (1.6 m), weight 164 lb (74.39 kg), SpO2 100 %.   General appearance: alert, cooperative and no distress Heart: regular rate and rhythm Lungs: clear to auscultation bilaterally Abdomen:  benign Extremities: no edema Wound: incis healing well  Disposition: home    Medication List    STOP taking these medications        lisinopril 10 MG tablet  Commonly known as:  PRINIVIL,ZESTRIL     metoprolol tartrate 25 MG tablet  Commonly known as:  LOPRESSOR     nitroGLYCERIN 0.4 MG SL tablet  Commonly known as:  NITROSTAT     ticagrelor 90 MG Tabs tablet  Commonly known as:  BRILINTA     trolamine salicylate 10 % cream  Commonly known as:  ASPERCREME      TAKE these medications        amiodarone 200 MG tablet  Commonly known as:  PACERONE  Take 1 tablet (200 mg total) by mouth daily.     aspirin EC 81 MG tablet  Take 81 mg by mouth daily.     atorvastatin 80 MG tablet  Commonly known as:  LIPITOR  Take 1 tablet (80 mg total) by mouth daily at 6 PM.     Fish Oil 1000 MG Caps  Take 1,000 mg by mouth at bedtime.     insulin glargine 100 UNIT/ML injection  Commonly known as:  LANTUS  Inject 20 Units into the skin at bedtime.     metFORMIN 500 MG tablet  Commonly known as:  GLUCOPHAGE  Take 1 tablet (500 mg total) by mouth 2 (two) times daily with a meal.     oxyCODONE 5 MG immediate release tablet  Commonly known as:  Oxy IR/ROXICODONE  Take 1-2 tablets (5-10 mg total) by mouth every 4 (four) hours as needed for severe pain.  Follow-up Information    Follow up with Mikey Bussing, MD.   Specialty:  Cardiothoracic Surgery   Why:  office will contact you for an appt to see surgeon in 4 weeks   Contact information:   [address] [place] [address] [phone]       Follow up with [name], MD.   Specialties:  Cardiology, Radiology   Why:  arrange for appt in 2 weeks- call office and remind them you had heart surgery   Contact information:   [address] [place] [address] [phone]       Signed: Rowe Clack 02/16/2015, 1:05 PM

## 2015-02-16 NOTE — Progress Notes (Signed)
CARDIAC REHAB PHASE I   PRE:  Rate/Rhythm: Sinus Tach 101  BP:    Sitting: 102/68     SaO2: 95% Room Air  MODE:  Ambulation: 450 ft   POST:  Rate/Rhythem: Sinus tach 112  BP:    Sitting: 118/72     SaO2: 100% on room air  1328-1405 Patient ambulated in the hallway without difficulty or complaints. Reviewed exercise instructions and sternal precautions with patient. Heart healthy diet handout given to the patient. Miss Doi was given smoking cessation information and the phone number to 1-800-quit-now.  Miss Sachs is interested in phase 2 cardiac rehab. Permission granted by the patient to contact her at home. Discharge open heart surgery video set up for the patient to watch.  Whitaker, Arta Bruce RN BSN

## 2015-02-16 NOTE — Progress Notes (Addendum)
[address]       [place] [address]             [phone]      9 Days Post-Op Procedure(s) (LRB): IABP Insertion (N/A) Subjective: Feels ok, no new issues  Objective: Vital signs in last 24 hours: Temp:  [97.9 F (36.6 C)-98.4 F (36.9 C)] 98.4 F (36.9 C) (10/08 0519) Pulse Rate:  [93-97] 93 (10/08 0519) Cardiac Rhythm:  [-] Normal sinus rhythm (10/07 1945) Resp:  [18-19] 19 (10/08 0519) BP: (96-134)/(63-86) 96/63 mmHg (10/08 0519) SpO2:  [100 %] 100 % (10/08 0519) Weight:  [164 lb (74.39 kg)] 164 lb (74.39 kg) (10/08 0519)  Hemodynamic parameters for last 24 hours:    Intake/Output from previous day: 10/07 0701 - 10/08 0700 In: 480 [P.O.:480] Out: -  Intake/Output this shift:    General appearance: alert, cooperative and no distress Heart: regular rate and rhythm Lungs: clear to auscultation bilaterally Abdomen: benign Extremities: no edema Wound: incis healing well  Lab Results:  Recent Labs  02/14/15 0412 02/15/15 0550  WBC 9.6 9.3  HGB 10.1* 9.7*  HCT 29.3* 28.7*  PLT 150 201   BMET:  Recent Labs  02/14/15 0412 02/15/15 0550  NA 134* 131*  K 3.2* 3.8  CL 93* 91*  CO2 30 28  GLUCOSE 100* 150*  BUN 14 14  CREATININE 0.96 0.93  CALCIUM 8.9 9.0    PT/INR: No results for input(s): LABPROT, INR in the last 72 hours. ABG    Component Value Date/Time   PHART 7.473* 02/11/2015 0545   HCO3 30.8* 02/11/2015 0545   TCO2 33 02/12/2015 1703   ACIDBASEDEF 3.9* 02/10/2015 0500   O2SAT 60.9 02/14/2015 0415   CBG (last 3)   Recent Labs  02/15/15 1804 02/15/15 2043 02/16/15 0633  GLUCAP 197* 151* 102*    Meds Scheduled Meds: . amiodarone  200 mg Oral BID  . apixaban  2.5 mg Oral BID  . aspirin EC  81 mg Oral Daily  . atorvastatin  80 mg Oral q1800  . bisacodyl  10 mg Oral Daily   Or  . bisacodyl  10 mg Rectal Daily  . docusate sodium  200 mg Oral Daily  . insulin aspart  0-24 Units Subcutaneous TID AC & HS    . insulin detemir  20 Units Subcutaneous BID  . pantoprazole  40 mg Oral Daily  . potassium chloride  20 mEq Oral Daily  . sodium chloride  10-40 mL Intracatheter Q12H  . sodium chloride  3 mL Intravenous Q12H  . sodium chloride  3 mL Intravenous Q12H   Continuous Infusions: . sodium chloride 10 mL/hr at 02/13/15 2200  . sodium chloride Stopped (02/15/15 0400)   PRN Meds:.sodium chloride, sodium chloride, levalbuterol, magnesium hydroxide, metoprolol, ondansetron (ZOFRAN) IV, oxyCODONE, polyvinyl alcohol, promethazine, sodium chloride, sodium chloride, sodium chloride, traMADol  Xrays Dg Chest 2 View  02/15/2015   CLINICAL DATA:  Atelectasis.  EXAM: CHEST  2 VIEW  COMPARISON:  None.  FINDINGS: PICC line in stable position. Mediastinum and hilar structures normal. Cardiomegaly with normal pulmonary vascularity. Prior CABG. No focal pulmonary infiltrate. No pleural effusion or pneumothorax.  IMPRESSION: 1.  PICC line stable position.  No acute pulmonary disease.  2. Prior CABG.  Cardiomegaly.  No CHF.   Electronically Signed   By: Maisie Fus  Register   On: 02/15/2015 08:54    Assessment/Plan: S/P Procedure(s) (LRB): IABP Insertion (N/A)  1 conts to  do well- will d/c epw's this am 2 sugars adeq controlled- creat normal, will resume home glucophage 3 BP too low at times for ACEI 4 poss home later today vs am  LOS: 15 days    GOLD,WAYNE E 02/16/2015  Agree with above. Home today.

## 2015-02-16 NOTE — Progress Notes (Signed)
Removed CT sutures and applied benzoin and 1/2" steri strips. Pt given signs and symptoms of infection. Pt/family given discharge instructions, medication lists, follow up appointments, and when to call the doctor.  Pt/family verbalizes understanding. Patient transported to main entrance for discharge. Thomas Hoff, RN

## 2015-02-16 NOTE — Discharge Instructions (Signed)
Endoscopic Saphenous Vein Harvesting, Care After °Refer to this sheet in the next few weeks. These instructions provide you with information on caring for yourself after your procedure. Your health care provider may also give you more specific instructions. Your treatment has been planned according to current medical practices, but problems sometimes occur. Call your health care provider if you have any problems or questions after your procedure. °HOME CARE INSTRUCTIONS °Medicine °· Take whatever pain medicine your surgeon prescribes. Follow the directions carefully. Do not take over-the-counter pain medicine unless your surgeon says it is okay. Some pain medicine can cause bleeding problems for several weeks after surgery. °· Follow your surgeon's instructions about driving. You will probably not be permitted to drive after heart surgery. °· Take any medicines your surgeon prescribes. Any medicines you took before your heart surgery should be checked with your health care provider before you start taking them again. °Wound care °· If your surgeon has prescribed an elastic bandage or stocking, ask how long you should wear it. °· Check the area around your surgical cuts (incisions) whenever your bandages (dressings) are changed. Look for any redness or swelling. °· You will need to return to have the stitches (sutures) or staples taken out. Ask your surgeon when to do that. °· Ask your surgeon when you can shower or bathe. °Activity °· Try to keep your legs raised when you are sitting. °· Do any exercises your health care providers have given you. These may include deep breathing exercises, coughing, walking, or other exercises. °SEEK MEDICAL CARE IF: °· You have any questions about your medicines. °· You have more leg pain, especially if your pain medicine stops working. °· New or growing bruises develop on your leg. °· Your leg swells, feels tight, or becomes red. °· You have numbness in your leg. °SEEK IMMEDIATE  MEDICAL CARE IF: °· Your pain gets much worse. °· Blood or fluid leaks from any of the incisions. °· Your incisions become warm, swollen, or red. °· You have chest pain. °· You have trouble breathing. °· You have a fever. °· You have more pain near your leg incision. °MAKE SURE YOU: °· Understand these instructions. °· Will watch your condition. °· Will get help right away if you are not doing well or get worse. °  °This information is not intended to replace advice given to you by your health care provider. Make sure you discuss any questions you have with your health care provider. °  °Document Released: 01/07/2011 Document Revised: 05/18/2014 Document Reviewed: 01/07/2011 °Elsevier Interactive Patient Education ©2016 [employer] °Coronary Artery Bypass Grafting, Care After °These instructions give you information on caring for yourself after your procedure. Your doctor may also give you more specific instructions. Call your doctor if you have any problems or questions after your procedure.  °HOME CARE °· Only take medicine as told by your doctor. Take medicines exactly as told. Do not stop taking medicines or start any new medicines without talking to your doctor first. °· Take your pulse as told by your doctor. °· Do deep breathing as told by your doctor. Use your breathing device (incentive spirometer), if given, to practice deep breathing several times a day. Support your chest with a pillow or your arms when you take deep breaths or cough. °· Keep the area clean, dry, and protected where the surgery cuts (incisions) were made. Remove bandages (dressings) only as told by your doctor. If strips were applied to surgical area, do not take   them off. They fall off on their own. °· Check the surgery area daily for puffiness (swelling), redness, or leaking fluid. °· If surgery cuts were made in your legs: °· Avoid crossing your legs. °· Avoid sitting for long periods of time. Change positions every 30  minutes. °· Raise your legs when you are sitting. Place them on pillows. °· Wear stockings that help keep blood clots from forming in your legs (compression stockings). °· Only take sponge baths until your doctor says it is okay to take showers. Pat the surgery area dry. Do not rub the surgery area with a washcloth or towel. Do not bathe, swim, or use a hot tub until your doctor says it is okay. °· Eat foods that are high in fiber. These include raw fruits and vegetables, whole grains, beans, and nuts. Choose lean meats. Avoid canned, processed, and fried foods. °· Drink enough fluids to keep your pee (urine) clear or pale yellow. °· Weigh yourself every day. °· Rest and limit activity as told by your doctor. You may be told to: °· Stop any activity if you have chest pain, shortness of breath, changes in heartbeat, or dizziness. Get help right away if this happens. °· Move around often for short amounts of time or take short walks as told by your doctor. Gradually become more active. You may need help to strengthen your muscles and build endurance. °· Avoid lifting, pushing, or pulling anything heavier than 10 pounds (4.5 kg) for at least 6 weeks after surgery. °· Do not drive until your doctor says it is okay. °· Ask your doctor when you can go back to work. °· Ask your doctor when you can begin sexual activity again. °· Follow up with your doctor as told. °GET HELP IF: °· You have puffiness, redness, more pain, or fluid draining from the incision site. °· You have a fever. °· You have puffiness in your ankles or legs. °· You have pain in your legs. °· You gain 2 or more pounds (0.9 kg) a day. °· You feel sick to your stomach (nauseous) or throw up (vomit). °· You have watery poop (diarrhea). °GET HELP RIGHT AWAY IF: °· You have chest pain that goes to your jaw or arms. °· You have shortness of breath. °· You have a fast or irregular heartbeat. °· You notice a "clicking" in your breastbone when you move. °· You  have numbness or weakness in your arms or legs. °· You feel dizzy or light-headed. °MAKE SURE YOU: °· Understand these instructions. °· Will watch your condition. °· Will get help right away if you are not doing well or get worse. °  °This information is not intended to replace advice given to you by your health care provider. Make sure you discuss any questions you have with your health care provider. °  °Document Released: 05/02/2013 Document Reviewed: 05/02/2013 °Elsevier Interactive Patient Education ©2016 [employer] Inc. ° °

## 2015-02-16 NOTE — Progress Notes (Signed)
Subjective:   Feels good. Eager to go home. SBP back down in 90s this am. Weight stable. 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 amiodarone  200 mg Oral BID  . apixaban  2.5 mg Oral BID  . aspirin EC  81 mg Oral Daily  . atorvastatin  80 mg Oral q1800  . bisacodyl  10 mg Oral Daily   Or  . bisacodyl  10 mg Rectal Daily  . docusate sodium  200 mg Oral Daily  . insulin aspart  0-24 Units Subcutaneous TID AC & HS  . insulin detemir  20 Units Subcutaneous BID  . pantoprazole  40 mg Oral Daily  . potassium chloride  20 mEq Oral Daily  . sodium chloride  10-40 mL Intracatheter Q12H  . sodium chloride  3 mL Intravenous Q12H  . sodium chloride  3 mL Intravenous Q12H    Objective:  Temp:  [97.9 F (36.6 C)-98.4 F (36.9 C)] 98.4 F (36.9 C) (10/08 0519) Pulse Rate:  [93-97] 93 (10/08 0519) Resp:  [18-19] 19 (10/08 0519) BP: (96-134)/(63-86) 96/63 mmHg (10/08 0519) SpO2:  [100 %] 100 % (10/08 0519) Weight:  [74.39 kg (164 lb)] 74.39 kg (164 lb) (10/08 0519) Weight change: -0.11 kg (-3.9 oz)  Intake/Output from previous day: 10/07 0701 - 10/08 0700 In: 480 [P.O.:480] Out: -   Intake/Output from this shift:    Physical Exam: General appearance: awake. conversant Neck: supple JVP flat Lungs: clear Heart: regular rate and rhythm, S1, S2 normal, no murmur, click, rub or gallop Extremities: extremities normal, atraumatic, no cyanosis. LUE PICC.  R and L SCDs.  Abdomen: non tender. Non distended + Bowel Sounds.   Tele: Sinus 90-100  Lab Results: Results for orders placed or performed during the hospital encounter of 02/01/15 (from the past 48 hour(s))  Glucose, capillary     Status: Abnormal   Collection Time: 02/14/15  8:30 AM  Result Value Ref Range   Glucose-Capillary 114 (H) 65 - 99 mg/dL   Comment 1 Notify RN   Glucose, capillary     Status: Abnormal   Collection Time: 02/14/15 12:00 PM  Result Value Ref Range   Glucose-Capillary 245 (H) 65 - 99 mg/dL   Comment 1 Notify RN   Glucose, capillary     Status: Abnormal   Collection Time: 02/14/15  4:57 PM  Result Value Ref Range   Glucose-Capillary 117 (H) 65 - 99 mg/dL   Comment 1 Notify RN   Glucose, capillary     Status: Abnormal   Collection Time: 02/14/15  9:54 PM  Result Value Ref Range   Glucose-Capillary 241 (H) 65 - 99 mg/dL   Comment 1 Capillary Specimen    Comment 2 Notify RN    Comment 3 Document in Chart   CBC     Status: Abnormal   Collection Time: 02/15/15  5:50 AM  Result Value Ref Range   WBC 9.3 4.0 - 10.5 K/uL   RBC 3.39 (L) 3.87 - 5.11 MIL/uL   Hemoglobin 9.7 (L) 12.0 - 15.0 g/dL   HCT 28.7 (L) 36.0 - 46.0 %   MCV 84.7 78.0 - 100.0 fL   MCH 28.6 26.0 - 34.0 pg   MCHC 33.8 30.0 - 36.0 g/dL   RDW 13.9 11.5 - 15.5 %   Platelets 201 150 - 400 K/uL  Basic metabolic panel     Status: Abnormal   Collection Time: 02/15/15  5:50 AM  Result  Value Ref Range   Sodium 131 (L) 135 - 145 mmol/L   Potassium 3.8 3.5 - 5.1 mmol/L   Chloride 91 (L) 101 - 111 mmol/L   CO2 28 22 - 32 mmol/L   Glucose, Bld 150 (H) 65 - 99 mg/dL   BUN 14 6 - 20 mg/dL   Creatinine, Ser 0.93 0.44 - 1.00 mg/dL   Calcium 9.0 8.9 - 10.3 mg/dL   GFR calc non Af Amer >60 >60 mL/min   GFR calc Af Amer >60 >60 mL/min    Comment: (NOTE) The eGFR has been calculated using the CKD EPI equation. This calculation has not been validated in all clinical situations. eGFR's persistently <60 mL/min signify possible Chronic Kidney Disease.    Anion gap 12 5 - 15  Glucose, capillary     Status: Abnormal   Collection Time: 02/15/15  8:03 AM  Result Value Ref Range   Glucose-Capillary 132 (H) 65 - 99 mg/dL   Comment 1 Capillary Specimen    Comment 2 Notify RN   Glucose, capillary     Status: Abnormal   Collection Time: 02/15/15 12:45 PM  Result Value Ref Range   Glucose-Capillary 179 (H) 65 - 99 mg/dL   Comment 1 Capillary Specimen    Comment 2 Notify RN   Glucose, capillary     Status: Abnormal    Collection Time: 02/15/15  6:04 PM  Result Value Ref Range   Glucose-Capillary 197 (H) 65 - 99 mg/dL   Comment 1 Notify RN    Comment 2 Document in Chart   Glucose, capillary     Status: Abnormal   Collection Time: 02/15/15  8:43 PM  Result Value Ref Range   Glucose-Capillary 151 (H) 65 - 99 mg/dL   Comment 1 Notify RN    Comment 2 Document in Chart   Glucose, capillary     Status: Abnormal   Collection Time: 02/16/15  6:33 AM  Result Value Ref Range   Glucose-Capillary 102 (H) 65 - 99 mg/dL    Imaging: Imaging results have been reviewed  Assessment/Plan:   1. Cardiogenic shock 2. Cardiac arrest 3. CAD with NSTEMI    -s/p CABG 9/29 4. Diabetes 5. Ventricular tachycardia  6. 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 BP improved. Volume status looks good. Continue ASA, statin. BP too low to addback low-dose lisinopril back. [place] for d/c from our perspective. CR to see.    HIT+.  Continue low-dose Eliquis for DVT prophylaxis (stop on d/c). Continue low-dose amio for VT can stop as outpatient,.   Bensimhon, Daniel,MD 8:19 AM

## 2015-03-01 ENCOUNTER — Other Ambulatory Visit: Payer: Self-pay | Admitting: Cardiothoracic Surgery

## 2015-03-01 ENCOUNTER — Telehealth: Payer: Self-pay | Admitting: Cardiovascular Disease

## 2015-03-01 ENCOUNTER — Telehealth: Payer: Self-pay

## 2015-03-01 DIAGNOSIS — Z951 Presence of aortocoronary bypass graft: Secondary | ICD-10-CM

## 2015-03-01 NOTE — Telephone Encounter (Signed)
Pt call c/o of vomiting after she eats sometime after taking Amiodarone or Extra Strength Tylenol. Pt stated she had some spaghetti for lunch on Wed. After taking some Tylenol.  Pt stated she she eats breakfast she does not have problems but sometimes after a heavy meal she finds she is nauseous and vomits.  Recommended to stick to the BRAT diet and make sure she eats more than just a craker when taking medications to have food on her stomach.  If she notices that it is getting worse and she should go to [facility] to be evaluated.  Pt verbalized understanding no questions at this time.

## 2015-03-01 NOTE — Telephone Encounter (Signed)
Cardiac Rehab paperwork faxed [phone]

## 2015-03-01 NOTE — Telephone Encounter (Signed)
Pt saud she heart surgery on 02-07-15. She have been throwing up for the last 3 days. Please call,very concerned.t

## 2015-03-02 ENCOUNTER — Emergency Department (INDEPENDENT_AMBULATORY_CARE_PROVIDER_SITE_OTHER)
Admission: EM | Admit: 2015-03-02 | Discharge: 2015-03-02 | Disposition: A | Discharge status: Home / Self Care | Payer: Self-pay | Source: Home / Self Care | Attending: Family Medicine | Admitting: Family Medicine

## 2015-03-02 ENCOUNTER — Encounter (HOSPITAL_COMMUNITY): Payer: Self-pay | Admitting: *Deleted

## 2015-03-02 DIAGNOSIS — R112 Nausea with vomiting, unspecified: Secondary | ICD-10-CM

## 2015-03-02 DIAGNOSIS — T887XXA Unspecified adverse effect of drug or medicament, initial encounter: Secondary | ICD-10-CM

## 2015-03-02 DIAGNOSIS — T50905A Adverse effect of unspecified drugs, medicaments and biological substances, initial encounter: Secondary | ICD-10-CM

## 2015-03-02 MED ORDER — ONDANSETRON HCL 4 MG PO TABS: 4.0000 mg | ORAL_TABLET | Freq: Four times a day (QID) | ORAL | Status: DC

## 2015-03-02 MED ORDER — ONDANSETRON 4 MG PO TBDP
ORAL_TABLET | ORAL | Status: AC
  Filled 2015-03-02: qty 1

## 2015-03-02 MED ORDER — ONDANSETRON 4 MG PO TBDP
4.0000 mg | ORAL_TABLET | Freq: Once | ORAL | Status: AC
  Administered 2015-03-02: 4 mg via ORAL

## 2015-03-02 NOTE — Discharge Instructions (Signed)
Stop oxycodone for pain and use tylenol ext str instead. Call your doctor if further problems.

## 2015-03-02 NOTE — ED Notes (Signed)
Pt  Reports    Symptoms      Of    Vomiting  X  sev  Days   With  Some  Low  abd  Cramping            s/p  Cardiac    Surgery   sev  Days  Ago         Pt  Sitting  Upright  On  The  Exam table  Speaking in  Complete  sentances  Appearing in no  Acute  Distress

## 2015-03-02 NOTE — ED Provider Notes (Signed)
CSN: [id number]     Arrival date & time 03/02/15  1522 History   First MD Initiated Contact with Patient 03/02/15 1608     Chief [id number] presents with  . Emesis   (Consider location/radiation/quality/duration/timing/severity/associated sxs/prior Treatment) Patient is a 44 y.o. female presenting with vomiting. The history is provided by the patient.  Emesis Severity:  Mild Duration:  4 days Number of daily episodes:  One Quality:  Stomach contents Progression:  Unchanged Chronicity:  New Context comment:  Taking oxycodone for sternotomy from cabg 2 weeks ago, taking bid for pain, no blood or diarrhea. Associated symptoms: no abdominal pain and no diarrhea     Past Medical History  Diagnosis Date  . Hypertension   . Diabetes mellitus without complication (HCC)   . MI (myocardial infarction) (HCC)   . CAD (coronary artery disease)     cath 09/07/2014 DES to 95% prox RCA, residual 70-80% origin of AV groove, 90% ostial PDA stenosis  . Hyperlipidemia   . Tobacco abuse   . H/O medication noncompliance   . Carotid artery disease (HCC)     a. 40-59% BICA by duplex 01/2015.   Past Surgical History  Procedure Laterality Date  . Left heart catheterization with coronary angiogram N/A 05/29/2014    Procedure: LEFT HEART CATHETERIZATION WITH CORONARY ANGIOGRAM;  Surgeon: Corky Crafts, MD;  Location: [facility] CATH LAB;  Service: Cardiovascular;  Laterality: N/A;  . Percutaneous coronary stent intervention (pci-s)  05/29/2014    Procedure: PERCUTANEOUS CORONARY STENT INTERVENTION (PCI-S);  Surgeon: Corky Crafts, MD;  Location: [facility] CATH LAB;  Service: Cardiovascular;;  . Abdominal hysterectomy    . Left heart catheterization with coronary angiogram N/A 09/07/2014    Procedure: LEFT HEART CATHETERIZATION WITH CORONARY ANGIOGRAM;  Surgeon: Marykay Lex, MD;  Location: [facility] CATH LAB;  Service: Cardiovascular;  Laterality: N/A;  . Cardiac catheterization N/A 02/01/2015     Procedure: Left Heart Cath and Coronary Angiography;  Surgeon: Runell Gess, MD;  Location[facility] INVASIVE CV LAB;  Service: Cardiovascular;  Laterality: N/A;  . Cardiac catheterization N/A 02/07/2015    Procedure: IABP Insertion;  Surgeon: Dolores Patty, MD;  Location: [facility] INVASIVE CV LAB;  Service: Cardiovascular;  Laterality: N/A;  . Coronary artery bypass graft N/A 02/07/2015    Procedure: CORONARY ARTERY BYPASS GRAFTING (CABG);  Surgeon: Kerin Perna, MD;  Location: [facility] OR;  Service: Open Heart Surgery;  Laterality: N/A;  . Tee without cardioversion N/A 02/07/2015    Procedure: TRANSESOPHAGEAL ECHOCARDIOGRAM (TEE);  Surgeon: Kerin Perna, MD;  Location: [facility] OR;  Service: Open Heart Surgery;  Laterality: N/A;   Family History  Problem Relation Age of Onset  . Hypertension Mother   . Kidney failure Mother   . Heart attack Father   . Heart disease Father    Social History  Substance Use Topics  . Smoking status: Former Smoker -- 0.00 packs/day    Types: Cigarettes  . Smokeless tobacco: Never Used  . Alcohol Use: No   OB History    No data available     Review of Systems  Constitutional: Negative.   HENT: Negative.   Cardiovascular: Negative.   Gastrointestinal: Positive for nausea and vomiting. Negative for abdominal pain, diarrhea, constipation and blood in stool.  Genitourinary: Negative.   All other systems reviewed and are negative.   Allergies  Heparin  Home Medications   Prior to Admission medications   Medication Sig Start Date End Date Taking? Authorizing  Provider  acetaminophen (TYLENOL) 500 MG tablet Take 500 mg by mouth every 6 (six) hours as needed.    Historical Provider, MD  amiodarone (PACERONE) 200 MG tablet Take 1 tablet (200 mg total) by mouth daily. 02/16/15   Rowe ClackWayne E Gold, PA-C  aspirin EC 81 MG tablet Take 81 mg by mouth daily.    Historical Provider, MD  atorvastatin (LIPITOR) 80 MG tablet Take 1 tablet (80 mg total) by mouth daily at 6  PM. Patient taking differently: Take 80 mg by mouth daily.  09/08/14   Azalee CourseHao Meng, PA  insulin glargine (LANTUS) 100 UNIT/ML injection Inject 20 Units into the skin at bedtime.     Historical Provider, MD  metFORMIN (GLUCOPHAGE) 500 MG tablet Take 1 tablet (500 mg total) by mouth 2 (two) times daily with a meal. 06/02/14   Brittainy Sherlynn CarbonM Simmons, PA-C  Omega-3 Fatty Acids (FISH OIL) 1000 MG CAPS Take 1,000 mg by mouth at bedtime.    Historical Provider, MD  ondansetron (ZOFRAN) 4 MG tablet Take 1 tablet (4 mg total) by mouth every 6 (six) hours. Prn n/v. 03/02/15   Linna HoffJames D Kindl, MD   Meds Ordered and Administered this Visit   Medications  ondansetron (ZOFRAN-ODT) disintegrating tablet 4 mg (4 mg Oral Given 03/02/15 1647)    BP 171/83 mmHg  Pulse 98  Temp(Src) 98.2 F (36.8 C) (Oral)  SpO2 100% No data found.   Physical Exam  Constitutional: She is oriented to person, place, and time. She appears well-developed and well-nourished. No distress.  HENT:  Head: Normocephalic.  Mouth/Throat: Oropharynx is clear and moist.  Eyes: Conjunctivae and EOM are normal. Pupils are equal, round, and reactive to light.  Neck: Normal range of motion. Neck supple.  Cardiovascular: Normal rate and normal heart sounds.   Pulmonary/Chest: Effort normal and breath sounds normal.  Abdominal: Soft. Bowel sounds are normal. She exhibits no distension and no mass. There is no tenderness. There is no rebound and no guarding.  Lymphadenopathy:    She has no cervical adenopathy.  Neurological: She is alert and oriented to person, place, and time.  Skin: Skin is warm and dry.  Nursing note and vitals reviewed.   ED Course  Procedures (including critical care time)  Labs Review Labs Reviewed - No data to display  Imaging Review Dg Chest 2 View  03/04/2015  CLINICAL DATA:  Status post CABG 02/07/2015. Anterior and mid chest discomfort today. EXAM: CHEST  2 VIEW COMPARISON:  PA and lateral chest 02/15/2015.  FINDINGS: The patient has a new small left pleural effusion. No right effusion is identified. Mild left basilar atelectasis is seen. The lungs are otherwise clear. No pulmonary edema. The patient is status post CABG with 7 intact, unchanged median sternotomy wires identified. IMPRESSION: Small left pleural effusion and mild basilar atelectasis are new since the comparison examination. The study is otherwise negative. Electronically Signed   By: Drusilla Kannerhomas  Dalessio M.D.   On: 03/04/2015 12:49     Visual Acuity Review  Right Eye Distance:   Left Eye Distance:   Bilateral Distance:    Right Eye Near:   Left Eye Near:    Bilateral Near:         MDM   1. Adverse effects of medication, initial encounter    Given zofran and d/c oxycodone.    Linna HoffJames D Kindl, MD 03/04/15 [phone]

## 2015-03-04 ENCOUNTER — Encounter: Payer: Self-pay | Admitting: Physician Assistant

## 2015-03-04 ENCOUNTER — Ambulatory Visit (INDEPENDENT_AMBULATORY_CARE_PROVIDER_SITE_OTHER): Payer: Self-pay | Admitting: Physician Assistant

## 2015-03-04 ENCOUNTER — Ambulatory Visit (INDEPENDENT_AMBULATORY_CARE_PROVIDER_SITE_OTHER): Payer: Self-pay | Admitting: Surgical

## 2015-03-04 ENCOUNTER — Ambulatory Visit
Admission: RE | Admit: 2015-03-04 | Discharge: 2015-03-04 | Disposition: A | Discharge status: Home / Self Care | Payer: Self-pay | Source: Ambulatory Visit | Attending: Cardiothoracic Surgery | Admitting: Cardiothoracic Surgery

## 2015-03-04 VITALS — BP 140/86 | HR 93 | Ht 63.0 in | Wt 173.6 lb

## 2015-03-04 VITALS — BP 162/98 | HR 101 | Resp 16 | Wt 165.5 lb

## 2015-03-04 DIAGNOSIS — I10 Essential (primary) hypertension: Secondary | ICD-10-CM

## 2015-03-04 DIAGNOSIS — I2583 Coronary atherosclerosis due to lipid rich plaque: Secondary | ICD-10-CM

## 2015-03-04 DIAGNOSIS — I25110 Atherosclerotic heart disease of native coronary artery with unstable angina pectoris: Secondary | ICD-10-CM

## 2015-03-04 DIAGNOSIS — Z951 Presence of aortocoronary bypass graft: Secondary | ICD-10-CM

## 2015-03-04 DIAGNOSIS — I214 Non-ST elevation (NSTEMI) myocardial infarction: Secondary | ICD-10-CM

## 2015-03-04 DIAGNOSIS — I2510 Atherosclerotic heart disease of native coronary artery without angina pectoris: Secondary | ICD-10-CM

## 2015-03-04 DIAGNOSIS — J90 Pleural effusion, not elsewhere classified: Secondary | ICD-10-CM

## 2015-03-04 DIAGNOSIS — J948 Other specified pleural conditions: Secondary | ICD-10-CM

## 2015-03-04 MED ORDER — FUROSEMIDE 40 MG PO TABS: 40.0000 mg | ORAL_TABLET | Freq: Every day | ORAL | Status: DC

## 2015-03-04 MED ORDER — POTASSIUM CHLORIDE CRYS ER 20 MEQ PO TBCR: 10.0000 meq | EXTENDED_RELEASE_TABLET | Freq: Every day | ORAL | Status: DC

## 2015-03-04 MED ORDER — METOPROLOL TARTRATE 25 MG PO TABS: 25.0000 mg | ORAL_TABLET | Freq: Two times a day (BID) | ORAL | Status: DC

## 2015-03-04 NOTE — Patient Instructions (Signed)
The patient was given verbal instructions regarding lifting and activity instructions including driving. She was also given verbal instructions regarding constipation management.

## 2015-03-04 NOTE — Progress Notes (Signed)
Cardiology Office Note   Date:  03/04/2015   ID:  [name], DOB Jan 24, 1971, MRN [id number]  PCP:  Dorrene GermanAVBUERE,EDWIN A, MD  Cardiologist:  Dr William HamburgerBerry  [name], PA-C   CC: Post-hospital f/u, N&V  History of Present Illness: [name] is a 44 y.o. female with a history of CAD, HTN, HL, DM, carotid dz.   She was admitted with a NSTEMI and had CABG, with recurrent PEA/VF arrest req IABP post-op, d/c 02/16/2015.  [name] presents for post-hospital f/u. She was initially doing well, but over the last week, has had problems with N&V.   The N&V started last Thursday, 10/19. It has happened almost every day since then. One day, nausea but no vomiting. She has also had severe constipation, is trying Miralax without much success and has gotten a laxative today, name unclear and has yet to try that.   Her PO intake has been decreased because of both things. She stopped the narcotics several days ago. She has been compliant with all medications, but is vomiting a few hours after taking them.    Past Medical History  Diagnosis Date  . Hypertension   . Diabetes mellitus without complication (HCC)   . MI (myocardial infarction) (HCC)   . CAD (coronary artery disease)     cath 09/07/2014 DES to 95% prox RCA, residual 70-80% origin of AV groove, 90% ostial PDA stenosis  . Hyperlipidemia   . Tobacco abuse   . H/O medication noncompliance   . Carotid artery disease (HCC)     a. 40-59% BICA by duplex 01/2015.   Cath 09/29  Ost RPDA to RPDA lesion, 95% stenosed.  Ost Cx to Prox Cx lesion, 99% stenosed.  Ost LAD to Prox LAD lesion, 70% stenosed.  There is moderate left ventricular systolic dysfunction EF 35-45%  Echo 10/03  - Left ventricle: The cavity size was normal. Wall thickness was increased in a pattern of mild LVH. Systolic function was mildly reduced. The estimated ejection fraction was in the range of 45% to 50%. Doppler parameters are  consistent with abnormal left ventricular relaxation (grade 1 diastolic dysfunction). - Right atrium: The atrium was mildly dilated.  Past Surgical History  Procedure Laterality Date  . Left heart catheterization with coronary angiogram N/A 05/29/2014    Procedure: LEFT HEART CATHETERIZATION WITH CORONARY ANGIOGRAM;  Surgeon: Corky CraftsJayadeep S Varanasi, MD;  [name];  Service: Cardiovascular;  Laterality: N/A;  . Percutaneous coronary stent intervention (pci-s)  05/29/2014    Procedure: PERCUTANEOUS CORONARY STENT INTERVENTION (PCI-S);  Surgeon: Corky CraftsJayadeep S Varanasi, MD;  [facility];  Service: Cardiovascular;;  . Abdominal hysterectomy    . Left heart catheterization with coronary angiogram N/A 09/07/2014    Procedure: LEFT HEART CATHETERIZATION WITH CORONARY ANGIOGRAM;  Surgeon: Marykay Lexavid W Harding, MD;  [facility];  Service: Cardiovascular;  Laterality: N/A;  . Cardiac catheterization N/A 02/01/2015    Procedure: Left Heart Cath and Coronary Angiography;  Surgeon: Runell GessJonathan J Berry, MD;  [facility];  Service: Cardiovascular;  Laterality: N/A;  . Cardiac catheterization N/A 02/07/2015    Procedure: IABP Insertion;  Surgeon: Dolores Pattyaniel R Bensimhon, MD;  Location: [facility] INVASIVE CV LAB;  Service: Cardiovascular;  Laterality: N/A;  . Coronary artery bypass graft N/A 02/07/2015    Procedure: CORONARY ARTERY BYPASS GRAFTING (CABG);  Surgeon: Kerin PernaPeter Van Trigt, MD; LIMA-LAD, SVT-OM1, SVG-PDA   . Tee without cardioversion N/A 02/07/2015    Procedure:  TRANSESOPHAGEAL ECHOCARDIOGRAM (TEE);  Surgeon: Kerin Perna, MD;  Location: [facility] OR;  Service: Open Heart Surgery;  Laterality: N/A;    Current Outpatient Prescriptions  Medication Sig Dispense Refill  . acetaminophen (TYLENOL) 500 MG tablet Take 500 mg by mouth every 6 (six) hours as needed.    Marland Kitchen amiodarone (PACERONE) 200 MG tablet Take 1 tablet (200 mg total) by mouth daily. 30 tablet 1  . aspirin EC 81 MG tablet Take  81 mg by mouth daily.    Marland Kitchen atorvastatin (LIPITOR) 80 MG tablet Take 1 tablet (80 mg total) by mouth daily at 6 PM. (Patient taking differently: Take 80 mg by mouth daily. ) 30 tablet 5  . furosemide (LASIX) 40 MG tablet Take 1 tablet (40 mg total) by mouth daily. 14 tablet 0  . insulin glargine (LANTUS) 100 UNIT/ML injection Inject 20 Units into the skin at bedtime.     . metFORMIN (GLUCOPHAGE) 500 MG tablet Take 1 tablet (500 mg total) by mouth 2 (two) times daily with a meal. 60 tablet 0  . Omega-3 Fatty Acids (FISH OIL) 1000 MG CAPS Take 1,000 mg by mouth at bedtime.    . ondansetron (ZOFRAN) 4 MG tablet Take 1 tablet (4 mg total) by mouth every 6 (six) hours. Prn n/v. 12 tablet 0  . potassium chloride (K-DUR,KLOR-CON) 20 MEQ tablet Take 0.5 tablets (10 mEq total) by mouth daily. 14 tablet 0   No current facility-administered medications for this visit.    Allergies:   Heparin    Social History:  The patient  reports that she has quit smoking. Her smoking use included Cigarettes. She smoked 0.00 packs per day. She has never used smokeless tobacco. She reports that she does not drink alcohol or use illicit drugs.   Family History:  The patient's family history includes Heart attack in her father; Heart disease in her father; Hypertension in her mother; Kidney failure in her mother.    ROS:  Please see the history of present illness. All other systems are reviewed and negative.    PHYSICAL EXAM: VS:  BP 140/86 mmHg  Pulse 93  Ht  (1.6 m)  Wt 173 lb 9 oz (78.727 kg)  BMI 30.75 kg/m2 , BMI Body mass index is 30.75 kg/(m^2). GEN: Well nourished, well developed, female in no acute distress HEENT: normal for age  Neck: no JVD, no carotid bruit, no masses Cardiac: RRR; no murmur, no rubs, or gallops Respiratory:  clear to auscultation bilaterally, normal work of breathing GI: soft, nontender, nondistended, + BS MS: no deformity or atrophy; no edema; distal pulses are 2+ in all 4  extremities  Skin: warm and dry, no rash Neuro:  Strength and sensation are intact Psych: euthymic mood, full affect   EKG:  EKG is ordered today. The ekg ordered today demonstrates SR with anterior and lateral ST/T changes   Recent Labs: 02/08/2015: Magnesium 1.9 02/12/2015: ALT 127* 02/15/2015: BUN 14; Creatinine, Ser 0.93; Hemoglobin 9.7*; Platelets 201; Potassium 3.8; Sodium 131*    Lipid Panel    Component Value Date/Time   CHOL 96 02/03/2015 0235   TRIG 88 02/03/2015 0235   HDL 32* 02/03/2015 0235   CHOLHDL 3.0 02/03/2015 0235   VLDL 18 02/03/2015 0235   LDLCALC 46 02/03/2015 0235     Wt Readings from Last 3 Encounters:  03/04/15 173 lb 9 oz (78.727 kg)  03/04/15 165 lb 8 oz (75.07 kg)  02/16/15 164 lb (74.39 kg)  Other studies Reviewed: Additional studies/ records that were reviewed today include: Hospital records, ECG.  ASSESSMENT AND PLAN:  1.  N&V: She was started on amio after her VF arrest. Her last EF was 45-50% after d/c. The most likely medication to be causing the symptoms is the amio. We will stop it. Will let Dr Allyson Sabal know, to see if he wishes to consider another anti-arrhythmic or just use a BB. Restart her previous dose of metoprolol.  Encourage rx of her constipation which may be contributing to symptoms. She can take Miralax bid and can get a stool softener as well. She understands that straining is not desirable.    Because of the N&V as well as the VF, we will check a BMET today.  Current medicines are reviewed at length with the patient today.  The patient does not have concerns regarding medicines.  The following changes have been made:  D/c amio, add BB  Labs/ tests ordered today include:  Orders Placed This Encounter  Procedures  . Basic metabolic panel  . EKG 12-Lead     Disposition:   2-week f/u with me.  Tawny Asal  03/04/2015 5:54 PM    [facility] [address] [place], [place], [place]   [address] Phone: [phone]; Fax: [phone]

## 2015-03-04 NOTE — Progress Notes (Signed)
[address]       [place] [address]             [phone]                  CARLITA WHITCOMB [facility] Medical Record #[id number] Date of Birth: 1970-08-22  Referring QM:VHQIO, Delton See, MD Primary Cardiology: Primary Care:AVBUERE,EDWIN A, MD  Chief Complaint:  Follow Up Visit DATE OF PROCEDURE: 02/07/2015 DATE OF DISCHARGE:   OPERATIVE REPORT   OPERATION: 1. Coronary artery bypass grafting x3 (left internal mammary artery to  left anterior descending, saphenous vein graft to posterior  descending, saphenous vein graft to obtuse marginal 1). 2. Endoscopic harvest of left leg greater saphenous vein, exposure,  but not harvest of saphenous vein in right leg due to small size  and inadequate for conduit use.  PREOPERATIVE DIAGNOSES: Unstable angina, non-ST-elevation myocardial infarction.  POSTOPERATIVE DIAGNOSES: Unstable angina, non-ST-elevation myocardial infarction.  SURGEON: Kerin Perna, M.D.  ASSISTANT: Lowella Dandy, PA-C.  ANESTHESIA: General by Dr. Autumn Patty. History of Present Illness:      The patient is a 44 year old black female seen for follow-up in the office status post above procedure. Primary complaint at this time is constipation. She sought care at the urgent care and they stopped her narcotics. She has been taken Miralax without significant bowel movement. She ambulates throughout the house all day long but only walks for exercise once daily. She is otherwise doing well without shortness of breath, fevers, chills or other constitutional symptoms. She has some mild sternal discomfort. She has no angina pain.       Zubrod Score: At the time of surgery this patient's most appropriate activity status/level should be described as:     0    Normal activity, no symptoms     1    Restricted in physical strenuous activity but ambulatory, able to do out light  work     2    Ambulatory and capable of self care, unable to do work activities, up and about                 >50 % of waking hours                                                                                       3    Only limited self care, in bed greater than 50% of waking hours     4    Completely disabled, no self care, confined to bed or chair     5    Moribund  History  Smoking status  . Former Smoker -- 0.00 packs/day  . Types: Cigarettes  Smokeless tobacco  . Never Used       Allergies  Allergen Reactions  . Heparin Other (See Comments)    SRA and HIT Ab Positive - Patient should NOT receive heparin    Current Outpatient Prescriptions  Medication Sig Dispense Refill  . acetaminophen (TYLENOL) 500 MG tablet Take 500 mg by mouth every 6 (six) hours as needed.    Marland Kitchen amiodarone (PACERONE) 200 MG tablet  Take 1 tablet (200 mg total) by mouth daily. 30 tablet 1  . aspirin EC 81 MG tablet Take 81 mg by mouth daily.    Marland Kitchen. atorvastatin (LIPITOR) 80 MG tablet Take 1 tablet (80 mg total) by mouth daily at 6 PM. (Patient taking differently: Take 80 mg by mouth daily. ) 30 tablet 5  . insulin glargine (LANTUS) 100 UNIT/ML injection Inject 20 Units into the skin at bedtime.     . metFORMIN (GLUCOPHAGE) 500 MG tablet Take 1 tablet (500 mg total) by mouth 2 (two) times daily with a meal. 60 tablet 0  . Omega-3 Fatty Acids (FISH OIL) 1000 MG CAPS Take 1,000 mg by mouth at bedtime.    . ondansetron (ZOFRAN) 4 MG tablet Take 1 tablet (4 mg total) by mouth every 6 (six) hours. Prn n/v. 12 tablet 0  . furosemide (LASIX) 40 MG tablet Take 1 tablet (40 mg total) by mouth daily. 14 tablet 0  . potassium chloride (K-DUR,KLOR-CON) 20 MEQ tablet Take 0.5 tablets (10 mEq total) by mouth daily. 14 tablet 0   No current facility-administered medications for this visit.       Physical Exam: BP 162/98 mmHg  Pulse 101  Resp 16  Wt 165 lb 8 oz (75.07 kg)  SpO2 99%  General appearance:  alert, cooperative and no distress Heart: regular rate and rhythm Lungs: Mildly diminished in the left base Abdomen: Soft, minor diffuse tenderness without guarding or rebound. Minor distention. Extremities: Positive bilateral lower extremity edema. Wound: Incisions healing without evidence of infection   Diagnostic Studies & Laboratory data:         Recent Radiology Findings: Dg Chest 2 View  03/04/2015  CLINICAL DATA:  Status post CABG 02/07/2015. Anterior and mid chest discomfort today. EXAM: CHEST  2 VIEW COMPARISON:  PA and lateral chest 02/15/2015. FINDINGS: The patient has a new small left pleural effusion. No right effusion is identified. Mild left basilar atelectasis is seen. The lungs are otherwise clear. No pulmonary edema. The patient is status post CABG with 7 intact, unchanged median sternotomy wires identified. IMPRESSION: Small left pleural effusion and mild basilar atelectasis are new since the comparison examination. The study is otherwise negative. Electronically Signed   By: Drusilla Kannerhomas  Dalessio M.D.   On: 03/04/2015 12:49      I have independently reviewed the above radiology findings and reviewed findings  with the patient.  Recent Labs: Lab Results  Component Value Date   WBC 9.3 02/15/2015   HGB 9.7* 02/15/2015   HCT 28.7* 02/15/2015   PLT 201 02/15/2015   GLUCOSE 150* 02/15/2015   CHOL 96 02/03/2015   TRIG 88 02/03/2015   HDL 32* 02/03/2015   LDLCALC 46 02/03/2015   ALT 127* 02/12/2015   AST 48* 02/12/2015   NA 131* 02/15/2015   K 3.8 02/15/2015   CL 91* 02/15/2015   CREATININE 0.93 02/15/2015   BUN 14 02/15/2015   CO2 28 02/15/2015   TSH 2.674 10/28/2009   INR 1.60* 02/07/2015   HGBA1C 11.7* 02/03/2015      Assessment / Plan:  Patient is overall doing well. I have given her some instructions regarding her constipation with both oral regimen and if these are unsuccessful she can also try suppository/enema. She does have some volume overload with pedal  edema and a small left effusion. I gave her a prescription for Lasix and potassium for 2 weeks. We will see her in 2 weeks with a repeat chest x-ray to  make sure the effusion is not increased and she has better control on fluid retention. She is also scheduled to see cardiology later today.         Suhas Estis E 03/04/2015 1:20 PM

## 2015-03-04 NOTE — Patient Instructions (Signed)
Stop Amiodarone   Start Metoprolol 25 mg twice a day   Lab Work Today ( bmet )   Your physician recommends that you schedule a follow-up appointment Tue 03/19/15 at 1:30 pm with Theodore [facility]onda [facility]

## 2015-03-05 LAB — BASIC METABOLIC PANEL
BUN: 10 mg/dL (ref 7–25)
CALCIUM: 8.9 mg/dL (ref 8.6–10.2)
CO2: 21 mmol/L (ref 20–31)
CREATININE: 0.73 mg/dL (ref 0.50–1.10)
Chloride: 102 mmol/L (ref 98–110)
Glucose, Bld: 143 mg/dL — ABNORMAL HIGH (ref 65–99)
Potassium: 4.1 mmol/L (ref 3.5–5.3)
SODIUM: 136 mmol/L (ref 135–146)

## 2015-03-06 ENCOUNTER — Encounter: Payer: Self-pay | Admitting: Cardiothoracic Surgery

## 2015-03-07 ENCOUNTER — Telehealth (HOSPITAL_COMMUNITY): Payer: Self-pay | Admitting: *Deleted

## 2015-03-18 ENCOUNTER — Other Ambulatory Visit: Payer: Self-pay | Admitting: Cardiothoracic Surgery

## 2015-03-18 DIAGNOSIS — Z951 Presence of aortocoronary bypass graft: Secondary | ICD-10-CM

## 2015-03-19 ENCOUNTER — Encounter: Payer: Self-pay | Admitting: Physician Assistant

## 2015-03-19 ENCOUNTER — Ambulatory Visit (INDEPENDENT_AMBULATORY_CARE_PROVIDER_SITE_OTHER): Payer: Self-pay | Admitting: Physician Assistant

## 2015-03-19 VITALS — BP 152/80 | HR 66 | Ht 63.0 in | Wt 176.2 lb

## 2015-03-19 DIAGNOSIS — Z79899 Other long term (current) drug therapy: Secondary | ICD-10-CM

## 2015-03-19 DIAGNOSIS — E785 Hyperlipidemia, unspecified: Secondary | ICD-10-CM

## 2015-03-19 DIAGNOSIS — Z951 Presence of aortocoronary bypass graft: Secondary | ICD-10-CM

## 2015-03-19 LAB — BASIC METABOLIC PANEL
BUN: 12 mg/dL (ref 7–25)
CO2: 26 mmol/L (ref 20–31)
CREATININE: 0.69 mg/dL (ref 0.50–1.10)
Calcium: 9.6 mg/dL (ref 8.6–10.2)
Chloride: 100 mmol/L (ref 98–110)
GLUCOSE: 170 mg/dL — AB (ref 65–99)
Potassium: 4.3 mmol/L (ref 3.5–5.3)
Sodium: 138 mmol/L (ref 135–146)

## 2015-03-19 MED ORDER — METOPROLOL TARTRATE 50 MG PO TABS: 50.0000 mg | ORAL_TABLET | Freq: Two times a day (BID) | ORAL | Status: DC

## 2015-03-19 MED ORDER — FUROSEMIDE 40 MG PO TABS: 40.0000 mg | ORAL_TABLET | Freq: Every day | ORAL | Status: DC

## 2015-03-19 MED ORDER — POTASSIUM CHLORIDE CRYS ER 20 MEQ PO TBCR: 10.0000 meq | EXTENDED_RELEASE_TABLET | Freq: Every day | ORAL | Status: DC

## 2015-03-19 MED ORDER — LOVASTATIN 20 MG PO TABS: 20.0000 mg | ORAL_TABLET | Freq: Every day | ORAL | Status: DC

## 2015-03-19 NOTE — Progress Notes (Signed)
Cardiology Office Note   Date:  03/19/2015   ID:  [name], DOB April 12, 1971, MRN [id number]  PCP:  Dorrene German, MD  Cardiologist:   Dr William Hamburger, PA-C   Chief Complaint  Patient presents with  . Follow-up    pt c/o no chest pain no swelling in legs, no SOB no dizziness    History of Present Illness: [name] is a 44 y.o. female with a history of MI w/ stent RCA 08/2014, HTN, DM, HL, Tobacco use. Admitted 09/23-10/08 with NSTEMI>>CABG x 3, EF now 45%, HIT positive; PEA arrest w/ VT, CGS>>IABP for 48 hours or so.   [name] presents for post hospital follow-up   since discharge from the hospital, she has done very well. She is compliant with her medications although she is having trouble affording them. She has been taking her diuretics as directed. She is increasing her activity at home in looking forward to getting back to work. 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   When she goes back to work, she will have money for her medications.  Past Medical History  Diagnosis Date  . Hypertension   . Diabetes mellitus without complication (HCC)   . MI (myocardial infarction) (HCC)   . CAD (coronary artery disease)     cath 09/07/2014 DES to 95% prox RCA, residual 70-80% origin of AV groove, 90% ostial PDA stenosis; 01/2015, NSTEMI w/ CABG, LIMA-LAD, SVG-OM1, SVG-PCA  . Hyperlipidemia   . Tobacco abuse   . H/O medication noncompliance   . Carotid artery disease (HCC)     a. 40-59% BICA by duplex 01/2015.   Past Surgical History  Procedure Laterality Date  . Left heart catheterization with coronary angiogram N/A 05/29/2014    Procedure: LEFT HEART CATHETERIZATION WITH CORONARY ANGIOGRAM;  Surgeon: Corky Crafts, MD;  Location: [facility] CATH LAB;  Service: Cardiovascular;  Laterality: N/A;  . Percutaneous coronary stent intervention (pci-s)  05/29/2014   Procedure: PERCUTANEOUS CORONARY STENT INTERVENTION (PCI-S);  Surgeon: Corky Crafts, MD;  Location: [facility] CATH LAB;  Service: Cardiovascular;;  . Abdominal hysterectomy    . Left heart catheterization with coronary angiogram N/A 09/07/2014    Procedure: LEFT HEART CATHETERIZATION WITH CORONARY ANGIOGRAM;  Surgeon: Marykay Lex, MD;  Location: [facility] CATH LAB;  Service: Cardiovascular;  Laterality: N/A;  . Cardiac catheterization N/A 02/01/2015    Procedure: Left Heart Cath and Coronary Angiography;  Surgeon: Runell Gess, MD;  Location: [facility] INVASIVE CV LAB;  Service: Cardiovascular;  Laterality: N/A;  . Cardiac catheterization N/A 02/07/2015    Procedure: IABP Insertion;  Surgeon: Dolores Patty, MD;  Location: [facility] INVASIVE CV LAB;  Service: Cardiovascular;  Laterality: N/A;  . Coronary artery bypass graft N/A 02/07/2015    Procedure: CORONARY ARTERY BYPASS GRAFTING (CABG);  Surgeon: Kerin Perna, MD; LIMA-LAD, SVT-OM1, SVG-PDA  . Tee without cardioversion N/A 02/07/2015    Procedure: TRANSESOPHAGEAL ECHOCARDIOGRAM (TEE);  Surgeon: Kerin Perna, MD;  Location: [facility] OR;  Service: Open Heart Surgery;  Laterality: N/A;    Current Outpatient Prescriptions  Medication Sig Dispense Refill  . acetaminophen (TYLENOL) 500 MG tablet Take 500 mg by mouth every 6 (six) hours as needed.    Marland Kitchen aspirin EC 81 MG tablet Take 81 mg by mouth daily.    . furosemide (LASIX) 40 MG tablet  Take 1 tablet (40 mg total) by mouth daily. 30 tablet 11  . insulin glargine (LANTUS) 100 UNIT/ML injection Inject 20 Units into the skin at bedtime.     . metFORMIN (GLUCOPHAGE) 500 MG tablet Take 1 tablet (500 mg total) by mouth 2 (two) times daily with a meal. 60 tablet 0  . metoprolol tartrate (LOPRESSOR) 50 MG tablet Take 1 tablet (50 mg total) by mouth 2 (two) times daily. 60 tablet 11  . Omega-3 Fatty Acids (FISH OIL) 1000 MG CAPS Take 1,000 mg by mouth at bedtime.    . ondansetron (ZOFRAN) 4 MG tablet Take 1 tablet  (4 mg total) by mouth every 6 (six) hours. Prn n/v. 12 tablet 0  . potassium chloride SA (K-DUR,KLOR-CON) 20 MEQ tablet Take 0.5 tablets (10 mEq total) by mouth daily. 30 tablet 11  . lovastatin (MEVACOR) 20 MG tablet Take 1 tablet (20 mg total) by mouth at bedtime. 30 tablet 11   No current facility-administered medications for this visit.    Allergies:   Heparin    Social History:  The patient  reports that she has quit smoking. Her smoking use included Cigarettes. She smoked 0.00 packs per day. She has never used smokeless tobacco. She reports that she does not drink alcohol or use illicit drugs.   Family History:  The patient's family history includes Heart attack in her father; Heart disease in her father; Hypertension in her mother; Kidney failure in her mother.    ROS:  Please see the history of present illness. All other systems are reviewed and negative.    PHYSICAL EXAM: VS:  BP 152/80 mmHg  Pulse 66  Ht  (1.6 m)  Wt 176 lb 3.2 oz (79.924 kg)  BMI 31.22 kg/m2 , BMI Body mass index is 31.22 kg/(m^2). GEN: Well nourished, well developed, female in no acute distress HEENT: normal for age  Neck: no JVD, no carotid bruit, no masses Cardiac: RRR; no murmur, no rubs, or gallops Respiratory:  clear to auscultation bilaterally, normal work of breathing GI: soft, nontender, nondistended, + BS MS: no deformity or atrophy; no edema; distal pulses are 2+ in all 4 extremities  Skin: warm and dry, no rash Neuro:  Strength and sensation are intact Psych: euthymic mood, full affect   EKG:  EKG is ordered today. The ekg ordered today demonstrates   Sinus rhythm, rate 69, intervals within normal limits  Recent Labs: 02/08/2015: Magnesium 1.9 02/12/2015: ALT 127* 02/15/2015: Hemoglobin 9.7*; Platelets 201 03/04/2015: BUN 10; Creat 0.73; Potassium 4.1; Sodium 136    Lipid Panel    Component Value Date/Time   CHOL 96 02/03/2015 0235   TRIG 88 02/03/2015 0235   HDL 32*  02/03/2015 0235   CHOLHDL 3.0 02/03/2015 0235   VLDL 18 02/03/2015 0235   LDLCALC 46 02/03/2015 0235     Wt Readings from Last 3 Encounters:  03/19/15 176 lb 3.2 oz (79.924 kg)  03/04/15 173 lb 9 oz (78.727 kg)  03/04/15 165 lb 8 oz (75.07 kg)     Other studies Reviewed: Additional studies/ records that were reviewed today include:  Hospital records, cath and surgical reports.  ASSESSMENT AND PLAN:  1.   Non-STEMI with four-vessel bypass: she is doing well since discharge from the hospital. Her blood pressure is still elevated but her volume status is good. We will check a BMET today and continue her Lasix and potassium at current doses if she is tolerating these well. Her blood pressure still  elevated so we will increase her beta blocker.    from a cardiac standpoint, she can return to work as long as she does not lift over 15 pounds for the next 6 weeks.   2. Dyslipidemia: her LDL was not extremely high. She is not able to afford the Lipitor will run out in a couple of days. She says she can't afford a medicine that is [facility]4 at [employer]. We will therefore change her to  Lovastatin 20 mg and recheck her liver functions an lipid profile in 6 weeks  Current medicines are reviewed at length with the patient today.  The patient does not have concerns regarding medicines.  The following changes have been made:   Change Lipitor to Mevacor,  Increase metoprolol  Labs/ tests ordered today include:  Orders Placed This Encounter  Procedures  . Lipid panel  . Hepatic function panel  . Basic metabolic panel  . EKG 12-Lead    Disposition:   FU with Dr Allyson SabalBerry  Signed, Leanna BattlesBarrett, Bronsyn Shappell, PA-C  03/19/2015 5:17 PM    [facility] [address], [place], [place]  [address] Phone: [phone]; Fax: [phone]

## 2015-03-19 NOTE — Patient Instructions (Signed)
Medication Instructions:  STOP Atorvastatin START Lovastatin 20 mg - take 1 tablet by mouth at bedtime. INCREASE Metoprolol Tartrate to 50 mg. You may take 2 tablets by mouth twice daily of your current prescription until it runs out.  >>New prescriptions have been sent to your pharmacy electronically  Labwork: Your physician recommends that you return for lab work TODAY and in 6 WEEKS. You will need to be FASTING for blood work in 6 weeks.  Testing/Procedures: NONE  Follow-Up: [name] recommends that you schedule a follow-up appointment in 6 weeks, after your bloodwork.  If you need a refill on your cardiac medications before your next appointment, please call your pharmacy.

## 2015-03-20 ENCOUNTER — Encounter: Payer: Self-pay | Admitting: Cardiothoracic Surgery

## 2015-03-20 ENCOUNTER — Ambulatory Visit
Admission: RE | Admit: 2015-03-20 | Discharge: 2015-03-20 | Disposition: A | Discharge status: Home / Self Care | Payer: No Typology Code available for payment source | Source: Ambulatory Visit | Attending: Cardiothoracic Surgery | Admitting: Cardiothoracic Surgery

## 2015-03-20 DIAGNOSIS — Z951 Presence of aortocoronary bypass graft: Secondary | ICD-10-CM

## 2015-03-25 ENCOUNTER — Ambulatory Visit (INDEPENDENT_AMBULATORY_CARE_PROVIDER_SITE_OTHER): Payer: Self-pay | Admitting: Surgical

## 2015-03-25 VITALS — BP 185/90 | HR 61 | Resp 16 | Ht 63.0 in | Wt 175.0 lb

## 2015-03-25 DIAGNOSIS — J948 Other specified pleural conditions: Secondary | ICD-10-CM

## 2015-03-25 DIAGNOSIS — I25110 Atherosclerotic heart disease of native coronary artery with unstable angina pectoris: Secondary | ICD-10-CM

## 2015-03-25 DIAGNOSIS — J90 Pleural effusion, not elsewhere classified: Secondary | ICD-10-CM

## 2015-03-25 DIAGNOSIS — Z951 Presence of aortocoronary bypass graft: Secondary | ICD-10-CM

## 2015-03-25 NOTE — Patient Instructions (Signed)
Patient is given verbal instructions regarding monitoring her blood pressure and reporting to the cardiologist elevated readings.

## 2015-03-25 NOTE — Progress Notes (Signed)
[address]       [place] [id number]             [phone]                  Samule DryKimberly L Luffman [facility] Medical Record #[id number] Date of Birth: 1971-03-24  Referring OZ:HYQMVD:Berry, Delton SeeJonathan J, MD Primary Cardiology: Primary Care:AVBUERE,EDWIN A, MD  Chief Complaint:  Follow Up Visit DATE OF PROCEDURE: 02/07/2015 DATE OF DISCHARGE:   OPERATIVE REPORT   OPERATION: 1. Coronary artery bypass grafting x3 (left internal mammary artery to  left anterior descending, saphenous vein graft to posterior  descending, saphenous vein graft to obtuse marginal 1). 2. Endoscopic harvest of left leg greater saphenous vein, exposure,  but not harvest of saphenous vein in right leg due to small size  and inadequate for conduit use.  PREOPERATIVE DIAGNOSES: Unstable angina, non-ST-elevation myocardial infarction.  POSTOPERATIVE DIAGNOSES: Unstable angina, non-ST-elevation myocardial infarction.  SURGEON: Kerin PernaPeter Van Trigt, M.D.  ASSISTANT: Lowella DandyErin Barrett, PA-C.  ANESTHESIA: General by Dr. Autumn PattyEdmond Fitzgerald History of Present Illness:      The patient is a 44 year old female status post a above described procedure. She is seen in office follow-up on today's date. We previously saw her on 03/04/2015 at which time she had a small left effusion was placed on some oral Lasix and potassium for 2 weeks. A chest x-ray done on 03/19/2015 showed resolution of this effusion. She is overall feeling well. She started back to work on today's date. She is noted to be hypertensive but also states that her metoprolol dose was increased S today from 25-50 mg twice a day. She does check her blood pressure at home on a routine basis. He has not been as high as it is in the office on today's date.       Zubrod Score: At the time of surgery this patient's most appropriate activity status/level should be described as: []     0    Normal  activity, no symptoms []     1    Restricted in physical strenuous activity but ambulatory, able to do out light work []     2    Ambulatory and capable of self care, unable to do work activities, up and about                 >50 % of waking hours                                                                                   []     3    Only limited self care, in bed greater than 50% of waking hours []     4    Completely disabled, no self care, confined to bed or chair []     5    Moribund  History  Smoking status  . Former Smoker -- 0.00 packs/day  . Types: Cigarettes  Smokeless tobacco  . Never Used       Allergies  Allergen Reactions  . Heparin Other (See Comments)    SRA and HIT Ab Positive - Patient should NOT receive heparin  Current Outpatient Prescriptions  Medication Sig Dispense Refill  . acetaminophen (TYLENOL) 500 MG tablet Take 500 mg by mouth every 6 (six) hours as needed.    Marland Kitchen aspirin EC 81 MG tablet Take 81 mg by mouth daily.    . insulin glargine (LANTUS) 100 UNIT/ML injection Inject 20 Units into the skin at bedtime.     . lovastatin (MEVACOR) 20 MG tablet Take 1 tablet (20 mg total) by mouth at bedtime. 30 tablet 11  . metFORMIN (GLUCOPHAGE) 500 MG tablet Take 1 tablet (500 mg total) by mouth 2 (two) times daily with a meal. 60 tablet 0  . metoprolol tartrate (LOPRESSOR) 50 MG tablet Take 1 tablet (50 mg total) by mouth 2 (two) times daily. 60 tablet 11  . Omega-3 Fatty Acids (FISH OIL) 1000 MG CAPS Take 1,000 mg by mouth at bedtime.    . ondansetron (ZOFRAN) 4 MG tablet Take 1 tablet (4 mg total) by mouth every 6 (six) hours. Prn n/v. 12 tablet 0   No current facility-administered medications for this visit.       Physical Exam: BP 185/90 mmHg  Pulse 61  Resp 16  Ht  (1.6 m)  Wt 175 lb (79.379 kg)  BMI 31.01 kg/m2  SpO2 99%  General appearance: alert, cooperative and no distress Heart: regular rate and rhythm Lungs: clear to auscultation  bilaterally Abdomen: soft, non-tender; bowel sounds normal; no masses,  no organomegaly Extremities: extremities normal, atraumatic, no cyanosis or edema Wounds: Well-healed  Diagnostic Studies & Laboratory data:         Recent Radiology Findings: No results found.    I have independently reviewed the above radiology findings and reviewed findings  with the patient.  Recent Labs: Lab Results  Component Value Date   WBC 9.3 02/15/2015   HGB 9.7* 02/15/2015   HCT 28.7* 02/15/2015   PLT 201 02/15/2015   GLUCOSE 170* 03/19/2015   CHOL 96 02/03/2015   TRIG 88 02/03/2015   HDL 32* 02/03/2015   LDLCALC 46 02/03/2015   ALT 127* 02/12/2015   AST 48* 02/12/2015   NA 138 03/19/2015   K 4.3 03/19/2015   CL 100 03/19/2015   CREATININE 0.69 03/19/2015   BUN 12 03/19/2015   CO2 26 03/19/2015   TSH 2.674 10/28/2009   INR 1.60* 02/07/2015   HGBA1C 11.7* 02/03/2015      Assessment / Plan:  The patient is overall doing well. She has been seen on 2 occasions since surgery by cardiology and a been managing her medications. Due to her elevated blood pressure on today's date and instructed her to follow up with them should her blood pressures continue to be elevated and I gave her parameters. She is going to check her blood pressure at least twice daily. There are no current surgically related issues and we will follow-up with her on a when necessary basis or as requested.        GOLD,WAYNE E 03/25/2015 3:31 PM

## 2015-03-27 NOTE — Discharge Summary (Signed)
      [address]       [place] [id number]             [phone]     Addendum to discharge summary for hospitalization dated 02/01/2015 to 02/16/2015:   At the time of discharge the patient was not felt to be a candidate for beta blocker or ACE  inhibitor due to low relative blood pressure at the time.    Kesler Wickham E, PA-C

## 2015-04-11 ENCOUNTER — Telehealth: Payer: Self-pay | Admitting: Cardiovascular Disease

## 2015-04-11 ENCOUNTER — Other Ambulatory Visit: Payer: Self-pay | Admitting: Physician Assistant

## 2015-04-11 DIAGNOSIS — I472 Ventricular tachycardia: Secondary | ICD-10-CM

## 2015-04-11 DIAGNOSIS — I4729 Other ventricular tachycardia: Secondary | ICD-10-CM

## 2015-04-11 NOTE — Telephone Encounter (Signed)
[name] is calling about bp . Its running high .Marland Kitchen. Please call   Thanks

## 2015-04-11 NOTE — Telephone Encounter (Signed)
Pt states she is taking meds as listed. She states she was instructed by cardiothoracic surgeon to discontinue her lisinopril, but notes BPs have been consistently 150-170 systolic when checked. She notes this has been the case since time of discharge. Was instructed by them to contact cardiology for advice.  Pt wanted recommendation on whether OK to resume BP medication. She is not having any symptoms w/ higher BP but wants to manage it.   Routed to Dr. Allyson SabalBerry to see if OK to resume lisinopril 10mg  daily, if not, other recommendations.

## 2015-04-12 NOTE — Telephone Encounter (Signed)
Pt acknowledged instructions. She elects to f/u w/ Theodore Demarkhonda Barrett for appt previously scheduled.

## 2015-04-12 NOTE — Telephone Encounter (Signed)
Patient still does not know if she needs to start her blood pressure medication again or not.  Please call,

## 2015-04-12 NOTE — Telephone Encounter (Signed)
LM on verified VM. Acknowledged that pt has appt w/ Theodore Demarkhonda Barrett in 2 weeks, gave pt option of scheduling for BP visit w/ Belenda CruiseKristin. Asked pt to call back.

## 2015-04-12 NOTE — Telephone Encounter (Signed)
Please have her see Belenda CruiseKristin back in for blood pressure eval and medication adjustment

## 2015-04-16 ENCOUNTER — Encounter (INDEPENDENT_AMBULATORY_CARE_PROVIDER_SITE_OTHER): Payer: Self-pay

## 2015-04-16 DIAGNOSIS — R0602 Shortness of breath: Secondary | ICD-10-CM

## 2015-05-02 ENCOUNTER — Ambulatory Visit (INDEPENDENT_AMBULATORY_CARE_PROVIDER_SITE_OTHER): Payer: Medicaid Other | Admitting: Physician Assistant

## 2015-05-02 ENCOUNTER — Encounter: Payer: Self-pay | Admitting: Physician Assistant

## 2015-05-02 VITALS — BP 142/90 | HR 64 | Ht 63.0 in | Wt 174.8 lb

## 2015-05-02 DIAGNOSIS — E785 Hyperlipidemia, unspecified: Secondary | ICD-10-CM

## 2015-05-02 DIAGNOSIS — Z79899 Other long term (current) drug therapy: Secondary | ICD-10-CM | POA: Diagnosis not present

## 2015-05-02 DIAGNOSIS — I255 Ischemic cardiomyopathy: Secondary | ICD-10-CM

## 2015-05-02 NOTE — Progress Notes (Signed)
Cardiology Office Note   Date:  05/02/2015   ID:  [name] DryKimberly L Piccirilli, DOB [birth year], MRN [id number]  PCP:  Dorrene GermanAVBUERE,EDWIN A, MD  Cardiologist:  Dr William HamburgerBerry  Deolinda Frid, PA-C   Chief Complaint  Patient presents with  . Follow-up    6 week--monitor//pt c/o SOB--pt states she feels like she cannot catch her breath, taking a lot of deep breaths//pt states she gets really hot and then really cold, started 2-3 weeks ago    History of Present Illness: [name] is a 44 y.o. female with a history of MI w/ stent RCA 08/2014, HTN, DM, HL, Tobacco use. Admitted 09/23-10/08 with NSTEMI>>CABG x 3, EF now 45%, HIT positive; PEA arrest w/ VT, CGS>>IABP for 48 hours or so.   [name] DryKimberly L Lowder presents for follow-up of the above issues.  She is taking her metoprolol for blood pressure and is also taking lisinopril 20 mg daily. She is not having any problems with lower extremity edema, orthopnea or PND. She still does have some problems with dyspnea on exertion and feels like she has to take a deep breath occasionally to get air all the way into her lungs. She still has some chest discomfort that she feels is from the surgery but that has greatly improved.  She has not had presyncope or syncope. She is not exercising as much as she was prior to going back to work but she is doing well at work and not having any problems with it. She has not had significant palpitations or feel that her heart was skipping or racing.   Past Medical History  Diagnosis Date  . Hypertension   . Diabetes mellitus without complication (HCC)   . MI (myocardial infarction) (HCC)   . CAD (coronary artery disease)     cath 09/07/2014 DES to 95% prox RCA, residual 70-80% origin of AV groove, 90% ostial PDA stenosis; 01/2015, NSTEMI w/ CABG, LIMA-LAD, SVG-OM1, SVG-PCA  . Hyperlipidemia   . Tobacco abuse   . H/O medication noncompliance   . Carotid artery disease (HCC)     a. 40-59% BICA by duplex 01/2015.     Past Surgical History  Procedure Laterality Date  . Left heart catheterization with coronary angiogram N/A 05/29/2014    Procedure: LEFT HEART CATHETERIZATION WITH CORONARY ANGIOGRAM;  Surgeon: Corky CraftsJayadeep S Varanasi, MD;  [facility];  Service: Cardiovascular;  Laterality: N/A;  . Percutaneous coronary stent intervention (pci-s)  05/29/2014    Procedure: PERCUTANEOUS CORONARY STENT INTERVENTION (PCI-S);  Surgeon: Corky CraftsJayadeep S Varanasi, MD;  [facility];  Service: Cardiovascular;;  . Abdominal hysterectomy    . Left heart catheterization with coronary angiogram N/A 09/07/2014    Procedure: LEFT HEART CATHETERIZATION WITH CORONARY ANGIOGRAM;  Surgeon: Marykay Lexavid W Harding, MD;  [facility];  Service: Cardiovascular;  Laterality: N/A;  . Cardiac catheterization N/A 02/01/2015    Procedure: Left Heart Cath and Coronary Angiography;  Surgeon: Runell GessJonathan J Berry, MD;  [facility];  Service: Cardiovascular;  Laterality: N/A;  . Cardiac catheterization N/A 02/07/2015    Procedure: IABP Insertion;  Surgeon: Dolores Pattyaniel R Bensimhon, MD;  Location: [facility] INVASIVE CV LAB;  Service: Cardiovascular;  Laterality: N/A;  . Coronary artery bypass graft N/A 02/07/2015    Procedure: CORONARY ARTERY BYPASS GRAFTING (CABG);  Surgeon: Kerin PernaPeter Van Trigt, MD; LIMA-LAD, SVT-OM1, SVG-PDA  . Tee without cardioversion N/A 02/07/2015    Procedure: TRANSESOPHAGEAL ECHOCARDIOGRAM (TEE);  Surgeon: Kerin PernaPeter Van Trigt, MD;  Location: [facility] OR;  Service: Open Heart Surgery;  Laterality: N/A;    Current Outpatient Prescriptions  Medication Sig Dispense Refill  . acetaminophen (TYLENOL) 500 MG tablet Take 500 mg by mouth every 6 (six) hours as needed.    Marland Kitchen aspirin EC 81 MG tablet Take 81 mg by mouth daily.    . insulin glargine (LANTUS) 100 UNIT/ML injection Inject 20 Units into the skin at bedtime.     . lovastatin (MEVACOR) 20 MG tablet Take 1 tablet (20 mg total) by mouth at bedtime. 30 tablet 11  .  metFORMIN (GLUCOPHAGE) 500 MG tablet Take 1 tablet (500 mg total) by mouth 2 (two) times daily with a meal. 60 tablet 0  . metoprolol tartrate (LOPRESSOR) 50 MG tablet Take 1 tablet (50 mg total) by mouth 2 (two) times daily. 60 tablet 11  . Omega-3 Fatty Acids (FISH OIL) 1000 MG CAPS Take 1,000 mg by mouth at bedtime.    . ondansetron (ZOFRAN) 4 MG tablet Take 1 tablet (4 mg total) by mouth every 6 (six) hours. Prn n/v. 12 tablet 0   No current facility-administered medications for this visit.    Allergies:   Heparin    Social History:  The patient  reports that she has quit smoking. Her smoking use included Cigarettes. She smoked 0.00 packs per day. She has never used smokeless tobacco. She reports that she does not drink alcohol or use illicit drugs.   Family History:  The patient's family history includes Heart attack in her father; Heart disease in her father; Hypertension in her mother; Kidney failure in her mother.    ROS:  Please see the history of present illness. All other systems are reviewed and negative.    PHYSICAL EXAM: VS:  BP 142/90 mmHg  Pulse 64  Ht  (1.6 m)  Wt 174 lb 12.8 oz (79.289 kg)  BMI 30.97 kg/m2 , BMI Body mass index is 30.97 kg/(m^2). GEN: Well nourished, well developed, female in no acute distress HEENT: normal for age  Neck: no JVD, no carotid bruit, no masses Cardiac: RRR; no murmur, no rubs, or gallops Respiratory:  clear to auscultation bilaterally, normal work of breathing GI: soft, nontender, nondistended, + BS MS: no deformity or atrophy; no edema; distal pulses are 2+ in all 4 extremities  Skin: warm and dry, no rash Neuro:  Strength and sensation are intact Psych: euthymic mood, full affect   EKG:  EKG is not ordered today.  Recent Labs: 02/08/2015: Magnesium 1.9 02/12/2015: ALT 127* 02/15/2015: Hemoglobin 9.7*; Platelets 201 03/19/2015: BUN 12; Creat 0.69; Potassium 4.3; Sodium 138    Lipid Panel    Component Value Date/Time    CHOL 96 02/03/2015 0235   TRIG 88 02/03/2015 0235   HDL 32* 02/03/2015 0235   CHOLHDL 3.0 02/03/2015 0235   VLDL 18 02/03/2015 0235   LDLCALC 46 02/03/2015 0235     Wt Readings from Last 3 Encounters:  05/02/15 174 lb 12.8 oz (79.289 kg)  03/25/15 175 lb (79.379 kg)  03/19/15 176 lb 3.2 oz (79.924 kg)     Other studies Reviewed: Additional studies/ records that were reviewed today include: Office notes and hospital records.  ASSESSMENT AND PLAN:  1.  History of VT and PEA arrest: She was taken off amiodarone because of side effects. She is wearing her event monitor now and will complete that in the week or 2. We will follow up on those results. She is compliant with her beta blocker  and not having any significant palpitations.  2. Ischemic cardiomyopathy: She is to get an echocardiogram in 3 months in follow-up with Dr. Allyson Sabal at that time. She is to continue her ACE inhibitor and beta blocker  3. Hypertension: She has been checking her blood pressure fairly regularly at home. At home, her systolic ranges from 120-140 she believes, and her diastolic is generally less than 90. She is requested to record those blood pressures and let us know where she is running. She may benefit from a low-dose of diuretic for better blood pressure control but will hold off at this time.  4. CAD, status post CABG: She is continuing to recover from the surgery. She is encouraged to increase her activity and continue exercising as she was doing before she went back to work. Heart healthy eating is also encouraged   Current medicines are reviewed at length with the patient today.  The patient does not have concerns regarding medicines.  The following changes have been made:  no change  Labs/ tests ordered today include:   Orders Placed This Encounter  Procedures  . Lipid panel  . Hepatic function panel  . ECHOCARDIOGRAM COMPLETE     Disposition:   FU with Dr. Allyson Sabal in 3 months  Signed, Leanna Battles  05/02/2015 5:04 PM    [facility] [address] [place], [place], [place]  [address] Phone: [phone]; Fax: [phone]

## 2015-05-02 NOTE — Patient Instructions (Signed)
Medication Instructions:  Please continue your current medications  Labwork: Your physician recommends that you return for lab work in 3 months - FASTING.  Testing/Procedures: 1. 2D Echocardiogram in 3 months - Your physician has requested that you have an echocardiogram. Echocardiography is a painless test that uses sound waves to create images of your heart. It provides your doctor with information about the size and shape of your heart and how well your heart's chambers and valves are working. This procedure takes approximately one hour. There are no restrictions for this procedure.  Follow-Up: Theodore DemarkRhonda Barrett, PA-C, recommends that you schedule a follow-up appointment in 3 months with Dr Allyson SabalBerry.  If you need a refill on your cardiac medications before your next appointment, please call your pharmacy.

## 2015-05-17 ENCOUNTER — Ambulatory Visit (INDEPENDENT_AMBULATORY_CARE_PROVIDER_SITE_OTHER): Payer: Medicaid Other

## 2015-05-17 DIAGNOSIS — Z736 Limitation of activities due to disability: Secondary | ICD-10-CM

## 2015-05-17 DIAGNOSIS — I472 Ventricular tachycardia: Secondary | ICD-10-CM | POA: Diagnosis not present

## 2015-05-17 DIAGNOSIS — I4729 Other ventricular tachycardia: Secondary | ICD-10-CM

## 2015-05-23 ENCOUNTER — Telehealth: Payer: Self-pay | Admitting: Cardiovascular Disease

## 2015-05-23 MED ORDER — METFORMIN HCL 500 MG PO TABS: 500.0000 mg | ORAL_TABLET | Freq: Two times a day (BID) | ORAL | Status: DC

## 2015-05-23 NOTE — Addendum Note (Signed)
Addended by: Lake BellsEAGUE, Brave Dack J on: 05/23/2015 05:06 PM   Modules accepted: Orders

## 2015-05-23 NOTE — Telephone Encounter (Signed)
Patient out or metformin filled by our office.  Refill given for 3 months. Appointment with Theodore Demarkhonda Barrett 12/22 Is to follow with Dr. Allyson SabalBerry in 3 moths according to OV of 05/02/15.  No appointment made as yet

## 2015-05-23 NOTE — Telephone Encounter (Signed)
Please call,concerning her medicine. °

## 2015-06-06 ENCOUNTER — Telehealth: Payer: Self-pay | Admitting: Cardiovascular Disease

## 2015-06-06 NOTE — Telephone Encounter (Signed)
New message    Patient calling wants to know can she take Advil.

## 2015-06-06 NOTE — Telephone Encounter (Signed)
Advised ibuprofen OK for occasional use, if routine use for a few days recommend taking w food to minimize potential for GI bleed/upset. Pt voiced understanding.

## 2015-08-19 ENCOUNTER — Other Ambulatory Visit: Payer: Self-pay

## 2015-08-19 ENCOUNTER — Ambulatory Visit (HOSPITAL_COMMUNITY): Payer: Medicaid Other | Attending: Cardiology

## 2015-08-19 DIAGNOSIS — E119 Type 2 diabetes mellitus without complications: Secondary | ICD-10-CM | POA: Diagnosis not present

## 2015-08-19 DIAGNOSIS — I10 Essential (primary) hypertension: Secondary | ICD-10-CM | POA: Insufficient documentation

## 2015-08-19 DIAGNOSIS — I2510 Atherosclerotic heart disease of native coronary artery without angina pectoris: Secondary | ICD-10-CM | POA: Insufficient documentation

## 2015-08-19 DIAGNOSIS — I255 Ischemic cardiomyopathy: Secondary | ICD-10-CM | POA: Diagnosis not present

## 2015-08-19 DIAGNOSIS — I252 Old myocardial infarction: Secondary | ICD-10-CM | POA: Insufficient documentation

## 2015-08-30 ENCOUNTER — Ambulatory Visit (INDEPENDENT_AMBULATORY_CARE_PROVIDER_SITE_OTHER): Payer: Medicaid Other | Admitting: Cardiovascular Disease

## 2015-08-30 ENCOUNTER — Encounter: Payer: Self-pay | Admitting: Cardiovascular Disease

## 2015-08-30 VITALS — BP 129/85 | HR 75 | Ht 63.0 in | Wt 187.1 lb

## 2015-08-30 DIAGNOSIS — E785 Hyperlipidemia, unspecified: Secondary | ICD-10-CM | POA: Diagnosis not present

## 2015-08-30 DIAGNOSIS — I10 Essential (primary) hypertension: Secondary | ICD-10-CM | POA: Diagnosis not present

## 2015-08-30 DIAGNOSIS — Z951 Presence of aortocoronary bypass graft: Secondary | ICD-10-CM | POA: Diagnosis not present

## 2015-08-30 NOTE — Assessment & Plan Note (Signed)
History of moderate bilateral ICA stenosis at the time of the pre- CABG Doppler imaging. We will repeat these in September of this year

## 2015-08-30 NOTE — Progress Notes (Signed)
08/30/2015 [name]   06/24/1970  [id number]  Primary Physician [name] German, MD Primary Cardiologist: [name] Gess MD [name]   HPI:  [name] is a 45 year old moderately overweight African-American female with history of tobacco abuse, diabetes, hypertension, hyperlipidemia and medication noncompliance. She's had RCA intervention in the past with re-intervention on 09/07/14. She had accelerated angina and underwent repeat cardiac catheterization by myself 02/01/15 revealing three-vessel disease with moderate LV dysfunction. She'll only underwent coronary artery bypass grafting X 3 by Dr. Kathlee Nations [name] 02/07/15 with a LIMA to LAD, vein to obtuse marginal branch and PDA. Several days postop she had a PDA arrest requiring resuscitation, intubation and placement of an intra-aortic balloon pump. Ultimately she was discharged home. She is recuperating nicely. She did stop smoking. Her most recent 2-D echo performed 08/19/15 revealed EF of 45-50%.  Current Outpatient Prescriptions  Medication Sig Dispense Refill  . acetaminophen (TYLENOL) 500 MG tablet Take 500 mg by mouth every 6 (six) hours as needed.    Marland Kitchen aspirin EC 81 MG tablet Take 81 mg by mouth daily.    . insulin glargine (LANTUS) 100 UNIT/ML injection Inject 20 Units into the skin at bedtime.     . lovastatin (MEVACOR) 20 MG tablet Take 1 tablet (20 mg total) by mouth at bedtime. 30 tablet 11  . metFORMIN (GLUCOPHAGE) 500 MG tablet Take 1 tablet (500 mg total) by mouth 2 (two) times daily with a meal. 60 tablet 3  . metoprolol tartrate (LOPRESSOR) 50 MG tablet Take 1 tablet (50 mg total) by mouth 2 (two) times daily. 60 tablet 11  . Omega-3 Fatty Acids (FISH OIL) 1000 MG CAPS Take 1,000 mg by mouth at bedtime.    . ondansetron (ZOFRAN) 4 MG tablet Take 1 tablet (4 mg total) by mouth every 6 (six) hours. Prn n/v. 12 tablet 0   No current facility-administered medications for this visit.    Allergies    Allergen Reactions  . 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 Marital Status: Single    Spouse Name: N/A  . Number of Children: 2  . Years of Education: N/A   Occupational History  . Not on file.   Social History Main Topics  . Smoking status: Former Smoker -- 0.00 packs/day    Types: Cigarettes  . Smokeless tobacco: Never Used  . Alcohol Use: No  . Drug Use: No  . Sexual Activity: Not on file   Other Topics Concern  . Not on file   Social History Narrative     Review of Systems: General: negative for chills, fever, night sweats or weight changes.  Cardiovascular: negative for chest pain, dyspnea on exertion, edema, orthopnea, palpitations, paroxysmal nocturnal dyspnea or shortness of breath Dermatological: negative for rash Respiratory: negative for cough or wheezing Urologic: negative for hematuria Abdominal: negative for nausea, vomiting, diarrhea, bright red blood per rectum, melena, or hematemesis Neurologic: negative for visual changes, syncope, or dizziness All other systems reviewed and are otherwise negative except as noted above.    Blood pressure 129/85, pulse 75, height  (1.6 m), weight 187 lb 2 oz (84.879 kg).  General appearance: alert and no distress Neck: no adenopathy, no carotid bruit, no JVD, supple, symmetrical, trachea midline and thyroid not enlarged, symmetric, no tenderness/mass/nodules Lungs: clear to auscultation bilaterally Heart:  regular rate and rhythm, S1, S2 normal, no murmur, click, rub or gallop Extremities: extremities normal, atraumatic, no cyanosis or edema  EKG not performed today  ASSESSMENT AND PLAN:   Essential hypertension History history of hypertension blood pressure measured at 129/85. She is on Lopressor. Continue current meds at current dosing  Hyperlipidemia with target LDL less than 70 History  of hyperlipidemia on statin therapy with recent lipid profile performed 02/03/15 revealing total cholesterol 96, LDL 46 and HDL of 32  S/P CABG x 3 History of CAD status post non-STEMI back in January 2016. She underwent PCI and stenting of her circumflex followed by her RCA with synergy drug-eluting stents. She was recatheterized 02/01/15 revealing inferoapical and posterior hypokinesia with an EF of 35-45%. She had 70% segmental proximal LAD, 99% ostial circumflex and 95% PDA stenosis. Several days later she underwent coronary artery bypass grafting X 3 on 02/07/15 with a LIMA to her LAD, vein to an obtuse marginal branch and PDA. [name]. performed the surgery.several days later she had a PE a arrest requiring resuscitation and placement of an intra-aortic balloon pump. Ultimately she recovered. Her most recent ejection fraction by 2-D echo is 45-50% performed 08/19/15. She denies chest pain or shortness of breath.  Carotid artery disease (HCC) History of moderate bilateral ICA stenosis at the time of the pre- CABG Doppler imaging. We will repeat these in September of this year      [name] GessJonathan J. Oluwaferanmi Wain MD [name] RenoFACP,FACC,FAHA, FSCAI 08/30/2015 3:38 PM

## 2015-08-30 NOTE — Assessment & Plan Note (Signed)
History of hyperlipidemia on statin therapy with recent lipid profile performed 02/03/15 revealing total cholesterol 96, LDL 46 and HDL of 32

## 2015-08-30 NOTE — Patient Instructions (Signed)
Medication Instructions:  NO CHANGES   Labwork: NONE  Testing/Procedures: NONE  Follow-Up: 6 MONTHS WITH AN EXTENDER  12 MONTH WITH DR BERRY   Any Other Special Instructions Will Be Listed Below (If Applicable).     If you need a refill on your cardiac medications before your next appointment, please call your pharmacy.

## 2015-08-30 NOTE — Assessment & Plan Note (Signed)
History history of hypertension blood pressure measured at 129/85. She is on Lopressor. Continue current meds at current dosing

## 2015-08-30 NOTE — Assessment & Plan Note (Signed)
History of CAD status post non-STEMI back in January 2016. She underwent PCI and stenting of her circumflex followed by her RCA with synergy drug-eluting stents. She was recatheterized 02/01/15 revealing inferoapical and posterior hypokinesia with an EF of 35-45%. She had 70% segmental proximal LAD, 99% ostial circumflex and 95% PDA stenosis. Several days later she underwent coronary artery bypass grafting X 3 on 02/07/15 with a LIMA to her LAD, vein to an obtuse marginal branch and PDA. Dr. Donata ClayVan Trigt. performed the surgery.several days later she had a PE a arrest requiring resuscitation and placement of an intra-aortic balloon pump. Ultimately she recovered. Her most recent ejection fraction by 2-D echo is 45-50% performed 08/19/15. She denies chest pain or shortness of breath.

## 2015-09-27 ENCOUNTER — Telehealth: Payer: Self-pay | Admitting: Cardiovascular Disease

## 2015-09-27 MED ORDER — METFORMIN HCL 500 MG PO TABS: 500.0000 mg | ORAL_TABLET | Freq: Two times a day (BID) | ORAL | Status: DC

## 2015-09-27 NOTE — Telephone Encounter (Signed)
Returned call to patient. She said she will run out of her pills on Tuesday. She is in the process of finding a PCP. This office has refilled her medication in the past. Will refill it for one more month and stressed the importance of getting a PCP to manage her diabetes. Pt verbalized understanding.

## 2015-09-27 NOTE — Telephone Encounter (Signed)
New message       *STAT* If patient is at the pharmacy, call can be transferred to refill team.   1. Which medications need to be refilled? (please list name of each medication and dose if known) metformin 2. Which pharmacy/location (including street and city if local pharmacy) is medication to be sent to? [facility] @  [address] 3. Do they need a 30 day or 90 day supply? 30 day

## 2016-01-29 ENCOUNTER — Ambulatory Visit (HOSPITAL_COMMUNITY)
Admission: RE | Admit: 2016-01-29 | Discharge: 2016-01-29 | Disposition: A | Discharge status: Home / Self Care | Payer: Self-pay | Source: Ambulatory Visit | Attending: Internal Medicine | Admitting: Internal Medicine

## 2016-01-29 DIAGNOSIS — Z951 Presence of aortocoronary bypass graft: Secondary | ICD-10-CM | POA: Insufficient documentation

## 2016-01-29 DIAGNOSIS — I6523 Occlusion and stenosis of bilateral carotid arteries: Secondary | ICD-10-CM

## 2016-01-29 DIAGNOSIS — E785 Hyperlipidemia, unspecified: Secondary | ICD-10-CM | POA: Insufficient documentation

## 2016-01-29 DIAGNOSIS — I10 Essential (primary) hypertension: Secondary | ICD-10-CM | POA: Insufficient documentation

## 2016-02-06 ENCOUNTER — Telehealth: Payer: Self-pay | Admitting: *Deleted

## 2016-02-06 DIAGNOSIS — I779 Disorder of arteries and arterioles, unspecified: Secondary | ICD-10-CM

## 2016-02-06 DIAGNOSIS — I739 Peripheral vascular disease, unspecified: Principal | ICD-10-CM

## 2016-02-06 NOTE — Telephone Encounter (Signed)
-----   Message from Runell GessJonathan J Berry, MD sent at 02/03/2016 10:39 AM EDT ----- No change from prior study. Repeat in 12 months.

## 2016-02-06 NOTE — Telephone Encounter (Signed)
Notes Recorded by Stann MainlandJennifer O Clark, RN on 02/06/2016 at 11:02 AM EDT Results called to pt. Pt verbalized understanding. Repeat order entered. ------

## 2016-02-18 ENCOUNTER — Ambulatory Visit (INDEPENDENT_AMBULATORY_CARE_PROVIDER_SITE_OTHER): Payer: Self-pay | Admitting: Physician Assistant

## 2016-02-18 ENCOUNTER — Encounter: Payer: Self-pay | Admitting: Physician Assistant

## 2016-02-18 VITALS — BP 124/78 | HR 73 | Ht 62.0 in | Wt 187.2 lb

## 2016-02-18 DIAGNOSIS — E785 Hyperlipidemia, unspecified: Secondary | ICD-10-CM

## 2016-02-18 DIAGNOSIS — I10 Essential (primary) hypertension: Secondary | ICD-10-CM

## 2016-02-18 DIAGNOSIS — I2583 Coronary atherosclerosis due to lipid rich plaque: Secondary | ICD-10-CM

## 2016-02-18 DIAGNOSIS — I2510 Atherosclerotic heart disease of native coronary artery without angina pectoris: Secondary | ICD-10-CM

## 2016-02-18 MED ORDER — LOVASTATIN 20 MG PO TABS: 20.0000 mg | ORAL_TABLET | Freq: Every day | ORAL | 11 refills | Status: DC

## 2016-02-18 MED ORDER — METOPROLOL TARTRATE 50 MG PO TABS: 50.0000 mg | ORAL_TABLET | Freq: Two times a day (BID) | ORAL | 11 refills | Status: DC

## 2016-02-18 NOTE — Patient Instructions (Signed)
Medication Instructions:   Your physician recommends that you continue on your current medications as directed. Please refer to the Current Medication list given to you today.    If you need a refill on your cardiac medications before your next appointment, please call your pharmacy.  Labwork: RETURN THE FIRST Monday IN November FOR FASTING CMET AND LIPIDS   Testing/Procedures:  NONE ORDER TODAY      Follow-Up: Your physician wants you to follow-up in: ONE YEAR WITH  BERRY  You will receive a reminder letter in the mail two months in advance. If you don't receive a letter, please call our office to schedule the follow-up appointment.     Any Other Special Instructions Will Be Listed Below (If Applicable).

## 2016-02-18 NOTE — Progress Notes (Signed)
Cardiology Office Note   Date:  02/23/2016   ID:  [name], DOB 30-Dec-1970, MRN [id number]  PCP:  Dorrene German, MD  Cardiologist:  Dr Allyson Sabal 08/2015 [name] Demark, PA-C   Chief Complaint  Patient presents with  . Shortness of Breath    pt states sometimes when doing something     History of Present Illness: [name] is a 45 y.o. female with a history of remote tob use, DM, HTN, HLD, med noncompliance, RCA PCI x 2, CFX PCI  then CABG 01/2015 w/ LIMA-LAD, SVG-OM, SVG-PDA (complicated by PEA arrest req IABP). EF 45-50% by echo 08/2015  [name] presents for Cardiology followup.  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 She is trying to take better care of herself. She is not having chest pain. She was having DOE but that improved as her activity level improved. She is not having LE edema, orthopnea or PND. She does not watch the salt that carefully but says she will try.  She says she is taking her meds. She is not having side effects from the medications. She has not had lab work done in a long time.    Past Medical History:  Diagnosis Date  . CAD (coronary artery disease)    cath 09/07/2014 DES to 95% prox RCA, residual 70-80% origin of AV groove, 90% ostial PDA stenosis; 01/2015, NSTEMI w/ CABG, LIMA-LAD, SVG-OM1, SVG-PCA  . Carotid artery disease (HCC)    a. 40-59% BICA by duplex 01/2015.  . Diabetes mellitus without complication (HCC)   . H/O medication noncompliance   . Hyperlipidemia   . Hypertension   . MI (myocardial infarction)   . Tobacco abuse     Past Surgical History:  Procedure Laterality Date  . ABDOMINAL HYSTERECTOMY    . CARDIAC CATHETERIZATION N/A 02/01/2015   Procedure: Left Heart Cath and Coronary Angiography;  Surgeon: Runell Gess, MD;  Location: [facility] INVASIVE CV LAB;  Service: Cardiovascular;  Laterality: N/A;  . CARDIAC CATHETERIZATION N/A 02/07/2015   Procedure: IABP Insertion;  Surgeon: Dolores Patty, MD;  Location: [facility] INVASIVE CV LAB;  Service: Cardiovascular;  Laterality: N/A;  . CORONARY ARTERY BYPASS GRAFT N/A 02/07/2015   Procedure: CORONARY ARTERY BYPASS GRAFTING (CABG);  Surgeon: Kerin Perna, MD; LIMA-LAD, SVT-OM1, SVG-PDA  . LEFT HEART CATHETERIZATION WITH CORONARY ANGIOGRAM N/A 05/29/2014   Procedure: LEFT HEART CATHETERIZATION WITH CORONARY ANGIOGRAM;  Surgeon: Corky Crafts, MD;  Location: [facility] CATH LAB;  Service: Cardiovascular;  Laterality: N/A;  . LEFT HEART CATHETERIZATION WITH CORONARY ANGIOGRAM N/A 09/07/2014   Procedure: LEFT HEART CATHETERIZATION WITH CORONARY ANGIOGRAM;  Surgeon: Marykay Lex, MD;  Location: [facility] CATH LAB;  Service: Cardiovascular;  Laterality: N/A;  . PERCUTANEOUS CORONARY STENT INTERVENTION (PCI-S)  05/29/2014   Procedure: PERCUTANEOUS CORONARY STENT INTERVENTION (PCI-S);  Surgeon: Corky Crafts, MD;  Location: [facility] CATH LAB;  Service: Cardiovascular;;  . TEE WITHOUT CARDIOVERSION N/A 02/07/2015   Procedure: TRANSESOPHAGEAL ECHOCARDIOGRAM (TEE);  Surgeon: Kerin Perna, MD;  Location: [facility] OR;  Service: Open Heart Surgery;  Laterality: N/A;    Current Outpatient Prescriptions  Medication Sig Dispense Refill  . acetaminophen (TYLENOL) 500 MG tablet Take 500 mg by mouth every 6 (six) hours as needed.    Marland Kitchen aspirin EC 81 MG tablet Take 81 mg by mouth daily.    . insulin glargine (LANTUS) 100 UNIT/ML injection Inject 20 Units into the skin at bedtime.     Marland Kitchen  lovastatin (MEVACOR) 20 MG tablet Take 1 tablet (20 mg total) by mouth at bedtime. 30 tablet 11  . metFORMIN (GLUCOPHAGE) 500 MG tablet Take 1 tablet (500 mg total) by mouth 2 (two) times daily with a meal. Pt will need to establish care with PCP for next refills. 60 tablet 0  . metoprolol (LOPRESSOR) 50 MG tablet Take 1 tablet (50 mg total) by mouth 2 (two) times daily. 60 tablet 11  . Omega-3 Fatty Acids (FISH OIL) 1000 MG CAPS Take 1,000 mg by mouth at bedtime.    . ondansetron  (ZOFRAN) 4 MG tablet Take 1 tablet (4 mg total) by mouth every 6 (six) hours. Prn n/v. 12 tablet 0   No current facility-administered medications for this visit.     Allergies:   Heparin    Social History:  The patient  reports that she has quit smoking. Her smoking use included Cigarettes. She smoked 0.00 packs per day. She has never used smokeless tobacco. She reports that she does not drink alcohol or use drugs.   Family History:  The patient's family history includes Heart attack (age of onset: 69) in her father; Heart disease in her father; Hypertension in her mother; Kidney failure in her mother; Stroke (age of onset: 69) in her mother.    ROS:  Please see the history of present illness. All other systems are reviewed and negative.    PHYSICAL EXAM: VS:  BP 124/78   Pulse 73   Ht 5\' 2"  (1.575 m)   Wt 187 lb 3.2 oz (84.9 kg)   BMI 34.24 kg/m  , BMI Body mass index is 34.24 kg/m. GEN: Well nourished, well developed, female in no acute distress  HEENT: normal for age  Neck: no JVD, no carotid bruit, no masses Cardiac: RRR; no murmur, no rubs, or gallops Respiratory:  clear to auscultation bilaterally, normal work of breathing GI: soft, nontender, nondistended, + BS [name]: no deformity or atrophy; no edema; distal pulses are 2+ in all 4 extremities   Skin: warm and [name], no rash Neuro:  Strength and sensation are intact Psych: euthymic mood, full affect   EKG:  EKG is ordered today. The ekg ordered today demonstrates SR, HR 71, T wave inversions in II, AVF, V6, slightly different from 03/2015   Recent Labs: 03/19/2015: BUN 12; Creat 0.69; Potassium 4.3; Sodium 138    Lipid Panel    Component Value Date/Time   CHOL 96 02/03/2015 0235   TRIG 88 02/03/2015 0235   HDL 32 (L) 02/03/2015 0235   CHOLHDL 3.0 02/03/2015 0235   VLDL 18 02/03/2015 0235   LDLCALC 46 02/03/2015 0235     Wt Readings from Last 3 Encounters:  02/18/16 187 lb 3.2 oz (84.9 kg)  08/30/15 187 lb 2  oz (84.9 kg)  05/02/15 174 lb 12.8 oz (79.3 kg)     Other studies Reviewed: Additional studies/ records that were reviewed today include: office notes, hospital records and testing.  ASSESSMENT AND PLAN:  1.  CAD s/p CABG 2016: She is doing well from a cardiac standpoint. She is working and doing well. She is on ASA, BB, statin and fish oil. Continue these  2. HTN: Her BP is well-controlled on current rx, no changes, renew rx as needed.  3. Hyperlipidemia: she is on a statin, limited by finances, says she will take the $4 one from [facility]. Check CMET and lipid profile.   Current medicines are reviewed at length with the patient today.  The patient does not have concerns regarding medicines.  The following changes have been made:  no change  Labs/ tests ordered today include:   Orders Placed This Encounter  Procedures  . Comprehensive metabolic panel  . Lipid panel  . EKG 12-Lead     Disposition:   FU with Dr Allyson SabalBerry in 1 year.  Tawny AsalSigned, Charon Akamine, PA-C  02/23/2016 4:51 PM    [facility] Phone: [phone]) [phone]; Fax: [phone]) [phone]  This note was written with the assistance of speech recognition software. Please excuse any transcriptional errors.

## 2016-02-23 ENCOUNTER — Encounter: Payer: Self-pay | Admitting: Physician Assistant

## 2016-02-27 ENCOUNTER — Ambulatory Visit: Payer: Medicaid Other | Admitting: Physician Assistant

## 2016-03-17 ENCOUNTER — Telehealth: Payer: Self-pay | Admitting: Cardiovascular Disease

## 2016-03-17 MED ORDER — METOPROLOL TARTRATE 50 MG PO TABS: 50.0000 mg | ORAL_TABLET | Freq: Two times a day (BID) | ORAL | 11 refills | Status: DC

## 2016-03-17 MED ORDER — LOVASTATIN 20 MG PO TABS: 20.0000 mg | ORAL_TABLET | Freq: Every day | ORAL | 11 refills | Status: DC

## 2016-03-17 NOTE — Telephone Encounter (Signed)
New Message   *STAT* If patient is at the pharmacy, call can be transferred to refill team.   1. Which medications need to be refilled? (please list name of each medication and dose if known)  metoprolol 50 mg tablets twice daily lovastatin 20 mg tablet once daily  2. Which pharmacy/location (including street and city if local pharmacy) is medication to be sent to? [facility] [place]  3. Do they need a 30 day or 90 day supply?  30 day supply

## 2016-03-17 NOTE — Telephone Encounter (Signed)
rx refilled.

## 2016-09-08 ENCOUNTER — Ambulatory Visit: Payer: Self-pay | Admitting: Cardiovascular Disease

## 2016-10-07 ENCOUNTER — Ambulatory Visit (INDEPENDENT_AMBULATORY_CARE_PROVIDER_SITE_OTHER): Payer: Self-pay | Admitting: Cardiovascular Disease

## 2016-10-07 ENCOUNTER — Encounter: Payer: Self-pay | Admitting: Cardiovascular Disease

## 2016-10-07 VITALS — BP 148/76 | HR 67 | Ht 63.0 in | Wt 189.6 lb

## 2016-10-07 DIAGNOSIS — I739 Peripheral vascular disease, unspecified: Secondary | ICD-10-CM

## 2016-10-07 DIAGNOSIS — E785 Hyperlipidemia, unspecified: Secondary | ICD-10-CM

## 2016-10-07 DIAGNOSIS — I779 Disorder of arteries and arterioles, unspecified: Secondary | ICD-10-CM

## 2016-10-07 DIAGNOSIS — Z951 Presence of aortocoronary bypass graft: Secondary | ICD-10-CM

## 2016-10-07 DIAGNOSIS — I10 Essential (primary) hypertension: Secondary | ICD-10-CM

## 2016-10-07 NOTE — Patient Instructions (Signed)
Medication Instructions: Your physician recommends that you continue on your current medications as directed. Please refer to the Current Medication list given to you today.  Labwork: Your physician recommends that you return for a FASTING lipid profile and hepatic function panel at your earliest convenience.  Testing/Procedures: Schedule Carotid doppler for September.  Follow-Up: We request that you follow-up in: 6 months with [name] Demarkhonda Barrett, PA and in 12 months with Dr San MorelleBerry  You will receive a reminder letter in the mail two months in advance. If you don't receive a letter, please call our office to schedule the follow-up appointment.  If you need a refill on your cardiac medications before your next appointment, please call your pharmacy.

## 2016-10-07 NOTE — Assessment & Plan Note (Signed)
History of essential hypertension blood pressure measures 148/76. She is on metoprolol. Continue current meds at current dosing

## 2016-10-07 NOTE — Assessment & Plan Note (Signed)
History of hyperlipidemia on statin therapy. We will recheck a lipid and liver profile 

## 2016-10-07 NOTE — Assessment & Plan Note (Signed)
History of mild lateral IC stenosis by 2 tablets ultrasound 01/29/16. She is neurologically symptomatic. This will be rechecked in September of this year.

## 2016-10-07 NOTE — Assessment & Plan Note (Signed)
History of CAD status post RCA intervention with 3 intervention 09/07/14. I catheterized her 02/01/15 because of accelerated symptoms and she ultimately required coronary artery bypass grafting 3 by Dr. Kathlee NationsPeter Van Trigt 02/07/15 with a LIMA to LAD, vein to obtuse marginal branch and PDA. She apparently had a PDA rest several days postop requiring resuscitation, intubation and placement of an intra-aortic balloon pump. She ultimately was discharged, recuperated nicely. She did stop smoking. She denies chest pain or shortness of breath. Her most recent echo performed 08/19/15 revealed an ejection fraction of 45-50%.

## 2016-10-07 NOTE — Progress Notes (Signed)
10/07/2016 [name]   1970/06/29  [id number]  Primary Physician Fleet ContrasAvbuere, Edwin, MD Primary Cardiologist: Runell GessJonathan J Jordi Kamm MD Roseanne RenoFACP, FACC, FAHA, FSCAI  HPI:  Mr. [name] is a 46 year old moderately overweight African-American female with history of tobacco abuse, diabetes, hypertension, hyperlipidemia and medication noncompliance. I last saw her in the office 08/30/15. She's had RCA intervention in the past with re-intervention on 09/07/14. She had accelerated angina and underwent repeat cardiac catheterization by myself 02/01/15 revealing three-vessel disease with moderate LV dysfunction. She'll only underwent coronary artery bypass grafting X 3 by Dr. Kathlee NationsPeter Van Trigt 02/07/15 with a LIMA to LAD, vein to obtuse marginal branch and PDA. Several days postop she had a PDA arrest requiring resuscitation, intubation and placement of an intra-aortic balloon pump. Ultimately she was discharged home. She is recuperating nicely. She did stop smoking. Her most recent 2-D echo performed 08/19/15 revealed EF of 45-50%. Since I saw her back she's remained clinically stable. She does not smoke. She eats a relatively healthy diet according to her. She has not had blood work in a while and we will recheck a lipid and liver profile.   Current Outpatient Prescriptions  Medication Sig Dispense Refill  . acetaminophen (TYLENOL) 500 MG tablet Take 500 mg by mouth every 6 (six) hours as needed.    Marland Kitchen. aspirin EC 81 MG tablet Take 81 mg by mouth daily.    . insulin glargine (LANTUS) 100 UNIT/ML injection Inject 20 Units into the skin at bedtime.     . lovastatin (MEVACOR) 20 MG tablet Take 1 tablet (20 mg total) by mouth at bedtime. 30 tablet 11  . metFORMIN (GLUCOPHAGE) 500 MG tablet Take 1 tablet (500 mg total) by mouth 2 (two) times daily with a meal. Pt will need to establish care with PCP for next refills. 60 tablet 0  . metoprolol (LOPRESSOR) 50 MG tablet Take 1 tablet (50 mg total) by mouth 2 (two) times  daily. 60 tablet 11  . Omega-3 Fatty Acids (FISH OIL) 1000 MG CAPS Take 1,000 mg by mouth at bedtime.    . ondansetron (ZOFRAN) 4 MG tablet Take 1 tablet (4 mg total) by mouth every 6 (six) hours. Prn n/v. 12 tablet 0   No current facility-administered medications for this visit.     Allergies  Allergen Reactions  . 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 Marital status: Single    Spouse name: N/A  . Number of children: 2  . Years of education: N/A   Occupational History  . CNA [employer]   Social History Main Topics  . Smoking status: Former Smoker    Packs/day: 0.00    Types: Cigarettes    Quit date: 01/10/2015  . Smokeless tobacco: Never Used  . Alcohol use No  . Drug use: No  . Sexual activity: Not on file   Other Topics Concern  . Not on file   Social History Narrative  . No narrative on file     Review of Systems: General: negative for chills, fever, night sweats or weight changes.  Cardiovascular: negative for chest pain, dyspnea on exertion, edema, orthopnea, palpitations, paroxysmal nocturnal dyspnea or shortness of breath Dermatological: negative for rash Respiratory: negative for cough or wheezing Urologic: negative for hematuria Abdominal: negative for nausea, vomiting, diarrhea, bright red blood per rectum,  melena, or hematemesis Neurologic: negative for visual changes, syncope, or dizziness All other systems reviewed and are otherwise negative except as noted above.    Blood pressure (!) 148/76, pulse 67, height 5\' 3"  (1.6 m), weight 189 lb 9.6 oz (86 kg).  General appearance: alert and no distress Neck: no adenopathy, no carotid bruit, no JVD, supple, symmetrical, trachea midline and thyroid not enlarged, symmetric, no tenderness/mass/nodules Lungs: clear to auscultation bilaterally Heart: regular rate and rhythm, S1, S2  normal, no murmur, click, rub or gallop Extremities: extremities normal, atraumatic, no cyanosis or edema  EKG Sinus rhythm at 67 with evidence of LVH with repolarization changes. I personally reviewed this EKG  ASSESSMENT AND PLAN:   Essential hypertension History of essential hypertension blood pressure measures 148/76. She is on metoprolol. Continue current meds at current dosing  Hyperlipidemia with target LDL less than 70 History of hyperlipidemia on statin therapy. We will recheck a lipid and liver profile  S/P CABG x 3 History of CAD status post RCA intervention with 3 intervention 09/07/14. I catheterized her 02/01/15 because of accelerated symptoms and she ultimately required coronary artery bypass grafting 3 by Dr. Kathlee Nations Trigt 02/07/15 with a LIMA to LAD, vein to obtuse marginal branch and PDA. She apparently had a PDA rest several days postop requiring resuscitation, intubation and placement of an intra-aortic balloon pump. She ultimately was discharged, recuperated nicely. She did stop smoking. She denies chest pain or shortness of breath. Her most recent echo performed 08/19/15 revealed an ejection fraction of 45-50%.  Carotid artery disease (HCC) History of mild lateral IC stenosis by 2 tablets ultrasound 01/29/16. She is neurologically symptomatic. This will be rechecked in September of this year.      Runell Gess MD FACP,FACC,FAHA, [facility] 10/07/2016 11:47 AM

## 2016-12-30 IMAGING — DX DG CHEST 2V
2 series · 2 of 2 positions shown · non-contrast
Comparison: 01/31/2015

CLINICAL DATA: Shortness of breath, mid sternal chest pain.

EXAM:
CHEST  2 VIEW

[w chest pa]
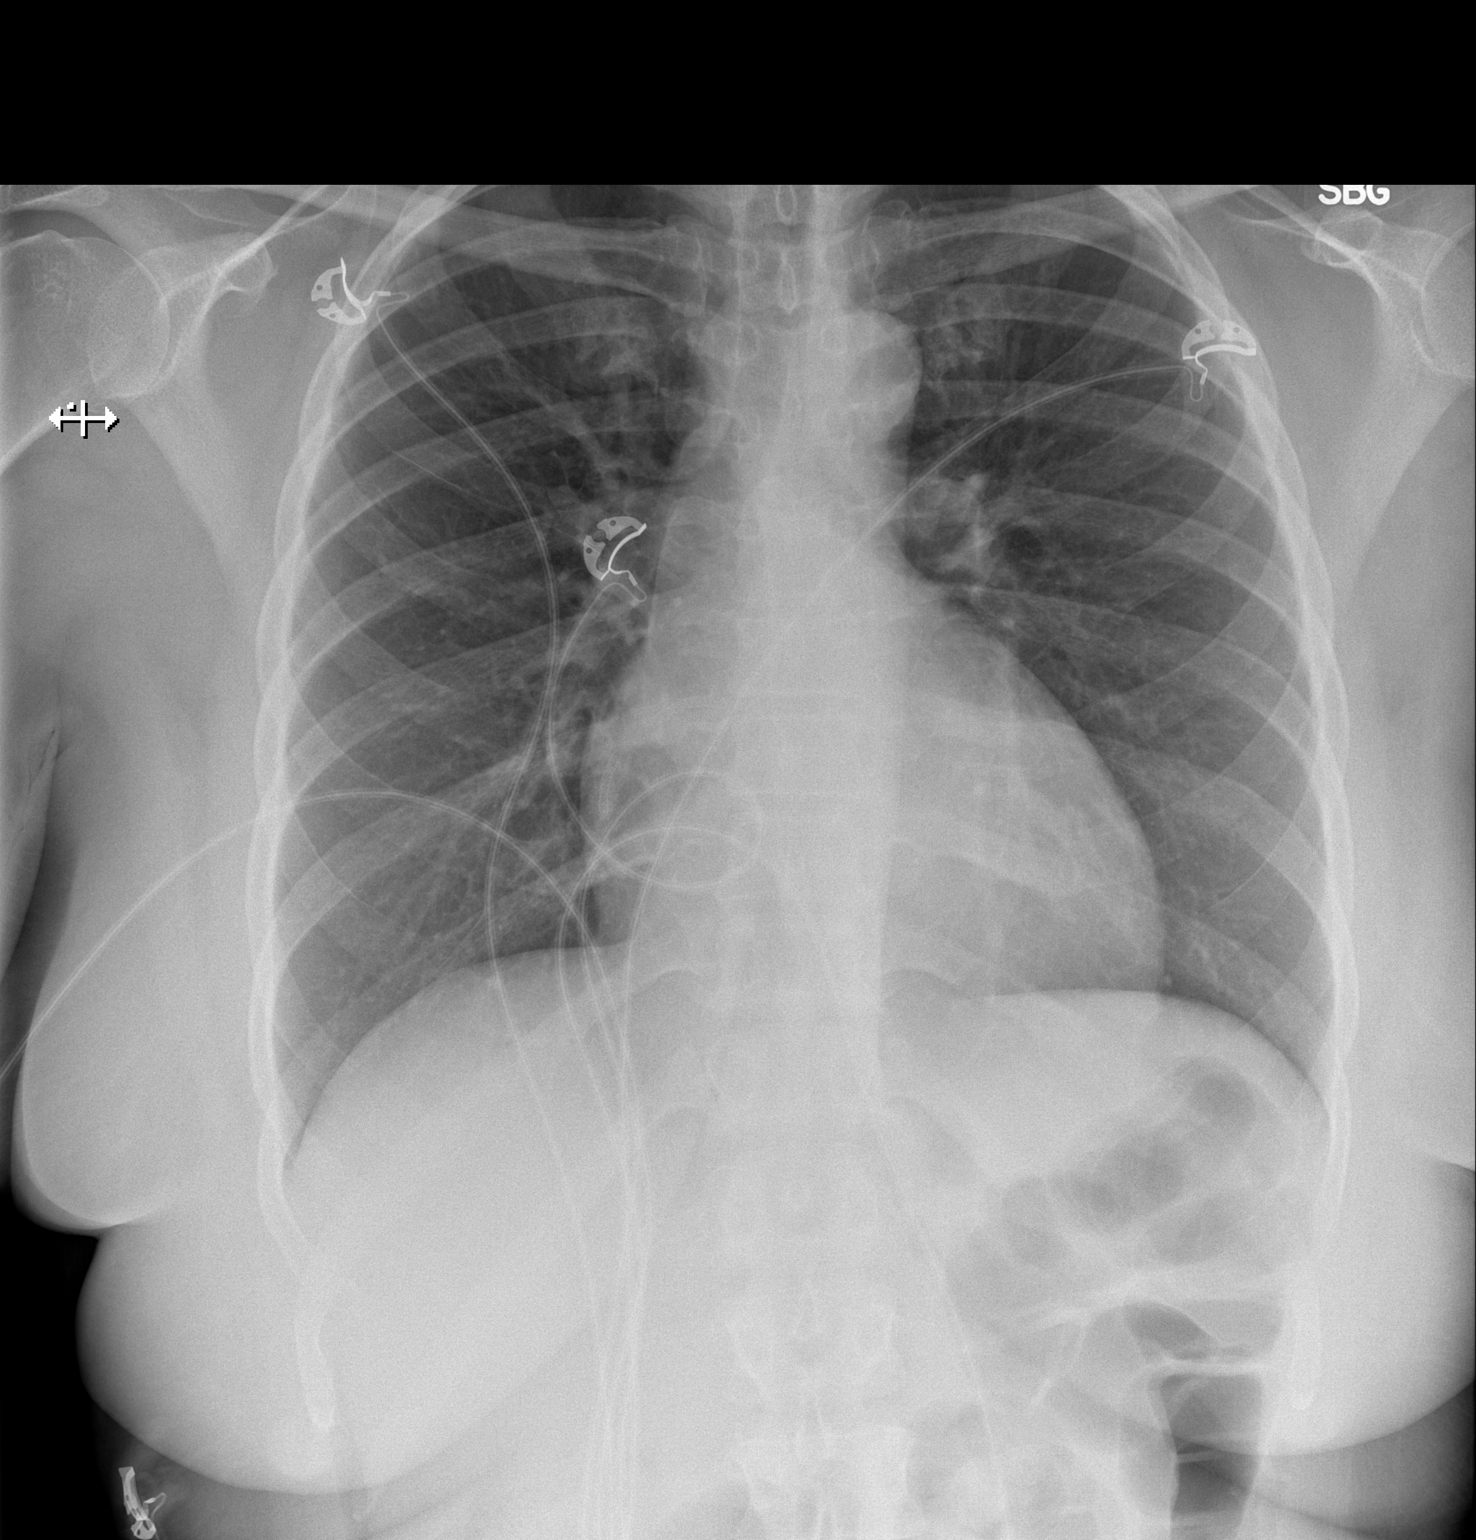

[w chest lat]
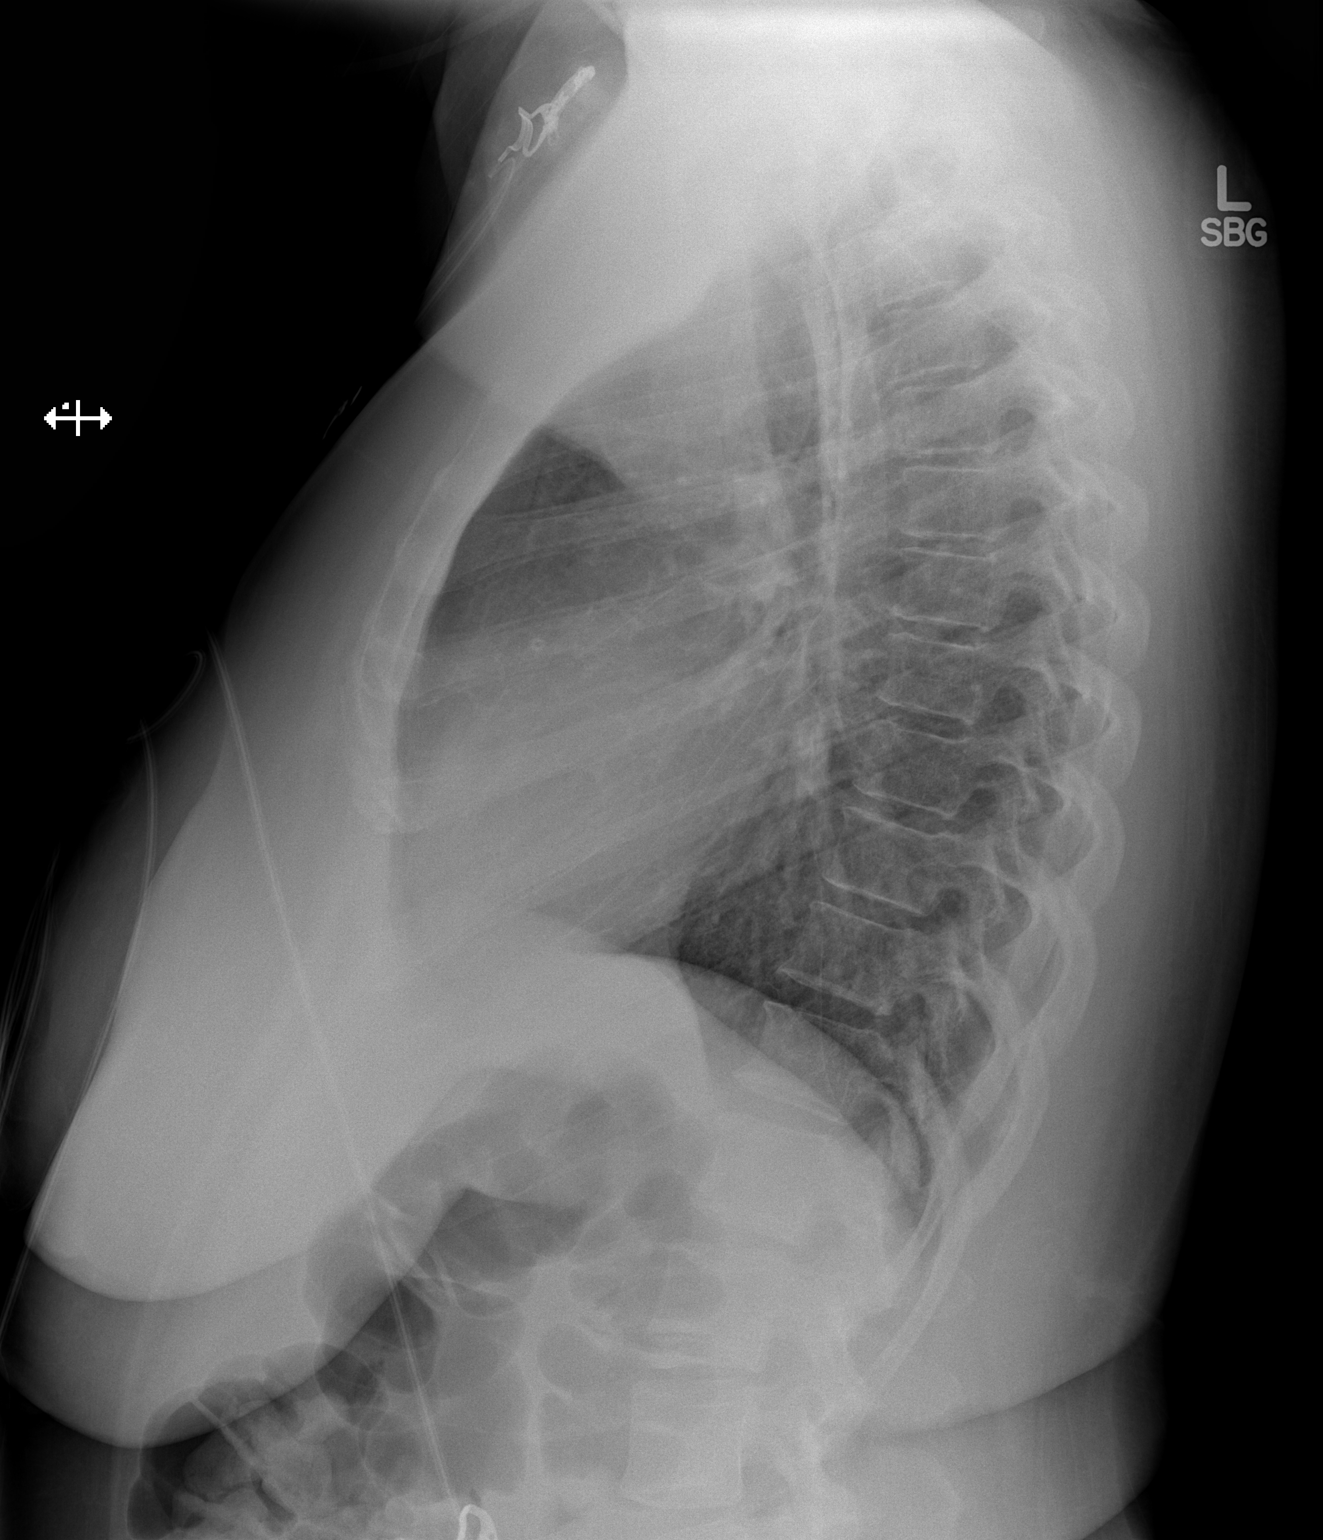

[2 of 2 positions shown; findings below may reference images not displayed]

FINDINGS: The heart size and mediastinal contours are within normal limits.
Both lungs are clear. The visualized skeletal structures are
unremarkable.
IMPRESSION: No active cardiopulmonary disease.

## 2017-01-03 IMAGING — CR DG CHEST 1V PORT
1 series · 1 of 1 positions shown · non-contrast
Comparison: 02/07/2015.

CLINICAL DATA: Cardiogenic postoperative shock. Balloon pump
adjustment.

EXAM:
PORTABLE CHEST 1 VIEW

[AP]
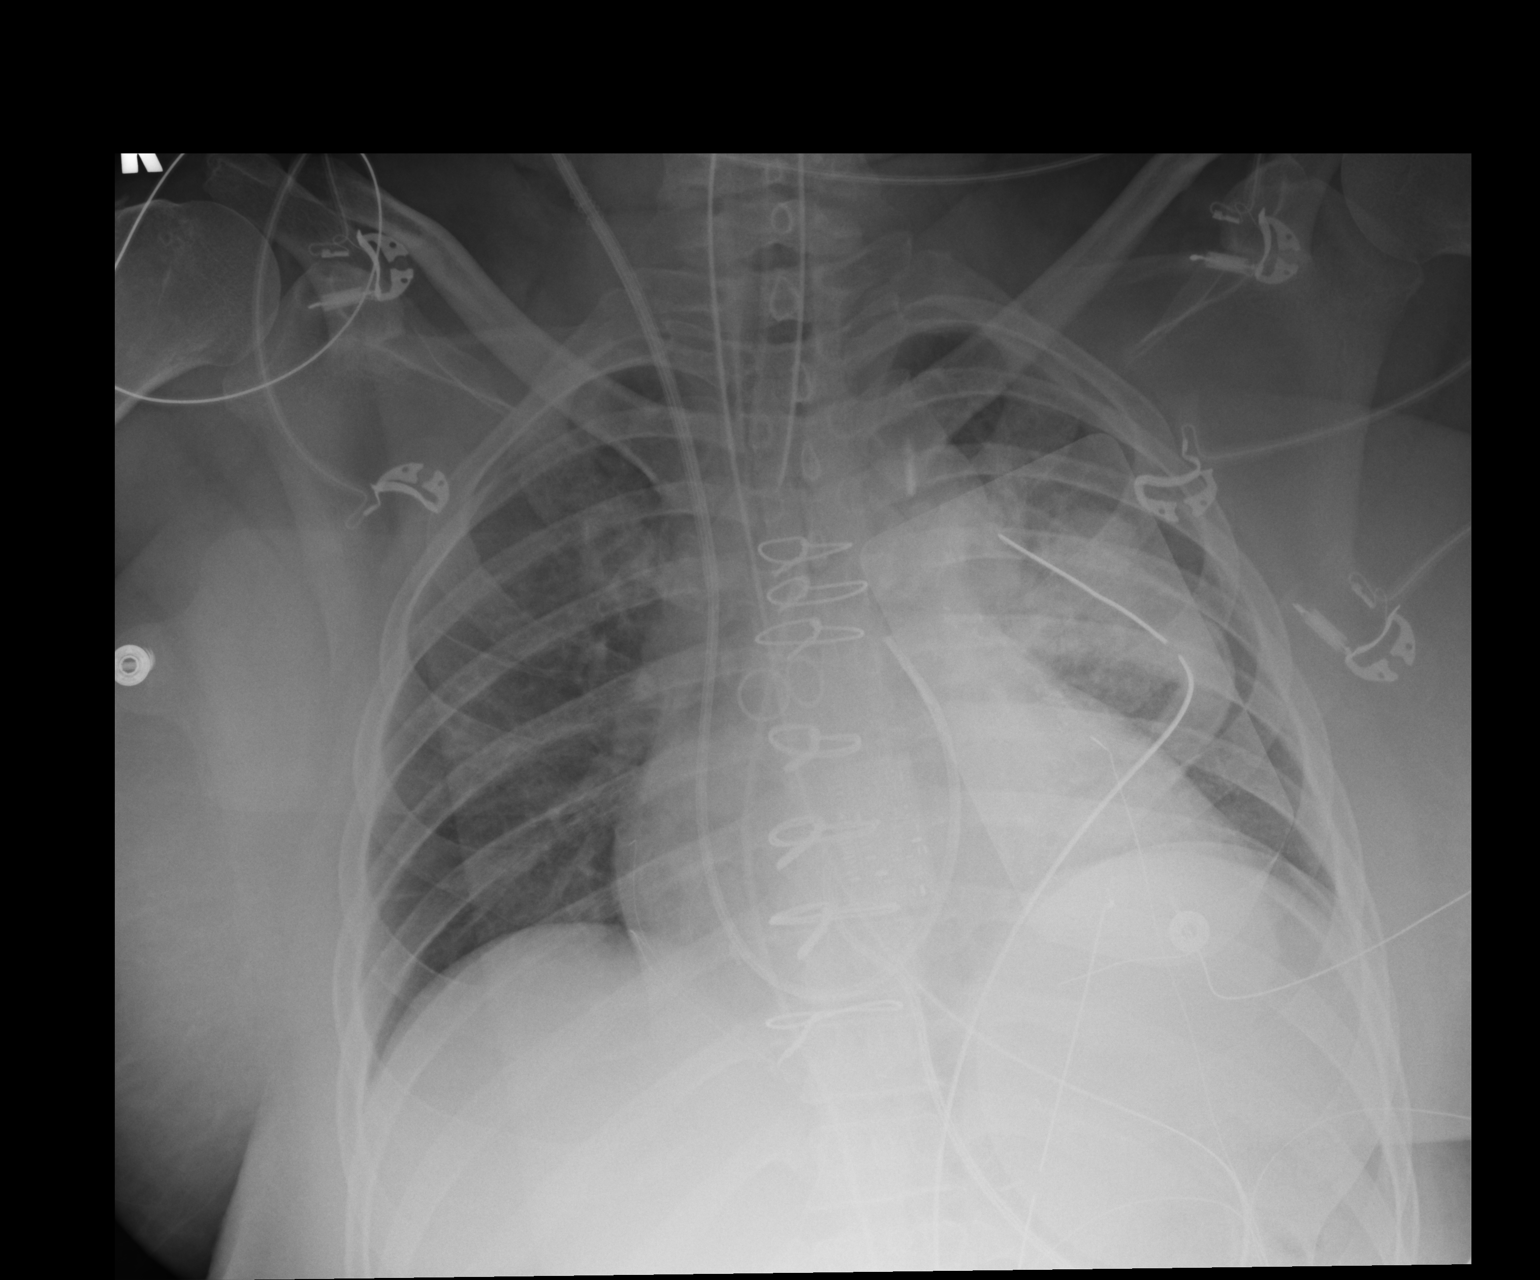

[1 of 1 positions shown; findings below may reference images not displayed]

FINDINGS: 1878 hours. The endotracheal tube tip is approximately 2 cm above
the carina. Nasogastric tube projects below the diaphragm. Right IJ
Swan-Ganz catheter tip unchanged in the main pulmonary artery. The
marker for an intra aortic balloon pump is noted at the level of the
aortic arch. Mediastinal drain and left chest tubes are in place.

There are slightly lower lung volumes with mildly increased
perihilar opacity on the left. No pneumothorax or significant
pleural effusion identified.
IMPRESSION: Support system positioned as above. Mild increase in perihilar
atelectasis on the left. No pneumothorax.

## 2017-01-03 IMAGING — CR DG CHEST 1V PORT
1 series · 1 of 1 positions shown · non-contrast
Comparison: Film from earlier in the same day

CLINICAL DATA: Status post intra-aortic balloon pump placement

EXAM:
PORTABLE CHEST - 1 VIEW

[AP]
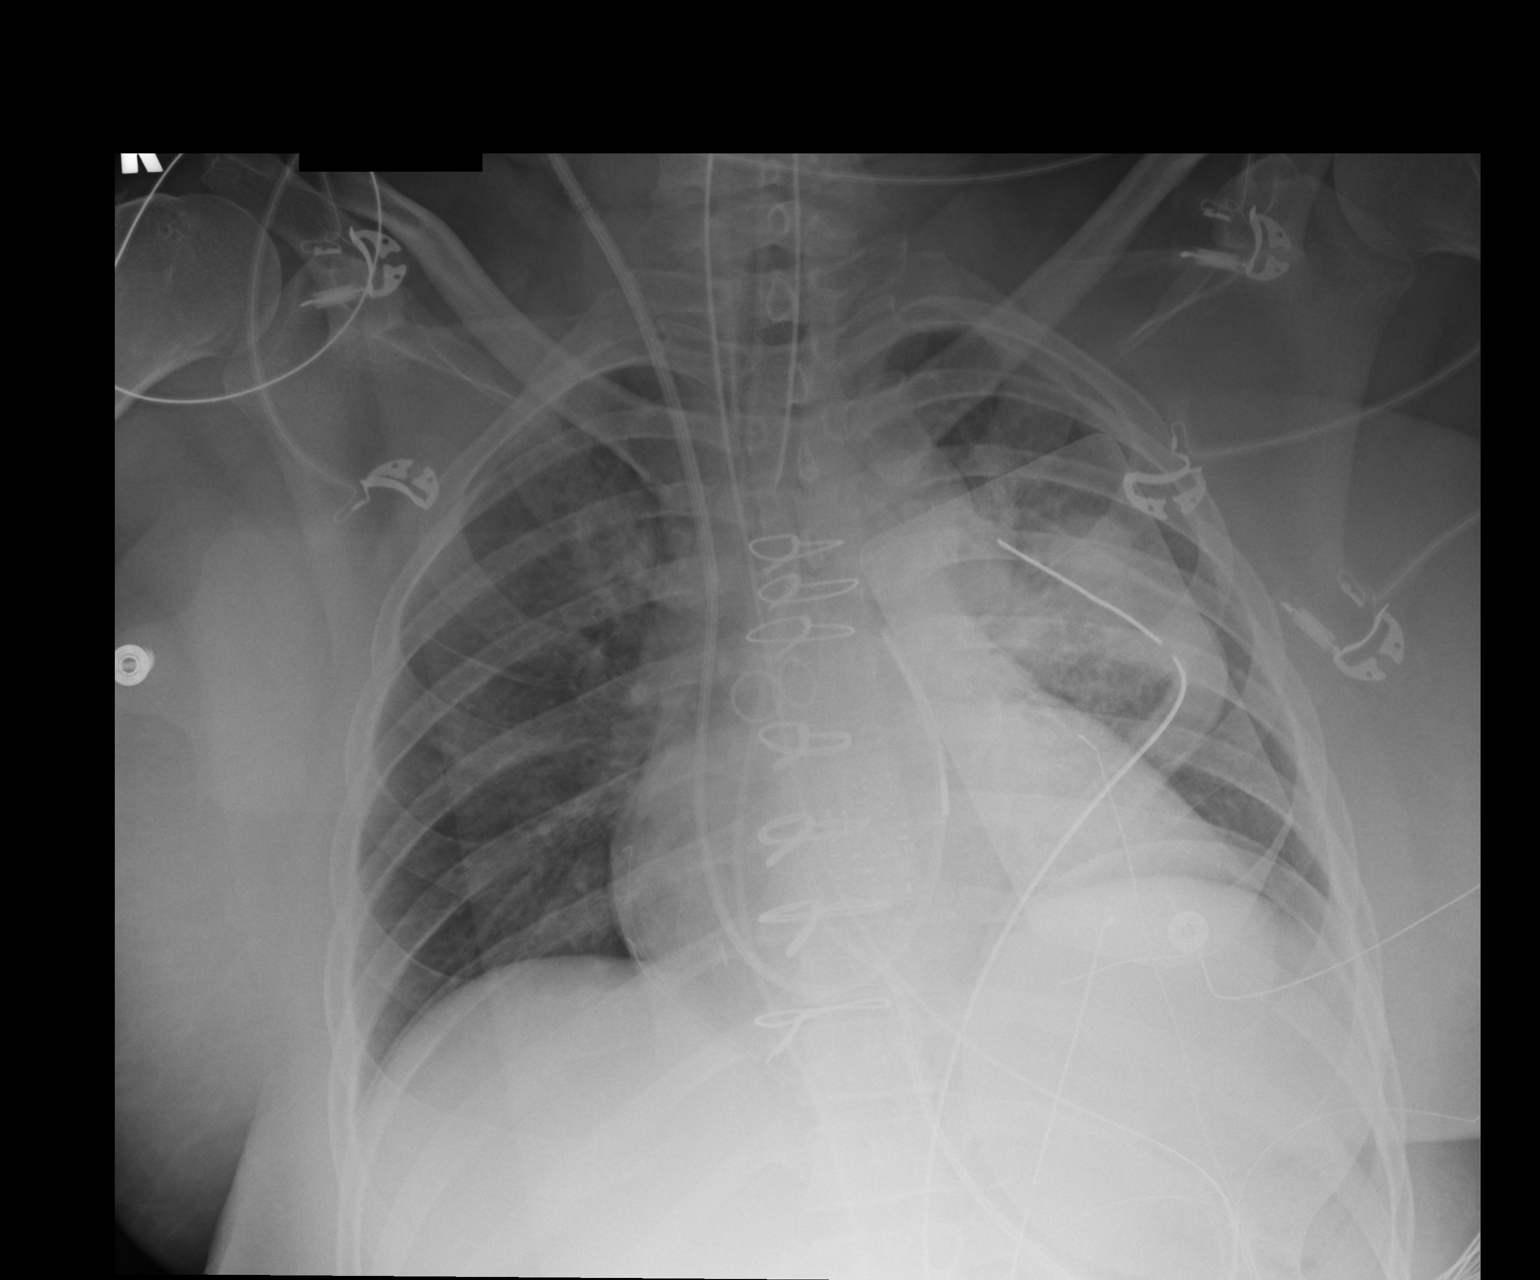

[1 of 1 positions shown; findings below may reference images not displayed]

FINDINGS: Cardiac shadow is stable. Postoperative changes are again seen. The
endotracheal tube, nasogastric catheter, mediastinal drain,
Swan-Ganz catheter and left thoracostomy catheter are stable. A a
new intra-aortic balloon pump is noted with the radial P tip at the
T8 level below the aortic knob. Patchy atelectatic changes are again
noted in the left lung.
IMPRESSION: Intra-aortic balloon pump at the T8 level as described. Patchy left
midlung atelectasis is noted.

## 2017-01-03 IMAGING — CR DG CHEST 1V PORT
1 series · 1 of 1 positions shown · non-contrast
Comparison: Film from earlier in the same day

CLINICAL DATA: Status post coronary bypass grafting

EXAM:
PORTABLE CHEST - 1 VIEW

[AP]
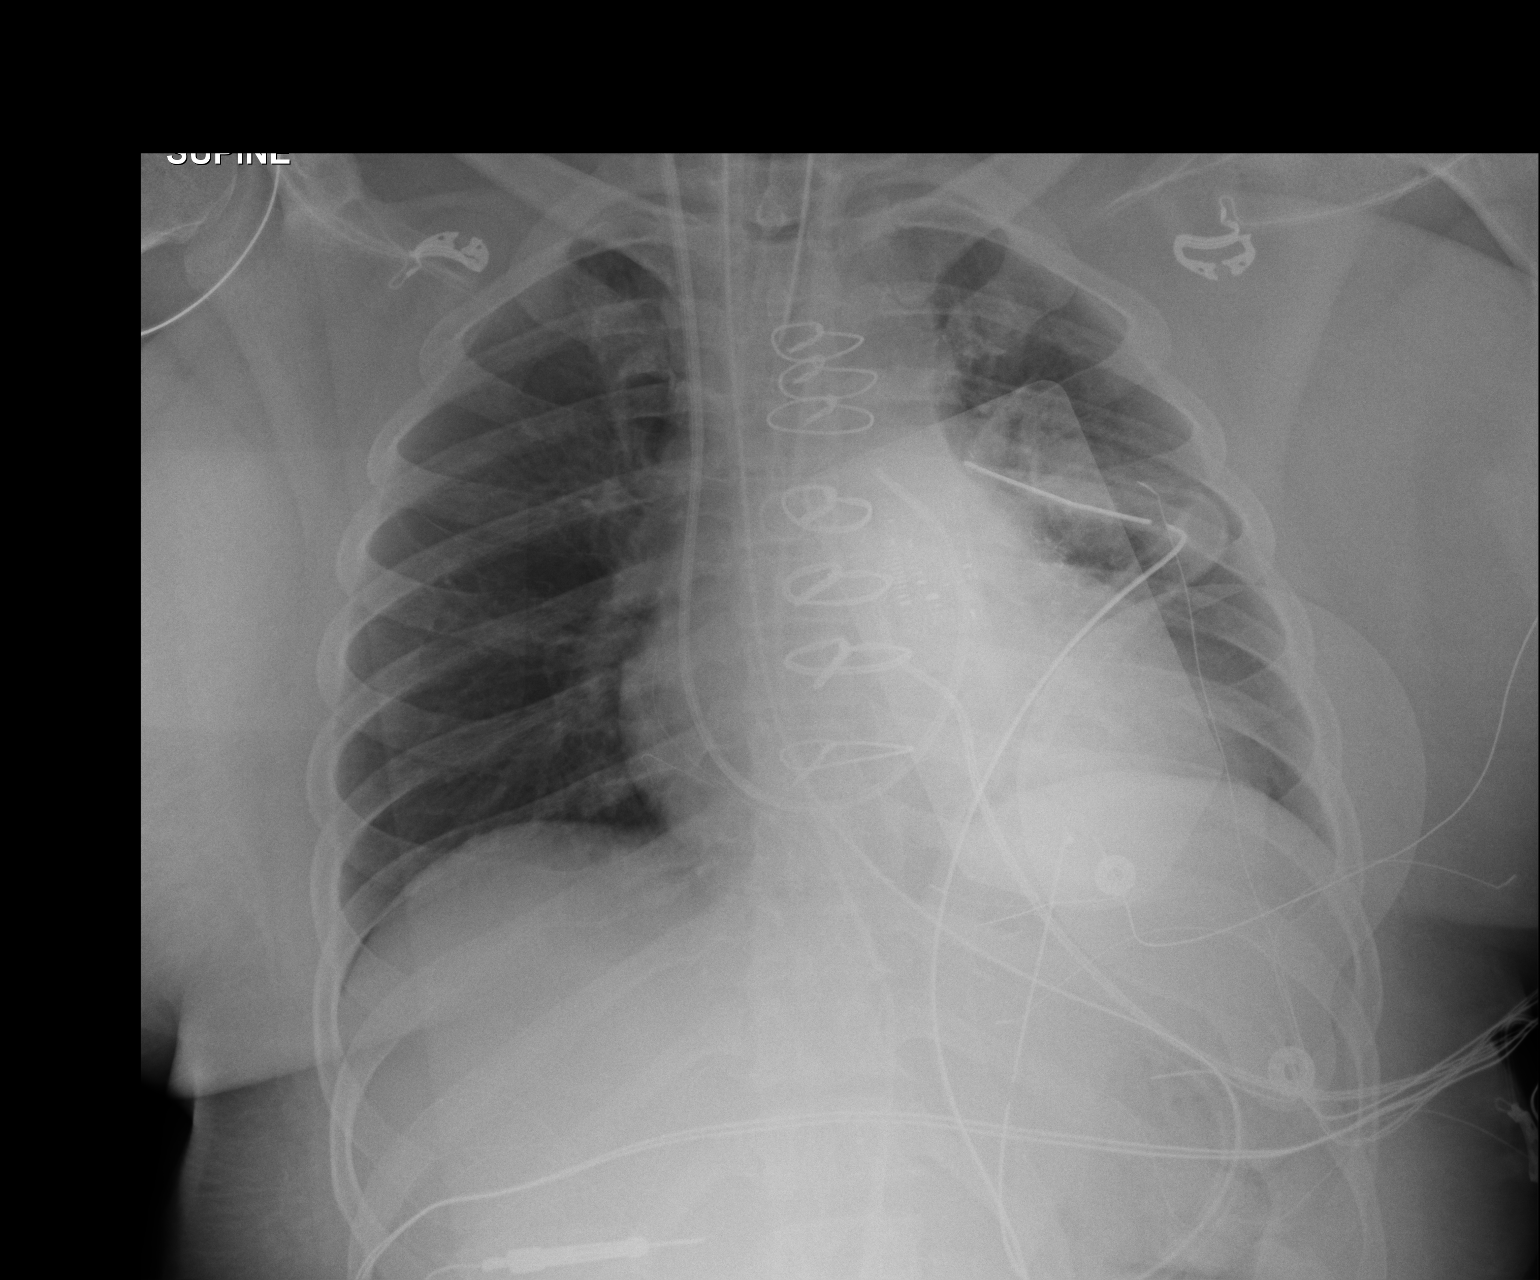

[1 of 1 positions shown; findings below may reference images not displayed]

FINDINGS: Cardiac shadow is mildly prominent. This may be technically related.
An endotracheal tube, Swan-Ganz catheter, nasogastric catheter,
mediastinal drain and left thoracostomy catheter are again seen and
stable. The lungs are well aerated bilaterally. No focal infiltrate
is seen. Some improvement in the left midlung atelectasis is noted.
No pneumothorax is noted.
IMPRESSION: Mild prominence of the cardiac shadow although this may be related
to technical factors of the film.

Tubes and lines as described relatively stable from the prior exam.

Some improvement in the left mid lung atelectasis. No new focal
abnormality is seen.

## 2017-01-26 ENCOUNTER — Ambulatory Visit (HOSPITAL_COMMUNITY)
Admission: RE | Admit: 2017-01-26 | Discharge: 2017-01-26 | Disposition: A | Discharge status: Home / Self Care | Payer: Self-pay | Source: Ambulatory Visit | Attending: Cardiovascular Disease | Admitting: Cardiovascular Disease

## 2017-01-26 DIAGNOSIS — I739 Peripheral vascular disease, unspecified: Secondary | ICD-10-CM

## 2017-01-26 DIAGNOSIS — I6523 Occlusion and stenosis of bilateral carotid arteries: Secondary | ICD-10-CM | POA: Insufficient documentation

## 2017-01-26 DIAGNOSIS — I779 Disorder of arteries and arterioles, unspecified: Secondary | ICD-10-CM

## 2017-02-01 ENCOUNTER — Other Ambulatory Visit: Payer: Self-pay | Admitting: Cardiovascular Disease

## 2017-02-01 DIAGNOSIS — I779 Disorder of arteries and arterioles, unspecified: Secondary | ICD-10-CM

## 2017-02-01 DIAGNOSIS — I739 Peripheral vascular disease, unspecified: Principal | ICD-10-CM

## 2017-02-08 ENCOUNTER — Telehealth: Payer: Self-pay | Admitting: Cardiovascular Disease

## 2017-02-08 MED ORDER — METOPROLOL TARTRATE 50 MG PO TABS: 75.0000 mg | ORAL_TABLET | Freq: Two times a day (BID) | ORAL | 11 refills | Status: DC

## 2017-02-08 NOTE — Telephone Encounter (Signed)
Talked to Mrs Clinkenbeard today by phone.  Only 1 isolated incident of BP >200 noted but other readings remains above goal BP of <130/80. Denies increase salt intake, edema or weight gain. No dizziness, headaches or chest pain either.   Recommendation: 1. Increase metoprolol to  twice daily 2. Monitor BP and HR twice daily and keep records to bring to next f/u visit in 3 weeks. 3. Visit urgent care/Emergency room if SBP>200 and not improved in 20 minutes or if any symptoms accompany the elevated readings. 4. Call HTN clinic at [phone] (Brandon Scarbrough/Kristin) for any questions related BP and/or medication management.

## 2017-02-08 NOTE — Telephone Encounter (Signed)
Pt c/o BP issue: STAT if pt c/o blurred vision, one-sided weakness or slurred speech  1. What are your last 5 BP readings? 218/108  168/93158/92  2. Are you having any other symptoms (ex. Dizziness, headache, blurred vision, passed out)? Slight headache 3. What is your BP issue? High

## 2017-02-08 NOTE — Telephone Encounter (Signed)
Returned call to patient of Dr. Allyson Sabal. She reports her home BP has been elevated for about 1.5 weeks. Only BP medication is metoprolol tartrate  BID. She has been experiencing a "pinch in her chest" on the left side for about 2 months, pain in fleeting.   She has an MD OV 03/04/17  Her HR ranges from 70s-80s bpm 218/108 168/93 158/92 151/83   Advised would defer to MD & pharmacy staff for recommendations

## 2017-03-04 ENCOUNTER — Ambulatory Visit: Payer: Self-pay | Admitting: Physician Assistant

## 2017-03-08 ENCOUNTER — Ambulatory Visit (INDEPENDENT_AMBULATORY_CARE_PROVIDER_SITE_OTHER): Payer: Self-pay | Admitting: Physician Assistant

## 2017-03-08 VITALS — BP 150/90 | HR 87 | Ht 63.0 in | Wt 188.0 lb

## 2017-03-08 DIAGNOSIS — I779 Disorder of arteries and arterioles, unspecified: Secondary | ICD-10-CM

## 2017-03-08 DIAGNOSIS — I25810 Atherosclerosis of coronary artery bypass graft(s) without angina pectoris: Secondary | ICD-10-CM

## 2017-03-08 DIAGNOSIS — E785 Hyperlipidemia, unspecified: Secondary | ICD-10-CM

## 2017-03-08 DIAGNOSIS — Z794 Long term (current) use of insulin: Secondary | ICD-10-CM

## 2017-03-08 DIAGNOSIS — I739 Peripheral vascular disease, unspecified: Secondary | ICD-10-CM

## 2017-03-08 DIAGNOSIS — E119 Type 2 diabetes mellitus without complications: Secondary | ICD-10-CM

## 2017-03-08 DIAGNOSIS — I10 Essential (primary) hypertension: Secondary | ICD-10-CM

## 2017-03-08 MED ORDER — AMLODIPINE BESYLATE 5 MG PO TABS: 5.0000 mg | ORAL_TABLET | Freq: Every day | ORAL | 3 refills | Status: DC

## 2017-03-08 MED ORDER — METOPROLOL TARTRATE 50 MG PO TABS: 75.0000 mg | ORAL_TABLET | Freq: Two times a day (BID) | ORAL | 3 refills | Status: DC

## 2017-03-08 NOTE — Progress Notes (Signed)
Cardiology Office Note    Date:  03/10/2017   ID:  [name], DOB 11/07/1970, MRN [id number]  PCP:  Fleet ContrasAvbuere, Edwin, MD  Cardiologist:  Dr. Allyson SabalBerry   Chief Complaint  Patient presents with  . Follow-up    sob, PINCH/PAIN IN CHEST FOR 1 MONTH, HIGH BP    History of Present Illness:  [name] is a 46 y.o. female with PMH of HTN, HLD, DM II, carotid artery disease, and CAD. She had RCA intervention in the past with re-intervention on 09/07/2014. She underwent another cardiac catheterization on 02/01/2015 revealing three-vessel disease with moderate LV dysfunction. She eventually underwent CABG 3 by Dr. Donata ClayVan Trigt on 02/07/2015 with LIMA to LAD, SVG to OM, SVG to PDA. Several days postop, she had a PEA arrest requiring resuscitation, intubation and placement of intra-aortic balloon pump. She was eventually discharged home and recuperated well. Echocardiogram obtained on 08/19/2015 showed EF 45-50%. Last carotid ultrasound obtained on 01/26/2017 showed 1-39 bilateral ICA stenosis.  Based on telephone note on 02/08/2017, patient called regarding a single isolated incident where her systolic blood pressure went above 200. Most of the time, her blood pressure was in the 130s. Her metoprolol was increased to 75 mg twice a day. She presents today for cardiology office visit, she denies any shortness of breath. She has occasional chest discomfort, however they are not related to exertion and appears to be very transient. Her blood pressure continued to be elevated. I originally planned to add amlodipine, however due to the cost of amlodipine, I later switched her to lisinopril 5 mg daily.   Past Medical History:  Diagnosis Date  . CAD (coronary artery disease)    cath 09/07/2014 DES to 95% prox RCA, residual 70-80% origin of AV groove, 90% ostial PDA stenosis; 01/2015, NSTEMI w/ CABG, LIMA-LAD, SVG-OM1, SVG-PCA  . Carotid artery disease (HCC)    a. 40-59% BICA by duplex 01/2015.  . Diabetes  mellitus without complication (HCC)   . H/O medication noncompliance   . Hyperlipidemia   . Hypertension   . MI (myocardial infarction) (HCC)   . Tobacco abuse     Past Surgical History:  Procedure Laterality Date  . ABDOMINAL HYSTERECTOMY    . CARDIAC CATHETERIZATION N/A 02/01/2015   Procedure: Left Heart Cath and Coronary Angiography;  Surgeon: Runell GessJonathan J Berry, MD;  [facility];  Service: Cardiovascular;  Laterality: N/A;  . CARDIAC CATHETERIZATION N/A 02/07/2015   Procedure: IABP Insertion;  Surgeon: Dolores Pattyaniel R Bensimhon, MD;  [facility];  Service: Cardiovascular;  Laterality: N/A;  . CORONARY ARTERY BYPASS GRAFT N/A 02/07/2015   Procedure: CORONARY ARTERY BYPASS GRAFTING (CABG);  Surgeon: Kerin PernaPeter Van Trigt, MD; LIMA-LAD, SVT-OM1, SVG-PDA  . LEFT HEART CATHETERIZATION WITH CORONARY ANGIOGRAM N/A 05/29/2014   Procedure: LEFT HEART CATHETERIZATION WITH CORONARY ANGIOGRAM;  Surgeon: Corky CraftsJayadeep S Varanasi, MD;  [facility];  Service: Cardiovascular;  Laterality: N/A;  . LEFT HEART CATHETERIZATION WITH CORONARY ANGIOGRAM N/A 09/07/2014   Procedure: LEFT HEART CATHETERIZATION WITH CORONARY ANGIOGRAM;  Surgeon: Marykay Lexavid W Harding, MD;  [facility];  Service: Cardiovascular;  Laterality: N/A;  . PERCUTANEOUS CORONARY STENT INTERVENTION (PCI-S)  05/29/2014   Procedure: PERCUTANEOUS CORONARY STENT INTERVENTION (PCI-S);  Surgeon: Corky CraftsJayadeep S Varanasi, MD;  [facility];  Service: Cardiovascular;;  . TEE WITHOUT CARDIOVERSION N/A 02/07/2015   Procedure: TRANSESOPHAGEAL ECHOCARDIOGRAM (TEE);  Surgeon: Kerin PernaPeter Van Trigt, MD;  [facility];  Service: Open Heart Surgery;  Laterality: N/A;    Current Medications: Outpatient Medications Prior to Visit  Medication Sig Dispense Refill  . acetaminophen (TYLENOL) 500 MG tablet Take 500 mg by mouth every 6 (six) hours as needed.    Marland Kitchen aspirin EC 81 MG tablet Take 81 mg by mouth daily.    . insulin glargine  (LANTUS) 100 UNIT/ML injection Inject 20 Units into the skin at bedtime.     . lovastatin (MEVACOR) 20 MG tablet Take 1 tablet (20 mg total) by mouth at bedtime. 30 tablet 11  . metFORMIN (GLUCOPHAGE) 500 MG tablet Take 1 tablet (500 mg total) by mouth 2 (two) times daily with a meal. Pt will need to establish care with PCP for next refills. 60 tablet 0  . Omega-3 Fatty Acids (FISH OIL) 1000 MG CAPS Take 1,000 mg by mouth at bedtime.    . ondansetron (ZOFRAN) 4 MG tablet Take 1 tablet (4 mg total) by mouth every 6 (six) hours. Prn n/v. 12 tablet 0  . metoprolol tartrate (LOPRESSOR) 50 MG tablet Take 1.5 tablets (75 mg total) by mouth 2 (two) times daily. 60 tablet 11   No facility-administered medications prior to visit.      Allergies:   Heparin   Social History   Social History  . Marital status: Single    Spouse name: N/A  . Number of children: 2  . Years of education: N/A   Occupational History  . CNA [employer]   Social History Main Topics  . Smoking status: Former Smoker    Packs/day: 0.00    Types: Cigarettes    Quit date: 01/10/2015  . Smokeless tobacco: Never Used  . Alcohol use No  . Drug use: No  . Sexual activity: Not Asked   Other Topics Concern  . None   Social History Narrative  . None     Family History:  The patient's family history includes Heart attack (age of onset: 55) in her father; Heart disease in her father; Hypertension in her mother; Kidney failure in her mother; Stroke (age of onset: 44) in her mother.   ROS:   Please see the history of present illness.    ROS All other systems reviewed and are negative.   PHYSICAL EXAM:   VS:  BP (!) 150/90   Pulse 87   Ht 5\' 3"  (1.6 m)   Wt 188 lb (85.3 kg)   BMI 33.30 kg/m    GEN: Well nourished, well developed, in no acute distress  HEENT: normal  Neck: no JVD, carotid bruits, or masses Cardiac: RRR; no murmurs, rubs, or gallops,no edema  Respiratory:  clear to auscultation  bilaterally, normal work of breathing GI: soft, nontender, nondistended, + BS MS: no deformity or atrophy  Skin: warm and dry, no rash Neuro:  Alert and Oriented x 3, Strength and sensation are intact Psych: euthymic mood, full affect  Wt Readings from Last 3 Encounters:  03/08/17 188 lb (85.3 kg)  10/07/16 189 lb 9.6 oz (86 kg)  02/18/16 187 lb 3.2 oz (84.9 kg)      Studies/Labs Reviewed:   EKG:  EKG is ordered today.  The ekg ordered today demonstrates  Normal sinus rhythm, T-wave inversion in lead 1 and aVL. No significant change compared to previous EKG  Recent Labs: No results found for requested labs within last 8760 hours.   Lipid Panel    Component Value Date/Time   CHOL 96 02/03/2015 0235   TRIG 88 02/03/2015 0235  HDL 32 (L) 02/03/2015 0235   CHOLHDL 3.0 02/03/2015 0235   VLDL 18 02/03/2015 0235   LDLCALC 46 02/03/2015 0235    Additional studies/ records that were reviewed today include:   Cath 02/01/2015 Conclusion    Ost RPDA to RPDA lesion, 95% stenosed.  Ost Cx to Prox Cx lesion, 99% stenosed.  Ost LAD to Prox LAD lesion, 70% stenosed.  There is moderate left ventricular systolic dysfunction       Echo 08/19/2015 LV EF: 45% -   50%  Study Conclusions  - Left ventricle: The cavity size was normal. Wall thickness was   normal. Systolic function was mildly reduced. The estimated   ejection fraction was in the range of 45% to 50%. Doppler   parameters are consistent with abnormal left ventricular   relaxation (grade 1 diastolic dysfunction). - Aortic valve: Mildly calcified annulus. - Right ventricle: The cavity size was mildly dilated. Systolic   function was mildly reduced. - Pulmonary arteries: Systolic pressure was mildly increased. PA   peak pressure: 32 mm Hg (S).     Carotid US 01/26/2017 Impressions Heterogeneous plaque, bilaterally. 1-39% bilateral ICA stenosis. Unable to get elevated velocities from prior study. Normal  subclavian arteries, bilaterally. Patent vertebral arteries with antegrade flow.   ASSESSMENT:    1. Coronary artery disease involving coronary bypass graft of native heart without angina pectoris   2. Essential hypertension   3. Carotid artery disease, unspecified laterality, unspecified type (HCC)   4. Hyperlipidemia, unspecified hyperlipidemia type   5. Controlled type 2 diabetes mellitus without complication, with long-term current use of insulin (HCC)      PLAN:  In order of problems listed above:  1. CAD s/p CABG: Denies any obvious angina. Chest discomfort fairly atypical, appears to be very transient. Will continue observation  2. Hypertension: Blood pressure has been elevated, I originally added 5 mg amlodipine to her medication list, however amlodipine caused $60 for her, I have since transitioned her to lisinopril.  3. Carotid artery disease: Last carotid Doppler obtained on 01/26/2017 showed a minimal disease bilaterally, recommended one year repeat  4. Hyperlipidemia: Continue lovastatin, annual lab work obtained by primary care provider.  5. DM II: Managed by primary care provider    Medication Adjustments/Labs and Tests Ordered: Current medicines are reviewed at length with the patient today.  Concerns regarding medicines are outlined above.  Medication changes, Labs and Tests ordered today are listed in the Patient Instructions below. Patient Instructions  Medication Instructions:  START- Amlodipine 5 mg daily   If you need a refill on your cardiac medications before your next appointment, please call your pharmacy.  Labwork: None Ordered  Testing/Procedures: None Ordered   Follow-Up: Your physician wants you to follow-up in: 1 Months with Azalee Course.    Thank you for choosing [facility] at [employer], [place], [place]  03/10/2017 2:21 PM    [facility] [address], [place], [place]  [address] Phone:  [phone]; Fax: [phone]

## 2017-03-08 NOTE — Patient Instructions (Signed)
Medication Instructions:  START- Amlodipine 5 mg daily   If you need a refill on your cardiac medications before your next appointment, please call your pharmacy.  Labwork: None Ordered  Testing/Procedures: None Ordered   Follow-Up: Your physician wants you to follow-up in: 1 Months with [name].    Thank you for choosing [facility]!!

## 2017-03-09 ENCOUNTER — Telehealth: Payer: Self-pay | Admitting: Physician Assistant

## 2017-03-09 DIAGNOSIS — Z79899 Other long term (current) drug therapy: Secondary | ICD-10-CM

## 2017-03-09 MED ORDER — LISINOPRIL 5 MG PO TABS: 5.0000 mg | ORAL_TABLET | Freq: Every day | ORAL | 11 refills | Status: DC

## 2017-03-09 NOTE — Telephone Encounter (Signed)
Per pt she needs another medication for her BP the one prescribed is to expensive.   Please give her a call.

## 2017-03-09 NOTE — Telephone Encounter (Signed)
Patient aware of recommendations and verbalized understanding.  Rx sent to pharmacy and lab order placed.   

## 2017-03-09 NOTE — Telephone Encounter (Signed)
Returned call to patient who states that she cannot afford the new medication that was prescribed for her.  Reports amlodipine is going to be $60/month and she verified that this was for the generic.    Advised I would route for review/other recommendations.

## 2017-03-09 NOTE — Telephone Encounter (Signed)
I would recommend stop the amlodipine and restart lisinopril at 5mg . Patient was on lisinopril for majority of 2016, somehow this medication was stopped by 2017. No clear reason was given, lab including kidney function and potassium was normal at the time. I would like to restart lisinopril at 5mg  daily. BMET in 1-2 weeks to make sure no electrolyte shift.

## 2017-03-10 ENCOUNTER — Encounter: Payer: Self-pay | Admitting: Physician Assistant

## 2017-03-17 ENCOUNTER — Other Ambulatory Visit: Payer: Self-pay | Admitting: *Deleted

## 2017-03-17 ENCOUNTER — Telehealth: Payer: Self-pay | Admitting: Cardiovascular Disease

## 2017-03-17 MED ORDER — LOVASTATIN 20 MG PO TABS: 20.0000 mg | ORAL_TABLET | Freq: Every day | ORAL | 11 refills | Status: DC

## 2017-03-17 NOTE — Telephone Encounter (Signed)
°*  STAT* If patient is at the pharmacy, call can be transferred to refill team.   1. Which medications need to be refilled? (please list name of each medication and dose if known) Lovastatin 20 mg  2. Which pharmacy/location (including street and city if local pharmacy) is medication to be sent to?[facility] [address]- [place], [place] - [address]  3. Do they need a 30 day or 90 day supply? 30

## 2017-04-13 ENCOUNTER — Encounter: Payer: Self-pay | Admitting: Physician Assistant

## 2017-04-13 ENCOUNTER — Ambulatory Visit (INDEPENDENT_AMBULATORY_CARE_PROVIDER_SITE_OTHER): Payer: Self-pay | Admitting: Physician Assistant

## 2017-04-13 VITALS — BP 169/88 | HR 64 | Ht 63.0 in | Wt 186.6 lb

## 2017-04-13 DIAGNOSIS — E785 Hyperlipidemia, unspecified: Secondary | ICD-10-CM

## 2017-04-13 DIAGNOSIS — E119 Type 2 diabetes mellitus without complications: Secondary | ICD-10-CM

## 2017-04-13 DIAGNOSIS — Z79899 Other long term (current) drug therapy: Secondary | ICD-10-CM

## 2017-04-13 DIAGNOSIS — I779 Disorder of arteries and arterioles, unspecified: Secondary | ICD-10-CM

## 2017-04-13 DIAGNOSIS — I10 Essential (primary) hypertension: Secondary | ICD-10-CM

## 2017-04-13 DIAGNOSIS — I739 Peripheral vascular disease, unspecified: Secondary | ICD-10-CM

## 2017-04-13 DIAGNOSIS — I25810 Atherosclerosis of coronary artery bypass graft(s) without angina pectoris: Secondary | ICD-10-CM

## 2017-04-13 LAB — BASIC METABOLIC PANEL
BUN/Creatinine Ratio: 16 (ref 9–23)
BUN: 15 mg/dL (ref 6–24)
CALCIUM: 9.9 mg/dL (ref 8.7–10.2)
CO2: 22 mmol/L (ref 20–29)
CREATININE: 0.94 mg/dL (ref 0.57–1.00)
Chloride: 101 mmol/L (ref 96–106)
GFR calc Af Amer: 85 mL/min/{1.73_m2} (ref >59)
GFR, EST NON AFRICAN AMERICAN: 74 mL/min/{1.73_m2} (ref >59)
Glucose: 116 mg/dL — ABNORMAL HIGH (ref 65–99)
POTASSIUM: 4.5 mmol/L (ref 3.5–5.2)
Sodium: 139 mmol/L (ref 134–144)

## 2017-04-13 MED ORDER — LISINOPRIL 20 MG PO TABS: 10.0000 mg | ORAL_TABLET | Freq: Every day | ORAL | 5 refills | Status: DC

## 2017-04-13 NOTE — Progress Notes (Signed)
Cardiology Office Note    Date:  04/14/2017   ID:  KYAH BUESING, DOB [birth year], MRN [id number]  PCP:  Fleet Contras, MD  Cardiologist:  Dr. Allyson Sabal  Chief Complaint  Patient presents with  . Follow-up    1 month; seen for Dr. Allyson Sabal    History of Present Illness:  [name] is a 46 y.o. female  with PMH of HTN, HLD, DM II, carotid artery disease, and CAD. She had RCA intervention in the past with re-intervention on 09/07/2014. She underwent another cardiac catheterization on 02/01/2015 revealing three-vessel disease with moderate LV dysfunction. She eventually underwent CABG 3 by Dr. Donata Clay on 02/07/2015 with LIMA to LAD, SVG to OM, SVG to PDA. Several days postop, she had a PEA arrest requiring resuscitation, intubation and placement of intra-aortic balloon pump. She was eventually discharged home and recuperated well. Echocardiogram obtained on 08/19/2015 showed EF 45-50%. Last carotid ultrasound obtained on 01/26/2017 showed 1-39 bilateral ICA stenosis.  I last saw the patient on 03/08/2017, she contacted [place] based on a single elevation of blood pressure at the time.  Most of the time, her blood pressure was in the 130s.  Her metoprolol was increased to 75 mg twice a day.  During the last office visit, I initially attempted to add amlodipine, however due to cost, I switched her to lisinopril 5 mg daily.  Her blood pressure remain elevated today, she also has a headache.  I will increase her lisinopril to 10 mg daily.  According to the patient, her systolic blood pressure usually running in the 140s at home.  I will give her the 20 mg tablets, if her systolic blood pressure still greater than 120s after 2 weeks, she has been instructed to go up to 20 mg daily of lisinopril.  I will obtain basic metabolic panel today to make sure there is no electrolyte shift after adding lisinopril.   Past Medical History:  Diagnosis Date  . CAD (coronary artery disease)    cath 09/07/2014 DES to  95% prox RCA, residual 70-80% origin of AV groove, 90% ostial PDA stenosis; 01/2015, NSTEMI w/ CABG, LIMA-LAD, SVG-OM1, SVG-PCA  . Carotid artery disease (HCC)    a. 40-59% BICA by duplex 01/2015.  . Diabetes mellitus without complication (HCC)   . H/O medication noncompliance   . Hyperlipidemia   . Hypertension   . MI (myocardial infarction) (HCC)   . Tobacco abuse     Past Surgical History:  Procedure Laterality Date  . ABDOMINAL HYSTERECTOMY    . CARDIAC CATHETERIZATION N/A 02/01/2015   Procedure: Left Heart Cath and Coronary Angiography;  Surgeon: Runell Gess, MD;  Location: [facility] INVASIVE CV LAB;  Service: Cardiovascular;  Laterality: N/A;  . CARDIAC CATHETERIZATION N/A 02/07/2015   Procedure: IABP Insertion;  Surgeon: Dolores Patty, MD;  [facility]: [facility] CV LAB;  Service: Cardiovascular;  Laterality: N/A;  . CORONARY ARTERY BYPASS GRAFT N/A 02/07/2015   Procedure: CORONARY ARTERY BYPASS GRAFTING (CABG);  Surgeon: Kerin Perna, MD; LIMA-LAD, SVT-OM1, SVG-PDA  . LEFT HEART CATHETERIZATION WITH CORONARY ANGIOGRAM N/A 05/29/2014   Procedure: LEFT HEART CATHETERIZATION WITH CORONARY ANGIOGRAM;  Surgeon: Corky Crafts, MD;  Location: [facility] CATH LAB;  Service: Cardiovascular;  Laterality: N/A;  . LEFT HEART CATHETERIZATION WITH CORONARY ANGIOGRAM N/A 09/07/2014   Procedure: LEFT HEART CATHETERIZATION WITH CORONARY ANGIOGRAM;  Surgeon: Marykay Lex, MD;  Location: [facility] CATH LAB;  Service: Cardiovascular;  Laterality: N/A;  . PERCUTANEOUS CORONARY STENT  INTERVENTION (PCI-S)  05/29/2014   Procedure: PERCUTANEOUS CORONARY STENT INTERVENTION (PCI-S);  Surgeon: Corky CraftsJayadeep S Varanasi, MD;  [facility];  Service: Cardiovascular;;  . TEE WITHOUT CARDIOVERSION N/A 02/07/2015   Procedure: TRANSESOPHAGEAL ECHOCARDIOGRAM (TEE);  Surgeon: Kerin PernaPeter Van Trigt, MD;  [facility];  Service: Open Heart Surgery;  Laterality: N/A;    Current Medications: Outpatient Medications Prior to  Visit  Medication Sig Dispense Refill  . acetaminophen (TYLENOL) 500 MG tablet Take 500 mg by mouth every 6 (six) hours as needed.    Marland Kitchen. aspirin EC 81 MG tablet Take 81 mg by mouth daily.    . insulin glargine (LANTUS) 100 UNIT/ML injection Inject 20 Units into the skin at bedtime.     . lovastatin (MEVACOR) 20 MG tablet Take 1 tablet (20 mg total) at bedtime by mouth. 30 tablet 11  . metFORMIN (GLUCOPHAGE) 500 MG tablet Take 1 tablet (500 mg total) by mouth 2 (two) times daily with a meal. Pt will need to establish care with PCP for next refills. 60 tablet 0  . metoprolol tartrate (LOPRESSOR) 50 MG tablet Take 1.5 tablets (75 mg total) by mouth 2 (two) times daily. 270 tablet 3  . Omega-3 Fatty Acids (FISH OIL) 1000 MG CAPS Take 1,000 mg by mouth at bedtime.    . ondansetron (ZOFRAN) 4 MG tablet Take 1 tablet (4 mg total) by mouth every 6 (six) hours. Prn n/v. 12 tablet 0  . lisinopril (PRINIVIL,ZESTRIL) 5 MG tablet Take 1 tablet (5 mg total) by mouth daily. 30 tablet 11   No facility-administered medications prior to visit.      Allergies:   Heparin   Social History   Socioeconomic History  . Marital status: Single    Spouse name: None  . Number of children: 2  . Years of education: None  . Highest education level: None  Social Needs  . Financial resource strain: None  . Food insecurity - worry: None  . Food insecurity - inability: None  . Transportation needs - medical: None  . Transportation needs - non-medical: None  Occupational History  . Occupation: Scientist, research (medical)CNA    Employer: [employer]  Tobacco Use  . Smoking status: Former Smoker    Packs/day: 0.00    Types: Cigarettes    Last attempt to quit: 01/10/2015    Years since quitting: 2.2  . Smokeless tobacco: Never Used  Substance and Sexual Activity  . Alcohol use: No    Alcohol/week: 0.0 oz  . Drug use: No  . Sexual activity: None  Other Topics Concern  . None  Social History Narrative  . None     Family  History:  The patient's family history includes Heart attack (age of onset: 8073) in her father; Heart disease in her father; Hypertension in her mother; Kidney failure in her mother; Stroke (age of onset: 1771) in her mother.   ROS:   Please see the history of present illness.    ROS All other systems reviewed and are negative.   PHYSICAL EXAM:   VS:  BP (!) 169/88   Pulse 64   Ht 5\' 3"  (1.6 m)   Wt 186 lb 9.6 oz (84.6 kg)   BMI 33.05 kg/m    GEN: Well nourished, well developed, in no acute distress  HEENT: normal  Neck: no JVD, carotid bruits, or masses Cardiac: RRR; no murmurs, rubs, or gallops,no edema  Respiratory:  clear to auscultation bilaterally, normal work of breathing GI:  soft, nontender, nondistended, + BS MS: no deformity or atrophy  Skin: warm and dry, no rash Neuro:  Alert and Oriented x 3, Strength and sensation are intact Psych: euthymic mood, full affect  Wt Readings from Last 3 Encounters:  04/13/17 186 lb 9.6 oz (84.6 kg)  03/08/17 188 lb (85.3 kg)  10/07/16 189 lb 9.6 oz (86 kg)      Studies/Labs Reviewed:   EKG:  EKG is not ordered today.   Recent Labs: 04/13/2017: BUN 15; Creatinine, Ser 0.94; Potassium 4.5; Sodium 139   Lipid Panel    Component Value Date/Time   CHOL 96 02/03/2015 0235   TRIG 88 02/03/2015 0235   HDL 32 (L) 02/03/2015 0235   CHOLHDL 3.0 02/03/2015 0235   VLDL 18 02/03/2015 0235   LDLCALC 46 02/03/2015 0235    Additional studies/ records that were reviewed today include:   Echo 08/19/2015 LV EF: 45% -   50%  Study Conclusions  - Left ventricle: The cavity size was normal. Wall thickness was   normal. Systolic function was mildly reduced. The estimated   ejection fraction was in the range of 45% to 50%. Doppler   parameters are consistent with abnormal left ventricular   relaxation (grade 1 diastolic dysfunction). - Aortic valve: Mildly calcified annulus. - Right ventricle: The cavity size was mildly dilated. Systolic    function was mildly reduced. - Pulmonary arteries: Systolic pressure was mildly increased. PA   peak pressure: 32 mm Hg (S).   ASSESSMENT:    1. Essential hypertension   2. Encounter for long-term (current) use of medications   3. Hyperlipidemia, unspecified hyperlipidemia type   4. Controlled type 2 diabetes mellitus without complication, without long-term current use of insulin (HCC)   5. Coronary artery disease involving coronary bypass graft of native heart without angina pectoris   6. Carotid artery disease, unspecified laterality, unspecified type (HCC)      PLAN:  In order of problems listed above:  1. Hypertension: We will increase lisinopril to 10 mg daily, I have given her 20 mg tablets, after 2 weeks if her systolic blood pressures still remain elevated, she has been instructed to increase her lisinopril to 20 mg daily.  Basic metabolic panel today to monitor for electrolyte shift.  2. Hyperlipidemia: Continue lovastatin.  Lipid panel and LFT by primary care provider  3. DM 2: On metformin and Insulin, managed by PCP.  4. CAD s/p CABG: No obvious angina.  Continue aspirin  5. Carotid artery disease: Mild disease on last carotid Doppler.    Medication Adjustments/Labs and Tests Ordered: Current medicines are reviewed at length with the patient today.  Concerns regarding medicines are outlined above.  Medication changes, Labs and Tests ordered today are listed in the Patient Instructions below. Patient Instructions  Medication Instructions:   INCREASE lisinopril to 10mg  daily. We are prescribing the 20mg  tablets - take 1/2 tablet daily. Continue to check your BP. If in 2 weeks your systolic readings are still above 120, increase the lisinopril to 20mg  daily.  Labwork:   BMET today  Testing/Procedures:  none  Follow-Up:  With Dr. Allyson SabalBerry in 3 months.  If you need a refill on your cardiac medications before your next appointment, please call your  pharmacy.      Ramond DialSigned, Keionte Swicegood, [place]  04/14/2017 11:57 PM    [facility] [address], [place], [place]  [address] Phone: [phone]; Fax: [phone]

## 2017-04-13 NOTE — Patient Instructions (Addendum)
Medication Instructions:   INCREASE lisinopril to 10mg  daily. We are prescribing the 20mg  tablets - take 1/2 tablet daily. Continue to check your BP. If in 2 weeks your systolic readings are still above 120, increase the lisinopril to 20mg  daily.  Labwork:   BMET today  Testing/Procedures:  none  Follow-Up:  With Dr. Allyson SabalBerry in 3 months.  If you need a refill on your cardiac medications before your next appointment, please call your pharmacy.

## 2017-04-14 ENCOUNTER — Encounter: Payer: Self-pay | Admitting: Physician Assistant

## 2017-07-13 ENCOUNTER — Ambulatory Visit: Payer: Medicaid Other | Admitting: Cardiovascular Disease

## 2017-08-04 ENCOUNTER — Ambulatory Visit: Payer: Self-pay | Admitting: Cardiovascular Disease

## 2017-08-13 ENCOUNTER — Encounter: Payer: Self-pay | Admitting: Cardiovascular Disease

## 2017-08-13 ENCOUNTER — Ambulatory Visit (INDEPENDENT_AMBULATORY_CARE_PROVIDER_SITE_OTHER): Payer: Self-pay | Admitting: Cardiovascular Disease

## 2017-08-13 VITALS — BP 162/88 | HR 84 | Ht 63.0 in | Wt 187.0 lb

## 2017-08-13 DIAGNOSIS — E785 Hyperlipidemia, unspecified: Secondary | ICD-10-CM

## 2017-08-13 DIAGNOSIS — I10 Essential (primary) hypertension: Secondary | ICD-10-CM

## 2017-08-13 DIAGNOSIS — Z951 Presence of aortocoronary bypass graft: Secondary | ICD-10-CM

## 2017-08-13 DIAGNOSIS — I25110 Atherosclerotic heart disease of native coronary artery with unstable angina pectoris: Secondary | ICD-10-CM

## 2017-08-13 MED ORDER — LISINOPRIL 40 MG PO TABS: 40.0000 mg | ORAL_TABLET | Freq: Every day | ORAL | 6 refills | Status: DC

## 2017-08-13 NOTE — Assessment & Plan Note (Signed)
History of essential hypertension with blood pressure measurements at 162/88. She is on metoprolol and lisinopril 20 mg. She does admit to dietary indiscretion with regard to salt. I am going to increase her lisinopril from 20-40 mg a day, will check a basic metabolic panel in 2 weeks and have her see Belenda CruiseKristin back in 1 month for review of her blood pressure log.

## 2017-08-13 NOTE — Assessment & Plan Note (Signed)
History of hyperlipidemia on statin therapy followed by her PCP. 

## 2017-08-13 NOTE — Progress Notes (Signed)
08/13/2017 [name]   01-04-1971  [id number]  Primary Physician Fleet Contras, MD Primary Cardiologist: Runell Gess MD FACP, [place], [place], [place]  HPI:  [name] is a 47 y.o.  moderately overweight African-American female with history of tobacco abuse, diabetes, hypertension, hyperlipidemia and medication noncompliance. I last saw her in the office 10/07/16. She's had RCA intervention in the past with re-intervention on 09/07/14. She had accelerated angina and underwent repeat cardiac catheterization by myself 02/01/15 revealing three-vessel disease with moderate LV dysfunction. She'll only underwent coronary artery bypass grafting X 3 by Dr. Kathlee Nations Trigt 02/07/15 with a LIMA to LAD, vein to obtuse marginal branch and PDA. Several days postop she had a PEA arrest requiring resuscitation, intubation and placement of an intra-aortic balloon pump. Ultimately she was discharged home. She is recuperating nicely. She did stop smoking. Her most recent 2-D echo performed 08/19/15 revealed EF of 45-50%. Since I saw her back she's remained clinically stable. She does not smoke. She eats a relatively healthy diet according to her. She has had issues with difficult to control hypertension. She did see Azalee Course PA-in the clinic since her last office visit with me who initiated lisinopril with escalating doses. She still is relatively hypertensive.     Current Meds  Medication Sig  . acetaminophen (TYLENOL) 500 MG tablet Take 500 mg by mouth every 6 (six) hours as needed.  Marland Kitchen aspirin EC 81 MG tablet Take 81 mg by mouth daily.  . insulin glargine (LANTUS) 100 UNIT/ML injection Inject 20 Units into the skin at bedtime.   . lovastatin (MEVACOR) 20 MG tablet Take 1 tablet (20 mg total) at bedtime by mouth.  . metFORMIN (GLUCOPHAGE) 500 MG tablet Take 1 tablet (500 mg total) by mouth 2 (two) times daily with a meal. Pt will need to establish care with PCP for next refills.  . metoprolol  tartrate (LOPRESSOR) 50 MG tablet Take 1.5 tablets (75 mg total) by mouth 2 (two) times daily.  . Omega-3 Fatty Acids (FISH OIL) 1000 MG CAPS Take 1,000 mg by mouth at bedtime.  . ondansetron (ZOFRAN) 4 MG tablet Take 1 tablet (4 mg total) by mouth every 6 (six) hours. Prn n/v.     Allergies  Allergen Reactions  . 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 Marital status: Single    Spouse name: Not on file  . Number of children: 2  . Years of education: Not on file  . Highest education level: Not on file  Occupational History  . 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 Financial resource strain: Not on file  . Food insecurity:    Worry: Not on file    Inability: Not on file  . Transportation needs:    Medical: Not on file    Non-medical: Not on file  Tobacco Use  . Smoking status: Former Smoker    Packs/day: 0.00    Types: Cigarettes    Last attempt to quit: 01/10/2015    Years since quitting: 2.5  . Smokeless tobacco: Never Used  Substance and Sexual Activity  . Alcohol use: No    Alcohol/week: 0.0 oz  . Drug use: No  . Sexual activity: Not on file  Lifestyle  . Physical activity:    Days per  week: Not on file    Minutes per session: Not on file  . Stress: Not on file  Relationships  . Social connections:    Talks on phone: Not on file    Gets together: Not on file    Attends religious service: Not on file    Active member of club or organization: Not on file    Attends meetings of clubs or organizations: Not on file    Relationship status: Not on file  . Intimate partner violence:    Fear of current or ex partner: Not on file    Emotionally abused: Not on file    Physically abused: Not on file    Forced sexual activity: Not on file  Other Topics Concern  . Not on file  Social History Narrative  . Not on file       Review of Systems: General: negative for chills, fever, night sweats or weight changes.  Cardiovascular: negative for chest pain, dyspnea on exertion, edema, orthopnea, palpitations, paroxysmal nocturnal dyspnea or shortness of breath Dermatological: negative for rash Respiratory: negative for cough or wheezing Urologic: negative for hematuria Abdominal: negative for nausea, vomiting, diarrhea, bright red blood per rectum, melena, or hematemesis Neurologic: negative for visual changes, syncope, or dizziness All other systems reviewed and are otherwise negative except as noted above.    Blood pressure (!) 162/88, pulse 84, height 5\' 3"  (1.6 m), weight 187 lb (84.8 kg).  General appearance: alert and no distress Neck: no adenopathy, no carotid bruit, no JVD, supple, symmetrical, trachea midline and thyroid not enlarged, symmetric, no tenderness/mass/nodules Lungs: clear to auscultation bilaterally Heart: regular rate and rhythm, S1, S2 normal, no murmur, click, rub or gallop Extremities: extremities normal, atraumatic, no cyanosis or edema Pulses: 2+ and symmetric Skin: Skin color, texture, turgor normal. No rashes or lesions Neurologic: Alert and oriented X 3, normal strength and tone. Normal symmetric reflexes. Normal coordination and gait  EKG sinus rhythm at 84 with left ventricular hypertrophy with repolarization changes, left axis deviation. I personally reviewed this EKG.  ASSESSMENT AND PLAN:   Essential hypertension History of essential hypertension with blood pressure measurements at 162/88. She is on metoprolol and lisinopril 20 mg. She does admit to dietary indiscretion with regard to salt. I am going to increase her lisinopril from 20-40 mg a day, will check a basic metabolic panel in 2 weeks and have her see Belenda CruiseKristin back in 1 month for review of her blood pressure log.  Hyperlipidemia with target LDL less than 70 History of hyperlipidemia on statin therapy followed by  her PCP  S/P CABG x 3 History of CAD status post RCA interventions in the past with intervention 09/07/14. She underwent repeat cardiac catheterization by myself 02/01/15 the setting of accelerated angina revealing three-vessel disease with moderate LV dysfunction. She underwent coronary artery bypass grafting 3 by Dr. Kathlee NationsPeter Van Trigt  02/07/15 with a LIMA to LAD, vein to obtuse marginal branch and PDA. Several days postop she had a PEA arrest requiring resuscitation, intubation and placement of an intra-aortic balloon pump. She was also totally discharged home. Her EF is 35-50% by 2-D echo performed 08/19/15. She denies chest pain.      Runell GessJonathan J. Lovada Barwick MD FACP,FACC,FAHA, [facility] 08/13/2017 2:08 PM

## 2017-08-13 NOTE — Patient Instructions (Addendum)
Medication Instructions: Your physician recommends that you continue on your current medications as directed. Please refer to the Current Medication list given to you today.  Increase Lisinopril to 40 mg daily.   Labwork: Your physician recommends that you return for lab work in: 2 weeks--BMET (to check kidney function)   Follow-Up: Your physician recommends that you schedule a follow-up appointment in: 1 month with PharmD.  We request that you follow-up in: 6 months with Azalee CourseHao Meng, PA and in 12 months with Dr San MorelleBerry  You will receive a reminder letter in the mail two months in advance. If you don't receive a letter, please call our office to schedule the follow-up appointment.  If you need a refill on your cardiac medications before your next appointment, please call your pharmacy.

## 2017-08-13 NOTE — Assessment & Plan Note (Signed)
History of CAD status post RCA interventions in the past with intervention 09/07/14. She underwent repeat cardiac catheterization by myself 02/01/15 the setting of accelerated angina revealing three-vessel disease with moderate LV dysfunction. She underwent coronary artery bypass grafting 3 by Dr. Kathlee NationsPeter Van Trigt  02/07/15 with a LIMA to LAD, vein to obtuse marginal branch and PDA. Several days postop she had a PEA arrest requiring resuscitation, intubation and placement of an intra-aortic balloon pump. She was also totally discharged home. Her EF is 35-50% by 2-D echo performed 08/19/15. She denies chest pain.

## 2017-09-15 ENCOUNTER — Ambulatory Visit (INDEPENDENT_AMBULATORY_CARE_PROVIDER_SITE_OTHER): Payer: Self-pay | Admitting: Pharmacist Clinician (PhC)/ Clinical Pharmacy Specialist

## 2017-09-15 DIAGNOSIS — I10 Essential (primary) hypertension: Secondary | ICD-10-CM

## 2017-09-15 MED ORDER — AMLODIPINE BESYLATE 5 MG PO TABS: 5.0000 mg | ORAL_TABLET | Freq: Every day | ORAL | 3 refills | Status: DC

## 2017-09-15 NOTE — Patient Instructions (Signed)
Return for a a follow up appointment in 1 month  Your blood pressure today is 156/80   Check your blood pressure at home daily and keep record of the readings.  Take your BP meds as follows:  Continue with lisinopril 40 mg each morning and metoprolol 75 mg twice daily.  Add amlodipine 5 mg once daily in the evenings.  Bring all of your meds, your BP cuff and your record of home blood pressures to your next appointment.  Exercise as you're able, try to walk approximately 30 minutes per day.  Keep salt intake to a minimum, especially watch canned and prepared boxed foods.  Eat more fresh fruits and vegetables and fewer canned items.  Avoid eating in fast food restaurants.    HOW TO TAKE YOUR BLOOD PRESSURE: . Rest 5 minutes before taking your blood pressure. .  Don't smoke or drink caffeinated beverages for at least 30 minutes before. . Take your blood pressure before (not after) you eat. . Sit comfortably with your back supported and both feet on the floor (don't cross your legs). . Elevate your arm to heart level on a table or a desk. . Use the proper sized cuff. It should fit smoothly and snugly around your bare upper arm. There should be enough room to slip a fingertip under the cuff. The bottom edge of the cuff should be 1 inch above the crease of the elbow. . Ideally, take 3 measurements at one sitting and record the average.

## 2017-09-15 NOTE — Progress Notes (Signed)
09/16/2017 [name] [birth year] [id number]   HPI:  [name] is a 47 y.o. female patient of Dr Allyson Sabal, with a PMH below who presents today for hypertension clinic evaluation.  In addition to hypertension, her medical history is significant for diabetes, hyperlipidemia, CAD s/p CABG x 2 and former smoker (she quit when she had the CABG in 2016).   Patient states she has been told her blood pressure is "hard to treat", but it appears she is only on lisinopril and metoprolol.    Today she reports that she is feeling well.  No chest pain, shortness of breath or lower extremity edema.  She does admit to occasional headaches, which she believes may be related to hypertension, but she has not actually checked her BP during one of these.      Blood Pressure Goal:  130/80  Current Medications:  Lisinopril 40 mg - am  Metoprolol 75 mg bid  Family Hx:  Father died from MI/stroke at 55-80, hypertension, DM  Mother died from kidney disease at 65 (on dialysis for 13 years), DM, hypertension; first stroke at age 78  2 sisters with hypertension   1 brother deceased from DM complications at 4  2 children (23, 32) no hypertension   Social Hx:  Quit smoking 2016; no alcohol (rare social); coffee 1 cup per day pepsi 1 can many days of the week   Diet:  Mix of home/restaurant - no added salt at table, some with seasoning; fried foods 1-3 times per week; eating out is mix of fast food/sandwich shop/sit down  Exercise:  No regular exercise; occasional walk  Home BP readings:  Highest 158/94, nothing 160s or 100; 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 acetaminophen (TYLENOL) 500 MG tablet Take 500 mg by mouth every 6 (six) hours as needed.    Marland Kitchen amLODipine (NORVASC) 5 MG tablet Take 1 tablet (5 mg total) by mouth daily. 30 tablet 3  . aspirin EC 81 MG tablet Take 81 mg by mouth daily.    . insulin glargine (LANTUS) 100 UNIT/ML injection Inject 20 Units into the skin at bedtime.     Marland Kitchen lisinopril (PRINIVIL,ZESTRIL) 40 MG tablet Take 1 tablet (40 mg total) by mouth daily. 30 tablet 6  . lovastatin (MEVACOR) 20 MG tablet Take 1 tablet (20 mg total) at bedtime by mouth. 30 tablet 11  . metFORMIN (GLUCOPHAGE) 500 MG tablet Take 1 tablet (500 mg total) by mouth 2 (two) times daily with a meal. Pt will need to establish care with PCP for next refills. 60 tablet 0  . metoprolol tartrate (LOPRESSOR) 50 MG tablet Take 1.5 tablets (75 mg total) by mouth 2 (two) times daily. 270 tablet 3  . Omega-3 Fatty Acids (FISH OIL) 1000 MG CAPS Take 1,000 mg by mouth at bedtime.     No current facility-administered medications for this visit.     Allergies  Allergen Reactions  . 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 CAD (coronary artery disease)    cath 09/07/2014 DES to 95% prox RCA, residual 70-80% origin of AV groove, 90% ostial PDA stenosis; 01/2015, NSTEMI w/ CABG, LIMA-LAD, SVG-OM1, SVG-PCA  . Carotid artery disease (HCC)    a. 40-59% BICA by duplex 01/2015.  . Diabetes mellitus without complication (HCC)   . H/O medication noncompliance   . Hyperlipidemia   . Hypertension   . MI (myocardial infarction) (HCC)   . Tobacco abuse     Blood pressure (!) 156/80, pulse 78.  Essential hypertension Patient with poorly controlled hypertension, currently only on metoprolol and lisinopril.  Will add amlodipine 5 mg daily for her to take in the  evenings.  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  She will return in 4 weeks for follow up .  I did send her to the lab today, as she has not had a BMET since increasing lisinopril to 40 mg daily.    Phillips Hay PharmD CPP [facility] [address] [place], [place] [address] [phone]

## 2017-09-16 ENCOUNTER — Encounter: Payer: Self-pay | Admitting: Pharmacist Clinician (PhC)/ Clinical Pharmacy Specialist

## 2017-09-16 LAB — BASIC METABOLIC PANEL
BUN/Creatinine Ratio: 16 (ref 9–23)
BUN: 18 mg/dL (ref 6–24)
CALCIUM: 9.4 mg/dL (ref 8.7–10.2)
CHLORIDE: 96 mmol/L (ref 96–106)
CO2: 24 mmol/L (ref 20–29)
Creatinine, Ser: 1.11 mg/dL — ABNORMAL HIGH (ref 0.57–1.00)
GFR calc Af Amer: 69 mL/min/{1.73_m2} (ref >59)
GFR calc non Af Amer: 60 mL/min/{1.73_m2} (ref >59)
GLUCOSE: 498 mg/dL — AB (ref 65–99)
POTASSIUM: 4.8 mmol/L (ref 3.5–5.2)
SODIUM: 133 mmol/L — AB (ref 134–144)

## 2017-09-16 NOTE — Assessment & Plan Note (Signed)
Patient with poorly controlled hypertension, currently only on metoprolol and lisinopril.  Will add amlodipine 5 mg daily for her to take in the evenings.  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  She will return in 4 weeks for follow up .  I did send her to the lab today, as she has not had a BMET since increasing lisinopril to 40 mg daily.

## 2017-09-21 ENCOUNTER — Telehealth: Payer: Self-pay | Admitting: *Deleted

## 2017-09-21 DIAGNOSIS — N289 Disorder of kidney and ureter, unspecified: Secondary | ICD-10-CM

## 2017-09-21 NOTE — Telephone Encounter (Signed)
-----   Message from Runell Gess, MD sent at 09/16/2017  8:26 AM EDT ----- Serum glucose significantly elevated, serum creatinine mildly elevated.  Repeat in 3 weeks.

## 2017-09-21 NOTE — Telephone Encounter (Signed)
Spoke with pt, Lab orders mailed to the pt  

## 2017-10-15 ENCOUNTER — Ambulatory Visit (INDEPENDENT_AMBULATORY_CARE_PROVIDER_SITE_OTHER): Payer: Self-pay | Admitting: Pharmacist Clinician (PhC)/ Clinical Pharmacy Specialist

## 2017-10-15 DIAGNOSIS — I10 Essential (primary) hypertension: Secondary | ICD-10-CM

## 2017-10-15 NOTE — Assessment & Plan Note (Signed)
Patient with essential hypertension, doing well with current medications.  Her BP in the office today was textbook, however her home readings continue to run 30 points higher.  Home cuff was tested in the office and read within 10 points.  At this time I am not sure if this has to do with technique or if she perhaps has masked hypertension.  She will continue with home checks twice daily, 3-4 times per week and we will see her back in 6 weeks.   Reviewed technique with patient.  Encouraged her to continue with dietary improvements.

## 2017-10-15 NOTE — Progress Notes (Signed)
10/15/2017 [name] 1970/05/13 [id number]   HPI:  [name] is a 47 y.o. female patient of Dr Allyson Sabal, with a PMH below who presents today for hypertension clinic evaluation.  In addition to hypertension, her medical history is significant for diabetes, hyperlipidemia, CAD s/p CABG x 2 and former smoker (she quit when she had the CABG in 2016).   Patient states she has been told her blood pressure is "hard to treat", but it appears she is only on lisinopril and metoprolol.    Today she reports that she is feeling well.  No chest pain, or shortness of breath, but does feel that she tires out easily these days.  No changes in her medications, does admit that she has to ration or skip insulin doses from time to time due to cost concerns.  No lower extremity edema with amlodipine.    Blood Pressure Goal:  130/80  Current Medications:  Lisinopril 40 mg - am  Amlodipine 5 mg - pm  Metoprolol 75 mg bid  Family Hx:  Father died from MI/stroke at 100-80, hypertension, DM  Mother died from kidney disease at 39 (on dialysis for 13 years), DM, hypertension; first stroke at age 110  2 sisters with hypertension   1 brother deceased from DM complications at 1  2 children (23, 21) no hypertension   Social Hx:  Quit smoking 2016; no alcohol (rare social); coffee 1 cup per day pepsi 1 can many days of the week   Diet:  Mix of home/restaurant - no added salt at table, some with seasoning; fried foods 1-3 times per week; eating out is mix of fast food/sandwich shop/sit down;  Recently has been cutting back on eating out and fried foods; eating more fruits than veggies.  Exercise:  No regular exercise; occasional walk  Home BP readings:  Took 12 morning readings and 14 evening readings in the past month.  AM average 152/82, PM average 147/78.  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 acetaminophen (TYLENOL) 500 MG tablet Take 500 mg by mouth every 6 (six) hours as needed.    Marland Kitchen amLODipine (NORVASC) 5 MG tablet Take 1 tablet (5 mg total) by mouth daily. 30 tablet 3  . aspirin EC 81 MG tablet Take 81 mg by mouth daily.    . insulin glargine (LANTUS) 100 UNIT/ML injection Inject 20 Units into the skin at bedtime.     Marland Kitchen lisinopril (PRINIVIL,ZESTRIL) 40 MG tablet Take 1 tablet (40 mg total) by mouth daily. 30 tablet 6  . lovastatin (MEVACOR) 20 MG tablet Take 1 tablet (20 mg total) at bedtime by mouth. 30 tablet 11  . metFORMIN (GLUCOPHAGE) 500 MG tablet Take 1 tablet (500 mg total) by mouth 2 (two) times daily with a meal. Pt will need to establish care with PCP for next refills. 60 tablet 0  . metoprolol tartrate (LOPRESSOR) 50 MG tablet Take 1.5 tablets (75 mg total) by mouth 2 (two) times daily. 270  tablet 3  . Omega-3 Fatty Acids (FISH OIL) 1000 MG CAPS Take 1,000 mg by mouth at bedtime.     No current facility-administered medications for this visit.     Allergies  Allergen Reactions  . 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 CAD (coronary artery disease)    cath 09/07/2014 DES to 95% prox RCA, residual 70-80% origin of AV groove, 90% ostial PDA stenosis; 01/2015, NSTEMI w/ CABG, LIMA-LAD, SVG-OM1, SVG-PCA  . Carotid artery disease (HCC)    a. 40-59% BICA by duplex 01/2015.  . Diabetes mellitus without complication (HCC)   . H/O medication noncompliance   . Hyperlipidemia   . Hypertension   . MI (myocardial infarction) (HCC)    . Tobacco abuse     Blood pressure 120/66, pulse 68.  Essential hypertension Patient with essential hypertension, doing well with current medications.  Her BP in the office today was textbook, however her home readings continue to run 30 points higher.  Home cuff was tested in the office and read within 10 points.  At this time I am not sure if this has to do with technique or if she perhaps has masked hypertension.  She will continue with home checks twice daily, 3-4 times per week and we will see her back in 6 weeks.   Reviewed technique with patient.  Encouraged her to continue with dietary improvements.     Phillips HayKristin [place] Eimers PharmD CPP [facility] [address] [place], [place] [id number] [phone]

## 2017-10-15 NOTE — Patient Instructions (Addendum)
Return for a a follow up appointment in 6 weeks  Your blood pressure today is 120/66   Check your blood pressure at home twice daily about 3-4 times per week and keep record of the readings.  Take your BP meds as follows:  Lisinopril 40 mg - am  Amlodipine 5 mg - pm  Metoprolol 75 mg bid   Bring all of your meds, your BP cuff and your record of home blood pressures to your next appointment.  Exercise as you're able, try to walk approximately 30 minutes per day.  Keep salt intake to a minimum, especially watch canned and prepared boxed foods.  Eat more fresh fruits and vegetables and fewer canned items.  Avoid eating in fast food restaurants.    HOW TO TAKE YOUR BLOOD PRESSURE: . Rest 5 minutes before taking your blood pressure. .  Don't smoke or drink caffeinated beverages for at least 30 minutes before. . Take your blood pressure before (not after) you eat. . Sit comfortably with your back supported and both feet on the floor (don't cross your legs). . Elevate your arm to heart level on a table or a desk. . Use the proper sized cuff. It should fit smoothly and snugly around your bare upper arm. There should be enough room to slip a fingertip under the cuff. The bottom edge of the cuff should be 1 inch above the crease of the elbow. . Ideally, take 3 measurements at one sitting and record the average.

## 2017-10-16 LAB — BASIC METABOLIC PANEL
BUN/Creatinine Ratio: 20 (ref 9–23)
BUN: 25 mg/dL — AB (ref 6–24)
CALCIUM: 9.7 mg/dL (ref 8.7–10.2)
CHLORIDE: 96 mmol/L (ref 96–106)
CO2: 23 mmol/L (ref 20–29)
Creatinine, Ser: 1.22 mg/dL — ABNORMAL HIGH (ref 0.57–1.00)
GFR, EST AFRICAN AMERICAN: 62 mL/min/{1.73_m2} (ref >59)
GFR, EST NON AFRICAN AMERICAN: 54 mL/min/{1.73_m2} — AB (ref >59)
Glucose: 423 mg/dL — ABNORMAL HIGH (ref 65–99)
Potassium: 5 mmol/L (ref 3.5–5.2)
Sodium: 134 mmol/L (ref 134–144)

## 2017-11-22 ENCOUNTER — Ambulatory Visit (INDEPENDENT_AMBULATORY_CARE_PROVIDER_SITE_OTHER): Payer: Self-pay | Admitting: Pharmacist

## 2017-11-22 VITALS — BP 118/72 | HR 68

## 2017-11-22 DIAGNOSIS — I10 Essential (primary) hypertension: Secondary | ICD-10-CM

## 2017-11-22 MED ORDER — CARVEDILOL 12.5 MG PO TABS: 12.5000 mg | ORAL_TABLET | Freq: Two times a day (BID) | ORAL | 3 refills | Status: DC

## 2017-11-22 NOTE — Progress Notes (Signed)
HPI:  [name] is a 47 y.o. female patient of Dr Allyson SabalBerry, with a PMH below who presents today for hypertension clinic evaluation.  In addition to hypertension, her medical history is significant for diabetes, hyperlipidemia, CAD s/p CABG x 2 and former smoker (she quit when she had the CABG in 2016).      Today she reports that she is feeling well.  Denies chest pain, shortness of breath,or increased fatigue.  No lower extremity edema with amlodipine. Her home BP reading remains elevated with most reading in the 140s systolic.  Blood Pressure Goal:  130/80  Current Medications:  Lisinopril 40 mg every morning  Amlodipine 5 mg every evening  Metoprolol 75 mg twice daily  Family Hx:  Father died from MI/stroke at 5475-80, hypertension, DM  Mother died from kidney disease at 3871 (on dialysis for 13 years), DM, hypertension; first stroke at age 46  2 sisters with hypertension   1 brother deceased from DM complications at 3749  2 children (23, 6319) no hypertension   Social Hx:  Quit smoking 2016; no alcohol (rare social); coffee 1 cup per day pepsi 1 can many days of the week   Diet:  Mix of home/restaurant - no added salt at table, some with seasoning; fried foods 1-3 times per week; eating out is mix of fast food/sandwich shop/sit down;  Recently has been cutting back on eating out and fried foods; eating more fruits than veggies.  Exercise:  No regular exercise; occasional walk  Home BP readings:  13 morning readings; average 146/84; HR 66-77bpm  12 evening reading; average 142/79; HR 63-82bpm  Wt Readings from Last 3 Encounters:  08/13/17 187 lb (84.8 kg)  04/13/17 186 lb 9.6 oz (84.6 kg)  03/08/17 188 lb (85.3 kg)   BP Readings from Last 3 Encounters:  11/22/17 118/72  10/15/17 120/66  09/15/17 (!) 156/80   Pulse Readings from Last 3 Encounters:  11/22/17 68  10/15/17 68  09/15/17 78    Current Outpatient Medications  Medication Sig Dispense Refill  .  acetaminophen (TYLENOL) 500 MG tablet Take 500 mg by mouth every 6 (six) hours as needed.    Marland Kitchen. amLODipine (NORVASC) 5 MG tablet Take 1 tablet (5 mg total) by mouth daily. 30 tablet 3  . aspirin EC 81 MG tablet Take 81 mg by mouth daily.    . carvedilol (COREG) 12.5 MG tablet Take 1 tablet (12.5 mg total) by mouth 2 (two) times daily. To replace metoprolol 180 tablet 3  . insulin glargine (LANTUS) 100 UNIT/ML injection Inject 20 Units into the skin at bedtime.     Marland Kitchen. lisinopril (PRINIVIL,ZESTRIL) 40 MG tablet Take 1 tablet (40 mg total) by mouth daily. 30 tablet 6  . lovastatin (MEVACOR) 20 MG tablet Take 1 tablet (20 mg total) at bedtime by mouth. 30 tablet 11  . metFORMIN (GLUCOPHAGE) 500 MG tablet Take 1 tablet (500 mg total) by mouth 2 (two) times daily with a meal. Pt will need to establish care with PCP for next refills. 60 tablet 0  . Omega-3 Fatty Acids (FISH OIL) 1000 MG CAPS Take 1,000 mg by mouth at bedtime.     No current facility-administered medications for this visit.     Allergies  Allergen Reactions  . 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 CAD (coronary  artery disease)    cath 09/07/2014 DES to 95% prox RCA, residual 70-80% origin of AV groove, 90% ostial PDA stenosis; 01/2015, NSTEMI w/ CABG, LIMA-LAD, SVG-OM1, SVG-PCA  . Carotid artery disease (HCC)    a. 40-59% BICA by duplex 01/2015.  . Diabetes mellitus without complication (HCC)   . H/O medication noncompliance   . Hyperlipidemia   . Hypertension   . MI (myocardial infarction) (HCC)   . Tobacco abuse     Blood pressure 118/72, pulse 68, SpO2 99 %.  Essential hypertension BP in the office today was well controlled; however, her home readings continue to run 20 points higher.   Patient reports significant decrease in sodium intake and denies problems with current therapy. Will replace metoprolol  with carvedilol 12.5mg  twice daily. Patient is to monitor BP twice daily for 4 week and bring records to f/u visit. She reports difficulty making her appointments; therefore, we will follow up by phone in 3-4 weeks. Plan to titrate carvedilol to 25mg  twice daily and f/u in clinic in 8 weeks.   Donat Humble Rodriguez-Guzman PharmD, BCPS, CPP [facility] [address] [place] [address] 11/23/2017 1:50 PM

## 2017-11-22 NOTE — Patient Instructions (Addendum)
Return for a  follow up appointment by phone in 4 weeks  Check your blood pressure at home daily (if able) and keep record of the readings.  Take your BP meds as follows: *STOP METOPROLOL ONCE CURRENT SUPPLY GONE* *START CARVEDILOL TO 12.5MG  TWICE DAILY AFTER METOPROLOL COMPLETED*  *STAY HYDRATED* *DECREASE SALT INTAKE*  Bring all of your meds, your BP cuff and your record of home blood pressures to your next appointment.  Exercise as you're able, try to walk approximately 30 minutes per day.  Keep salt intake to a minimum, especially watch canned and prepared boxed foods.  Eat more fresh fruits and vegetables and fewer canned items.  Avoid eating in fast food restaurants.    HOW TO TAKE YOUR BLOOD PRESSURE: . Rest 5 minutes before taking your blood pressure. .  Don't smoke or drink caffeinated beverages for at least 30 minutes before. . Take your blood pressure before (not after) you eat. . Sit comfortably with your back supported and both feet on the floor (don't cross your legs). . Elevate your arm to heart level on a table or a desk. . Use the proper sized cuff. It should fit smoothly and snugly around your bare upper arm. There should be enough room to slip a fingertip under the cuff. The bottom edge of the cuff should be 1 inch above the crease of the elbow. . Ideally, take 3 measurements at one sitting and record the average.

## 2017-11-23 ENCOUNTER — Encounter: Payer: Self-pay | Admitting: Pharmacist

## 2017-11-23 NOTE — Assessment & Plan Note (Signed)
BP in the office today was well controlled; however, her home readings continue to run 20 points higher.   Patient reports significant decrease in sodium intake and denies problems with current therapy. Will replace metoprolol with carvedilol 12.5mg  twice daily. Patient is to monitor BP twice daily for 4 week and bring records to f/u visit. She reports difficulty making her appointments; therefore, we will follow up by phone in 3-4 weeks. Plan to titrate carvedilol to 25mg  twice daily and f/u in clinic in 8 weeks.

## 2017-12-30 ENCOUNTER — Telehealth: Payer: Self-pay | Admitting: Pharmacist

## 2017-12-30 NOTE — Telephone Encounter (Signed)
LMOM; patient taking carvedilol 12.5mg  BID?? BP readings?  Schedule f/u visit?

## 2017-12-31 NOTE — Telephone Encounter (Signed)
BP home readings down between 150-120 systolic. HR 60-81bpm. Patient denies problems with current therapy.   Will continue all mediation without changes and follow up as needed.

## 2018-01-28 ENCOUNTER — Telehealth: Payer: Self-pay | Admitting: Cardiovascular Disease

## 2018-01-28 ENCOUNTER — Ambulatory Visit (HOSPITAL_COMMUNITY)
Admission: RE | Admit: 2018-01-28 | Discharge: 2018-01-28 | Disposition: A | Discharge status: Home / Self Care | Payer: Self-pay | Source: Ambulatory Visit | Attending: Internal Medicine | Admitting: Internal Medicine

## 2018-01-28 DIAGNOSIS — I779 Disorder of arteries and arterioles, unspecified: Secondary | ICD-10-CM

## 2018-01-28 DIAGNOSIS — I739 Peripheral vascular disease, unspecified: Secondary | ICD-10-CM

## 2018-01-28 MED ORDER — AMLODIPINE BESYLATE 5 MG PO TABS: 5.0000 mg | ORAL_TABLET | Freq: Every day | ORAL | 2 refills | Status: DC

## 2018-01-28 NOTE — Telephone Encounter (Signed)
New message    *STAT* If patient is at the pharmacy, call can be transferred to refill team.   1. Which medications need to be refilled? (please list name of each medication and dose if known) amLODipine (NORVASC) 5 MG tablet(Expired)  2. Which pharmacy/location (including street and city if local pharmacy) is medication to be sent to?[facility] [place], [place] - [address]  3. Do they need a 30 day or 90 day supply?30

## 2018-01-28 NOTE — Telephone Encounter (Signed)
Rx(s) sent to pharmacy electronically.  

## 2018-01-28 NOTE — Telephone Encounter (Signed)
Follow up  Re-routing to refill team

## 2018-02-14 ENCOUNTER — Encounter: Payer: Self-pay | Admitting: Physician Assistant

## 2018-02-14 ENCOUNTER — Ambulatory Visit (INDEPENDENT_AMBULATORY_CARE_PROVIDER_SITE_OTHER): Payer: Self-pay | Admitting: Physician Assistant

## 2018-02-14 VITALS — BP 144/88 | HR 72 | Ht 63.0 in | Wt 184.0 lb

## 2018-02-14 DIAGNOSIS — I2510 Atherosclerotic heart disease of native coronary artery without angina pectoris: Secondary | ICD-10-CM

## 2018-02-14 DIAGNOSIS — E119 Type 2 diabetes mellitus without complications: Secondary | ICD-10-CM

## 2018-02-14 DIAGNOSIS — Z794 Long term (current) use of insulin: Secondary | ICD-10-CM

## 2018-02-14 DIAGNOSIS — E785 Hyperlipidemia, unspecified: Secondary | ICD-10-CM

## 2018-02-14 DIAGNOSIS — I779 Disorder of arteries and arterioles, unspecified: Secondary | ICD-10-CM

## 2018-02-14 DIAGNOSIS — I10 Essential (primary) hypertension: Secondary | ICD-10-CM

## 2018-02-14 DIAGNOSIS — R079 Chest pain, unspecified: Secondary | ICD-10-CM

## 2018-02-14 DIAGNOSIS — I739 Peripheral vascular disease, unspecified: Secondary | ICD-10-CM

## 2018-02-14 DIAGNOSIS — Z9861 Coronary angioplasty status: Secondary | ICD-10-CM

## 2018-02-14 NOTE — Progress Notes (Signed)
Cardiology Office Note    Date:  02/16/2018   ID:  [name], DOB 21-Aug-1970, MRN [id number]  PCP:  Fleet Contras, MD  Cardiologist:  Dr. Allyson Sabal   Chief Complaint  Patient presents with  . Follow-up    seen for Dr. Allyson Sabal.     History of Present Illness:  [name] is a 47 y.o. female with PMH of HTN, HLD, DM II, carotid artery disease, and CAD. She had RCA intervention in the past with re-intervention on 09/07/2014. She underwent another cardiac catheterization on 02/01/2015 revealing three-vessel disease with moderate LV dysfunction. She eventually underwent CABG 3 by Dr.Van Margretta Ditty 02/07/2015 with LIMA to LAD, SVG to OM, SVG to PDA. Several days postop, she had a PEA arrest requiring resuscitation, intubation and placement of intra-aortic balloon pump. She was eventually discharged home and recuperatedwell.Echocardiogram obtained on 08/19/2015 showed EF 45-50%.  I saw the patient last year for hypertension and headache.  Due to cost, she was unable to fill the amlodipine.  I switched her to lisinopril instead.  She was seen back by Dr. Allyson Sabal in April 2019.  She admits to dietary indiscretion with regard to salt.  Her lisinopril was increased to 40 mg daily.  She was also referred to the hypertension clinic.  Amlodipine 5 mg daily was again added.  On the last visit with hypertension clinic, his metoprolol was switched to carvedilol 12.5 mg twice daily.  Recent repeat carotid ultrasound on 01/28/2018 showed mild disease bilaterally.  Patient presents today for cardiology office visit.  She has been compliant with her medication.  Since this summer, she has been having intermittent chest pain at rest.  She describes this as a squeezing sensation in the chest, and only last a few seconds at a time.  This occurs about twice weekly basis.  At the last episode was this morning.  Given her prior history, I would recommend a Lexiscan Myoview.  Otherwise her blood pressure today is  140/86.  Her blood pressure was normal on the last visit, I will not change her blood pressure medication based on a single elevated number.  Overall she is been doing well.  The last lipid panel I can see in the system is dated back to 2016, I recommend for her to make sure that she has a annual lipid panel at her PCPs office.   Past Medical History:  Diagnosis Date  . CAD (coronary artery disease)    cath 09/07/2014 DES to 95% prox RCA, residual 70-80% origin of AV groove, 90% ostial PDA stenosis; 01/2015, NSTEMI w/ CABG, LIMA-LAD, SVG-OM1, SVG-PCA  . Carotid artery disease (HCC)    a. 40-59% BICA by duplex 01/2015.  . Diabetes mellitus without complication (HCC)   . H/O medication noncompliance   . Hyperlipidemia   . Hypertension   . MI (myocardial infarction) (HCC)   . Tobacco abuse     Past Surgical History:  Procedure Laterality Date  . ABDOMINAL HYSTERECTOMY    . CARDIAC CATHETERIZATION N/A 02/01/2015   Procedure: Left Heart Cath and Coronary Angiography;  Surgeon: Runell Gess, MD;  Location: [facility] INVASIVE CV LAB;  Service: Cardiovascular;  Laterality: N/A;  . CARDIAC CATHETERIZATION N/A 02/07/2015   Procedure: IABP Insertion;  Surgeon: Dolores Patty, MD;  Location: [facility] INVASIVE CV LAB;  Service: Cardiovascular;  Laterality: N/A;  . CORONARY ARTERY BYPASS GRAFT N/A 02/07/2015   Procedure: CORONARY ARTERY BYPASS GRAFTING (CABG);  Surgeon: Kerin Perna, MD; LIMA-LAD, SVT-OM1, SVG-PDA  .  LEFT HEART CATHETERIZATION WITH CORONARY ANGIOGRAM N/A 05/29/2014   Procedure: LEFT HEART CATHETERIZATION WITH CORONARY ANGIOGRAM;  Surgeon: Corky Crafts, MD;  Location: [facility] CATH LAB;  Service: Cardiovascular;  Laterality: N/A;  . LEFT HEART CATHETERIZATION WITH CORONARY ANGIOGRAM N/A 09/07/2014   Procedure: LEFT HEART CATHETERIZATION WITH CORONARY ANGIOGRAM;  Surgeon: Marykay Lex, MD;  Location: [facility] CATH LAB;  Service: Cardiovascular;  Laterality: N/A;  . PERCUTANEOUS CORONARY STENT  INTERVENTION (PCI-S)  05/29/2014   Procedure: PERCUTANEOUS CORONARY STENT INTERVENTION (PCI-S);  Surgeon: Corky Crafts, MD;  Location: [facility] CATH LAB;  Service: Cardiovascular;;  . TEE WITHOUT CARDIOVERSION N/A 02/07/2015   Procedure: TRANSESOPHAGEAL ECHOCARDIOGRAM (TEE);  Surgeon: Kerin Perna, MD;  Location: [facility] OR;  Service: Open Heart Surgery;  Laterality: N/A;    Current Medications: Outpatient Medications Prior to Visit  Medication Sig Dispense Refill  . acetaminophen (TYLENOL) 500 MG tablet Take 500 mg by mouth every 6 (six) hours as needed.    Marland Kitchen amLODipine (NORVASC) 5 MG tablet Take 1 tablet (5 mg total) by mouth daily. 90 tablet 2  . aspirin EC 81 MG tablet Take 81 mg by mouth daily.    . carvedilol (COREG) 12.5 MG tablet Take 1 tablet (12.5 mg total) by mouth 2 (two) times daily. To replace metoprolol 180 tablet 3  . insulin glargine (LANTUS) 100 UNIT/ML injection Inject 20 Units into the skin at bedtime.     Marland Kitchen lisinopril (PRINIVIL,ZESTRIL) 40 MG tablet Take 1 tablet (40 mg total) by mouth daily. 30 tablet 6  . lovastatin (MEVACOR) 20 MG tablet Take 1 tablet (20 mg total) at bedtime by mouth. 30 tablet 11  . metFORMIN (GLUCOPHAGE) 500 MG tablet Take 1 tablet (500 mg total) by mouth 2 (two) times daily with a meal. Pt will need to establish care with PCP for next refills. 60 tablet 0  . Omega-3 Fatty Acids (FISH OIL) 1000 MG CAPS Take 1,000 mg by mouth at bedtime.     No facility-administered medications prior to visit.      Allergies:   Heparin   Social History   Socioeconomic History  . Marital status: Single    Spouse name: Not on file  . Number of children: 2  . Years of education: Not on file  . Highest education level: Not on file  Occupational History  . 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 Financial resource strain: Not on file  . Food insecurity:    Worry: Not on file    Inability: Not on file  . Transportation needs:     Medical: Not on file    Non-medical: Not on file  Tobacco Use  . Smoking status: Former Smoker    Packs/day: 0.00    Types: Cigarettes    Last attempt to quit: 01/10/2015    Years since quitting: 3.1  . Smokeless tobacco: Never Used  Substance and Sexual Activity  . Alcohol use: No    Alcohol/week: 0.0 standard drinks  . Drug use: No  . Sexual activity: Not on file  Lifestyle  . Physical activity:    Days per week: Not on file    Minutes per session: Not on file  . Stress: Not on file  Relationships  . Social connections:    Talks on phone: Not on file    Gets together: Not on file    Attends religious service: Not on file    Active member of  club or organization: Not on file    Attends meetings of clubs or organizations: Not on file    Relationship status: Not on file  Other Topics Concern  . Not on file  Social History Narrative  . Not on file     Family History:  The patient's family history includes Heart attack (age of onset: 34) in her father; Heart disease in her father; Hypertension in her mother; Kidney failure in her mother; Stroke (age of onset: 85) in her mother.   ROS:   Please see the history of present illness.    ROS All other systems reviewed and are negative.   PHYSICAL EXAM:   VS:  BP (!) 144/88   Pulse 72   Ht 5\' 3"  (1.6 m)   Wt 184 lb (83.5 kg)   SpO2 99%   BMI 32.59 kg/m    GEN: Well nourished, well developed, in no acute distress  HEENT: normal  Neck: no JVD, carotid bruits, or masses Cardiac: RRR; no murmurs, rubs, or gallops,no edema  Respiratory:  clear to auscultation bilaterally, normal work of breathing GI: soft, nontender, nondistended, + BS MS: no deformity or atrophy  Skin: warm and dry, no rash Neuro:  Alert and Oriented x 3, Strength and sensation are intact Psych: euthymic mood, full affect  Wt Readings from Last 3 Encounters:  02/14/18 184 lb (83.5 kg)  08/13/17 187 lb (84.8 kg)  04/13/17 186 lb 9.6 oz (84.6 kg)       Studies/Labs Reviewed:   EKG:  EKG is ordered today.  The ekg ordered today demonstrates NSR with TWI Iateral leads, poor R wave progression in anterior leads  Recent Labs: 10/15/2017: BUN 25; Creatinine, Ser 1.22; Potassium 5.0; Sodium 134   Lipid Panel    Component Value Date/Time   CHOL 96 02/03/2015 0235   TRIG 88 02/03/2015 0235   HDL 32 (L) 02/03/2015 0235   CHOLHDL 3.0 02/03/2015 0235   VLDL 18 02/03/2015 0235   LDLCALC 46 02/03/2015 0235    Additional studies/ records that were reviewed today include:   Echo 08/19/2015 LV EF: 45% -   50% Study Conclusions  - Left ventricle: The cavity size was normal. Wall thickness was   normal. Systolic function was mildly reduced. The estimated   ejection fraction was in the range of 45% to 50%. Doppler   parameters are consistent with abnormal left ventricular   relaxation (grade 1 diastolic dysfunction). - Aortic valve: Mildly calcified annulus. - Right ventricle: The cavity size was mildly dilated. Systolic   function was mildly reduced. - Pulmonary arteries: Systolic pressure was mildly increased. PA   peak pressure: 32 mm Hg (S).   ASSESSMENT:    1. Chest pain at rest   2. CAD S/P percutaneous coronary angioplasty   3. Essential hypertension   4. Hyperlipidemia, unspecified hyperlipidemia type   5. Controlled type 2 diabetes mellitus without complication, with long-term current use of insulin (HCC)   6. Carotid artery disease, unspecified laterality, unspecified type (HCC)      PLAN:  In order of problems listed above:  1. Chest pain: Patient has been having some intermittent chest pain at rest.  I recommended a Lexiscan Myoview  2. CAD: Continue aspirin and lovastatin  3. Hyperlipidemia: On lovastatin.  She will need fasting lipid panel at her PCPs office.  4. DM2: On insulin and metformin.  Managed by primary care provider  5. Hypertension: Blood pressure borderline elevated today, however was normal  during the last office visit  6. Carotid artery disease: Last carotid ultrasound obtained on 01/28/2018 showed mild disease bilaterally.    Medication Adjustments/Labs and Tests Ordered: Current medicines are reviewed at length with the patient today.  Concerns regarding medicines are outlined above.  Medication changes, Labs and Tests ordered today are listed in the Patient Instructions below. Patient Instructions  Medication Instructions:    Continue same medications   If you need a refill on your cardiac medications before your next appointment, please call your pharmacy.   Lab work: Make sure PCP does a lipid and hepatic panels yearly fax Dr.Berry results   Testing/Procedures:  Schedule Lexiscan myoview  Hold Carvedilol 24 hours prior to test   Follow-Up: At [facility], you and your health needs are our priority.  As part of our continuing mission to provide you with exceptional heart care, we have created designated Provider Care Teams.  These Care Teams include your primary Cardiologist (physician) and Advanced Practice Providers (APPs -  Physician Assistants and Nurse Practitioners) who all work together to provide you with the care you need, when you need it.   If myoview normal follow up with Dr.Berry 6 months call 2 months before to schedule If myoview abnormal will need to be seen sooner.        Ramond Dial, [place]  02/16/2018 10:25 PM    [facility] [address] [place], [place], [place]  [address] Phone: [phone]; Fax: [phone]

## 2018-02-14 NOTE — Patient Instructions (Signed)
Medication Instructions:    Continue same medications   If you need a refill on your cardiac medications before your next appointment, please call your pharmacy.   Lab work: Make sure PCP does a lipid and hepatic panels yearly fax Dr.Berry results   Testing/Procedures:  Schedule Lexiscan myoview  Hold Carvedilol 24 hours prior to test   Follow-Up: At [facility], you and your health needs are our priority.  As part of our continuing mission to provide you with exceptional heart care, we have created designated Provider Care Teams.  These Care Teams include your primary Cardiologist (physician) and Advanced Practice Providers (APPs -  Physician Assistants and Nurse Practitioners) who all work together to provide you with the care you need, when you need it.   If myoview normal follow up with Dr.Berry 6 months call 2 months before to schedule If myoview abnormal will need to be seen sooner.

## 2018-02-16 ENCOUNTER — Encounter: Payer: Self-pay | Admitting: Physician Assistant

## 2018-02-16 NOTE — Addendum Note (Signed)
Addended byLisabeth Devoid, Dari Carpenito on: 02/16/2018 10:28 PM   Modules accepted: Level of Service

## 2018-03-03 ENCOUNTER — Telehealth (HOSPITAL_COMMUNITY): Payer: Self-pay

## 2018-03-03 NOTE — Telephone Encounter (Signed)
Encounter complete. 

## 2018-03-08 ENCOUNTER — Ambulatory Visit (HOSPITAL_COMMUNITY)
Admission: RE | Admit: 2018-03-08 | Discharge: 2018-03-08 | Disposition: A | Discharge status: Home / Self Care | Payer: Self-pay | Source: Ambulatory Visit | Attending: Cardiology | Admitting: Cardiology

## 2018-03-08 DIAGNOSIS — Z9861 Coronary angioplasty status: Secondary | ICD-10-CM | POA: Insufficient documentation

## 2018-03-08 DIAGNOSIS — I2510 Atherosclerotic heart disease of native coronary artery without angina pectoris: Secondary | ICD-10-CM | POA: Insufficient documentation

## 2018-03-08 DIAGNOSIS — R079 Chest pain, unspecified: Secondary | ICD-10-CM | POA: Insufficient documentation

## 2018-03-08 LAB — MYOCARDIAL PERFUSION IMAGING
CHL CUP NUCLEAR SRS: 19
CHL CUP NUCLEAR SSS: 19
CSEPPHR: 100 {beats}/min
LV dias vol: 166 mL (ref 46–106)
LVSYSVOL: 114 mL
Rest HR: 67 {beats}/min
SDS: 0
TID: 1.12

## 2018-03-08 MED ORDER — TECHNETIUM TC 99M TETROFOSMIN IV KIT
30.4000 | PACK | Freq: Once | INTRAVENOUS | Status: AC | PRN
  Administered 2018-03-08: 30.4 via INTRAVENOUS
  Filled 2018-03-08: qty 31

## 2018-03-08 MED ORDER — REGADENOSON 0.4 MG/5ML IV SOLN
0.4000 mg | Freq: Once | INTRAVENOUS | Status: AC
  Administered 2018-03-08: 0.4 mg via INTRAVENOUS

## 2018-03-08 MED ORDER — TECHNETIUM TC 99M TETROFOSMIN IV KIT
9.8000 | PACK | Freq: Once | INTRAVENOUS | Status: AC | PRN
  Administered 2018-03-08: 9.8 via INTRAVENOUS
  Filled 2018-03-08: qty 10

## 2018-03-09 ENCOUNTER — Other Ambulatory Visit: Payer: Self-pay

## 2018-03-09 DIAGNOSIS — I2510 Atherosclerotic heart disease of native coronary artery without angina pectoris: Secondary | ICD-10-CM

## 2018-03-09 DIAGNOSIS — Z9861 Coronary angioplasty status: Principal | ICD-10-CM

## 2018-03-09 DIAGNOSIS — R079 Chest pain, unspecified: Secondary | ICD-10-CM

## 2018-03-09 NOTE — Progress Notes (Unsigned)
ech 

## 2018-03-10 NOTE — Progress Notes (Signed)
I have personally called the patient and discussed the result with her and why we needed that echo

## 2018-03-21 ENCOUNTER — Encounter (HOSPITAL_COMMUNITY): Payer: Self-pay

## 2018-03-21 ENCOUNTER — Ambulatory Visit (HOSPITAL_COMMUNITY): Payer: Medicaid Other

## 2018-03-29 ENCOUNTER — Other Ambulatory Visit: Payer: Self-pay

## 2018-03-29 ENCOUNTER — Ambulatory Visit (HOSPITAL_COMMUNITY): Payer: Self-pay | Attending: Cardiology

## 2018-03-29 DIAGNOSIS — R079 Chest pain, unspecified: Secondary | ICD-10-CM | POA: Insufficient documentation

## 2018-03-29 DIAGNOSIS — I2510 Atherosclerotic heart disease of native coronary artery without angina pectoris: Secondary | ICD-10-CM | POA: Insufficient documentation

## 2018-03-29 DIAGNOSIS — Z9861 Coronary angioplasty status: Secondary | ICD-10-CM | POA: Insufficient documentation

## 2018-03-30 ENCOUNTER — Telehealth: Payer: Self-pay | Admitting: Cardiovascular Disease

## 2018-03-30 MED ORDER — LOVASTATIN 20 MG PO TABS: 20.0000 mg | ORAL_TABLET | Freq: Every day | ORAL | 8 refills | Status: DC

## 2018-03-30 NOTE — Telephone Encounter (Signed)
New message:        *STAT* If patient is at the pharmacy, call can be transferred to refill team.   1. Which medications need to be refilled? (please list name of each medication and dose if known) lovastatin (MEVACOR) 20 MG tablet  2. Which pharmacy/location (including street and city if local pharmacy) is medication to be sent to?[facility] [place]- [place], [place] - [address]  3. Do they need a 30 day or 90 day supply? 30

## 2018-03-31 NOTE — Progress Notes (Signed)
Pumping function borderline low, slightly down from previous level, but only by 5%.  Bottom side of heart is not moving well, likely related to previous blockage in the heart. Given reassuring stress test recently, I would not recommend further workup.

## 2018-06-23 ENCOUNTER — Other Ambulatory Visit: Payer: Self-pay | Admitting: Cardiovascular Disease

## 2018-06-23 MED ORDER — LISINOPRIL 40 MG PO TABS: 40.0000 mg | ORAL_TABLET | Freq: Every day | ORAL | 2 refills | Status: DC

## 2018-06-23 NOTE — Telephone Encounter (Signed)
°*  STAT* If patient is at the pharmacy, call can be transferred to refill team.   1. Which medications need to be refilled? (please list name of each medication and dose if known) lisinopril (PRINIVIL,ZESTRIL) 40 MG tablet  2. Which pharmacy/location (including street and city if local pharmacy) is medication to be sent to? [facility] [place], [place] - [address]  3. Do they need a 30 day or 90 day supply? 90 days

## 2018-08-12 ENCOUNTER — Telehealth: Payer: Self-pay

## 2018-08-12 NOTE — Telephone Encounter (Signed)
   Cardiac Questionnaire:    Since your last visit or hospitalization:    1. Have you been having new or worsening chest pain? NO. 'ABOUT THE SAME; LASTS FOR ABOUT A MINUTE A FEELS LIKE CHEST TIGHTNESS; COMES ON WHEN SHE OVERHEATS OR OVEREXERTS HERSELF   2. Have you been having new or worsening shortness of breath? NO 3. Have you been having new or worsening leg swelling, wt gain, or increase in abdominal girth (pants fitting more tightly)? NO   4. Have you had any passing out spells? NO    *A YES to any of these questions would result in the appointment being kept. *If all the answers to these questions are NO, we should indicate that given the current situation regarding the worldwide coronarvirus pandemic, at the recommendation of the CDC, we are looking to limit gatherings in our waiting area, and thus will reschedule their appointment beyond four weeks from today.   _____________   Pt aware of recommendations to f/u in 3 months or set up virtual visit. Pt requests to reschedule and f/u in 3 mos

## 2018-08-16 ENCOUNTER — Ambulatory Visit: Payer: Self-pay | Admitting: Cardiovascular Disease

## 2018-10-04 ENCOUNTER — Telehealth: Payer: Self-pay | Admitting: Cardiovascular Disease

## 2018-10-04 MED ORDER — AMLODIPINE BESYLATE 5 MG PO TABS: 5.0000 mg | ORAL_TABLET | Freq: Every day | ORAL | 3 refills | Status: DC

## 2018-10-04 MED ORDER — CARVEDILOL 12.5 MG PO TABS: 12.5000 mg | ORAL_TABLET | Freq: Two times a day (BID) | ORAL | 3 refills | Status: DC

## 2018-10-04 MED ORDER — LISINOPRIL 40 MG PO TABS: 40.0000 mg | ORAL_TABLET | Freq: Every day | ORAL | 3 refills | Status: DC

## 2018-10-04 NOTE — Telephone Encounter (Signed)
Rx sent to pharmacy as per pt request.

## 2018-10-04 NOTE — Telephone Encounter (Signed)
New message   Pt c/o medication issue:  1. Name of Medication: carvedilol (COREG) 12.5 MG tablet(Expired)   amLODipine (NORVASC) 5 MG tablet   lisinopril (PRINIVIL,ZESTRIL) 40 MG tablet  2. How are you currently taking this medication (dosage and times per day)?n/a   3. Are you having a reaction (difficulty breathing--STAT)?no   4. What is your medication issue? Patient states that she needs new prescriptions for these medications sent to [facility] in [place].

## 2018-12-28 ENCOUNTER — Ambulatory Visit (INDEPENDENT_AMBULATORY_CARE_PROVIDER_SITE_OTHER): Payer: Self-pay | Admitting: Cardiovascular Disease

## 2018-12-28 ENCOUNTER — Encounter: Payer: Self-pay | Admitting: Cardiovascular Disease

## 2018-12-28 ENCOUNTER — Other Ambulatory Visit: Payer: Self-pay

## 2018-12-28 DIAGNOSIS — Z951 Presence of aortocoronary bypass graft: Secondary | ICD-10-CM

## 2018-12-28 DIAGNOSIS — E785 Hyperlipidemia, unspecified: Secondary | ICD-10-CM

## 2018-12-28 DIAGNOSIS — I10 Essential (primary) hypertension: Secondary | ICD-10-CM

## 2018-12-28 MED ORDER — PANTOPRAZOLE SODIUM 40 MG PO TBEC: 40.0000 mg | DELAYED_RELEASE_TABLET | Freq: Every day | ORAL | 3 refills | Status: DC

## 2018-12-28 NOTE — Assessment & Plan Note (Signed)
History of CAD status post RCA intervention in the past with re-intervention 09/07/2014 B.  Because of accelerated angina I performed cardiac catheterization on her 02/01/2015 revealing three-vessel disease with moderate LV dysfunction.  She underwent CABG x3 by Dr. Darcey Nora 02/07/2015 with a LIMA to her LAD, vein to an obtuse marginal branch and PDA.  Several days postop she had a PEA arrest requiring resuscitation, reintubation and placement of an intra-aortic balloon pump.  Ultimately she was discharged home.  EF is remained stable in the 45 to 50% range and a Myoview stress test performed 03/08/2018 showed inferolateral scar without ischemia.  She denies chest pain or shortness of breath but does have nocturnal chest burning while recumbent consistent with reflux.

## 2018-12-28 NOTE — Progress Notes (Signed)
12/28/2018 [name]   07-19-1970  [id number]  Primary Physician Fleet ContrasAvbuere, Edwin, MD Primary Cardiologist: Runell GessJonathan J Fortunata Betty MD FACP, [place]FACC, [place]HA, [place]  HPI:  [name] DryKimberly L [name] is a 48 y.o.  moderately overweight African-American female with history of tobacco abuse, diabetes, hypertension, hyperlipidemia and medication noncompliance.I last saw her in the office  08/13/2017.She's had RCA intervention in the past with re-intervention on 09/07/14. She had accelerated angina and underwent repeat cardiac catheterization by myself 02/01/15 revealing three-vessel disease with moderate LV dysfunction. She'll only underwent coronary artery bypass grafting X 3 by Dr. Kathlee NationsPeter Van Trigt 02/07/15 with a LIMA to LAD, vein to obtuse marginal branch and PDA. Several days postop she had a PEA arrest requiring resuscitation, intubation and placement of an intra-aortic balloon pump. Ultimately she was discharged home. She is recuperating nicely. She did stop smoking. Her most recent 2-D echo performed 08/19/15 revealed EF of 45-50%. Since I saw her back she's remained clinically stable. She does not smoke. She eats a relatively healthy diet according to her. She has had issues with difficult to control hypertension. She did see Azalee CourseHao Meng PA-in the clinic since her last office visit with me who initiated lisinopril with escalating doses.  Since I saw her in the office a year ago she is done well.  Her blood pressures under better control.  She did stop smoking at the time of her index event.  She denies chest pain or shortness of breath although she has developed some nocturnal chest burning while recumbent consistent with reflux.    Current Meds  Medication Sig  . acetaminophen (TYLENOL) 500 MG tablet Take 500 mg by mouth every 6 (six) hours as needed.  Marland Kitchen. amLODipine (NORVASC) 5 MG tablet Take 1 tablet (5 mg total) by mouth daily.  Marland Kitchen. aspirin EC 81 MG tablet Take 81 mg by mouth daily.  . carvedilol (COREG) 12.5  MG tablet Take 1 tablet (12.5 mg total) by mouth 2 (two) times daily. To replace metoprolol  . insulin glargine (LANTUS) 100 UNIT/ML injection Inject 20 Units into the skin at bedtime.   Marland Kitchen. lisinopril (ZESTRIL) 40 MG tablet Take 1 tablet (40 mg total) by mouth daily.  Marland Kitchen. lovastatin (MEVACOR) 20 MG tablet Take 1 tablet (20 mg total) by mouth at bedtime.  . metFORMIN (GLUCOPHAGE) 500 MG tablet Take 1 tablet (500 mg total) by mouth 2 (two) times daily with a meal. Pt will need to establish care with PCP for next refills.  . Omega-3 Fatty Acids (FISH OIL) 1000 MG CAPS Take 1,000 mg by mouth at bedtime.     Allergies  Allergen Reactions  . 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 Marital status: Single    Spouse name: Not on file  . Number of children: 2  . Years of education: Not on file  . Highest education level: Not on file  Occupational History  . Occupation: Scientist, research (medical)CNA    Employer: [employer]  Social Needs  . Financial resource strain: Not on file  . Food insecurity    Worry: Not on file    Inability: Not on file  . Transportation needs    Medical: Not on file    Non-medical: Not on file  Tobacco Use  . Smoking status: Former Smoker    Packs/day: 0.00  Types: Cigarettes    Quit date: 01/10/2015    Years since quitting: 3.9  . Smokeless tobacco: Never Used  Substance and Sexual Activity  . Alcohol use: No    Alcohol/week: 0.0 standard drinks  . Drug use: No  . Sexual activity: Not on file  Lifestyle  . Physical activity    Days per week: Not on file    Minutes per session: Not on file  . Stress: Not on file  Relationships  . Social Herbalist on phone: Not on file    Gets together: Not on file    Attends religious service: Not on file    Active member of club or organization: Not on file    Attends meetings of clubs or  organizations: Not on file    Relationship status: Not on file  . Intimate partner violence    Fear of current or ex partner: Not on file    Emotionally abused: Not on file    Physically abused: Not on file    Forced sexual activity: Not on file  Other Topics Concern  . Not on file  Social History Narrative  . Not on file     Review of Systems: General: negative for chills, fever, night sweats or weight changes.  Cardiovascular: negative for chest pain, dyspnea on exertion, edema, orthopnea, palpitations, paroxysmal nocturnal dyspnea or shortness of breath Dermatological: negative for rash Respiratory: negative for cough or wheezing Urologic: negative for hematuria Abdominal: negative for nausea, vomiting, diarrhea, bright red blood per rectum, melena, or hematemesis Neurologic: negative for visual changes, syncope, or dizziness All other systems reviewed and are otherwise negative except as noted above.    Blood pressure (!) 144/66, pulse 76, temperature (!) 96.3 F (35.7 C), height 5\' 3"  (1.6 m), weight 176 lb (79.8 kg).  General appearance: alert and no distress Neck: no adenopathy, no carotid bruit, no JVD, supple, symmetrical, trachea midline and thyroid not enlarged, symmetric, no tenderness/mass/nodules Lungs: clear to auscultation bilaterally Heart: regular rate and rhythm, S1, S2 normal, no murmur, click, rub or gallop Extremities: extremities normal, atraumatic, no cyanosis or edema Pulses: 2+ and symmetric Skin: Skin color, texture, turgor normal. No rashes or lesions Neurologic: Alert and oriented X 3, normal strength and tone. Normal symmetric reflexes. Normal coordination and gait  EKG sinus rhythm 76 with poor R wave progression, borderline LVH with lateral T wave inversion and left axis deviation.  This is unchanged from her prior EKG.  I personally reviewed this EKG.  ASSESSMENT AND PLAN:   Essential hypertension History of essential hypertension with blood  pressure measured today 144/66.  She is on Norvasc, carvedilol and lisinopril.  Hyperlipidemia with target LDL less than 70 History of hyperlipidemia on statin therapy.  We will recheck a lipid liver profile  S/P CABG x 3 History of CAD status post RCA intervention in the past with re-intervention 09/07/2014 B.  Because of accelerated angina I performed cardiac catheterization on her 02/01/2015 revealing three-vessel disease with moderate LV dysfunction.  She underwent CABG x3 by Dr. Darcey Nora 02/07/2015 with a LIMA to her LAD, vein to an obtuse marginal branch and PDA.  Several days postop she had a PEA arrest requiring resuscitation, reintubation and placement of an intra-aortic balloon pump.  Ultimately she was discharged home.  EF is remained stable in the 45 to 50% range and a Myoview stress test performed 03/08/2018 showed inferolateral scar without ischemia.  She denies chest pain or shortness of breath  but does have nocturnal chest burning while recumbent consistent with reflux.      Runell GessJonathan J. Korri Ask MD FACP,FACC,FAHA, [facility]SCAI 12/28/2018 11:34 AM

## 2018-12-28 NOTE — Assessment & Plan Note (Signed)
History of hyperlipidemia on statin therapy.  We will recheck a lipid liver profile 

## 2018-12-28 NOTE — Patient Instructions (Signed)
Medication Instructions:  Your physician has recommended you make the following change in your medication:  START PROTONIX 40 MG, ONE TABLET BY MOUTH DAILY   If you need a refill on your cardiac medications before your next appointment, please call your pharmacy.   Lab work: Your physician recommends that you return for lab work WITHIN 1 WEEK: FASTING LIPID PANEL  If you have labs (blood work) drawn today and your tests are completely normal, you will receive your results only by: Marland Kitchen MyChart Message (if you have MyChart) OR . A paper copy in the mail If you have any lab test that is abnormal or we need to change your treatment, we will call you to review the results.  Testing/Procedures: NONE  Follow-Up: At [facility], you and your health needs are our priority.  As part of our continuing mission to provide you with exceptional heart care, we have created designated Provider Care Teams.  These Care Teams include your primary Cardiologist (physician) and Advanced Practice Providers (APPs -  Physician Assistants and Nurse Practitioners) who all work together to provide you with the care you need, when you need it. You will need a follow up appointment in 6 months with Almyra Deforest, PA-C and in 12 months with Dr. Quay Burow.  Please call our office 2 months in advance to schedule each appointment.

## 2018-12-28 NOTE — Assessment & Plan Note (Signed)
History of essential hypertension with blood pressure measured today 144/66.  She is on Norvasc, carvedilol and lisinopril.

## 2019-01-18 ENCOUNTER — Other Ambulatory Visit: Payer: Self-pay | Admitting: Physician Assistant

## 2019-08-21 ENCOUNTER — Other Ambulatory Visit: Payer: Self-pay | Admitting: Cardiovascular Disease

## 2019-08-21 NOTE — Telephone Encounter (Signed)
*  STAT* If patient is at the pharmacy, call can be transferred to refill team.   1. Which medications need to be refilled? (please list name of each medication and dose if known)  lisinopril (ZESTRIL) 40 MG tablet carvedilol (COREG) 12.5 MG tablet lovastatin (MEVACOR) 20 MG tablet  2. Which pharmacy/location (including street and city if local pharmacy) is medication to be sent to?  [facility] - [place] ([place] - [address]  3. Do they need a 30 day or 90 day supply? 30

## 2019-08-22 MED ORDER — LOVASTATIN 20 MG PO TABS: 20.0000 mg | ORAL_TABLET | Freq: Every day | ORAL | 3 refills | Status: DC

## 2019-08-22 MED ORDER — CARVEDILOL 12.5 MG PO TABS: 12.5000 mg | ORAL_TABLET | Freq: Two times a day (BID) | ORAL | 0 refills | Status: DC

## 2019-08-22 MED ORDER — LISINOPRIL 40 MG PO TABS: 40.0000 mg | ORAL_TABLET | Freq: Every day | ORAL | 0 refills | Status: DC

## 2019-10-25 ENCOUNTER — Other Ambulatory Visit: Payer: Self-pay | Admitting: Cardiovascular Disease

## 2019-10-27 ENCOUNTER — Other Ambulatory Visit: Payer: Self-pay

## 2019-12-23 ENCOUNTER — Other Ambulatory Visit: Payer: Self-pay | Admitting: Cardiovascular Disease

## 2020-01-23 ENCOUNTER — Other Ambulatory Visit: Payer: Self-pay | Admitting: Cardiovascular Disease

## 2020-01-29 ENCOUNTER — Other Ambulatory Visit: Payer: Self-pay | Admitting: Cardiovascular Disease

## 2020-01-31 ENCOUNTER — Other Ambulatory Visit: Payer: Self-pay | Admitting: Cardiovascular Disease

## 2020-01-31 ENCOUNTER — Telehealth: Payer: Self-pay | Admitting: Cardiovascular Disease

## 2020-01-31 NOTE — Telephone Encounter (Signed)
New message    *STAT* If patient is at the pharmacy, call can be transferred to refill team.   1. Which medications need to be refilled? (please list name of each medication and dose if known) carvedilol (COREG) 12.5 MG tablet [[id number]]    2. Which pharmacy/location (including street and city if local pharmacy) is medication to be sent to?  [facility] - [place] ([place] - [address] Phone:  [phone]  Fax:  [phone]       3. Do they need a 30 day or 90 day supply? 30

## 2020-02-01 NOTE — Telephone Encounter (Signed)
Pt OD for 6 month f/u. Please contact pt for future appointment. 

## 2020-02-01 NOTE — Telephone Encounter (Signed)
Per pt call stated she is now completely out of her Medication and would like a call when refill is done please.

## 2020-02-01 NOTE — Telephone Encounter (Signed)
Please contact pt for future appointment overdue for 12 month f/u.

## 2020-02-13 ENCOUNTER — Telehealth: Payer: Self-pay | Admitting: Cardiovascular Disease

## 2020-02-13 NOTE — Telephone Encounter (Signed)
  LVMTCB to scheduled yearly follow up so that medication can be refilled

## 2020-02-21 ENCOUNTER — Other Ambulatory Visit: Payer: Self-pay | Admitting: Cardiovascular Disease

## 2020-03-01 ENCOUNTER — Other Ambulatory Visit: Payer: Self-pay | Admitting: Cardiovascular Disease

## 2020-03-20 ENCOUNTER — Other Ambulatory Visit: Payer: Self-pay | Admitting: Cardiovascular Disease

## 2020-04-05 ENCOUNTER — Other Ambulatory Visit: Payer: Self-pay | Admitting: Cardiovascular Disease

## 2020-04-16 ENCOUNTER — Encounter: Payer: Self-pay | Admitting: Cardiovascular Disease

## 2020-04-16 ENCOUNTER — Ambulatory Visit (INDEPENDENT_AMBULATORY_CARE_PROVIDER_SITE_OTHER): Payer: Self-pay | Admitting: Cardiovascular Disease

## 2020-04-16 ENCOUNTER — Other Ambulatory Visit: Payer: Self-pay

## 2020-04-16 DIAGNOSIS — I255 Ischemic cardiomyopathy: Secondary | ICD-10-CM | POA: Insufficient documentation

## 2020-04-16 DIAGNOSIS — E785 Hyperlipidemia, unspecified: Secondary | ICD-10-CM

## 2020-04-16 DIAGNOSIS — I10 Essential (primary) hypertension: Secondary | ICD-10-CM

## 2020-04-16 DIAGNOSIS — I25110 Atherosclerotic heart disease of native coronary artery with unstable angina pectoris: Secondary | ICD-10-CM

## 2020-04-16 NOTE — Assessment & Plan Note (Signed)
History of essential hypertension a blood pressure measured today 152/80.  She is on amlodipine, lisinopril and carvedilol.

## 2020-04-16 NOTE — Assessment & Plan Note (Signed)
History of hyperlipidemia on lovastatin followed by her PCP 

## 2020-04-16 NOTE — Assessment & Plan Note (Signed)
History of CAD status post RCA intervention in the past and reintervention 09/07/2014.  Ultimately she underwent recatheterization because of accelerated angina 02/01/2015 revealing three-vessel disease and moderate LV dysfunction.  She underwent coronary artery bypass grafting x3 by Dr. Lovett Sox 02/07/2015 with a LIMA to her LAD, vein to an obtuse marginal branch and PDA.  She developed PEA several days postop requiring resuscitation, intubation and placement of an intra-aortic balloon pump.  Ultimately she was discharged home in recuperated.  She did have a Myoview stress test performed 03/08/2018 that showed inferolateral scar without ischemia.  She denies chest pain.

## 2020-04-16 NOTE — Assessment & Plan Note (Signed)
Recent 2D echo performed 03/29/2018 revealed an EF of 40 to 45%.  She is on a appropriate medications including carvedilol and lisinopril.  She denies symptoms of heart failure.

## 2020-04-16 NOTE — Progress Notes (Signed)
04/16/2020 [name]   [birth year]  [id number]  Primary Physician Fleet Contras, MD Primary Cardiologist: Runell Gess MD FACP, [place], [place], [place]  HPI:  [name] is a 49 y.o.  moderately overweight African-American female with history of tobacco abuse, diabetes, hypertension, hyperlipidemia and medication noncompliance.I last saw her in the office  12/28/2018.She's had RCA intervention in the past with re-intervention on 09/07/14. She had accelerated angina and underwent repeat cardiac catheterization by myself 02/01/15 revealing three-vessel disease with moderate LV dysfunction.  She underwent coronary artery bypass grafting X 3 by Dr. Kathlee Nations Trigt 02/07/15 with a LIMA to LAD, vein to obtuse marginal branch and PDA. Several days postop she had aPEAarrest requiring resuscitation, intubation and placement of an intra-aortic balloon pump. Ultimately she was discharged home. She is recuperating nicely. She did stop smoking. Her most recent 2-D echo performed 08/19/15 revealed EF of 45-50%. Since I saw her back she's remained clinically stable. She does not smoke. She eats a relatively healthy diet according to her.  She has had issues with difficult to control hypertension. She did see Azalee Course PA-in the clinic since her last office visit with me who initiated lisinopril with escalating doses.  Since I saw her in the office a year and a half ago she is done well.  She was having recumbent chest burning which I thought was reflux and this improved with the addition of a PPI.  A 2D echocardiogram performed 03/29/2018 showed an EF of 40 to 45%.  This is consistent with her Myoview performed at that time that showed inferolateral scar.  She did stop smoking at the time of her bypass surgery.   Current Meds  Medication Sig  . acetaminophen (TYLENOL) 500 MG tablet Take 500 mg by mouth every 6 (six) hours as needed.  Marland Kitchen amLODipine (NORVASC) 5 MG tablet Take 1 tablet by mouth once  daily  . aspirin EC 81 MG tablet Take 81 mg by mouth daily.  . carvedilol (COREG) 12.5 MG tablet Take 1 tablet (12.5 mg total) by mouth 2 (two) times daily with a meal.  . insulin glargine (LANTUS) 100 UNIT/ML injection Inject 20 Units into the skin at bedtime.   Marland Kitchen lisinopril (ZESTRIL) 40 MG tablet Take 1 tablet by mouth daily  . lovastatin (MEVACOR) 40 MG tablet Take 40 mg by mouth at bedtime.  . metFORMIN (GLUCOPHAGE) 500 MG tablet Take 1 tablet (500 mg total) by mouth 2 (two) times daily with a meal. Pt will need to establish care with PCP for next refills.  . Omega-3 Fatty Acids (FISH OIL) 1000 MG CAPS Take 1,000 mg by mouth at bedtime.  . pantoprazole (PROTONIX) 40 MG tablet TAKE 1 TABLET BY MOUTH ONCE DAILY . APPOINTMENT REQUIRED FOR FUTURE REFILLS  . [DISCONTINUED] lovastatin (MEVACOR) 20 MG tablet TAKE 1 TABLET BY MOUTH AT BEDTIME ( PLEASE SCHEDULE AN APPOINTMENT FOR FUTURE REFILLS )     Allergies  Allergen Reactions  . 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 Marital status: Single    Spouse name: Not on file  . Number of children: 2  . Years of education: Not on file  . Highest education level: Not on file  Occupational History  . Occupation: Scientist, research (medical): [employer]  Tobacco Use  . Smoking status:  Former Smoker    Packs/day: 0.00    Types: Cigarettes    Quit date: 01/10/2015    Years since quitting: 5.2  . Smokeless tobacco: Never Used  Substance and Sexual Activity  . Alcohol use: No    Alcohol/week: 0.0 standard drinks  . Drug use: No  . Sexual activity: Not on file  Other Topics Concern  . Not on file  Social History Narrative  . Not on file   Social Determinants of Health   Financial Resource Strain:   . Difficulty of Paying Living Expenses: Not on file  Food Insecurity:   . Worried About Programme researcher, broadcasting/film/video in the Last  Year: Not on file  . Ran Out of Food in the Last Year: Not on file  Transportation Needs:   . Lack of Transportation (Medical): Not on file  . Lack of Transportation (Non-Medical): Not on file  Physical Activity:   . Days of Exercise per Week: Not on file  . Minutes of Exercise per Session: Not on file  Stress:   . Feeling of Stress : Not on file  Social Connections:   . Frequency of Communication with Friends and Family: Not on file  . Frequency of Social Gatherings with Friends and Family: Not on file  . Attends Religious Services: Not on file  . Active Member of Clubs or Organizations: Not on file  . Attends Banker Meetings: Not on file  . Marital Status: Not on file  Intimate Partner Violence:   . Fear of Current or Ex-Partner: Not on file  . Emotionally Abused: Not on file  . Physically Abused: Not on file  . Sexually Abused: Not on file     Review of Systems: General: negative for chills, fever, night sweats or weight changes.  Cardiovascular: negative for chest pain, dyspnea on exertion, edema, orthopnea, palpitations, paroxysmal nocturnal dyspnea or shortness of breath Dermatological: negative for rash Respiratory: negative for cough or wheezing Urologic: negative for hematuria Abdominal: negative for nausea, vomiting, diarrhea, bright red blood per rectum, melena, or hematemesis Neurologic: negative for visual changes, syncope, or dizziness All other systems reviewed and are otherwise negative except as noted above.    Blood pressure (!) 152/80, pulse 69, height 5\' 3"  (1.6 m), weight 175 lb 12.8 oz (79.7 kg).  General appearance: alert and no distress Neck: no adenopathy, no carotid bruit, no JVD, supple, symmetrical, trachea midline and thyroid not enlarged, symmetric, no tenderness/mass/nodules Lungs: clear to auscultation bilaterally Heart: regular rate and rhythm, S1, S2 normal, no murmur, click, rub or gallop Extremities: extremities normal,  atraumatic, no cyanosis or edema Pulses: 2+ and symmetric Skin: Skin color, texture, turgor normal. No rashes or lesions Neurologic: Alert and oriented X 3, normal strength and tone. Normal symmetric reflexes. Normal coordination and gait  EKG sinus rhythm at 69 with inferolateral T wave inversion unchanged from prior EKGs.  I personally reviewed this EKG.  ASSESSMENT AND PLAN:   Essential hypertension History of essential hypertension a blood pressure measured today 152/80.  She is on amlodipine, lisinopril and carvedilol.  Hyperlipidemia with target LDL less than 70 History of hyperlipidemia on lovastatin followed by her PCP  Coronary artery disease involving native coronary artery with unstable angina pectoris History of CAD status post RCA intervention in the past and reintervention 09/07/2014.  Ultimately she underwent recatheterization because of accelerated angina 02/01/2015 revealing three-vessel disease and moderate LV dysfunction.  She underwent coronary artery bypass grafting x3 by Dr. 02/03/2015  02/07/2015 with a LIMA to her LAD, vein to an obtuse marginal branch and PDA.  She developed PEA several days postop requiring resuscitation, intubation and placement of an intra-aortic balloon pump.  Ultimately she was discharged home in recuperated.  She did have a Myoview stress test performed 03/08/2018 that showed inferolateral scar without ischemia.  She denies chest pain.  Ischemic cardiomyopathy Recent 2D echo performed 03/29/2018 revealed an EF of 40 to 45%.  She is on a appropriate medications including carvedilol and lisinopril.  She denies symptoms of heart failure.      Runell Gess MD FACP,FACC,FAHA, [facility] 04/16/2020 4:46 PM

## 2020-04-16 NOTE — Patient Instructions (Signed)

## 2020-04-17 ENCOUNTER — Other Ambulatory Visit: Payer: Self-pay | Admitting: Cardiology

## 2020-04-22 ENCOUNTER — Other Ambulatory Visit: Payer: Self-pay | Admitting: Cardiovascular Disease

## 2020-05-23 ENCOUNTER — Other Ambulatory Visit: Payer: Self-pay | Admitting: Cardiology

## 2021-03-30 ENCOUNTER — Other Ambulatory Visit: Payer: Self-pay | Admitting: Cardiovascular Disease

## 2021-04-11 ENCOUNTER — Other Ambulatory Visit: Payer: Self-pay

## 2021-04-11 ENCOUNTER — Ambulatory Visit (INDEPENDENT_AMBULATORY_CARE_PROVIDER_SITE_OTHER): Payer: 59 | Admitting: Cardiovascular Disease

## 2021-04-11 ENCOUNTER — Encounter: Payer: Self-pay | Admitting: Cardiovascular Disease

## 2021-04-11 DIAGNOSIS — E785 Hyperlipidemia, unspecified: Secondary | ICD-10-CM

## 2021-04-11 DIAGNOSIS — I10 Essential (primary) hypertension: Secondary | ICD-10-CM

## 2021-04-11 DIAGNOSIS — I255 Ischemic cardiomyopathy: Secondary | ICD-10-CM

## 2021-04-11 DIAGNOSIS — Z951 Presence of aortocoronary bypass graft: Secondary | ICD-10-CM | POA: Diagnosis not present

## 2021-04-11 MED ORDER — NITROGLYCERIN 0.4 MG SL SUBL: 0.4000 mg | SUBLINGUAL_TABLET | SUBLINGUAL | 3 refills | Status: DC | PRN

## 2021-04-11 NOTE — Assessment & Plan Note (Signed)
History of essential hypertension a blood pressure measured today 144/80.  She is on amlodipine, carvedilol and lisinopril.

## 2021-04-11 NOTE — Assessment & Plan Note (Signed)
History of hyperlipidemia on statin therapy followed by her PCP. 

## 2021-04-11 NOTE — Progress Notes (Signed)
04/11/2021 [name]   1970-11-02  [id number]  Primary Physician Fleet Contras, MD Primary Cardiologist: Runell Gess MD FACP, [place], [place], [place]  HPI:  [name] is a 50 y.o.  moderately overweight African-American female with history of tobacco abuse, diabetes, hypertension, hyperlipidemia and medication noncompliance. I last saw her in the office   04/16/2020. She's had RCA intervention in the past with re-intervention on 09/07/14. She had accelerated angina and underwent repeat cardiac catheterization by myself 02/01/15 revealing three-vessel disease with moderate LV dysfunction.  She underwent coronary artery bypass grafting X 3 by Dr. Kathlee Nations Trigt 02/07/15 with a LIMA to LAD, vein to obtuse marginal branch and PDA. Several days postop she had a PEA arrest requiring resuscitation, intubation and placement of an intra-aortic balloon pump. Ultimately she was discharged home. She is recuperating nicely. She did stop smoking. Her most recent 2-D echo performed 08/19/15 revealed EF of 45-50%. Since I saw her back she's remained clinically stable. She does not smoke. She eats a relatively healthy diet according to her.   She has had issues with difficult to control hypertension. She did see Azalee Course PA-in the clinic since her last office visit with me who initiated lisinopril with escalating doses.   Since I saw her in the office a year ago she continues to do well.  She did stop smoking at the time of her bypass surgery.  She denies chest pain or shortness of breath.  Dr. Concepcion Elk  checks her lipid profile.   Current Meds  Medication Sig   acetaminophen (TYLENOL) 500 MG tablet Take 500 mg by mouth every 6 (six) hours as needed.   amLODipine (NORVASC) 5 MG tablet Take 1 tablet by mouth once daily   aspirin EC 81 MG tablet Take 81 mg by mouth daily.   carvedilol (COREG) 12.5 MG tablet TAKE 1 TABLET BY MOUTH TWICE DAILY WITH  A  MEAL.   insulin glargine (LANTUS) 100 UNIT/ML  injection Inject 20 Units into the skin at bedtime.    lisinopril (ZESTRIL) 40 MG tablet Take 1 tablet by mouth daily   lovastatin (MEVACOR) 40 MG tablet Take 40 mg by mouth at bedtime.   metFORMIN (GLUCOPHAGE) 500 MG tablet Take 1 tablet (500 mg total) by mouth 2 (two) times daily with a meal. Pt will need to establish care with PCP for next refills.   Omega-3 Fatty Acids (FISH OIL) 1000 MG CAPS Take 1,000 mg by mouth at bedtime.   pantoprazole (PROTONIX) 40 MG tablet Take 1 tablet (40 mg total) by mouth daily.     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 - Patient should NOT receive heparin (02/10/15 and 02/12/15)    Social History   Socioeconomic History   Marital status: Single    Spouse name: Not on file   Number of children: 2   Years of education: Not on file   Highest education level: Not on file  Occupational History   Occupation: CNA    Employer: [employer]  Tobacco Use   Smoking status: Former    Packs/day: 0.00    Types: Cigarettes    Quit date: 01/10/2015    Years since quitting: 6.2   Smokeless tobacco: Never  Substance and Sexual Activity   Alcohol use: No    Alcohol/week: 0.0 standard drinks   Drug use: No   Sexual activity: Not on file  Other Topics Concern   Not on file  Social History Narrative   Not on file   Social Determinants of Health   Financial Resource Strain: Not on file  Food Insecurity: Not on file  Transportation Needs: Not on file  Physical Activity: Not on file  Stress: Not on file  Social Connections: Not on file  Intimate Partner Violence: Not on file     Review of Systems: General: negative for chills, fever, night sweats or weight changes.  Cardiovascular: negative for chest pain, dyspnea on exertion, edema, orthopnea, palpitations, paroxysmal nocturnal dyspnea or shortness of breath Dermatological: negative for rash Respiratory: negative for cough or wheezing Urologic:  negative for hematuria Abdominal: negative for nausea, vomiting, diarrhea, bright red blood per rectum, melena, or hematemesis Neurologic: negative for visual changes, syncope, or dizziness All other systems reviewed and are otherwise negative except as noted above.    Blood pressure (!) 144/80, pulse 84, height 5\' 3"  (1.6 m), weight 177 lb 3.2 oz (80.4 kg), SpO2 99 %.  General appearance: alert and no distress Neck: no adenopathy, no carotid bruit, no JVD, supple, symmetrical, trachea midline, and thyroid not enlarged, symmetric, no tenderness/mass/nodules Lungs: clear to auscultation bilaterally Heart: regular rate and rhythm, S1, S2 normal, no murmur, click, rub or gallop Extremities: extremities normal, atraumatic, no cyanosis or edema Pulses: 2+ and symmetric Skin: Skin color, texture, turgor normal. No rashes or lesions Neurologic: Grossly normal  EKG not performed today  ASSESSMENT AND PLAN:   Essential hypertension History of essential hypertension a blood pressure measured today 144/80.  She is on amlodipine, carvedilol and lisinopril.  Hyperlipidemia with target LDL less than 70 History of hyperlipidemia on statin therapy followed by her PCP  S/P CABG x 3 History of CAD status post cardiac catheterization by myself 02/01/2015 revealing three-vessel disease with moderate LV dysfunction.  She underwent CABG x3 by Dr. Darcey Nora 02/07/2015 with a LIMA to her LAD, vein to an obtuse marginal branch and PDA.  Several days postop she had PEA arrest requiring resuscitation, intubation and placement of an intra-aortic balloon pump.  Ultimately she was discharged home.  She denies chest pain or shortness of breath.  Ischemic cardiomyopathy History of ischemic cardiomyopathy with ejection fraction of 40 to 45% by echo performed 03/29/2018.  I am going to recheck a 2D echocardiogram.     Lorretta Harp MD [facility], [facility] 04/11/2021 11:52 AM

## 2021-04-11 NOTE — Patient Instructions (Signed)
Medication Instructions:  Your physician recommends that you continue on your current medications as directed. Please refer to the Current Medication list given to you today.  *If you need a refill on your cardiac medications before your next appointment, please call your pharmacy*   Testing/Procedures: Your physician has requested that you have an echocardiogram. Echocardiography is a painless test that uses sound waves to create images of your heart. It provides your doctor with information about the size and shape of your heart and how well your heart's chambers and valves are working. This procedure takes approximately one hour. There are no restrictions for this procedure. This procedure is done at [address]   Follow-Up: At [facility], you and your health needs are our priority.  As part of our continuing mission to provide you with exceptional heart care, we have created designated Provider Care Teams.  These Care Teams include your primary Cardiologist (physician) and Advanced Practice Providers (APPs -  Physician Assistants and Nurse Practitioners) who all work together to provide you with the care you need, when you need it.  We recommend signing up for the patient portal called "MyChart".  Sign up information is provided on this After Visit Summary.  MyChart is used to connect with patients for Virtual Visits (Telemedicine).  Patients are able to view lab/test results, encounter notes, upcoming appointments, etc.  Non-urgent messages can be sent to your provider as well.   To learn more about what you can do with MyChart, go to [email or web address].    Your next appointment:   12 month(s)  The format for your next appointment:   In Person  Provider:   Jonathan Berry, MD 

## 2021-04-11 NOTE — Assessment & Plan Note (Signed)
History of CAD status post cardiac catheterization by myself 02/01/2015 revealing three-vessel disease with moderate LV dysfunction.  She underwent CABG x3 by Dr. Maren Beach 02/07/2015 with a LIMA to her LAD, vein to an obtuse marginal branch and PDA.  Several days postop she had PEA arrest requiring resuscitation, intubation and placement of an intra-aortic balloon pump.  Ultimately she was discharged home.  She denies chest pain or shortness of breath.

## 2021-04-11 NOTE — Assessment & Plan Note (Signed)
History of ischemic cardiomyopathy with ejection fraction of 40 to 45% by echo performed 03/29/2018.  I am going to recheck a 2D echocardiogram.

## 2021-04-29 ENCOUNTER — Other Ambulatory Visit: Payer: Self-pay | Admitting: Cardiovascular Disease

## 2021-04-29 ENCOUNTER — Other Ambulatory Visit: Payer: Self-pay | Admitting: Cardiology

## 2021-05-06 ENCOUNTER — Ambulatory Visit (HOSPITAL_COMMUNITY): Payer: 59 | Attending: Cardiovascular Disease

## 2021-05-06 ENCOUNTER — Other Ambulatory Visit: Payer: Self-pay

## 2021-05-06 DIAGNOSIS — I10 Essential (primary) hypertension: Secondary | ICD-10-CM

## 2021-05-06 DIAGNOSIS — Z951 Presence of aortocoronary bypass graft: Secondary | ICD-10-CM | POA: Diagnosis present

## 2021-05-06 DIAGNOSIS — I255 Ischemic cardiomyopathy: Secondary | ICD-10-CM | POA: Diagnosis present

## 2021-05-06 DIAGNOSIS — E785 Hyperlipidemia, unspecified: Secondary | ICD-10-CM

## 2021-05-06 LAB — ECHOCARDIOGRAM COMPLETE
Area-P 1/2: 8.07 cm2
S' Lateral: 4.9 cm

## 2021-06-22 ENCOUNTER — Other Ambulatory Visit: Payer: Self-pay | Admitting: Cardiovascular Disease

## 2021-07-17 ENCOUNTER — Encounter (HOSPITAL_COMMUNITY): Payer: Self-pay | Admitting: Physician Assistant

## 2021-07-17 ENCOUNTER — Other Ambulatory Visit: Payer: Self-pay | Admitting: Neurosurgery

## 2021-07-17 ENCOUNTER — Telehealth: Payer: Self-pay | Admitting: Cardiovascular Disease

## 2021-07-17 NOTE — Telephone Encounter (Signed)
I tried to reach the pt to offer her a tele pre op visit. Left message if she would like to have a tele pre op visit Monday to call back [phone] and ask to s/w the pre op team, so that we may schedule the tele pre op appt.  ?

## 2021-07-17 NOTE — Telephone Encounter (Signed)
Primary Cardiologist:[name], MD ? ?Chart reviewed as part of pre-operative protocol coverage. Because of [name]'s past medical history and time since last visit, he/she will require a virtual visit/telephone call in order to better assess preoperative cardiovascular risk. ? ?Pre-op covering staff: ?- Please contact patient, obtain consent, and schedule appointment  ? ?If applicable, this message will also be routed to pharmacy pool and/or primary cardiologist for input on holding anticoagulant/antiplatelet agent as requested below so that this information is available at time of patient's appointment.  ? ?[name], NP-C ? ?  ?07/17/2021, 3:38 PM ?[facility] ?[address], [address] ?Office [phone] Fax [phone] ? ?

## 2021-07-17 NOTE — Telephone Encounter (Signed)
? ?  Pre-operative Risk Assessment  ?  ?Patient Name: [name]  ?DOB: Apr 04, 1971 ?MRN: [id number]  ? ?  ? ?Request for Surgical Clearance   ? ?Procedure:   Cervical Fusion  ? ?Date of Surgery:  Clearance 07/17/21                              ?   ?Surgeon:  Dr. Conchita Paris ?Surgeon's Group or Practice Name:  [facility]  ?Phone number:  [phone] ?Fax number:  [phone] ?  ?Type of Clearance Requested:   ?- Medical  ?  ?Type of Anesthesia:  General  ?  ?Additional requests/questions:   ? ?Signed, ?Brooks Sailors   ?07/17/2021, 3:24 PM   ?

## 2021-07-18 ENCOUNTER — Ambulatory Visit (HOSPITAL_COMMUNITY)
Admission: RE | Admit: 2021-07-18 | Payer: Managed Care, Other (non HMO) | Source: Home / Self Care | Admitting: Neurosurgery

## 2021-07-18 ENCOUNTER — Encounter (HOSPITAL_COMMUNITY): Admission: RE | Payer: Self-pay | Source: Home / Self Care

## 2021-07-18 ENCOUNTER — Telehealth: Payer: Self-pay | Admitting: *Deleted

## 2021-07-18 SURGERY — ANTERIOR CERVICAL DECOMPRESSION/DISCECTOMY FUSION 1 LEVEL
Anesthesia: General

## 2021-07-18 NOTE — Telephone Encounter (Signed)
?  Patient Consent for Virtual Visit  ? ? ?   ? ?[name] has provided verbal consent on 07/18/2021 for a virtual visit (video or telephone). ? ? ?CONSENT FOR VIRTUAL VISIT FOR:  [name]  ?By participating in this virtual visit I agree to the following: ? ?I hereby voluntarily request, consent and authorize [facility] and its employed or contracted physicians, physician assistants, nurse practitioners or other licensed health care professionals (the Practitioner), to provide me with telemedicine health care services (the ?Services") as deemed necessary by the treating Practitioner. I acknowledge and consent to receive the Services by the Practitioner via telemedicine. I understand that the telemedicine visit will involve communicating with the Practitioner through live audiovisual communication technology and the disclosure of certain medical information by electronic transmission. I acknowledge that I have been given the opportunity to request an in-person assessment or other available alternative prior to the telemedicine visit and am voluntarily participating in the telemedicine visit. ? ?I understand that I have the right to withhold or withdraw my consent to the use of telemedicine in the course of my care at any time, without affecting my right to future care or treatment, and that the Practitioner or I may terminate the telemedicine visit at any time. I understand that I have the right to inspect all information obtained and/or recorded in the course of the telemedicine visit and may receive copies of available information for a reasonable fee.  I understand that some of the potential risks of receiving the Services via telemedicine include:  ?Delay or interruption in medical evaluation due to technological equipment failure or disruption; ?Information transmitted may not be sufficient (e.g. poor resolution of images) to allow for appropriate medical decision making by the Practitioner;  and/or  ?In rare instances, security protocols could fail, causing a breach of personal health information. ? ?Furthermore, I acknowledge that it is my responsibility to provide information about my medical history, conditions and care that is complete and accurate to the best of my ability. I acknowledge that Practitioner's advice, recommendations, and/or decision may be based on factors not within their control, such as incomplete or inaccurate data provided by me or distortions of diagnostic images or specimens that may result from electronic transmissions. I understand that the practice of medicine is not an exact science and that Practitioner makes no warranties or guarantees regarding treatment outcomes. I acknowledge that a copy of this consent can be made available to me via my patient portal [facility] MyChart), or I can request a printed copy by calling the office of [facility].   ? ?I understand that my insurance will be billed for this visit.  ? ?I have read or had this consent read to me. ?I understand the contents of this consent, which adequately explains the benefits and risks of the Services being provided via telemedicine.  ?I have been provided ample opportunity to ask questions regarding this consent and the Services and have had my questions answered to my satisfaction. ?I give my informed consent for the services to be provided through the use of telemedicine in my medical care ? ? ? ?

## 2021-07-22 ENCOUNTER — Other Ambulatory Visit: Payer: Self-pay

## 2021-07-22 ENCOUNTER — Ambulatory Visit (INDEPENDENT_AMBULATORY_CARE_PROVIDER_SITE_OTHER): Payer: Managed Care, Other (non HMO) | Admitting: Physician Assistant

## 2021-07-22 DIAGNOSIS — Z01810 Encounter for preprocedural cardiovascular examination: Secondary | ICD-10-CM | POA: Diagnosis not present

## 2021-07-22 NOTE — Progress Notes (Addendum)
? ?Virtual Visit via Telephone Note  ? ?This visit type was conducted due to national recommendations for restrictions regarding the COVID-19 Pandemic (e.g. social distancing) in an effort to limit this patient's exposure and mitigate transmission in our community.  Due to her co-morbid illnesses, this patient is at least at moderate risk for complications without adequate follow up.  This format is felt to be most appropriate for this patient at this time.  The patient did not have access to video technology/had technical difficulties with video requiring transitioning to audio format only (telephone).  All issues noted in this document were discussed and addressed.  No physical exam could be performed with this format.  Please refer to the patient's chart for her  consent to telehealth for [facility]. ?Evaluation Performed:  Preoperative cardiovascular risk assessment ? ?This visit type was conducted due to national recommendations for restrictions regarding the COVID-19 Pandemic (e.g. social distancing).  This format is felt to be most appropriate for this patient at this time.  All issues noted in this document were discussed and addressed.  No physical exam was performed (except for noted visual exam findings with Video Visits).  Please refer to the patient's chart (MyChart message for video visits and phone note for telephone visits) for the patient's consent to telehealth for [facility]. ?_____________  ? ?Date:  07/22/2021  ? ?Patient ID:  [name], DOB [birth year], MRN [id number] ?Patient Location:  ?Home ?Provider location:   ?Office ? ?Primary Care Provider:  Fleet Contras, MD ?Primary Cardiologist:  [name], MD ? ?Chief Complaint  ?  ?51 y.o. 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 ? ?Past  Medical History  ?  ?Past Medical History:  ?Diagnosis Date  ? CAD (coronary artery disease)   ? cath 09/07/2014 DES to 95% prox RCA, residual 70-80% origin of AV groove, 90% ostial PDA stenosis; 01/2015, NSTEMI w/ CABG, LIMA-LAD, SVG-OM1, SVG-PCA  ? Carotid artery disease (HCC)   ? a. 40-59% BICA by duplex 01/2015.  ? Diabetes mellitus without complication (HCC)   ? H/O medication noncompliance   ? Hyperlipidemia   ? Hypertension   ? MI (myocardial infarction) (HCC)   ? Tobacco abuse   ? ?Past Surgical History:  ?Procedure Laterality Date  ? ABDOMINAL HYSTERECTOMY    ? CARDIAC CATHETERIZATION N/A 02/01/2015  ? Procedure: Left Heart Cath and Coronary Angiography;  Surgeon: Runell Gess, MD;  [facility] INVASIVE CV LAB;  Service: Cardiovascular;  Laterality: N/A;  ? CARDIAC CATHETERIZATION N/A 02/07/2015  ? Procedure: IABP Insertion;  Surgeon: Dolores Patty, MD;  Location: [facility] INVASIVE CV LAB;  Service: Cardiovascular;  Laterality: N/A;  ? CORONARY ARTERY BYPASS GRAFT N/A 02/07/2015  ? Procedure: CORONARY ARTERY BYPASS GRAFTING (CABG);  Surgeon: Kerin Perna, MD; LIMA-LAD, SVT-OM1, SVG-PDA  ? LEFT HEART CATHETERIZATION WITH CORONARY ANGIOGRAM N/A 05/29/2014  ? Procedure: LEFT HEART CATHETERIZATION WITH CORONARY ANGIOGRAM;  Surgeon: Corky Crafts, MD;  Location: [facility] CATH LAB;  Service: Cardiovascular;  Laterality: N/A;  ? LEFT HEART CATHETERIZATION WITH CORONARY ANGIOGRAM N/A 09/07/2014  ? Procedure: LEFT HEART CATHETERIZATION WITH CORONARY ANGIOGRAM;  Surgeon: Marykay Lex, MD;  Location[facility] CATH LAB;  Service: Cardiovascular;  Laterality: N/A;  ? PERCUTANEOUS CORONARY STENT INTERVENTION (PCI-S)  05/29/2014  ? Procedure: PERCUTANEOUS CORONARY STENT INTERVENTION (PCI-S);  Surgeon: Renelda Loma  Hoyle BarrS Varanasi, MD;  [facility];  Service: Cardiovascular;;  ? TEE WITHOUT CARDIOVERSION N/A 02/07/2015  ? Procedure: TRANSESOPHAGEAL ECHOCARDIOGRAM (TEE);  Surgeon: Kerin PernaPeter Van Trigt, MD;  [facility];   Service: Open Heart Surgery;  Laterality: N/A;  ? ? ?Allergies ? ?Allergies  ?Allergen Reactions  ? Heparin Other (See Comments)  ?  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 ? ?History of Present Illness  ?  ?[name] is a 51 y.o. female who presents via audio/video conferencing for a telehealth visit today.  Pt was last seen in cardiology clinic on 04/11/2021, by Dr. Nanetta BattyJonathan Berry.  At that time [name] DryKimberly L Westling was doing well without significant chest discomfort.  She underwent CABG x3 in September 2016.  Last Myoview in 2019 showed inferior infarct but no ischemia.  Recent echocardiogram shows EF of 35 to 40%, hypokinesis of the basal inferoseptal, basal to mid inferior, posterior lateral, basal to mid anterolateral myocardium.  she is now pending cervical neck surgery.  Since his last visit, she well without exertional chest pain or worsening dyspnea.  She is able to accomplish more than 4 METS of activity without any issue. ? ? ?Home Medications  ?  ?Prior to Admission medications   ?Medication Sig Start Date End Date Taking? Authorizing Provider  ?acetaminophen (TYLENOL) 500 MG tablet Take 500 mg by mouth every 6 (six) hours as needed for moderate pain.    [provider]  ?amLODipine (NORVASC) 5 MG tablet Take 1 tablet by mouth once daily ?Patient taking differently: Take 5 mg by mouth at bedtime. 04/29/21   Runell GessBerry, Jonathan J, MD  ?aspirin EC 81 MG tablet Take 81 mg by mouth daily.    [provider]  ?carvedilol (COREG) 12.5 MG tablet TAKE 1 TABLET BY MOUTH TWICE DAILY WITH A MEAL 06/23/21   Runell GessBerry, Jonathan J, MD  ?Dulaglutide (TRULICITY) 0.75 MG/0.5ML SOPN Inject 0.75 mg into the skin every Wednesday.    [provider]  ?Insulin Glargine-Lixisenatide (SOLIQUA) 100-33 UNT-MCG/ML SOPN Inject 30 Units into the skin at bedtime.    [provider]  ?lisinopril (ZESTRIL) 40 MG tablet Take 1 tablet by mouth daily 10/25/19    Runell GessBerry, Jonathan J, MD  ?lovastatin (MEVACOR) 40 MG tablet Take 40 mg by mouth at bedtime.    [provider]  ?metFORMIN (GLUCOPHAGE) 500 MG tablet Take 1 tablet (500 mg total) by mouth 2 (two) times daily with a meal. Pt will need to establish care with PCP for next refills. 09/27/15   Runell GessBerry, Jonathan J, MD  ?nitroGLYCERIN (NITROSTAT) 0.4 MG SL tablet Place 1 tablet (0.4 mg total) under the tongue every 5 (five) minutes as needed for chest pain. 04/11/21   Runell GessBerry, Jonathan J, MD  ?Omega-3 Fatty Acids (FISH OIL) 1200 MG CAPS Take 1,200 mg by mouth at bedtime.    [provider]  ?pantoprazole (PROTONIX) 40 MG tablet Take 1 tablet by mouth once daily ?Patient taking differently: Take 40 mg by mouth at bedtime. 04/29/21   Runell GessBerry, Jonathan J, MD  ?Phenylephrine-APAP-guaiFENesin (TYLENOL SINUS SEVERE) 5-325-200 MG TABS Take 2 tablets by mouth daily as needed (congestion).    [provider]  ?tiZANidine (ZANAFLEX) 4 MG tablet Take 4 mg by mouth at bedtime as needed for muscle spasms. 06/02/21   [provider]  ? ? ?Physical Exam  ?  ?Vital Signs:  [name] DryKimberly L Aubry does not have vital signs  available for review today. ? ?Given telephonic nature of communication, physical exam is limited. ?AAOx3. NAD. Normal affect.  Speech and respirations are unlabored. ? ?Accessory Clinical Findings  ?  ?None ? ?Assessment & Plan  ?  ?1.  Preoperative Cardiovascular Risk Assessment: ? -Patient underwent CABG x3 in 2016, last Myoview in 2019 showed inferior wall infarct, no ischemia.  Recent echocardiogram obtained on 05/06/2021 revealed EF 35 to 40%, inferior wall hypokinesis in the same territory where she had infarct.  When compared to the previous echocardiogram in November 2019, EF is slightly down from 40 to 45% in 2019 to 35 to 40% on the recent echo.  Patient however is able to accomplish more than 4 METS of activity without any exertional chest discomfort.  She does have shortness of breath with  more strenuous activity but not with every day activity.  Degree of dyspnea on exertion has not changed recently.  Since she is stable from the anginal perspective, I think she should be cleared to proceed with su

## 2021-07-27 ENCOUNTER — Other Ambulatory Visit: Payer: Self-pay | Admitting: Cardiovascular Disease

## 2021-07-28 ENCOUNTER — Ambulatory Visit: Payer: Managed Care, Other (non HMO) | Admitting: Physician Assistant

## 2021-08-15 NOTE — Pre-Procedure Instructions (Signed)
Surgical Instructions ? ? ? Your procedure is scheduled on Tuesday 08/26/21. ? ? Report to [facility] Main Entrance "A" at 05:30 A.M., then check in with the Admitting office. ? Call this number if you have problems the morning of surgery: ? [phone] ? ? If you have any questions prior to your surgery date call [phone]: Open Monday-Friday 8am-4pm ? ? ? Remember: ? Do not eat after midnight the night before your surgery ? ?You may drink clear liquids until 04:30 A.M. the morning of your surgery.   ?Clear liquids allowed are: Water, Non-Citrus Juices (without pulp), Carbonated Beverages, Clear Tea, Black Coffee ONLY (NO MILK, CREAM OR POWDERED CREAMER of any kind), and Gatorade ?  ? Take these medicines the morning of surgery with A SIP OF WATER:  ? amLODipine (NORVASC) ? carvedilol (COREG)  ? ? Take these medicines if needed:  ? acetaminophen (TYLENOL) ? nitroGLYCERIN (NITROSTAT)  ? tiZANidine (ZANAFLEX)  ? ? ?As of today, STOP taking any Aspirin (unless otherwise instructed by your surgeon) Aleve, Naproxen, Ibuprofen, Motrin, Advil, Goody's, BC's, all herbal medications, fish oil, and all vitamins. ? ?WHAT DO I DO ABOUT MY DIABETES MEDICATION? ? ? ?Do not take oral diabetes medicines (pills) the morning of surgery. ? ?DO NOT TAKE metFORMIN (GLUCOPHAGE) the morning of surgery.  ? ?THE NIGHT BEFORE SURGERY, take 15 units of insulin glargine (LANTUS).  This is half of your normal dose.    ? ?The day of surgery, do not take other diabetes injectables, including Byetta (exenatide), Bydureon (exenatide ER), Victoza (liraglutide), or Trulicity (dulaglutide). ? ?If your CBG is greater than 220 mg/dL, you may take ? of your sliding scale (correction) dose of insulin. ? ? ?HOW TO MANAGE YOUR DIABETES ?BEFORE AND AFTER SURGERY ? ?Why is it important to control my blood sugar before and after surgery? ?Improving blood sugar levels before and after surgery helps healing and can limit problems. ?A way of improving blood  sugar control is eating a healthy diet by: ? Eating less sugar and carbohydrates ? Increasing activity/exercise ? Talking with your doctor about reaching your blood sugar goals ?High blood sugars (greater than 180 mg/dL) can raise your risk of infections and slow your recovery, so you will need to focus on controlling your diabetes during the weeks before surgery. ?Make sure that the doctor who takes care of your diabetes knows about your planned surgery including the date and location. ? ?How do I manage my blood sugar before surgery? ?Check your blood sugar at least 4 times a day, starting 2 days before surgery, to make sure that the level is not too high or low. ? ?Check your blood sugar the morning of your surgery when you wake up and every 2 hours until you get to the Short Stay unit. ? ?If your blood sugar is less than 70 mg/dL, you will need to treat for low blood sugar: ?Do not take insulin. ?Treat a low blood sugar (less than 70 mg/dL) with ? cup of clear juice (cranberry or apple), 4 glucose tablets, OR glucose gel. ?Recheck blood sugar in 15 minutes after treatment (to make sure it is greater than 70 mg/dL). If your blood sugar is not greater than 70 mg/dL on recheck, call [phone] for further instructions. ?Report your blood sugar to the short stay nurse when you get to Short Stay. ? ?If you are admitted to the hospital after surgery: ?Your blood sugar will be checked by the staff and you will probably be  given insulin after surgery (instead of oral diabetes medicines) to make sure you have good blood sugar levels. ?The goal for blood sugar control after surgery is 80-180 mg/dL.  ?         ?Do not wear jewelry or makeup ?Do not wear lotions, powders, perfumes/colognes, or deodorant. ?Do not shave 48 hours prior to surgery.  Men may shave face and neck. ?Do not bring valuables to the hospital. ?Do not wear nail polish, gel polish, artificial nails, or any other type of covering on natural nails  (fingers and toes) ?If you have artificial nails or gel coating that need to be removed by a nail salon, please have this removed prior to surgery. Artificial nails or gel coating may interfere with anesthesia's ability to adequately monitor your vital signs. ? ?[facility] is not responsible for any belongings or valuables. .  ? ?Do NOT Smoke (Tobacco/Vaping)  24 hours prior to your procedure ? ?If you use a CPAP at night, you may bring your mask for your overnight stay. ?  ?Contacts, glasses, hearing aids, dentures or partials may not be worn into surgery, please bring cases for these belongings ?  ?For patients admitted to the hospital, discharge time will be determined by your treatment team. ?  ?Patients discharged the day of surgery will not be allowed to drive home, and someone needs to stay with them for 24 hours. ? ? ?SURGICAL WAITING ROOM VISITATION ?Patients having surgery or a procedure in a hospital may have two support people. ?Children under the age of 39 must have an adult with them who is not the patient. ?They may stay in the waiting area during the procedure and may switch out with other visitors. If the patient needs to stay at the hospital during part of their recovery, the visitor guidelines for inpatient rooms apply. ? ?Please refer to the [facility] website for the visitor guidelines for Inpatients (after your surgery is over and you are in a regular room).  ? ? ? ? ? ?Special instructions:   ? ?Oral Hygiene is also important to reduce your risk of infection.  Remember - BRUSH YOUR TEETH THE MORNING OF SURGERY WITH YOUR REGULAR TOOTHPASTE ? ? ?[facility]- Preparing For Surgery ? ?Before surgery, you can play an important role. Because skin is not sterile, your skin needs to be as free of germs as possible. You can reduce the number of germs on your skin by washing with CHG (chlorahexidine gluconate) Soap before surgery.  CHG is an antiseptic cleaner which kills germs and bonds with the skin  to continue killing germs even after washing.   ? ? ?Please do not use if you have an allergy to CHG or antibacterial soaps. If your skin becomes reddened/irritated stop using the CHG.  ?Do not shave (including legs and underarms) for at least 48 hours prior to first CHG shower. It is OK to shave your face. ? ?Please follow these instructions carefully. ?  ? ? Shower the NIGHT BEFORE SURGERY and the MORNING OF SURGERY with CHG Soap.  ? If you chose to wash your hair, wash your hair first as usual with your normal shampoo. After you shampoo, rinse your hair and body thoroughly to remove the shampoo.  Then [employer] and genitals (private parts) with your normal soap and rinse thoroughly to remove soap. ? ?After that Use CHG Soap as you would any other liquid soap. You can apply CHG directly to the skin and wash gently  with a scrungie or a clean washcloth.  ? ?Apply the CHG Soap to your body ONLY FROM THE NECK DOWN.  Do not use on open wounds or open sores. Avoid contact with your eyes, ears, mouth and genitals (private parts). Wash Face and genitals (private parts)  with your normal soap.  ? ?Wash thoroughly, paying special attention to the area where your surgery will be performed. ? ?Thoroughly rinse your body with warm water from the neck down. ? ?DO NOT shower/wash with your normal soap after using and rinsing off the CHG Soap. ? ?Pat yourself dry with a CLEAN TOWEL. ? ?Wear CLEAN PAJAMAS to bed the night before surgery ? ?Place CLEAN SHEETS on your bed the night before your surgery ? ?DO NOT SLEEP WITH PETS. ? ? ?Day of Surgery: ? ?Take a shower with CHG soap. ?Wear Clean/Comfortable clothing the morning of surgery ?Do not apply any deodorants/lotions.   ?Remember to brush your teeth WITH YOUR REGULAR TOOTHPASTE. ? ? ? ?If you received a COVID test during your pre-op visit  it is requested that you wear a mask when out in public, stay away from anyone that may not be feeling well and notify your surgeon if you  develop symptoms. If you have been in contact with anyone that has tested positive in the last 10 days please notify you surgeon. ? ?  ?Please read over the following fact sheets that you were given.  ? ?

## 2021-08-18 ENCOUNTER — Encounter (HOSPITAL_COMMUNITY)
Admission: RE | Admit: 2021-08-18 | Discharge: 2021-08-18 | Disposition: A | Discharge status: Home / Self Care | Payer: Managed Care, Other (non HMO) | Source: Ambulatory Visit | Attending: Neurosurgery | Admitting: Neurosurgery

## 2021-08-18 NOTE — Progress Notes (Signed)
Pt did not show up for 0800 PAT appt until 0845. Pt instructed to reschedule with Dondra Spry. ?

## 2021-08-20 ENCOUNTER — Other Ambulatory Visit: Payer: Self-pay | Admitting: Neurosurgery

## 2021-08-20 ENCOUNTER — Other Ambulatory Visit: Payer: Self-pay

## 2021-08-20 ENCOUNTER — Encounter (HOSPITAL_COMMUNITY)
Admission: RE | Admit: 2021-08-20 | Discharge: 2021-08-20 | Disposition: A | Discharge status: Home / Self Care | Payer: Managed Care, Other (non HMO) | Source: Ambulatory Visit | Attending: Neurosurgery | Admitting: Neurosurgery

## 2021-08-20 ENCOUNTER — Encounter (HOSPITAL_COMMUNITY): Payer: Self-pay

## 2021-08-20 VITALS — BP 140/86 | HR 85 | Temp 98.3°F | Resp 18 | Ht 63.0 in | Wt 180.8 lb

## 2021-08-20 DIAGNOSIS — I6523 Occlusion and stenosis of bilateral carotid arteries: Secondary | ICD-10-CM | POA: Diagnosis not present

## 2021-08-20 DIAGNOSIS — Z90710 Acquired absence of both cervix and uterus: Secondary | ICD-10-CM | POA: Diagnosis not present

## 2021-08-20 DIAGNOSIS — I252 Old myocardial infarction: Secondary | ICD-10-CM | POA: Insufficient documentation

## 2021-08-20 DIAGNOSIS — Z7982 Long term (current) use of aspirin: Secondary | ICD-10-CM | POA: Diagnosis not present

## 2021-08-20 DIAGNOSIS — Z01818 Encounter for other preprocedural examination: Secondary | ICD-10-CM | POA: Insufficient documentation

## 2021-08-20 DIAGNOSIS — Z951 Presence of aortocoronary bypass graft: Secondary | ICD-10-CM | POA: Insufficient documentation

## 2021-08-20 DIAGNOSIS — M50223 Other cervical disc displacement at C6-C7 level: Secondary | ICD-10-CM | POA: Insufficient documentation

## 2021-08-20 DIAGNOSIS — Z955 Presence of coronary angioplasty implant and graft: Secondary | ICD-10-CM | POA: Diagnosis not present

## 2021-08-20 DIAGNOSIS — R9431 Abnormal electrocardiogram [ECG] [EKG]: Secondary | ICD-10-CM | POA: Insufficient documentation

## 2021-08-20 DIAGNOSIS — E785 Hyperlipidemia, unspecified: Secondary | ICD-10-CM | POA: Diagnosis not present

## 2021-08-20 DIAGNOSIS — I10 Essential (primary) hypertension: Secondary | ICD-10-CM | POA: Diagnosis not present

## 2021-08-20 DIAGNOSIS — I2510 Atherosclerotic heart disease of native coronary artery without angina pectoris: Secondary | ICD-10-CM | POA: Diagnosis not present

## 2021-08-20 DIAGNOSIS — M4802 Spinal stenosis, cervical region: Secondary | ICD-10-CM | POA: Diagnosis not present

## 2021-08-20 DIAGNOSIS — E119 Type 2 diabetes mellitus without complications: Secondary | ICD-10-CM | POA: Diagnosis not present

## 2021-08-20 DIAGNOSIS — R0602 Shortness of breath: Secondary | ICD-10-CM | POA: Insufficient documentation

## 2021-08-20 LAB — BASIC METABOLIC PANEL
Anion gap: 10 (ref 5–15)
BUN: 20 mg/dL (ref 6–20)
CO2: 23 mmol/L (ref 22–32)
Calcium: 9.7 mg/dL (ref 8.9–10.3)
Chloride: 104 mmol/L (ref 98–111)
Creatinine, Ser: 0.95 mg/dL (ref 0.44–1.00)
GFR, Estimated: >60 mL/min (ref >60)
Glucose, Bld: 134 mg/dL — ABNORMAL HIGH (ref 70–99)
Potassium: 4 mmol/L (ref 3.5–5.1)
Sodium: 137 mmol/L (ref 135–145)

## 2021-08-20 LAB — CBC
HCT: 39 % (ref 36.0–46.0)
Hemoglobin: 12.5 g/dL (ref 12.0–15.0)
MCH: 29.8 pg (ref 26.0–34.0)
MCHC: 32.1 g/dL (ref 30.0–36.0)
MCV: 92.9 fL (ref 80.0–100.0)
Platelets: 200 10*3/uL (ref 150–400)
RBC: 4.2 MIL/uL (ref 3.87–5.11)
RDW: 13.9 % (ref 11.5–15.5)
WBC: 7.4 10*3/uL (ref 4.0–10.5)
nRBC: 0 % (ref 0.0–0.2)

## 2021-08-20 LAB — SURGICAL PCR SCREEN
MRSA, PCR: NEGATIVE
Staphylococcus aureus: NEGATIVE

## 2021-08-20 LAB — GLUCOSE, CAPILLARY: Glucose-Capillary: 184 mg/dL — ABNORMAL HIGH (ref 70–99)

## 2021-08-20 LAB — HEMOGLOBIN A1C
Hgb A1c MFr Bld: 7 % — ABNORMAL HIGH (ref 4.8–5.6)
Mean Plasma Glucose: 154.2 mg/dL

## 2021-08-20 NOTE — Progress Notes (Signed)
Spoke with Nickie in Dr. Val Riles office and requested pre op orders for this patient. She said she would get them in.  ? ?

## 2021-08-20 NOTE — Progress Notes (Signed)
PCP - Fleet Contras MD ?Cardiologist - [name] MS ? ?PPM/ICD - denies ?Device Orders -  ?Rep Notified -  ? ?Chest x-ray - na ?EKG - 08/20/21 ?Stress Test - 03/08/18 ?ECHO - 05/06/21 ?Cardiac Cath - 02/01/15 ? ?Sleep Study - none ?CPAP -  ? ?Fasting Blood Sugar - 120's ?Checks Blood Sugar once  a day ? ?Blood Thinner Instructions:na ?Aspirin Instructions:Pt' states her last dose of aspirin was 08/18/21 per surgeon's instructions.  ? ?ERAS Protcol - clear liquids until 0430 ?PRE-SURGERY Ensure or G2- no  ? ?COVID TEST- na ? ? ?Anesthesia review: yes for cardiac history ? ?Patient denies shortness of breath, fever, cough and chest pain at PAT appointment ? ? ?All instructions explained to the patient, with a verbal understanding of the material. Patient agrees to go over the instructions while at home for a better understanding. Patient also instructed to wear a mask when out in public prior to surgery.. The opportunity to ask questions was provided. ?  ?

## 2021-08-21 NOTE — Anesthesia Preprocedure Evaluation (Addendum)
Anesthesia Evaluation  ?Patient identified by MRN, date of birth, ID band ?Patient awake ? ? ? ?Reviewed: ?Allergy & Precautions, NPO status , Patient's Chart, lab work & pertinent test results ? ?Airway ?Mallampati: II ? ?TM Distance: >3 FB ? ? ? ? Dental ?  ?Pulmonary ?neg pulmonary ROS, former smoker,  ?  ?breath sounds clear to auscultation ? ? ? ? ? ? Cardiovascular ?hypertension, + angina + CAD and + Past MI  ? ?Rhythm:Regular Rate:Normal ? ? ?  ?Neuro/Psych ?  ? GI/Hepatic ?negative GI ROS, Neg liver ROS,   ?Endo/Other  ?diabetes ? Renal/GU ?  ? ?  ?Musculoskeletal ? ? Abdominal ?  ?Peds ? Hematology ?  ?Anesthesia Other Findings ? ? Reproductive/Obstetrics ? ?  ? ? ? ? ? ? ? ? ? ? ? ? ? ?  ?  ? ? ? ? ? ? ? ?Anesthesia Physical ?Anesthesia Plan ? ?ASA: 3 ? ?Anesthesia Plan: General  ? ?Post-op Pain Management:   ? ?Induction: Intravenous ? ?PONV Risk Score and Plan: 3 and Ondansetron, Dexamethasone and Midazolam ? ?Airway Management Planned: Oral ETT ? ?Additional Equipment:  ? ?Intra-op Plan:  ? ?Post-operative Plan: Possible Post-op intubation/ventilation ? ?Informed Consent: I have reviewed the patients History and Physical, chart, labs and discussed the procedure including the risks, benefits and alternatives for the proposed anesthesia with the patient or authorized representative who has indicated his/her understanding and acceptance.  ? ? ? ?Dental advisory given ? ?Plan Discussed with: CRNA and Anesthesiologist ? ?Anesthesia Plan Comments: (PAT note written 08/21/2021 by Shonna Chock, PA-C. ?)  ? ? ? ? ? ?Anesthesia Quick Evaluation ? ?

## 2021-08-21 NOTE — Progress Notes (Signed)
Anesthesia Chart Review: ? Case: [id number] Date/Time: 08/26/21 0715  ? Procedure: ACDF C6-7 - 3C  ? Anesthesia type: General  ? Pre-op diagnosis: HERNIATED NUCLEUS PULPSUS, CERVICAL  ? Location: [facility] ROOM 20 / [facility] OR  ? Surgeons: Lisbeth RenshawNundkumar, Neelesh, MD  ? ?  ? ? ?DISCUSSION: Patient is a 51 year old female scheduled for the above procedure. ? ?History includes former smoker (quit 01/10/15), HTN, DM2, CAD (NSTEMI, s/p DES pCX, PTCA mCX, PTCA PDA, DES mRCA 05/29/14; DES pRCA 09/07/14; CABG: LIMA-LAD, SVG-PDA, SVG-OM1 02/07/15; post-CABG course complicated by PEA/VT/cardiogenic shock requiring resuscitation, intubation and IABP), ischemic cardiomyopathy, HLD, carotid artery stenosis (1-39% BICA 01/2018), hysterectomy (03/04/05). ? ?She had virtual preoperative cardiology assessment on 07/22/21 by Azalee CourseMeng, Hao, PA. ?" Preoperative Cardiovascular Risk Assessment: ?            -Patient underwent CABG x3 in 2016, last Myoview in 2019 showed inferior wall infarct, no ischemia.  Recent echocardiogram obtained on 05/06/2021 revealed EF 35 to 40%, inferior wall hypokinesis in the same territory where she had infarct.  When compared to the previous echocardiogram in November 2019, EF is slightly down from 40 to 45% in 2019 to 35 to 40% on the recent echo.  Patient however is able to accomplish more than 4 METS of activity without any exertional chest discomfort.  She does have shortness of breath with more strenuous activity but not with every day activity.  Degree of dyspnea on exertion has not changed recently.  Since she is stable from the anginal perspective, I think she should be cleared to proceed with surgery without further work-up. ?            -According to this patient, her surgeon instructed her to hold aspirin for 7 days prior to her surgery.  She is also on fish oil which need to be held for 7 days as well. I discussed with Dr. Allyson SabalBerry who agrees the patient may hold ASA and fish oil for 7 days prior to the surgery and restart  as soon as possible afterward."  ?  ?Reported last dose of aspirin 08/18/21. ? ?Anesthesia team to evaluate on the day of surgery. ? ? ?VS: BP 140/86   Pulse 85   Temp 36.8 ?C (Oral)   Resp 18   Ht 5\' 3"  (1.6 m)   Wt 82 kg   SpO2 100%   BMI 32.03 kg/m?  ? ? ?PROVIDERS: ?Fleet ContrasAvbuere, Edwin, MD is PCP  ?Nanetta BattyBerry, Jonathan, MD is cardiologist ? ? ?LABS: Labs reviewed: Acceptable for surgery. ?(all labs ordered are listed, but only abnormal results are displayed) ? ?Labs Reviewed  ?GLUCOSE, CAPILLARY - Abnormal; Notable for the following components:  ?    Result Value  ? Glucose-Capillary 184 (*)   ? All other components within normal limits  ?BASIC METABOLIC PANEL - Abnormal; Notable for the following components:  ? Glucose, Bld 134 (*)   ? All other components within normal limits  ?HEMOGLOBIN A1C - Abnormal; Notable for the following components:  ? Hgb A1c MFr Bld 7.0 (*)   ? All other components within normal limits  ?SURGICAL PCR SCREEN  ?CBC  ? ? ?PFTs > 5 years ago. ? ? ?IMAGES: ?MRI C-spine 06/15/21 ([facility]opy/PACS; ordered by Pati GalloJames Kramer): ?IMPRESSION: ?1. Large left paracentral disc extrusion at C6-7 with severe ?left-sided spinal stenosis and left C8 nerve root impingement. ?2. Moderate spinal stenosis at C5-6. ?ADDENDUM: ?Mild T2 hyperintensities in the pons, nonspecific but may reflect ?chronic small vessel  ischemia. Correlate for vascular risk factors. ?If there is clinical concern for an alternative etiology such as a ?demyelinating disease or an infectious/inflammatory process, ?consider a brain MRI for further evaluation.  ? ? ?EKG: 08/20/21: ?Normal sinus rhythm ?Left axis deviation ?Left ventricular hypertrophy with repolarization abnormality ( R in aVL , [employer] product ) ?Abnormal ECG ?When compared with ECG of 08-Feb-2015 06:49, ?Left ventricular hypertrophy with repolarization abnormality is now Present ?Confirmed by Yvonne Kendall [phone]) on 08/20/2021 7:39:54 PM ? ? ?CV: ?Echo 05/06/21: ?IMPRESSIONS   ? 1. Hypokinesis in the basal inferoseptal, basal to mid inferior,  ?posterolateral,and basal to mid anterolateral myocardium. Left ventricular  ?ejection fraction, by estimation, is 35 to 40%. The left ventricle has  ?moderately decreased function. The left  ?ventricle demonstrates regional wall motion abnormalities (see scoring  ?diagram/findings for description). Left ventricular diastolic parameters  ?are consistent with Grade II diastolic dysfunction (pseudonormalization).  ?Elevated left ventricular  ?end-diastolic pressure.  ? 2. Right ventricular systolic function is normal. The right ventricular  ?size is normal. There is normal pulmonary artery systolic pressure.  ? 3. The mitral valve is normal in structure. Trivial mitral valve  ?regurgitation. No evidence of mitral stenosis.  ? 4. The aortic valve is tricuspid. Aortic valve regurgitation is not  ?visualized. No aortic stenosis is present.  ? 5. The inferior vena cava is normal in size with greater than 50%  ?respiratory variability, suggesting right atrial pressure of 3 mmHg.  ?- Comparison(s): Compared with the echo 03/2018, systolic function is  ?slightly worse. [LVEF 40-45% 03/29/18] ? ? ?Nuclear stress test 03/08/18: ?The left ventricular ejection fraction is moderately decreased (30-44%). ?Nuclear stress EF: 31%. There is inferoseptal and inferolateral wall akinesis. ?There was no ST segment deviation noted during stress. ?Defect 1: There is a large defect of severe severity present in the basal inferior, basal inferolateral, mid inferior, mid inferolateral and apical inferior location. This represents old infarct pattern. ?This is a high risk study. ?Findings consistent with prior myocardial infarction. There is no significant ischemia identified. ? ? ?US Carotid 01/28/18: ?Final Interpretation:  ?- Right Carotid: Velocities in the right ICA are consistent with a 1-39%  ?stenosis.  ?- Left Carotid: Velocities in the left ICA are consistent with a  1-39%  ?stenosis.  ?- Vertebrals:  Bilateral vertebral arteries demonstrate antegrade flow.  ?Subclavians: Normal flow hemodynamics were seen in bilateral subclavian  ?             arteries.  ? ? ?Cardiac event monitor 05/17/15: ?Sinus rhythm ? ?Last LHC was 02/01/15 pre-CABG. ? ? ?Past Medical History:  ?Diagnosis Date  ? CAD (coronary artery disease)   ? cath 09/07/2014 DES to 95% prox RCA, residual 70-80% origin of AV groove, 90% ostial PDA stenosis; 01/2015, NSTEMI w/ CABG, LIMA-LAD, SVG-OM1, SVG-PCA  ? Carotid artery disease (HCC)   ? a. 40-59% BICA by duplex 01/2015.  ? Diabetes mellitus without complication (HCC)   ? H/O medication noncompliance   ? Hyperlipidemia   ? Hypertension   ? MI (myocardial infarction) (HCC)   ? Tobacco abuse   ? ? ?Past Surgical History:  ?Procedure Laterality Date  ? ABDOMINAL HYSTERECTOMY    ? CARDIAC CATHETERIZATION N/A 02/01/2015  ? Procedure: Left Heart Cath and Coronary Angiography;  Surgeon: Runell Gess, MD;  Location: [facility] INVASIVE CV LAB;  Service: Cardiovascular;  Laterality: N/A;  ? CARDIAC CATHETERIZATION N/A 02/07/2015  ? Procedure: IABP Insertion;  Surgeon: Dolores Patty, MD;  Location: [facility]  INVASIVE CV LAB;  Service: Cardiovascular;  Laterality: N/A;  ? CORONARY ARTERY BYPASS GRAFT N/A 02/07/2015  ? Procedure: CORONARY ARTERY BYPASS GRAFTING (CABG);  Surgeon: Kerin Perna, MD; LIMA-LAD, SVT-OM1, SVG-PDA  ? LEFT HEART CATHETERIZATION WITH CORONARY ANGIOGRAM N/A 05/29/2014  ? Procedure: LEFT HEART CATHETERIZATION WITH CORONARY ANGIOGRAM;  Surgeon: Corky Crafts, MD;  Location: [facility] CATH LAB;  Service: Cardiovascular;  Laterality: N/A;  ? LEFT HEART CATHETERIZATION WITH CORONARY ANGIOGRAM N/A 09/07/2014  ? Procedure: LEFT HEART CATHETERIZATION WITH CORONARY ANGIOGRAM;  Surgeon: Marykay Lex, MD;  Location: [facility] CATH LAB;  Service: Cardiovascular;  Laterality: N/A;  ? PERCUTANEOUS CORONARY STENT INTERVENTION (PCI-S)  05/29/2014  ? Procedure: PERCUTANEOUS CORONARY STENT  INTERVENTION (PCI-S);  Surgeon: Corky Crafts, MD;  Location: [facility] CATH LAB;  Service: Cardiovascular;;  ? TEE WITHOUT CARDIOVERSION N/A 02/07/2015  ? Procedure: TRANSESOPHAGEAL ECHOCARDIOGRAM (TEE);  Sur

## 2021-08-26 ENCOUNTER — Ambulatory Visit (HOSPITAL_BASED_OUTPATIENT_CLINIC_OR_DEPARTMENT_OTHER): Payer: Commercial Managed Care - HMO | Admitting: Certified Registered Nurse Anesthetist

## 2021-08-26 ENCOUNTER — Ambulatory Visit (HOSPITAL_COMMUNITY): Payer: Commercial Managed Care - HMO

## 2021-08-26 ENCOUNTER — Encounter (HOSPITAL_COMMUNITY)
Admission: RE | Disposition: A | Discharge status: Home / Self Care | Payer: Self-pay | Source: Home / Self Care | Attending: Neurosurgery

## 2021-08-26 ENCOUNTER — Encounter (HOSPITAL_COMMUNITY): Payer: Self-pay | Admitting: Neurosurgery

## 2021-08-26 ENCOUNTER — Ambulatory Visit (HOSPITAL_COMMUNITY): Payer: Commercial Managed Care - HMO | Admitting: Vascular Surgery

## 2021-08-26 ENCOUNTER — Observation Stay (HOSPITAL_COMMUNITY)
Admission: RE | Admit: 2021-08-26 | Discharge: 2021-08-26 | Disposition: A | Discharge status: Home / Self Care | Payer: Commercial Managed Care - HMO | Attending: Neurosurgery | Admitting: Neurosurgery

## 2021-08-26 ENCOUNTER — Other Ambulatory Visit: Payer: Self-pay

## 2021-08-26 DIAGNOSIS — Z7982 Long term (current) use of aspirin: Secondary | ICD-10-CM | POA: Diagnosis not present

## 2021-08-26 DIAGNOSIS — Z955 Presence of coronary angioplasty implant and graft: Secondary | ICD-10-CM | POA: Diagnosis not present

## 2021-08-26 DIAGNOSIS — E119 Type 2 diabetes mellitus without complications: Secondary | ICD-10-CM | POA: Insufficient documentation

## 2021-08-26 DIAGNOSIS — M50023 Cervical disc disorder at C6-C7 level with myelopathy: Secondary | ICD-10-CM | POA: Diagnosis present

## 2021-08-26 DIAGNOSIS — Z794 Long term (current) use of insulin: Secondary | ICD-10-CM | POA: Diagnosis not present

## 2021-08-26 DIAGNOSIS — Z87891 Personal history of nicotine dependence: Secondary | ICD-10-CM | POA: Diagnosis not present

## 2021-08-26 DIAGNOSIS — I25119 Atherosclerotic heart disease of native coronary artery with unspecified angina pectoris: Secondary | ICD-10-CM | POA: Diagnosis not present

## 2021-08-26 DIAGNOSIS — M50223 Other cervical disc displacement at C6-C7 level: Secondary | ICD-10-CM

## 2021-08-26 DIAGNOSIS — M5000 Cervical disc disorder with myelopathy, unspecified cervical region: Secondary | ICD-10-CM | POA: Diagnosis present

## 2021-08-26 DIAGNOSIS — Z79899 Other long term (current) drug therapy: Secondary | ICD-10-CM | POA: Insufficient documentation

## 2021-08-26 DIAGNOSIS — I2510 Atherosclerotic heart disease of native coronary artery without angina pectoris: Secondary | ICD-10-CM | POA: Diagnosis not present

## 2021-08-26 DIAGNOSIS — I252 Old myocardial infarction: Secondary | ICD-10-CM

## 2021-08-26 DIAGNOSIS — Z7984 Long term (current) use of oral hypoglycemic drugs: Secondary | ICD-10-CM | POA: Insufficient documentation

## 2021-08-26 DIAGNOSIS — I10 Essential (primary) hypertension: Secondary | ICD-10-CM

## 2021-08-26 DIAGNOSIS — Z951 Presence of aortocoronary bypass graft: Secondary | ICD-10-CM | POA: Diagnosis not present

## 2021-08-26 HISTORY — PX: ANTERIOR CERVICAL DECOMP/DISCECTOMY FUSION: SHX1161

## 2021-08-26 LAB — GLUCOSE, CAPILLARY
Glucose-Capillary: 90 mg/dL (ref 70–99)
Glucose-Capillary: 132 mg/dL — ABNORMAL HIGH (ref 70–99)

## 2021-08-26 LAB — TYPE AND SCREEN
ABO/RH(D): O POS
Antibody Screen: NEGATIVE

## 2021-08-26 SURGERY — ANTERIOR CERVICAL DECOMPRESSION/DISCECTOMY FUSION 1 LEVEL
Anesthesia: General

## 2021-08-26 MED ORDER — CEFAZOLIN SODIUM-DEXTROSE 2-4 GM/100ML-% IV SOLN
2.0000 g | INTRAVENOUS | Status: AC
  Administered 2021-08-26: 2 g via INTRAVENOUS
  Filled 2021-08-26: qty 100

## 2021-08-26 MED ORDER — CHLORHEXIDINE GLUCONATE CLOTH 2 % EX PADS: 6.0000 | MEDICATED_PAD | Freq: Once | CUTANEOUS | Status: DC

## 2021-08-26 MED ORDER — FENTANYL CITRATE (PF) 100 MCG/2ML IJ SOLN: 25.0000 ug | INTRAMUSCULAR | Status: DC | PRN

## 2021-08-26 MED ORDER — BUPIVACAINE HCL (PF) 0.5 % IJ SOLN
INTRAMUSCULAR | Status: AC
  Filled 2021-08-26: qty 30

## 2021-08-26 MED ORDER — LACTATED RINGERS IV SOLN: INTRAVENOUS | Status: DC

## 2021-08-26 MED ORDER — PROPOFOL 10 MG/ML IV BOLUS
INTRAVENOUS | Status: DC | PRN
Start: 2021-08-26 — End: 2021-08-26
  Administered 2021-08-26: 140 mg via INTRAVENOUS

## 2021-08-26 MED ORDER — PHENYLEPHRINE 80 MCG/ML (10ML) SYRINGE FOR IV PUSH (FOR BLOOD PRESSURE SUPPORT)
PREFILLED_SYRINGE | INTRAVENOUS | Status: DC | PRN
  Administered 2021-08-26 (×4): 80 ug via INTRAVENOUS

## 2021-08-26 MED ORDER — ROCURONIUM BROMIDE 10 MG/ML (PF) SYRINGE
PREFILLED_SYRINGE | INTRAVENOUS | Status: AC
  Filled 2021-08-26: qty 10

## 2021-08-26 MED ORDER — LIDOCAINE 2% (20 MG/ML) 5 ML SYRINGE
INTRAMUSCULAR | Status: AC
  Filled 2021-08-26: qty 5

## 2021-08-26 MED ORDER — HEMOSTATIC AGENTS (NO CHARGE) OPTIME
TOPICAL | Status: DC | PRN
  Administered 2021-08-26: 1 via TOPICAL

## 2021-08-26 MED ORDER — PHENYLEPHRINE 80 MCG/ML (10ML) SYRINGE FOR IV PUSH (FOR BLOOD PRESSURE SUPPORT)
PREFILLED_SYRINGE | INTRAVENOUS | Status: AC
  Filled 2021-08-26: qty 10

## 2021-08-26 MED ORDER — FENTANYL CITRATE (PF) 100 MCG/2ML IJ SOLN
INTRAMUSCULAR | Status: DC | PRN
  Administered 2021-08-26 (×5): 50 ug via INTRAVENOUS

## 2021-08-26 MED ORDER — OXYCODONE-ACETAMINOPHEN 5-325 MG PO TABS: 1.0000 | ORAL_TABLET | ORAL | 0 refills | Status: AC | PRN

## 2021-08-26 MED ORDER — LIDOCAINE 2% (20 MG/ML) 5 ML SYRINGE
INTRAMUSCULAR | Status: DC | PRN
  Administered 2021-08-26: 80 mg via INTRAVENOUS

## 2021-08-26 MED ORDER — INSULIN ASPART 100 UNIT/ML IJ SOLN: 0.0000 [IU] | INTRAMUSCULAR | Status: DC | PRN

## 2021-08-26 MED ORDER — 0.9 % SODIUM CHLORIDE (POUR BTL) OPTIME
TOPICAL | Status: DC | PRN
  Administered 2021-08-26: 1000 mL

## 2021-08-26 MED ORDER — ROCURONIUM BROMIDE 10 MG/ML (PF) SYRINGE
PREFILLED_SYRINGE | INTRAVENOUS | Status: DC | PRN
  Administered 2021-08-26: 50 mg via INTRAVENOUS
  Administered 2021-08-26: 20 mg via INTRAVENOUS

## 2021-08-26 MED ORDER — CHLORHEXIDINE GLUCONATE 0.12 % MT SOLN
15.0000 mL | Freq: Once | OROMUCOSAL | Status: AC
  Administered 2021-08-26: 15 mL via OROMUCOSAL
  Filled 2021-08-26: qty 15

## 2021-08-26 MED ORDER — SUGAMMADEX SODIUM 200 MG/2ML IV SOLN
INTRAVENOUS | Status: DC | PRN
Start: 2021-08-26 — End: 2021-08-26
  Administered 2021-08-26: 200 mg via INTRAVENOUS

## 2021-08-26 MED ORDER — ORAL CARE MOUTH RINSE: 15.0000 mL | Freq: Once | OROMUCOSAL | Status: AC

## 2021-08-26 MED ORDER — FENTANYL CITRATE (PF) 250 MCG/5ML IJ SOLN
INTRAMUSCULAR | Status: AC
  Filled 2021-08-26: qty 5

## 2021-08-26 MED ORDER — LIDOCAINE-EPINEPHRINE 1 %-1:100000 IJ SOLN
INTRAMUSCULAR | Status: DC | PRN
Start: 2021-08-26 — End: 2021-08-26
  Administered 2021-08-26: 3.5 mL

## 2021-08-26 MED ORDER — MIDAZOLAM HCL 2 MG/2ML IJ SOLN
INTRAMUSCULAR | Status: DC | PRN
  Administered 2021-08-26: 2 mg via INTRAVENOUS

## 2021-08-26 MED ORDER — THROMBIN 5000 UNITS EX SOLR
CUTANEOUS | Status: AC
  Filled 2021-08-26: qty 15000

## 2021-08-26 MED ORDER — MIDAZOLAM HCL 2 MG/2ML IJ SOLN
INTRAMUSCULAR | Status: AC
  Filled 2021-08-26: qty 2

## 2021-08-26 MED ORDER — GELATIN ABSORBABLE MT POWD
OROMUCOSAL | Status: DC | PRN
  Administered 2021-08-26: 5 mL via TOPICAL

## 2021-08-26 MED ORDER — ONDANSETRON HCL 4 MG/2ML IJ SOLN
INTRAMUSCULAR | Status: DC | PRN
Start: 2021-08-26 — End: 2021-08-26
  Administered 2021-08-26: 4 mg via INTRAVENOUS

## 2021-08-26 MED ORDER — DEXAMETHASONE SODIUM PHOSPHATE 10 MG/ML IJ SOLN
INTRAMUSCULAR | Status: DC | PRN
  Administered 2021-08-26: 4 mg via INTRAVENOUS

## 2021-08-26 MED ORDER — ONDANSETRON HCL 4 MG/2ML IJ SOLN
INTRAMUSCULAR | Status: AC
  Filled 2021-08-26: qty 2

## 2021-08-26 MED ORDER — METHOCARBAMOL 1000 MG/10ML IJ SOLN: 500.0000 mg | Freq: Four times a day (QID) | INTRAVENOUS | Status: DC | PRN

## 2021-08-26 MED ORDER — BUPIVACAINE HCL 0.5 % IJ SOLN
INTRAMUSCULAR | Status: DC | PRN
  Administered 2021-08-26: 3.5 mL

## 2021-08-26 MED ORDER — PHENYLEPHRINE HCL-NACL 20-0.9 MG/250ML-% IV SOLN
INTRAVENOUS | Status: DC | PRN
  Administered 2021-08-26: 40 ug/min via INTRAVENOUS

## 2021-08-26 MED ORDER — DEXAMETHASONE SODIUM PHOSPHATE 10 MG/ML IJ SOLN
INTRAMUSCULAR | Status: AC
  Filled 2021-08-26: qty 1

## 2021-08-26 MED ORDER — LIDOCAINE-EPINEPHRINE 1 %-1:100000 IJ SOLN
INTRAMUSCULAR | Status: AC
  Filled 2021-08-26: qty 1

## 2021-08-26 MED ORDER — ASPIRIN EC 81 MG PO TBEC: 81.0000 mg | DELAYED_RELEASE_TABLET | Freq: Every day | ORAL | 11 refills | Status: AC

## 2021-08-26 MED ORDER — METHOCARBAMOL 500 MG PO TABS: 500.0000 mg | ORAL_TABLET | Freq: Four times a day (QID) | ORAL | Status: DC | PRN

## 2021-08-26 MED ORDER — THROMBIN 5000 UNITS EX SOLR
CUTANEOUS | Status: DC | PRN
  Administered 2021-08-26: 10000 [IU] via TOPICAL

## 2021-08-26 SURGICAL SUPPLY — 67 items
ADH SKN CLS APL DERMABOND .7 (GAUZE/BANDAGES/DRESSINGS) ×1
ADH SKN CLS LQ APL DERMABOND (GAUZE/BANDAGES/DRESSINGS) ×1
APL SKNCLS STERI-STRIP NONHPOA (GAUZE/BANDAGES/DRESSINGS)
BAG COUNTER SPONGE SURGICOUNT (BAG) ×4 IMPLANT
BAG SPNG CNTER NS LX DISP (BAG) ×2
BAND INSRT 18 STRL LF DISP RB (MISCELLANEOUS) ×2
BAND RUBBER #18 3X1/16 STRL (MISCELLANEOUS) ×6 IMPLANT
BENZOIN TINCTURE PRP APPL 2/3 (GAUZE/BANDAGES/DRESSINGS) IMPLANT
BLADE CLIPPER SURG (BLADE) IMPLANT
BLADE SURG 11 STRL SS (BLADE) ×3 IMPLANT
BLADE ULTRA TIP 2M (BLADE) IMPLANT
BUR MATCHSTICK NEURO 3.0 LAGG (BURR) ×3 IMPLANT
CANISTER SUCT 3000ML PPV (MISCELLANEOUS) ×3 IMPLANT
CARTRIDGE OIL MAESTRO DRILL (MISCELLANEOUS) ×2 IMPLANT
DECANTER SPIKE VIAL GLASS SM (MISCELLANEOUS) ×2 IMPLANT
DERMABOND ADHESIVE PROPEN (GAUZE/BANDAGES/DRESSINGS) ×1
DERMABOND ADVANCED (GAUZE/BANDAGES/DRESSINGS) ×1
DERMABOND ADVANCED .7 DNX12 (GAUZE/BANDAGES/DRESSINGS) ×2 IMPLANT
DERMABOND ADVANCED .7 DNX6 (GAUZE/BANDAGES/DRESSINGS) IMPLANT
DIFFUSER DRILL AIR PNEUMATIC (MISCELLANEOUS) ×3 IMPLANT
DRAPE C-ARM 42X72 X-RAY (DRAPES) ×6 IMPLANT
DRAPE HALF SHEET 40X57 (DRAPES) IMPLANT
DRAPE LAPAROTOMY 100X72 PEDS (DRAPES) ×3 IMPLANT
DRAPE MICROSCOPE LEICA (MISCELLANEOUS) ×3 IMPLANT
DRSG OPSITE 4X5.5 SM (GAUZE/BANDAGES/DRESSINGS) ×6 IMPLANT
DRSG OPSITE POSTOP 3X4 (GAUZE/BANDAGES/DRESSINGS) ×1 IMPLANT
DURAPREP 6ML APPLICATOR 50/CS (WOUND CARE) ×3 IMPLANT
ELECT COATED BLADE 2.86 ST (ELECTRODE) ×3 IMPLANT
ELECT REM PT RETURN 9FT ADLT (ELECTROSURGICAL) ×2
ELECTRODE REM PT RTRN 9FT ADLT (ELECTROSURGICAL) ×2 IMPLANT
GAUZE 4X4 16PLY ~~LOC~~+RFID DBL (SPONGE) IMPLANT
GLOVE BIO SURGEON STRL SZ7.5 (GLOVE) ×1 IMPLANT
GLOVE BIOGEL PI IND STRL 7.5 (GLOVE) ×4 IMPLANT
GLOVE BIOGEL PI INDICATOR 7.5 (GLOVE) ×3
GLOVE ECLIPSE 6.5 STRL STRAW (GLOVE) ×1 IMPLANT
GLOVE ECLIPSE 7.0 STRL STRAW (GLOVE) ×3 IMPLANT
GLOVE EXAM NITRILE XL STR (GLOVE) IMPLANT
GOWN STRL REUS W/ TWL LRG LVL3 (GOWN DISPOSABLE) ×4 IMPLANT
GOWN STRL REUS W/ TWL XL LVL3 (GOWN DISPOSABLE) IMPLANT
GOWN STRL REUS W/TWL 2XL LVL3 (GOWN DISPOSABLE) IMPLANT
GOWN STRL REUS W/TWL LRG LVL3 (GOWN DISPOSABLE) ×4
GOWN STRL REUS W/TWL XL LVL3 (GOWN DISPOSABLE) ×2
HEMOSTAT POWDER KIT SURGIFOAM (HEMOSTASIS) ×3 IMPLANT
KIT BASIN OR (CUSTOM PROCEDURE TRAY) ×3 IMPLANT
KIT TURNOVER KIT B (KITS) ×3 IMPLANT
NDL SPNL 22GX3.5 QUINCKE BK (NEEDLE) ×2 IMPLANT
NEEDLE HYPO 22GX1.5 SAFETY (NEEDLE) ×3 IMPLANT
NEEDLE SPNL 22GX3.5 QUINCKE BK (NEEDLE) ×2 IMPLANT
NS IRRIG 1000ML POUR BTL (IV SOLUTION) ×3 IMPLANT
OIL CARTRIDGE MAESTRO DRILL (MISCELLANEOUS) ×2
PACK LAMINECTOMY NEURO (CUSTOM PROCEDURE TRAY) ×3 IMPLANT
PAD ARMBOARD 7.5X6 YLW CONV (MISCELLANEOUS) ×9 IMPLANT
PLATE ZEVO 1LVL 19MM (Plate) ×1 IMPLANT
PUTTY DBX 1CC (Putty) ×2 IMPLANT
PUTTY DBX 1CC DEPUY (Putty) IMPLANT
SCREW 3.5 SELFDRILL 15MM VARI (Screw) ×4 IMPLANT
SPACER ANT CERV NL 16X18X6 (Spacer) ×1 IMPLANT
SPONGE INTESTINAL PEANUT (DISPOSABLE) ×3 IMPLANT
SPONGE SURGIFOAM ABS GEL SZ50 (HEMOSTASIS) ×3 IMPLANT
STAPLER VISISTAT 35W (STAPLE) ×3 IMPLANT
STRIP CLOSURE SKIN 1/2X4 (GAUZE/BANDAGES/DRESSINGS) IMPLANT
SUT VIC AB 3-0 SH 8-18 (SUTURE) ×3 IMPLANT
SUT VICRYL 3-0 RB1 18 ABS (SUTURE) ×6 IMPLANT
TAPE CLOTH 3X10 TAN LF (GAUZE/BANDAGES/DRESSINGS) ×3 IMPLANT
TOWEL GREEN STERILE (TOWEL DISPOSABLE) ×3 IMPLANT
TOWEL GREEN STERILE FF (TOWEL DISPOSABLE) ×3 IMPLANT
WATER STERILE IRR 1000ML POUR (IV SOLUTION) ×3 IMPLANT

## 2021-08-26 NOTE — Op Note (Signed)
?NEUROSURGERY OPERATIVE NOTE  ? ?PREOP DIAGNOSIS: Cervical HNP C6-7 ? ?POSTOP DIAGNOSIS: Same ? ?PROCEDURE: ?1. Discectomy at C6-7 for decompression of spinal cord and exiting nerve roots  ?2. Placement of intervertebral biomechanical device Medtronic Titan 71mm wide lordotic cage ?3. Placement of anterior instrumentation consisting of interbody plate and screws - Medtronic Zevo 8mm plate, 81JS screws  ?4. Use of morselized bone allograft  ?5. Arthrodesis C6-7, anterior interbody technique  ?6. Use of intraoperative microscope ? ?SURGEON: Dr. Lisbeth Renshaw, MD ? ?ASSISTANT: Dr. Hoyt Koch, MD ? ?ANESTHESIA: General Endotracheal ? ?EBL: 50cc ? ?SPECIMENS: None ? ?DRAINS: None ? ?COMPLICATIONS: None immediate ? ?CONDITION: Hemodynamically stable to PACU ? ?HISTORY: ?[name] is a 51 y.o. y.o. female who initially presented to the outpatient clinic with signs and symptoms consistent with left-sided cervical radiculopathy.  She MRI demonstrated a large C6-7 disc herniation with inferior migration and compression of both the spinal cord as well as the C7 and C8 roots. She attempted to undergo multiple different conservative treatments without significant improvement in her symptoms.  Given the lack of improvement and the radiographic findings she elected to proceed with surgical decompression and fusion.  After all questions were answered, informed consent was obtained. ? ?PROCEDURE IN DETAIL: ?The patient was brought to the operating room and transferred to the operative table. After induction of general anesthesia, the patient was positioned on the operative table in the supine position with all pressure points meticulously padded. The skin of the neck was then prepped and draped in the usual sterile fashion. ? ?After timeout was conducted, the skin was infiltrated with local anesthetic. Skin incision was then made sharply and Bovie electrocautery was used to dissect the subcutaneous tissue until the  platysma was identified. The platysma was then divided and undermined. The sternocleidomastoid muscle was then identified and, utilizing natural fascial planes in the neck, the prevertebral fascia was identified and the carotid sheath was retracted laterally and the trachea and esophagus retracted medially. Using fluoroscopy, the correct disc space was identified. Bovie electrocautery was used to dissect in the subperiosteal plane and elevate the bilateral longus coli muscles. Self-retaining retractors were then placed. At this point, the microscope was draped and brought into the field, and the remainder of the case was done under the microscope using microdissecting technique. ? ?The disc space was incised sharply and rongeurs were use to initially complete a discectomy. The high-speed drill was then used to complete discectomy until the posterior annulus was identified and removed and the posterior longitudinal ligament was identified. Using a nerve hook, the PLL was elevated, and Kerrison rongeurs were used to remove the posterior longitudinal ligament.  Large.  Free disc fragments were then noted on the left side of the thecal sac extending inferiorly behind the C7 vertebral body.  In addition, the left C7 nerve root appeared to be slightly ventrally displaced.  The high-speed drill was used to remove the uncovertebral joint on the left side.  This allowed identification of the proximal portion of the left C7 nerve root for a length of nearly 1 cm.  I was then able to palpate the left C7 pedicle.  Using a combination of micro and ball-tipped dissectors, multiple large fragments of disc were removed from behind the C7 vertebral body.  I was unable to place a microdissector dorsal to the left C7 nerve root and I did not identify any disc fragments.  No further disc fragments were removed from behind the C7 vertebral body  on the thecal sac appeared to take a much more normal position within the canal. ? ?Having  completed our decompression, attention was turned to placement of the intervertebral device. Trial spacers were used to select a 64mm lordotic wide graft. This graft was then filled with morcellized allograft, and inserted under fluoroscopy. After placement of the intervertebral device, the above anterior cervical plate was selected, and placed across the interspace. Using a high-speed drill, the cortex of the cervical vertebral bodies was punctured, and screws inserted in C6 and C7. Final fluoroscopic images in lateral projections were taken to confirm good hardware placement. ? ?At this point, after all counts were verified to be correct, meticulous hemostasis was secured using a combination of bipolar electrocautery and passive hemostatics. The platysma muscle was then closed using interrupted 3-0 Vicryl sutures, and the skin was closed with an interrupted 3-0 Vicry subcuticular stitch. Dermabond and sterile dressings were then applied and the drapes removed. ? ?The patient tolerated the procedure well and was extubated in the room and taken to the postanesthesia care unit in stable condition. ? ? ?Consuella Lose, MD ?[facility]  ? ?

## 2021-08-26 NOTE — Transfer of Care (Signed)
Immediate Anesthesia Transfer of Care Note ? ?Patient: [name] ? ?Procedure(s) Performed: Anterior Cervical Decompression/Discectomy Fusion Cervical Six-Seven ? ?Patient Location: PACU ? ?Anesthesia Type:General ? ?Level of Consciousness: awake, alert  and oriented ? ?Airway & Oxygen Therapy: Patient Spontanous Breathing and Patient connected to face mask oxygen ? ?Post-op Assessment: Report given to RN and Post -op Vital signs reviewed and stable ? ?Post vital signs: Reviewed and stable ? ?Last Vitals:  ?Vitals Value Taken Time  ?BP 147/130 08/26/21 1036  ?Temp    ?Pulse 69 08/26/21 1036  ?Resp 18 08/26/21 1036  ?SpO2 98 % 08/26/21 1036  ?Vitals shown include unvalidated device data. ? ?Last Pain:  ?Vitals:  ? 08/26/21 0544  ?TempSrc: Oral  ?   ? ?  ? ?Complications: No notable events documented. ?

## 2021-08-26 NOTE — Discharge Summary (Signed)
?Physician Discharge Summary  ?Patient ID: ?[name] ?MRN: [id number] ?DOB/AGE: 12-01-1970 51 y.o. ? ?Admit date: 08/26/2021 ?Discharge date: 08/26/2021 ? ?Admission Diagnoses: Cervical HNP C6-7 ? ?Discharge Diagnoses: Same ?Principal Problem: ?  HNP (herniated nucleus pulposus) with myelopathy, cervical ? ? ?Discharged Condition: Stable ? ?Hospital Course:  ?[name] is a 51 y.o. female electively admitted after ACDF. Postoperatively, the patient was at neurologic baseline. She was tolerating diet, ambulating independently, voiding normally, with pain controlled with oral medication. ? ?Treatments: Surgery - ACDF C6-7 ? ?Discharge Exam: ?Blood pressure 140/73, pulse 80, temperature (!) 97.5 ?F (36.4 ?C), resp. rate 20, height 5\' 3"  (1.6 m), weight 81.6 kg, SpO2 99 %. ?Awake, alert, oriented ?Speech fluent, appropriate ?CN grossly intact ?5/5 BUE/BLE ?Wound c/d/i ? ?Follow-up: Follow-up in my office [facility] [phone]) in 2-3 weeks ? ?Disposition: Discharge disposition: 01-Home or Self Care ? ? ? ? ? ? ?Discharge Instructions   ? ? Call MD for:  redness, tenderness, or signs of infection (pain, swelling, redness, odor or green/yellow discharge around incision site)   Complete by: As directed ?  ? Call MD for:  temperature >100.4   Complete by: As directed ?  ? Diet - low sodium heart healthy   Complete by: As directed ?  ? Discharge instructions   Complete by: As directed ?  ? Walk at home as much as possible, at least 4 times / day  ? Incentive spirometry RT   Complete by: As directed ?  ? Increase activity slowly   Complete by: As directed ?  ? Lifting restrictions   Complete by: As directed ?  ? No lifting > 10 lbs  ? May shower / Bathe   Complete by: As directed ?  ? 48 hours after surgery  ? May walk up steps   Complete by: As directed ?  ? Other Restrictions   Complete by: As directed ?  ? No bending/twisting at waist  ? Remove dressing in 48 hours   Complete by:  As directed ?  ? ?  ? ?Allergies as of 08/26/2021   ? ?   Reactions  ? Heparin Other (See Comments)  ? 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 ?  ? ?  ?Medication List  ?  ? ?TAKE these medications   ? ?acetaminophen 500 MG tablet ?Commonly known as: TYLENOL ?Take 500 mg by mouth every 6 (six) hours as needed for moderate pain. ?  ?amLODipine 5 MG tablet ?Commonly known as: NORVASC ?Take 1 tablet by mouth once daily ?  ?aspirin EC 81 MG tablet ?Take 1 tablet (81 mg total) by mouth daily. ?Start taking on: August 29, 2021 ?What changed: These instructions start on August 29, 2021. If you are unsure what to do until then, ask your doctor or other care provider. ?  ?carvedilol 12.5 MG tablet ?Commonly known as: COREG ?TAKE 1 TABLET BY MOUTH TWICE DAILY WITH A MEAL ?  ?Fish Oil 1200 MG Caps ?Take 1,200 mg by mouth at bedtime. ?  ?insulin glargine 100 UNIT/ML injection ?Commonly known as: LANTUS ?Inject 30 Units into the skin at bedtime. ?  ?lisinopril 40 MG tablet ?Commonly known as: ZESTRIL ?Take 1 tablet by mouth daily ?  ?lovastatin 40 MG tablet ?Commonly known as: MEVACOR ?Take 40 mg by mouth at bedtime. ?  ?metFORMIN 500 MG tablet ?Commonly known as: GLUCOPHAGE ?Take 1 tablet (500 mg  total) by mouth 2 (two) times daily with a meal. Pt will need to establish care with PCP for next refills. ?  ?nitroGLYCERIN 0.4 MG SL tablet ?Commonly known as: Nitrostat ?Place 1 tablet (0.4 mg total) under the tongue every 5 (five) minutes as needed for chest pain. ?  ?oxyCODONE-acetaminophen 5-325 MG tablet ?Commonly known as: Percocet ?Take 1-2 tablets by mouth every 4 (four) hours as needed for up to 7 days for severe pain. ?  ?pantoprazole 40 MG tablet ?Commonly known as: PROTONIX ?Take 1 tablet by mouth once daily ?What changed: when to take this ?  ?tiZANidine 4 MG tablet ?Commonly known as: ZANAFLEX ?Take 4 mg by mouth at bedtime as needed for muscle spasms. ?  ?Trulicity 0.75  MG/0.5ML Sopn ?Generic drug: Dulaglutide ?Inject 0.75 mg into the skin every Wednesday. ?  ?Tylenol Sinus Severe 5-325-200 MG Tabs ?Generic drug: Phenylephrine-APAP-guaiFENesin ?Take 2 tablets by mouth daily as needed (congestion). ?  ? ?  ? ? ? ?Signed: ?Jackelyn Hoehn ?08/26/2021, 11:50 AM ? ?

## 2021-08-26 NOTE — Anesthesia Postprocedure Evaluation (Signed)
Anesthesia Post Note ? ?Patient: [name] ? ?Procedure(s) Performed: Anterior Cervical Decompression/Discectomy Fusion Cervical Six-Seven ? ?  ? ?Patient location during evaluation: PACU ?Anesthesia Type: General ?Level of consciousness: awake ?Pain management: pain level controlled ?Respiratory status: spontaneous breathing ?Cardiovascular status: stable ?Postop Assessment: no apparent nausea or vomiting ?Anesthetic complications: no ? ? ?No notable events documented. ? ?Last Vitals:  ?Vitals:  ? 08/26/21 1230 08/26/21 1235  ?BP:  140/64  ?Pulse: 75 72  ?Resp: (!) 22 16  ?Temp:    ?SpO2: 100% 100%  ?  ?Last Pain:  ?Vitals:  ? 08/26/21 1205  ?TempSrc:   ?PainSc: Asleep  ? ? ?  ?  ?  ?  ?  ?  ? ?[name] ? ? ? ? ?

## 2021-08-26 NOTE — H&P (Signed)
?Chief Complaint  ?Neck and arm pain ? ?History of Present Illness  ?[name] is a 51 y.o. female initially seen in the outpatient neurosurgery clinic complaining of relatively severe left-sided neck and arm pain.  Symptoms started approximately 10 months ago moving a concrete statue in her yard.  Since then she has been complaining of severe left-sided pain involving her arm, forearm, and the medial aspect of her left hand including the pinky finger.  She has attempted multiple conservative treatments without significant relief.  Her imaging is revealed a large inferiorly migrated disc herniation at C6-7.  She therefore elected to proceed with surgical decompression and fusion.  Of note, she does have a significant cardiac history and has seen her cardiologist for preoperative risk stratification. ? ?Past Medical History  ? ?Past Medical History:  ?Diagnosis Date  ? CAD (coronary artery disease)   ? cath 09/07/2014 DES to 95% prox RCA, residual 70-80% origin of AV groove, 90% ostial PDA stenosis; 01/2015, NSTEMI w/ CABG, LIMA-LAD, SVG-OM1, SVG-PCA  ? Carotid artery disease (HCC)   ? a. 40-59% BICA by duplex 01/2015.  ? Diabetes mellitus without complication (HCC)   ? H/O medication noncompliance   ? Hyperlipidemia   ? Hypertension   ? MI (myocardial infarction) (HCC)   ? Tobacco abuse   ? ? ?Past Surgical History  ? ?Past Surgical History:  ?Procedure Laterality Date  ? ABDOMINAL HYSTERECTOMY    ? CARDIAC CATHETERIZATION N/A 02/01/2015  ? Procedure: Left Heart Cath and Coronary Angiography;  Surgeon: Runell Gess, MD;  Location: [facility] INVASIVE CV LAB;  Service: Cardiovascular;  Laterality: N/A;  ? CARDIAC CATHETERIZATION N/A 02/07/2015  ? Procedure: IABP Insertion;  Surgeon: Dolores Patty, MD;  Location: [facility] INVASIVE CV LAB;  Service: Cardiovascular;  Laterality: N/A;  ? CORONARY ARTERY BYPASS GRAFT N/A 02/07/2015  ? Procedure: CORONARY ARTERY BYPASS GRAFTING (CABG);  Surgeon: Kerin Perna, MD;  LIMA-LAD, SVT-OM1, SVG-PDA  ? LEFT HEART CATHETERIZATION WITH CORONARY ANGIOGRAM N/A 05/29/2014  ? Procedure: LEFT HEART CATHETERIZATION WITH CORONARY ANGIOGRAM;  Surgeon: Corky Crafts, MD;  Location: [facility] CATH LAB;  Service: Cardiovascular;  Laterality: N/A;  ? LEFT HEART CATHETERIZATION WITH CORONARY ANGIOGRAM N/A 09/07/2014  ? Procedure: LEFT HEART CATHETERIZATION WITH CORONARY ANGIOGRAM;  Surgeon: Marykay Lex, MD;  Location: [facility] CATH LAB;  Service: Cardiovascular;  Laterality: N/A;  ? PERCUTANEOUS CORONARY STENT INTERVENTION (PCI-S)  05/29/2014  ? Procedure: PERCUTANEOUS CORONARY STENT INTERVENTION (PCI-S);  Surgeon: Corky Crafts, MD;  Location: [facility] CATH LAB;  Service: Cardiovascular;;  ? TEE WITHOUT CARDIOVERSION N/A 02/07/2015  ? Procedure: TRANSESOPHAGEAL ECHOCARDIOGRAM (TEE);  Surgeon: Kerin Perna, MD;  Location: [facility] OR;  Service: Open Heart Surgery;  Laterality: N/A;  ? ? ?Social History  ? ?Social History  ? ?Tobacco Use  ? Smoking status: Former  ?  Packs/day: 0.00  ?  Types: Cigarettes  ?  Quit date: 01/10/2015  ?  Years since quitting: 6.6  ? Smokeless tobacco: Never  ?Vaping Use  ? Vaping Use: Never used  ?Substance Use Topics  ? Alcohol use: No  ?  Alcohol/week: 0.0 standard drinks  ? Drug use: No  ? ? ?Medications  ? ?Prior to Admission medications   ?Medication Sig Start Date End Date Taking? Authorizing Provider  ?acetaminophen (TYLENOL) 500 MG tablet Take 500 mg by mouth every 6 (six) hours as needed for moderate pain.   Yes [provider]  ?amLODipine (NORVASC) 5 MG tablet Take 1 tablet  by mouth once daily 07/28/21  Yes Runell Gess, MD  ?aspirin EC 81 MG tablet Take 81 mg by mouth daily.   Yes [provider]  ?carvedilol (COREG) 12.5 MG tablet TAKE 1 TABLET BY MOUTH TWICE DAILY WITH A MEAL 06/23/21  Yes Runell Gess, MD  ?Dulaglutide (TRULICITY) 0.75 MG/0.5ML SOPN Inject 0.75 mg into the skin every Wednesday.   Yes [provider]  ?insulin  glargine (LANTUS) 100 UNIT/ML injection Inject 30 Units into the skin at bedtime.   Yes [provider]  ?lisinopril (ZESTRIL) 40 MG tablet Take 1 tablet by mouth daily 10/25/19  Yes Runell Gess, MD  ?lovastatin (MEVACOR) 40 MG tablet Take 40 mg by mouth at bedtime.   Yes [provider]  ?metFORMIN (GLUCOPHAGE) 500 MG tablet Take 1 tablet (500 mg total) by mouth 2 (two) times daily with a meal. Pt will need to establish care with PCP for next refills. 09/27/15  Yes Runell Gess, MD  ?nitroGLYCERIN (NITROSTAT) 0.4 MG SL tablet Place 1 tablet (0.4 mg total) under the tongue every 5 (five) minutes as needed for chest pain. 04/11/21  Yes Runell Gess, MD  ?Omega-3 Fatty Acids (FISH OIL) 1200 MG CAPS Take 1,200 mg by mouth at bedtime.   Yes [provider]  ?pantoprazole (PROTONIX) 40 MG tablet Take 1 tablet by mouth once daily ?Patient taking differently: Take 40 mg by mouth at bedtime. 04/29/21  Yes Runell Gess, MD  ?tiZANidine (ZANAFLEX) 4 MG tablet Take 4 mg by mouth at bedtime as needed for muscle spasms. 06/02/21  Yes [provider]  ?Phenylephrine-APAP-guaiFENesin (TYLENOL SINUS SEVERE) 5-325-200 MG TABS Take 2 tablets by mouth daily as needed (congestion).    [provider]  ? ? ?Allergies  ? ?Allergies  ?Allergen Reactions  ? Heparin Other (See Comments)  ?  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 ? ?Review of Systems  ?ROS ? ?Neurologic Exam  ?Awake, alert, oriented ?Memory and concentration grossly intact ?Speech fluent, appropriate ?CN grossly intact ?Motor exam: ?Upper Extremities Deltoid Bicep Tricep Grip  ?Right 5/5 5/5 5/5 5/5  ?Left 5/5 5/5 5/5 5/5  ? ?Lower Extremities IP Quad PF DF EHL  ?Right 5/5 5/5 5/5 5/5 5/5  ?Left 5/5 5/5 5/5 5/5 5/5  ? ?Sensation grossly intact to LT ? ?Imaging  ?MRI of the cervical spine without contrast was personally reviewed.  This study reveals primary finding at  C6-7 where there is a relatively large left-sided inferiorly migrated disc herniation with associated severe stenosis including deformation of the spinal cord as well as compression of the left C7 nerve root. ? ?Impression  ?- 51 y.o. female with primarily symptoms of left C7 radiculopathy and imaging revealing significant spinal cord compression as well as C7 root compression. ? ?Plan  ?-We will plan on proceeding with C6-7 ACDF. ? ?I have reviewed the details of the operation as well as the expected postoperative course and recovery with the patient.  We have also discussed the associated risks, benefits, and alternatives to surgery including her cardiac risk profile.  All her questions were answered and she provided informed consent to proceed. ? ? ?Lisbeth Renshaw, MD ?[facility]  ? ?

## 2021-08-26 NOTE — Anesthesia Procedure Notes (Signed)
Procedure Name: Intubation ?Date/Time: 08/26/2021 8:11 AM ?Performed by: Pearson Grippe, CRNA ?Pre-anesthesia Checklist: Patient identified, Emergency Drugs available, Suction available and Patient being monitored ?Patient Re-evaluated:Patient Re-evaluated prior to induction ?Oxygen Delivery Method: Circle system utilized ?Preoxygenation: Pre-oxygenation with 100% oxygen ?Induction Type: IV induction ?Ventilation: Mask ventilation without difficulty ?Laryngoscope Size: Hyacinth Meeker and 2 ?Grade View: Grade I ?Tube type: Oral ?Tube size: 7.0 mm ?Number of attempts: 1 ?Airway Equipment and Method: Stylet and Oral airway ?Placement Confirmation: ETT inserted through vocal cords under direct vision, positive ETCO2 and breath sounds checked- equal and bilateral ?Secured at: 21 cm ?Tube secured with: Tape ?Dental Injury: Teeth and Oropharynx as per pre-operative assessment  ? ? ? ? ?

## 2021-08-27 ENCOUNTER — Encounter (HOSPITAL_COMMUNITY): Payer: Self-pay | Admitting: Neurosurgery

## 2021-12-26 ENCOUNTER — Other Ambulatory Visit: Payer: Self-pay | Admitting: Cardiovascular Disease

## 2021-12-29 ENCOUNTER — Telehealth: Payer: Self-pay | Admitting: Cardiovascular Disease

## 2021-12-29 NOTE — Telephone Encounter (Signed)
*  STAT* If patient is at the pharmacy, call can be transferred to refill team.   1. Which medications need to be refilled? (please list name of each medication and dose if known) carvedilol (COREG) 12.5 MG tablet  lovastatin (MEVACOR) 40 MG tablet  2. Which pharmacy/location (including street and city if local pharmacy) is medication to be sent to? [id number] - , [place] - [address]  3. Do they need a 30 day or 90 day supply? 90

## 2021-12-30 MED ORDER — CARVEDILOL 12.5 MG PO TABS: 12.5000 mg | ORAL_TABLET | Freq: Two times a day (BID) | ORAL | 3 refills | Status: DC

## 2021-12-30 MED ORDER — LOVASTATIN 40 MG PO TABS: 40.0000 mg | ORAL_TABLET | Freq: Every day | ORAL | 0 refills | Status: DC

## 2022-04-03 ENCOUNTER — Other Ambulatory Visit: Payer: Self-pay | Admitting: Cardiovascular Disease

## 2022-04-22 ENCOUNTER — Other Ambulatory Visit: Payer: Self-pay | Admitting: Cardiovascular Disease

## 2022-06-08 ENCOUNTER — Telehealth: Payer: Self-pay | Admitting: Cardiovascular Disease

## 2022-06-08 NOTE — Telephone Encounter (Signed)
   Pt said, she received a letter for an appt for life line screening. It has a different location on it and she wants to make sure its the right place to go to she said, it Dr. Gwenlyn Found who orders this test for her

## 2022-06-08 NOTE — Telephone Encounter (Signed)
Patient called - notified that we did not order a life line screening for her. Per chart review, her last carotid doppler was in 2019 and was a PRN f/u recommendation. No further assistance needed.

## 2022-07-16 ENCOUNTER — Other Ambulatory Visit: Payer: Self-pay | Admitting: Cardiovascular Disease

## 2022-07-23 ENCOUNTER — Other Ambulatory Visit: Payer: Self-pay | Admitting: Cardiovascular Disease

## 2022-07-24 ENCOUNTER — Ambulatory Visit: Payer: 59 | Attending: Nurse Practitioner | Admitting: Nurse Practitioner

## 2022-07-24 ENCOUNTER — Encounter: Payer: Self-pay | Admitting: Nurse Practitioner

## 2022-07-24 VITALS — BP 125/78 | HR 75 | Ht 63.0 in | Wt 180.0 lb

## 2022-07-24 DIAGNOSIS — I2583 Coronary atherosclerosis due to lipid rich plaque: Secondary | ICD-10-CM | POA: Diagnosis not present

## 2022-07-24 DIAGNOSIS — E119 Type 2 diabetes mellitus without complications: Secondary | ICD-10-CM

## 2022-07-24 DIAGNOSIS — Z7984 Long term (current) use of oral hypoglycemic drugs: Secondary | ICD-10-CM

## 2022-07-24 DIAGNOSIS — I2510 Atherosclerotic heart disease of native coronary artery without angina pectoris: Secondary | ICD-10-CM

## 2022-07-24 DIAGNOSIS — E118 Type 2 diabetes mellitus with unspecified complications: Secondary | ICD-10-CM

## 2022-07-24 DIAGNOSIS — M5020 Other cervical disc displacement, unspecified cervical region: Secondary | ICD-10-CM | POA: Insufficient documentation

## 2022-07-24 DIAGNOSIS — Z862 Personal history of diseases of the blood and blood-forming organs and certain disorders involving the immune mechanism: Secondary | ICD-10-CM

## 2022-07-24 DIAGNOSIS — Z0100 Encounter for examination of eyes and vision without abnormal findings: Secondary | ICD-10-CM

## 2022-07-24 DIAGNOSIS — Z1159 Encounter for screening for other viral diseases: Secondary | ICD-10-CM | POA: Diagnosis not present

## 2022-07-24 DIAGNOSIS — K219 Gastro-esophageal reflux disease without esophagitis: Secondary | ICD-10-CM

## 2022-07-24 DIAGNOSIS — Z7985 Long-term (current) use of injectable non-insulin antidiabetic drugs: Secondary | ICD-10-CM

## 2022-07-24 MED ORDER — LOVASTATIN 40 MG PO TABS: 40.0000 mg | ORAL_TABLET | Freq: Every day | ORAL | 3 refills | Status: DC

## 2022-07-24 MED ORDER — METFORMIN HCL 500 MG PO TABS: 500.0000 mg | ORAL_TABLET | Freq: Two times a day (BID) | ORAL | 1 refills | Status: DC

## 2022-07-24 MED ORDER — PANTOPRAZOLE SODIUM 40 MG PO TBEC: 40.0000 mg | DELAYED_RELEASE_TABLET | Freq: Every day | ORAL | 1 refills | Status: DC

## 2022-07-24 NOTE — Progress Notes (Signed)
Assessment & Plan:  [name] was seen today for establish care.  Diagnoses and all orders for this visit:  Diabetes mellitus type 2 with complications ([place]) -     Microalbumin / creatinine urine ratio -     Hemoglobin A1c -     metFORMIN (GLUCOPHAGE) 500 MG tablet; Take 1 tablet (500 mg total) by mouth 2 (two) times daily with a meal. -     CMP14+EGFR Continue blood sugar control as discussed in office today, low carbohydrate diet, and regular physical exercise as tolerated, 150 minutes per week (30 min each day, 5 days per week, or 50 min 3 days per week). Keep blood sugar logs with fasting goal of 90-130 mg/dl, post prandial (after you eat) less than 180.  For Hypoglycemia: BS <60 and Hyperglycemia BS >400; contact the clinic ASAP. Annual eye exams and foot exams are recommended.   Diabetic eye exam ([place]) -     Ambulatory referral to Ophthalmology  Need for hepatitis C screening test -     HCV Ab w Reflex to Quant PCR  GERD without esophagitis -     pantoprazole (PROTONIX) 40 MG tablet; Take 1 tablet (40 mg total) by mouth daily. INSTRUCTIONS: Avoid GERD Triggers: acidic, spicy or fried foods, caffeine, coffee, sodas,  alcohol and chocolate.    Coronary artery disease due to lipid rich plaque -     lovastatin (MEVACOR) 40 MG tablet; Take 1 tablet (40 mg total) by mouth at bedtime. -     Lipid panel  History of anemia -     CBC with Differential    Patient has been counseled on age-appropriate routine health concerns for screening and prevention. These are reviewed and up-to-date. Referrals have been placed accordingly. Immunizations are up-to-date or declined.    Subjective:   Chief Complaint  Patient presents with   Establish Care   HPI [name] 52 y.o. female presents to office today to establish care.   Patient has been counseled on age-appropriate routine health concerns for screening and prevention. These are reviewed and up-to-date. Referrals have been  placed accordingly. Immunizations are up-to-date or declined.     MAMMOGRAM: DECLINED COLONOSCOPY: DECLINED PAP SMEAR: DECLINED   She has a past medical history of CAD, ICM, Carotid artery disease, DM2, H/O medication noncompliance, Hyperlipidemia, Hypertension, MI (Followed by Cardiology), 3v CAD (CABG 2016) PEA arrest, and Tobacco abuse.   DM 2 Average home blood glucose readings: 130-200s. She is currently taking Trulicity 0.75mg  weekly and metformin 500 mg BID. She has not picked up lantus in quite some time. Was previously prescribed a combination insulin-GLP1 that her insurance would not cover. Will hold lantus until her A1c is resulted from this visit today.  Lab Results  Component Value Date   HGBA1C 7.0 (H) 08/20/2021     HTN Blood pressure is well controlled.  She is currently taking amlodipine 5 mg daily, carvedilol 12.5 mg twice daily, lisinopril 40 mg daily. BP Readings from Last 3 Encounters:  07/24/22 125/78  08/26/21 140/64  08/20/21 140/86    Review of Systems  Constitutional:  Negative for fever, malaise/fatigue and weight loss.  HENT: Negative.  Negative for nosebleeds.   Eyes: Negative.  Negative for blurred vision, double vision and photophobia.  Respiratory: Negative.  Negative for cough and shortness of breath.   Cardiovascular: Negative.  Negative for chest pain, palpitations and leg swelling.  Gastrointestinal:  Positive for heartburn. Negative for nausea and vomiting.  Musculoskeletal: Negative.  Negative for myalgias.  Neurological: Negative.  Negative for dizziness, focal weakness, seizures and headaches.  Psychiatric/Behavioral: Negative.  Negative for suicidal ideas.     Past Medical History:  Diagnosis Date   CAD (coronary artery disease)    cath 09/07/2014 DES to 95% prox RCA, residual 70-80% origin of AV groove, 90% ostial PDA stenosis; 01/2015, NSTEMI w/ CABG, LIMA-LAD, SVG-OM1, SVG-PCA   Carotid artery disease ([place])    a. 40-59% BICA by duplex  01/2015.   Diabetes mellitus without complication ([name])    H/O medication noncompliance    Hyperlipidemia    Hypertension    MI (myocardial infarction) ([name])    Tobacco abuse     Past Surgical History:  Procedure Laterality Date   ABDOMINAL HYSTERECTOMY  2008   ANTERIOR CERVICAL DECOMP/DISCECTOMY FUSION N/A 08/26/2021   Procedure: Anterior Cervical Decompression/Discectomy Fusion Cervical Six-Seven;  Surgeon: [name] Lose, MD;  Location: [facility] [place];  Service: Neurosurgery;  Laterality: N/A;  3C   CARDIAC CATHETERIZATION N/A 02/01/2015   Procedure: Left Heart Cath and Coronary Angiography;  Surgeon: [name] Harp, MD;  Location: [facility];  Service: Cardiovascular;  Laterality: N/A;   CARDIAC CATHETERIZATION N/A 02/07/2015   Procedure: IABP Insertion;  Surgeon: [name] Artist, MD;  Location: [facility];  Service: Cardiovascular;  Laterality: N/A;   CORONARY ARTERY BYPASS GRAFT N/A 02/07/2015   Procedure: CORONARY ARTERY BYPASS GRAFTING (CABG);  Surgeon: [name] Poot, MD; LIMA-LAD, SVT-OM1, SVG-PDA   LEFT HEART CATHETERIZATION WITH CORONARY ANGIOGRAM N/A 05/29/2014   Procedure: LEFT HEART CATHETERIZATION WITH CORONARY ANGIOGRAM;  Surgeon: [name] Booze, MD;  Location[facility] LAB;  Service: Cardiovascular;  Laterality: N/A;   LEFT HEART CATHETERIZATION WITH CORONARY ANGIOGRAM N/A 09/07/2014   Procedure: LEFT HEART CATHETERIZATION WITH CORONARY ANGIOGRAM;  Surgeon: [name] Man, MD;  Location: [facility] CATH LAB;  Service: Cardiovascular;  Laterality: N/A;   PERCUTANEOUS CORONARY STENT INTERVENTION (PCI-S)  05/29/2014   Procedure: PERCUTANEOUS CORONARY STENT INTERVENTION (PCI-S);  Surgeon: [name] Booze, MD;  Location: [facility] C[facility] LAB;  Service: Cardiovascular;;   TEE WITHOUT CARDIOVERSION N/A 02/07/2015   Procedure: TRANSESOPHAGEAL ECHOCARDIOGRAM (TEE);  Surgeon: [name] Poot, MD;  Location: [facility];  Service: Open Heart Surgery;  Laterality: N/A;    Family  History  Problem Relation Age of Onset   Hypertension Mother    Kidney failure Mother    Stroke Mother 24   Heart attack Father 63   Heart disease Father     Social History Reviewed with no changes to be made today.   Outpatient Medications Prior to Visit  Medication Sig Dispense Refill   acetaminophen (TYLENOL) 500 MG tablet Take 500 mg by mouth every 6 (six) hours as needed for moderate pain.     aspirin EC 81 MG tablet Take 1 tablet (81 mg total) by mouth daily. 30 tablet 11   carvedilol (COREG) 12.5 MG tablet Take 1 tablet (12.5 mg total) by mouth 2 (two) times daily with a meal. 180 tablet 3   Dulaglutide (TRULICITY) A999333 0000000 SOPN Inject 0.75 mg into the skin every Wednesday.     lisinopril (ZESTRIL) 40 MG tablet Take 1 tablet by mouth daily 90 tablet 1   nitroGLYCERIN (NITROSTAT) 0.4 MG SL tablet Place 1 tablet (0.4 mg total) under the tongue every 5 (five) minutes as needed for chest pain. 25 tablet 3   Omega-3 Fatty Acids (FISH OIL) 1200 MG CAPS Take 1,200 mg by mouth at bedtime.  Phenylephrine-APAP-guaiFENesin (TYLENOL SINUS SEVERE) 5-325-200 MG TABS Take 2 tablets by mouth daily as needed (congestion).     amLODipine (NORVASC) 5 MG tablet Take 1 tablet by mouth once daily 90 tablet 3   lovastatin (MEVACOR) 40 MG tablet Take 1 tablet (40 mg total) by mouth at bedtime. PATIENT MUST SCHEDULE ANNUAL OFFICE VISIT FOR FUTURE REFILLS 90 tablet 0   metFORMIN (GLUCOPHAGE) 500 MG tablet Take 1 tablet (500 mg total) by mouth 2 (two) times daily with a meal. Pt will need to establish care with PCP for next refills. 60 tablet 0   insulin glargine (LANTUS) 100 UNIT/ML injection Inject 30 Units into the skin at bedtime. (Patient not taking: Reported on 07/24/2022)     pantoprazole (PROTONIX) 40 MG tablet TAKE 1 TABLET BY MOUTH EVERY DAY (Patient not taking: Reported on 07/24/2022) 30 tablet 2   tiZANidine (ZANAFLEX) 4 MG tablet Take 4 mg by mouth at bedtime as needed for muscle spasms.  (Patient not taking: Reported on 07/24/2022)     No facility-administered medications prior to visit.    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 - Patient should NOT receive heparin (02/10/15 and 02/12/15)       Objective:    BP 125/78   Pulse 75   Ht 5\' 3"  (1.6 m)   Wt 180 lb (81.6 kg)   SpO2 100%   BMI 31.89 kg/m  Wt Readings from Last 3 Encounters:  07/24/22 180 lb (81.6 kg)  08/26/21 180 lb (81.6 kg)  08/20/21 180 lb 12.8 oz (82 kg)    Physical Exam Vitals and nursing note reviewed.  Constitutional:      Appearance: She is well-developed.  HENT:     Head: Normocephalic and atraumatic.  Cardiovascular:     Rate and Rhythm: Normal rate and regular rhythm.     Heart sounds: Normal heart sounds. No murmur heard.    No friction rub. No gallop.  Pulmonary:     Effort: Pulmonary effort is normal. No tachypnea or respiratory distress.     Breath sounds: Normal breath sounds. No decreased breath sounds, wheezing, rhonchi or rales.  Chest:     Chest wall: No tenderness.  Abdominal:     General: Bowel sounds are normal.     Palpations: Abdomen is soft.  Musculoskeletal:        General: Normal range of motion.     Cervical back: Normal range of motion.  Skin:    General: Skin is warm and dry.  Neurological:     Mental Status: She is alert and oriented to person, place, and time.     Coordination: Coordination normal.  Psychiatric:        Behavior: Behavior normal. Behavior is cooperative.        Thought Content: Thought content normal.        Judgment: Judgment normal.          Patient has been counseled extensively about nutrition and exercise as well as the importance of adherence with medications and regular follow-up. The patient was given clear instructions to go to ER or return to medical center if symptoms don't improve, worsen or new problems develop. The patient verbalized understanding.   Follow-up: Return  in 14 weeks (on 10/30/2022).   Gildardo Pounds, FNP-BC [facility] and [place], [place]   07/24/2022, 12:19 PM

## 2022-07-26 LAB — CMP14+EGFR
ALT: 20 IU/L (ref 0–32)
AST: 23 IU/L (ref 0–40)
Albumin/Globulin Ratio: 1.4 (ref 1.2–2.2)
Albumin: 4.1 g/dL (ref 3.9–4.9)
Alkaline Phosphatase: 68 IU/L (ref 44–121)
BUN/Creatinine Ratio: 20 (ref 9–23)
BUN: 20 mg/dL (ref 6–24)
Bilirubin Total: <0.2 mg/dL (ref 0.0–1.2)
CO2: 22 mmol/L (ref 20–29)
Calcium: 9.8 mg/dL (ref 8.7–10.2)
Chloride: 104 mmol/L (ref 96–106)
Creatinine, Ser: 0.98 mg/dL (ref 0.57–1.00)
Globulin, Total: 2.9 g/dL (ref 1.5–4.5)
Glucose: 131 mg/dL — ABNORMAL HIGH (ref 70–99)
Potassium: 5.3 mmol/L — ABNORMAL HIGH (ref 3.5–5.2)
Sodium: 140 mmol/L (ref 134–144)
Total Protein: 7 g/dL (ref 6.0–8.5)
eGFR: 70 mL/min/{1.73_m2} (ref >59)

## 2022-07-26 LAB — CBC WITH DIFFERENTIAL/PLATELET
Basophils Absolute: 0 10*3/uL (ref 0.0–0.2)
Basos: 1 %
EOS (ABSOLUTE): 0.2 10*3/uL (ref 0.0–0.4)
Eos: 3 %
Hematocrit: 38.1 % (ref 34.0–46.6)
Hemoglobin: 12.8 g/dL (ref 11.1–15.9)
Immature Grans (Abs): 0 10*3/uL (ref 0.0–0.1)
Immature Granulocytes: 0 %
Lymphocytes Absolute: 1.6 10*3/uL (ref 0.7–3.1)
Lymphs: 28 %
MCH: 30.1 pg (ref 26.6–33.0)
MCHC: 33.6 g/dL (ref 31.5–35.7)
MCV: 90 fL (ref 79–97)
Monocytes Absolute: 0.3 10*3/uL (ref 0.1–0.9)
Monocytes: 5 %
Neutrophils Absolute: 3.7 10*3/uL (ref 1.4–7.0)
Neutrophils: 63 %
Platelets: 196 10*3/uL (ref 150–450)
RBC: 4.25 x10E6/uL (ref 3.77–5.28)
RDW: 13.9 % (ref 11.7–15.4)
WBC: 5.8 10*3/uL (ref 3.4–10.8)

## 2022-07-26 LAB — MICROALBUMIN / CREATININE URINE RATIO
Creatinine, Urine: 119 mg/dL
Microalb/Creat Ratio: 4 mg/g creat (ref 0–29)
Microalbumin, Urine: 5 ug/mL

## 2022-07-26 LAB — HEMOGLOBIN A1C
Est. average glucose Bld gHb Est-mCnc: 171 mg/dL
Hgb A1c MFr Bld: 7.6 % — ABNORMAL HIGH (ref 4.8–5.6)

## 2022-07-26 LAB — LIPID PANEL
Chol/HDL Ratio: 4.9 ratio — ABNORMAL HIGH (ref 0.0–4.4)
Cholesterol, Total: 190 mg/dL (ref 100–199)
HDL: 39 mg/dL — ABNORMAL LOW (ref >39)
LDL Chol Calc (NIH): 112 mg/dL — ABNORMAL HIGH (ref 0–99)
Triglycerides: 223 mg/dL — ABNORMAL HIGH (ref 0–149)
VLDL Cholesterol Cal: 39 mg/dL (ref 5–40)

## 2022-07-26 LAB — HCV AB W REFLEX TO QUANT PCR: HCV Ab: NONREACTIVE

## 2022-07-26 LAB — HCV INTERPRETATION

## 2022-07-27 ENCOUNTER — Other Ambulatory Visit: Payer: Self-pay | Admitting: Nurse Practitioner

## 2022-07-27 DIAGNOSIS — E118 Type 2 diabetes mellitus with unspecified complications: Secondary | ICD-10-CM

## 2022-07-27 MED ORDER — TRULICITY 1.5 MG/0.5ML ~~LOC~~ SOAJ: 1.5000 mg | SUBCUTANEOUS | 0 refills | Status: DC

## 2022-07-29 ENCOUNTER — Telehealth: Payer: Self-pay | Admitting: Emergency Medicine

## 2022-07-29 ENCOUNTER — Other Ambulatory Visit: Payer: Self-pay

## 2022-07-29 DIAGNOSIS — E118 Type 2 diabetes mellitus with unspecified complications: Secondary | ICD-10-CM

## 2022-07-29 MED ORDER — TRULICITY 1.5 MG/0.5ML ~~LOC~~ SOAJ
1.5000 mg | SUBCUTANEOUS | 0 refills | Status: DC
  Filled 2022-07-29 – 2022-08-10 (×2): qty 2, 28d supply, fill #0

## 2022-07-29 MED ORDER — TRULICITY 1.5 MG/0.5ML ~~LOC~~ SOAJ: 1.5000 mg | SUBCUTANEOUS | 0 refills | Status: DC

## 2022-07-29 NOTE — Telephone Encounter (Signed)
Copied from [place] [id number]. Topic: General - Other >> Jul 29, 2022 11:31 AM Oley Balm A wrote: Reason for CRM: Pt prescription was sent to the wrong pharmacy Dulaglutide (TRULICITY) 1.5 0000000 SOPN. Please [facility] medication [facility]: [id number] [address], [place]  Phone: [phone] Fax: [phone]

## 2022-07-29 NOTE — Telephone Encounter (Signed)
Pt stated she has been advised by the pharmacy that medication (TRULICITY) 1.5 MG is backordered, and they don't know when they will have it? Pt is asking what she should do, and she needs an alternative.     Please advise.

## 2022-07-29 NOTE — Telephone Encounter (Signed)
[place] friend. Our pharmacy has Trulicity at the higher dose. I resent the rx to our pharmacy. Could you help me by calling her and letting her know it is available but it's at our pharmacy here at [facility]?

## 2022-07-29 NOTE — Addendum Note (Signed)
Addended by: Abbie Sons L on: 07/29/2022 01:48 PM   Modules accepted: Orders

## 2022-08-10 ENCOUNTER — Other Ambulatory Visit: Payer: Self-pay

## 2022-08-10 ENCOUNTER — Telehealth: Payer: Self-pay | Admitting: Nurse Practitioner

## 2022-08-10 NOTE — Telephone Encounter (Signed)
Copied from [place] [id number]. Topic: General - Other >> Aug 10, 2022  1:17 PM Oley Balm E wrote: Reason for CRM: Pt called for update on insulin, she says she has been waiting for a return call from the office.

## 2022-08-10 NOTE — Telephone Encounter (Signed)
Pt is calling to report that Trulity is out of stock at [facility]. Pt would like to know does Zelda want to change the medication to something else. Please advise

## 2022-08-10 NOTE — Telephone Encounter (Signed)
Patient identified by name and date of birth. Patient aware of message from [place] and voiced understanding.  Marland Kitchen

## 2022-08-12 ENCOUNTER — Telehealth: Payer: Self-pay | Admitting: Pharmacist

## 2022-08-12 NOTE — Telephone Encounter (Signed)
Pt's Trulicity 1.5 mg pen is on backorder. Routing pended order for Ozempic to patient's PCP to see if we can switch from Trulicity to [place].

## 2022-08-12 NOTE — Telephone Encounter (Signed)
Noted  

## 2022-08-12 NOTE — Telephone Encounter (Signed)
Message has been sent to pt's PCP to see if she'll consider changing Trulicity to Ozempic.

## 2022-08-12 NOTE — Telephone Encounter (Addendum)
  Medication Refill - Medication:  Trulicity ...   Dulaglutide (TRULICITY) 1.5 0000000 SOPN    Has the patient contacted their pharmacy? Yes.   Pharmacy stated it is on back order (1.5) patient has question regarding th current .75 she is taking, should  she double it?   (Preferred Pharmacy (with phone number or street name):  [id number] [address] [place]  [place] [address]  Phone: [phone] Fax: [phone]  Hours: Not open 24 hour    Has the patient been seen for an appointment in the last year OR does the patient have an upcoming appointment? Yes.    Agent: Please be advised that RX refills may take up to 3 business days. We ask that you follow-up with your pharmacy.

## 2022-08-13 ENCOUNTER — Other Ambulatory Visit: Payer: Self-pay | Admitting: Nurse Practitioner

## 2022-08-13 MED ORDER — OZEMPIC (0.25 OR 0.5 MG/DOSE) 2 MG/3ML ~~LOC~~ SOPN: 0.5000 mg | PEN_INJECTOR | SUBCUTANEOUS | 1 refills | Status: DC

## 2022-08-14 ENCOUNTER — Telehealth: Payer: Self-pay | Admitting: Pharmacist

## 2022-08-14 ENCOUNTER — Other Ambulatory Visit: Payer: Self-pay

## 2022-08-14 NOTE — Telephone Encounter (Signed)
Can we start a PA for this patient's Ozempic? Trulicity 1.5mg  pen is on backorder so we switched to Ozempic.

## 2022-08-17 ENCOUNTER — Other Ambulatory Visit: Payer: Self-pay

## 2022-08-17 ENCOUNTER — Other Ambulatory Visit: Payer: Self-pay | Admitting: Nurse Practitioner

## 2022-08-17 DIAGNOSIS — E118 Type 2 diabetes mellitus with unspecified complications: Secondary | ICD-10-CM

## 2022-08-17 MED ORDER — VICTOZA 18 MG/3ML ~~LOC~~ SOPN: PEN_INJECTOR | SUBCUTANEOUS | 3 refills | Status: DC

## 2022-08-17 NOTE — Telephone Encounter (Signed)
We can definitely try victoza. I will send now. Thanks!!

## 2022-09-04 ENCOUNTER — Encounter: Payer: Self-pay | Admitting: Cardiovascular Disease

## 2022-09-04 ENCOUNTER — Ambulatory Visit: Payer: 59 | Attending: Cardiovascular Disease | Admitting: Cardiovascular Disease

## 2022-09-04 VITALS — BP 138/68 | HR 70 | Ht 63.0 in | Wt 179.2 lb

## 2022-09-04 DIAGNOSIS — I255 Ischemic cardiomyopathy: Secondary | ICD-10-CM

## 2022-09-04 DIAGNOSIS — I10 Essential (primary) hypertension: Secondary | ICD-10-CM

## 2022-09-04 DIAGNOSIS — I2583 Coronary atherosclerosis due to lipid rich plaque: Secondary | ICD-10-CM

## 2022-09-04 DIAGNOSIS — E785 Hyperlipidemia, unspecified: Secondary | ICD-10-CM | POA: Diagnosis not present

## 2022-09-04 DIAGNOSIS — I2510 Atherosclerotic heart disease of native coronary artery without angina pectoris: Secondary | ICD-10-CM

## 2022-09-04 MED ORDER — ATORVASTATIN CALCIUM 40 MG PO TABS: 40.0000 mg | ORAL_TABLET | Freq: Every day | ORAL | 3 refills | Status: DC

## 2022-09-04 NOTE — Assessment & Plan Note (Signed)
History of CAD status post RCA intervention past with reintervention/29/16.  She underwent recatheterization by myself 02/01/2015 revealing three-vessel disease with moderate LV dysfunction.  She underwent CABG x 3 by Dr. Maren Beach 02/07/2015 with LIMA to LAD, vein to the obtuse marginal branch and the PDA.  Several days later she had a PEA arrest requiring resuscitation, intubation and placement of an intra-aortic balloon pump.  Ultimately she was discharged home.  She currently denies chest pain or shortness of breath.  Recent Myoview stress test performed 03/08/2018 showed inferolateral scar without ischemia.

## 2022-09-04 NOTE — Assessment & Plan Note (Signed)
History of ischemic cardiomyopathy with 2D echo performed 05/06/2021 revealing EF of 35 to 40% with grade 2 diastolic dysfunction few abnormalities.  Check a 2D echo I am going to recheck a 2D echocardiogram.  She is is on carvedilol and lisinopril.

## 2022-09-04 NOTE — Progress Notes (Signed)
09/04/2022 [name]   Dec 26, 1970  [id number]  Primary Physician Claiborne Rigg, NP Primary Cardiologist: Runell Gess MD FACP, [place], [place], [place]  HPI:  [name] is a 52 y.o.    moderately overweight African-American female with history of tobacco abuse, diabetes, hypertension, hyperlipidemia and medication noncompliance. I last saw her in the office  04/11/2021. She's had RCA intervention in the past with re-intervention on 09/07/14. She had accelerated angina and underwent repeat cardiac catheterization by myself 02/01/15 revealing three-vessel disease with moderate LV dysfunction.  She underwent coronary artery bypass grafting X 3 by Dr. Kathlee Nations Trigt 02/07/15 with a LIMA to LAD, vein to obtuse marginal branch and PDA. Several days postop she had a PEA arrest requiring resuscitation, intubation and placement of an intra-aortic balloon pump. Ultimately she was discharged home. She is recuperating nicely. She did stop smoking. Her most recent 2-D echo performed 08/19/15 revealed EF of 45-50%. Since I saw her back she's remained clinically stable. She does not smoke. She eats a relatively healthy diet according to her.  She did have a Myoview stress test performed 03/08/2018 that was remarkable for inferolateral scar.   She has had issues with difficult to control hypertension. She did see Azalee Course PA-in the clinic since her last office visit with me who initiated lisinopril with escalating doses.   Since I saw her in the office nearly half ago she continues to do well.  Smoking the time of bypass surgery.  She denies chest pain or shortness of breath.   Current Meds  Medication Sig   acetaminophen (TYLENOL) 500 MG tablet Take 500 mg by mouth every 6 (six) hours as needed for moderate pain.   amLODipine (NORVASC) 5 MG tablet Take 1 tablet (5 mg total) by mouth daily. Keep upcoming appt for future refills.   aspirin EC 81 MG tablet Take 1 tablet (81 mg total) by mouth  daily.   atorvastatin (LIPITOR) 40 MG tablet Take 1 tablet (40 mg total) by mouth daily.   carvedilol (COREG) 12.5 MG tablet Take 1 tablet (12.5 mg total) by mouth 2 (two) times daily with a meal.   insulin glargine (LANTUS) 100 UNIT/ML injection Inject 30 Units into the skin at bedtime.   liraglutide (VICTOZA) 18 MG/3ML SOPN Initial: 0.6 mg once daily for 1 week, then increase to 1.2 mg once daily; if optimal glycemic response is not achieved after an additional week of treatment, may increase further to 1.8 mg once daily. (Patient taking differently: Initial: 0.6 mg once daily for 1 week, then increase to 1.2 mg once daily; if optimal glycemic response is not achieved after an additional week of treatment, may increase further to 1.8 mg once daily.)   lisinopril (ZESTRIL) 40 MG tablet Take 1 tablet by mouth daily   metFORMIN (GLUCOPHAGE) 500 MG tablet Take 1 tablet (500 mg total) by mouth 2 (two) times daily with a meal.   nitroGLYCERIN (NITROSTAT) 0.4 MG SL tablet Place 1 tablet (0.4 mg total) under the tongue every 5 (five) minutes as needed for chest pain.   Omega-3 Fatty Acids (FISH OIL) 1200 MG CAPS Take 1,200 mg by mouth at bedtime.   pantoprazole (PROTONIX) 40 MG tablet Take 1 tablet (40 mg total) by mouth daily.   Phenylephrine-APAP-guaiFENesin (TYLENOL SINUS SEVERE) 5-325-200 MG TABS Take 2 tablets by mouth daily as needed (congestion).   TRULICITY 1.5 MG/0.5ML SOPN Inject 2 mLs into the skin once a week.   [  DISCONTINUED] lovastatin (MEVACOR) 40 MG tablet Take 1 tablet (40 mg total) by mouth at bedtime.     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 - Patient should NOT receive heparin (02/10/15 and 02/12/15)    Social History   Socioeconomic History   Marital status: Single    Spouse name: Not on file   Number of children: 2   Years of education: Not on file   Highest education level: Not on file  Occupational History   Occupation:  CNA    Employer: [employer]  Tobacco Use   Smoking status: Former    Packs/day: 0.00    Years: 22.00    Additional pack years: 0.00    Total pack years: 0.00    Types: Cigarettes    Quit date: 01/10/2015    Years since quitting: 7.6   Smokeless tobacco: Never  Vaping Use   Vaping Use: Never used  Substance and Sexual Activity   Alcohol use: No    Alcohol/week: 0.0 standard drinks of alcohol   Drug use: No   Sexual activity: Not on file  Other Topics Concern   Not on file  Social History Narrative   Not on file   Social Determinants of Health   Financial Resource Strain: Not on file  Food Insecurity: Not on file  Transportation Needs: Not on file  Physical Activity: Not on file  Stress: Not on file  Social Connections: Not on file  Intimate Partner Violence: Not on file     Review of Systems: General: negative for chills, fever, night sweats or weight changes.  Cardiovascular: negative for chest pain, dyspnea on exertion, edema, orthopnea, palpitations, paroxysmal nocturnal dyspnea or shortness of breath Dermatological: negative for rash Respiratory: negative for cough or wheezing Urologic: negative for hematuria Abdominal: negative for nausea, vomiting, diarrhea, bright red blood per rectum, melena, or hematemesis Neurologic: negative for visual changes, syncope, or dizziness All other systems reviewed and are otherwise negative except as noted above.    Blood pressure 138/68, pulse 70, height 5\' 3"  (1.6 m), weight 179 lb 3.2 oz (81.3 kg), SpO2 98 %.  General appearance: alert and no distress Neck: no adenopathy, no carotid bruit, no JVD, supple, symmetrical, trachea midline, and thyroid not enlarged, symmetric, no tenderness/mass/nodules Lungs: clear to auscultation bilaterally Heart: regular rate and rhythm, S1, S2 normal, no murmur, click, rub or gallop Extremities: extremities normal, atraumatic, no cyanosis or edema Pulses: 2+ and symmetric Skin:  Skin color, texture, turgor normal. No rashes or lesions Neurologic: Grossly normal  EKG sinus rhythm at 70 with left axis deviation and lateral T wave inversion, and poor R wave progression.  I personally reviewed this EKG.  ASSESSMENT AND PLAN:   Essential hypertension History of essential hypertension a blood pressure measured today at 138/68.  She is on amlodipine, carvedilol and lisinopril.  Hyperlipidemia with target LDL less than 70 History of hyperlipidemia on lovastatin with lipid profile performed 07/24/2022 revealing total cholesterol 190, LDL of 112 and HDL of 39, not at goal for secondary prevention.  I am going to change her lovastatin to atorvastatin and we will recheck a lipid liver profile in 3 months.  Coronary artery disease due to lipid rich plaque History of CAD status post RCA intervention past with reintervention/29/16.  She underwent recatheterization by myself 02/01/2015 revealing three-vessel disease with moderate LV dysfunction.  She underwent CABG x 3 by Dr. Maren Beach 02/07/2015  with LIMA to LAD, vein to the obtuse marginal branch and the PDA.  Several days later she had a PEA arrest requiring resuscitation, intubation and placement of an intra-aortic balloon pump.  Ultimately she was discharged home.  She currently denies chest pain or shortness of breath.  Recent Myoview stress test performed 03/08/2018 showed inferolateral scar without ischemia.  Ischemic cardiomyopathy History of ischemic cardiomyopathy with 2D echo performed 05/06/2021 revealing EF of 35 to 40% with grade 2 diastolic dysfunction few abnormalities.  Check a 2D echo I am going to recheck a 2D echocardiogram.  She is is on carvedilol and lisinopril.     Runell Gess MD FACP,FACC,FAHA, [facility] 09/04/2022 2:27 PM

## 2022-09-04 NOTE — Assessment & Plan Note (Signed)
History of essential hypertension a blood pressure measured today at 138/68.  She is on amlodipine, carvedilol and lisinopril.

## 2022-09-04 NOTE — Patient Instructions (Addendum)
Medication Instructions:  Your physician has recommended you make the following change in your medication:   -Stop lovastatin (mevacor).  -Start atorvastatin (lipitor) 40mg  once daily.  *If you need a refill on your cardiac medications before your next appointment, please call your pharmacy*   Lab Work: Your physician recommends that you return for lab work in: 3 months for FASTING Lipid/liver panel  If you have labs (blood work) drawn today and your tests are completely normal, you will receive your results only by: MyChart Message (if you have MyChart) OR A paper copy in the mail If you have any lab test that is abnormal or we need to change your treatment, we will call you to review the results.   Testing/Procedures: Your physician has requested that you have an echocardiogram. Echocardiography is a painless test that uses sound waves to create images of your heart. It provides your doctor with information about the size and shape of your heart and how well your heart's chambers and valves are working. This procedure takes approximately one hour. There are no restrictions for this procedure. Please do NOT wear cologne, perfume, aftershave, or lotions (deodorant is allowed). Please arrive 15 minutes prior to your appointment time. This will take place at [address]   Follow-Up: At [facility], you and your health needs are our priority.  As part of our continuing mission to provide you with exceptional heart care, we have created designated Provider Care Teams.  These Care Teams include your primary Cardiologist (physician) and Advanced Practice Providers (APPs -  Physician Assistants and Nurse Practitioners) who all work together to provide you with the care you need, when you need it.  We recommend signing up for the patient portal called "MyChart".  Sign up information is provided on this After Visit Summary.  MyChart is used to connect with patients for  Virtual Visits (Telemedicine).  Patients are able to view lab/test results, encounter notes, upcoming appointments, etc.  Non-urgent messages can be sent to your provider as well.   To learn more about what you can do with MyChart, go to [email or web address].    Your next appointment:   12 month(s)  Provider:   [name], MD

## 2022-09-04 NOTE — Assessment & Plan Note (Signed)
History of hyperlipidemia on lovastatin with lipid profile performed 07/24/2022 revealing total cholesterol 190, LDL of 112 and HDL of 39, not at goal for secondary prevention.  I am going to change her lovastatin to atorvastatin and we will recheck a lipid liver profile in 3 months.

## 2022-09-07 ENCOUNTER — Ambulatory Visit: Payer: 59 | Admitting: Pharmacist

## 2022-09-08 ENCOUNTER — Other Ambulatory Visit: Payer: Self-pay | Admitting: Cardiovascular Disease

## 2022-09-16 ENCOUNTER — Other Ambulatory Visit: Payer: Self-pay

## 2022-09-17 ENCOUNTER — Other Ambulatory Visit: Payer: Self-pay

## 2022-09-18 ENCOUNTER — Other Ambulatory Visit: Payer: Self-pay

## 2022-09-18 ENCOUNTER — Other Ambulatory Visit: Payer: Self-pay | Admitting: Nurse Practitioner

## 2022-09-18 ENCOUNTER — Telehealth: Payer: Self-pay

## 2022-09-18 DIAGNOSIS — E118 Type 2 diabetes mellitus with unspecified complications: Secondary | ICD-10-CM

## 2022-09-18 MED ORDER — LISINOPRIL 40 MG PO TABS: 40.0000 mg | ORAL_TABLET | Freq: Every day | ORAL | 1 refills | Status: DC

## 2022-09-18 MED ORDER — VICTOZA 18 MG/3ML ~~LOC~~ SOPN: PEN_INJECTOR | SUBCUTANEOUS | 0 refills | Status: DC

## 2022-09-18 NOTE — Telephone Encounter (Signed)
Hey friend, are you able to help me? We changed from Ozempic to [employer] requiring trial and failure of Victoza and Trulicity before they will approve Ozempic. We chose Victoza bc of the Trulicity shortages. Can we attempt  PA for the Victoza?

## 2022-09-18 NOTE — Telephone Encounter (Signed)
Medication Refill - Medication: lisinopril (ZESTRIL) 40 MG tablet   Has the patient contacted their pharmacy? Yes.     Preferred Pharmacy (with phone number or street name):  [phone] [place], [place] - [address] Phone: [phone]  Fax: [phone]      Has the patient been seen for an appointment in the last year OR does the patient have an upcoming appointment? Yes.    Please assist patient further

## 2022-09-18 NOTE — Telephone Encounter (Signed)
Noted  

## 2022-09-18 NOTE — Telephone Encounter (Signed)
Requested Prescriptions  Pending Prescriptions Disp Refills   liraglutide (VICTOZA) 18 MG/3ML SOPN 3 mL 0    Sig: Initial: 0.6 mg once daily for 1 week, then increase to 1.2 mg once daily; if optimal glycemic response is not achieved after an additional week of treatment, may increase further to 1.8 mg once daily.     Endocrinology:  Diabetes - GLP-1 Receptor Agonists Passed - 09/18/2022 11:29 AM      Passed - HBA1C is between 0 and 7.9 and within 180 days    Hgb A1c MFr Bld  Date Value Ref Range Status  07/24/2022 7.6 (H) 4.8 - 5.6 % Final    Comment:             Prediabetes: 5.7 - 6.4          Diabetes: >6.4          Glycemic control for adults with diabetes: <7.0          Passed - Valid encounter within last 6 months    Recent Outpatient Visits           1 month ago Diabetes mellitus type 2 with complications [facility])   [place] [facility] [place], Shea Stakes, NP       Future Appointments             In 1 month Claiborne Rigg, NP [facility]             TRULICITY 1.5 MG/0.5ML SOPN      Sig: Inject 6 mg into the skin once a week.     Endocrinology:  Diabetes - GLP-1 Receptor Agonists Passed - 09/18/2022 11:29 AM      Passed - HBA1C is between 0 and 7.9 and within 180 days    Hgb A1c MFr Bld  Date Value Ref Range Status  07/24/2022 7.6 (H) 4.8 - 5.6 % Final    Comment:             Prediabetes: 5.7 - 6.4          Diabetes: >6.4          Glycemic control for adults with diabetes: <7.0          Passed - Valid encounter within last 6 months    Recent Outpatient Visits           1 month ago Diabetes mellitus type 2 with complications [facility])   [place] [facility] [place], Shea Stakes, NP       Future Appointments             In 1 month Claiborne Rigg, NP [facility]

## 2022-09-18 NOTE — Telephone Encounter (Signed)
Requested medications are due for refill today.  no  Requested medications are on the active medications list.  See note  Last refill. Ordered today  Future visit scheduled.   yes  Notes to clinic.  Pharmacy comment: Alternative Requested:NEEDS A PA.     Requested Prescriptions  Pending Prescriptions Disp Refills   VICTOZA 18 MG/3ML SOPN [Pharmacy Med Name: VICTOZA 3-PAK 18 MG/3 ML PEN]  0    Sig: Initial: 0.6 mg once daily for 1 week, then increase to 1.2 mg once daily; if optimal glycemic response is not achieved after an additional week of treatment, may increase further to 1.8 mg once daily.     Endocrinology:  Diabetes - GLP-1 Receptor Agonists Passed - 09/18/2022  2:15 PM      Passed - HBA1C is between 0 and 7.9 and within 180 days    Hgb A1c MFr Bld  Date Value Ref Range Status  07/24/2022 7.6 (H) 4.8 - 5.6 % Final    Comment:             Prediabetes: 5.7 - 6.4          Diabetes: >6.4          Glycemic control for adults with diabetes: <7.0          Passed - Valid encounter within last 6 months    Recent Outpatient Visits           1 month ago Diabetes mellitus type 2 with complications [facility])   [place] [facility] [place], Shea Stakes, NP       Future Appointments             In 1 month Claiborne Rigg, NP [facility]

## 2022-09-18 NOTE — Telephone Encounter (Signed)
We changed from Ozempic to [employer] requiring trial and failure of Victoza and Trulicity before they will approve Ozempic. We chose Victoza bc of the Trulicity shortages. Can we attempt PA for the Victoza or are the Trulicity syringes back in stock?

## 2022-09-18 NOTE — Telephone Encounter (Signed)
Copied from CRM [id number]. Topic: General - Other >> Sep 18, 2022 12:22 PM Ja-Kwan M wrote: Reason for CRM: Pt stated that she now has [employer] as her insurance and they informed her that they need authorization for the TRULICITY 1.5 MG/0.5ML SOPN. Pt provided her BC[employer] Id# [id number] and asked that the authorization be sent so [facility] can [id number] [place],  - [address]

## 2022-09-18 NOTE — Telephone Encounter (Signed)
Medication Refill - Medication: liraglutide (VICTOZA) 18 MG/3ML SOPN  and TRULICITY 1.5 MG/0.5ML SOPN   Has the patient contacted their pharmacy? Yes.       (Preferred Pharmacy (with phone number or street name):  [phone] [place] [place] - [address]  [address] [place] [address]  Phone: [phone] Fax: [phone]  Hours: Not open 24 hours    Has the patient been seen for an appointment in the last year OR does the patient have an upcoming appointment? Yes.    Agent: Please be advised that RX refills may take up to 3 business days. We ask that you follow-up with your pharmacy.

## 2022-09-18 NOTE — Telephone Encounter (Signed)
Requested medication (s) are due for refill today: routing for review  Requested medication (s) are on the active medication list: yes  Last refill:  10/25/19  Future visit scheduled: yes  Notes to clinic:  Unable to refill per protocol, last refill by another provider.  Routing for review.     Requested Prescriptions  Pending Prescriptions Disp Refills   lisinopril (ZESTRIL) 40 MG tablet 90 tablet 1    Sig: Take 1 tablet (40 mg total) by mouth daily.     Cardiovascular:  ACE Inhibitors Failed - 09/18/2022  2:05 PM      Failed - K in normal range and within 180 days    Potassium  Date Value Ref Range Status  07/24/2022 5.3 (H) 3.5 - 5.2 mmol/L Final         Passed - Cr in normal range and within 180 days    Creat  Date Value Ref Range Status  03/19/2015 0.69 0.50 - 1.10 mg/dL Final   Creatinine, Ser  Date Value Ref Range Status  07/24/2022 0.98 0.57 - 1.00 mg/dL Final         Passed - Patient is not pregnant      Passed - Last BP in normal range    BP Readings from Last 1 Encounters:  09/04/22 138/68         Passed - Valid encounter within last 6 months    Recent Outpatient Visits           1 month ago Diabetes mellitus type 2 with complications [facility])   [place] [facility] [place], Shea Stakes, NP       Future Appointments             In 1 month Claiborne Rigg, NP [facility]

## 2022-09-18 NOTE — Telephone Encounter (Signed)
Requested medication (s) are due for refill today: yes  Requested medication (s) are on the active medication list: yes  Last refill:  09/04/22  Future visit scheduled: yes  Notes to clinic:  Unable to refill per protocol, last refill by another provider.  Historical provider, routing for approval.     Requested Prescriptions  Pending Prescriptions Disp Refills   TRULICITY 1.5 MG/0.5ML SOPN      Sig: Inject 6 mg into the skin once a week.     Endocrinology:  Diabetes - GLP-1 Receptor Agonists Passed - 09/18/2022 11:29 AM      Passed - HBA1C is between 0 and 7.9 and within 180 days    Hgb A1c MFr Bld  Date Value Ref Range Status  07/24/2022 7.6 (H) 4.8 - 5.6 % Final    Comment:             Prediabetes: 5.7 - 6.4          Diabetes: >6.4          Glycemic control for adults with diabetes: <7.0          Passed - Valid encounter within last 6 months    Recent Outpatient Visits           1 month ago Diabetes mellitus type 2 with complications [facility])   [place] [facility] [place], Shea Stakes, NP       Future Appointments             In 1 month Claiborne Rigg, NP [facility]            Signed Prescriptions Disp Refills   liraglutide (VICTOZA) 18 MG/3ML SOPN 3 mL 0    Sig: Initial: 0.6 mg once daily for 1 week, then increase to 1.2 mg once daily; if optimal glycemic response is not achieved after an additional week of treatment, may increase further to 1.8 mg once daily.     Endocrinology:  Diabetes - GLP-1 Receptor Agonists Passed - 09/18/2022 11:29 AM      Passed - HBA1C is between 0 and 7.9 and within 180 days    Hgb A1c MFr Bld  Date Value Ref Range Status  07/24/2022 7.6 (H) 4.8 - 5.6 % Final    Comment:             Prediabetes: 5.7 - 6.4          Diabetes: >6.4          Glycemic control for adults with diabetes: <7.0          Passed - Valid encounter within last 6 months    Recent Outpatient  Visits           1 month ago Diabetes mellitus type 2 with complications [facility])    [facility] [place], Shea Stakes, NP       Future Appointments             In 1 month Claiborne Rigg, NP [facility]

## 2022-09-21 ENCOUNTER — Other Ambulatory Visit: Payer: Self-pay | Admitting: Pharmacist

## 2022-09-21 ENCOUNTER — Other Ambulatory Visit: Payer: Self-pay

## 2022-09-23 ENCOUNTER — Telehealth: Payer: Self-pay

## 2022-09-23 NOTE — Telephone Encounter (Signed)
No update regarding prior authorization at this time. Prior authorization for Trulicity was submitted 09/22/2022 via CoverMyMeds Key: [id number]. Patient has [employer] of [place] insurance and based on past experience, [employer] takes about 7 business days to process claims. I will follow-up on prior authorization status on 10/02/22 if we haven't received a response.  

## 2022-09-23 NOTE — Telephone Encounter (Signed)
No update regarding prior authorization at this time. Prior authorization for Trulicity was submitted 09/22/2022 via CoverMyMeds Key: [id number]. Patient has BCBS of [employer] and based on past experience, [employer] takes about 7 business days to process claims. I will follow-up on prior authorization status on 10/02/22 if we haven't received a response.

## 2022-09-25 ENCOUNTER — Other Ambulatory Visit: Payer: Self-pay

## 2022-09-26 ENCOUNTER — Other Ambulatory Visit: Payer: Self-pay | Admitting: Cardiovascular Disease

## 2022-09-28 ENCOUNTER — Telehealth: Payer: Self-pay | Admitting: Cardiovascular Disease

## 2022-09-28 ENCOUNTER — Ambulatory Visit: Payer: Self-pay

## 2022-09-28 ENCOUNTER — Other Ambulatory Visit: Payer: Self-pay

## 2022-09-28 MED ORDER — CARVEDILOL 12.5 MG PO TABS: 12.5000 mg | ORAL_TABLET | Freq: Two times a day (BID) | ORAL | 3 refills | Status: DC

## 2022-09-28 NOTE — Telephone Encounter (Signed)
  Chief Complaint: medication assistance  Symptoms: NA Frequency: today Pertinent Negatives: NA Disposition: [] ED /[] Urgent Care (no appt availability in office) / [] Appointment(In office/virtual)/ []  [place] Virtual Care/ [x] Home Care/ [] Refused Recommended Disposition /[] [place] [facility]/ []  Follow-up with PCP Additional Notes: pt wanted to make sure she suppose to start TRULICITY before going to pharmacy. Advised pt yes that she is to start TRULICITY and PA was approved to 09/2023. Pt verbalized understanding and no further assistance noted.   Summary: how to take medication   Patient wants to know if provider wants her to take the TRULICITY 1.5 MG/0.5ML SOPN only or if she wants her to add the over the counter insulin she was taking also. Please advise         Reason for Disposition  Caller has medicine question only, adult not sick, AND triager answers question  Answer Assessment - Initial Assessment Questions 1. NAME of MEDICINE: "What medicine(s) are you calling about?"     Trulicity  2. QUESTION: "What is your question?" (e.g., double dose of medicine, side effect)     Wanting to know if she starts that or continue with OTC insulin  3. PRESCRIBER: "Who prescribed the medicine?" Reason: if prescribed by specialist, call should be referred to that group.     Zelda, NP  Protocols used: Medication Question Call-A-AH

## 2022-09-28 NOTE — Telephone Encounter (Signed)
Approved until 09/22/2023

## 2022-09-28 NOTE — Telephone Encounter (Signed)
*  STAT* If patient is at the pharmacy, call can be transferred to refill team.   1. Which medications need to be refilled? (please list name of each medication and dose if known) carvedilol (COREG) 12.5 MG tablet   2. Which pharmacy/location (including street and city if local pharmacy) is medication to be sent to? [id number] [place] [place] - [address]  3. Do they need a 30 day or 90 day supply? 90

## 2022-10-01 ENCOUNTER — Ambulatory Visit: Payer: Self-pay

## 2022-10-01 NOTE — Telephone Encounter (Signed)
Message from [place] sent at 10/01/2022 11:48 AM EDT  Summary: Blood sugar over 300   Pt called in states Blood sugar is over 300, with no symptoms , she took trulicity 1.5 on Monday or Wednesday, but its over 300 today.         Chief Complaint: BS 323 and 302 today - has not eaten just a cup of coffee Symptoms: denies Frequency: 1145 and during call.  Pertinent Negatives: Patient denies frequent urination, difficulty breathing, dizziness, weakness or vomiting Disposition: [] ED /[] Urgent Care (no appt availability in office) / [] Appointment(In office/virtual)/ []  [place] Virtual [facility]/ [] Home Care/ [] Refused Recommended Disposition /[] [place] [facility]/ [x]  Follow-up with PCP Additional Notes: Pt took her Trulicity on Sunday. Is taking Metformin and Trulicity. Please advise. Pt checked her BS for the first time since new therapy today.  Reason for Disposition  [1] Blood glucose > 300 mg/dL (16.7 mmol/L) AND [2] does not  use insulin (e.g., not insulin-dependent; most people with type 2 diabetes)  [1] Blood glucose > 300 mg/dL (16.7 mmol/L) AND [2] two or more times in a row  Answer Assessment - Initial Assessment Questions 1. BLOOD GLUCOSE: "What is your blood glucose level?"      323 fasting  now 302 2. ONSET: "When did you check the blood glucose?"     Just before call  3. USUAL RANGE: "What is your glucose level usually?" (e.g., usual fasting morning value, usual evening value)     12 0 Trulicity with OTC insulin and could not afford the Humalog  4. KETONES: "Do you check for ketones (urine or blood test strips)?" If Yes, ask: "What does the test show now?"      N/a 5. TYPE 1 or 2:  "Do you know what type of diabetes you have?"  (e.g., Type 1, Type 2, Gestational; doesn't know)      Type 2  6. INSULIN: "Do you take insulin?" "What type of insulin(s) do you use? What is the mode of delivery? (syringe, pen; injection or pump)?"     Stopped Sunday   7. DIABETES PILLS:  "Do you take any pills for your diabetes?" If Yes, ask: "Have you missed taking any pills recently?"     Metformin 8. OTHER SYMPTOMS: "Do you have any symptoms?" (e.g., fever, frequent urination, difficulty breathing, dizziness, weakness, vomiting)     no 9. PREGNANCY: "Is there any chance you are pregnant?" "When was your last menstrual period?"     N/a  Protocols used: Diabetes - High Blood Sugar-A-AH

## 2022-10-01 NOTE — Telephone Encounter (Signed)
Increase metformin to 2 tablets in the morning and 2 in the evening.

## 2022-10-02 ENCOUNTER — Ambulatory Visit (HOSPITAL_COMMUNITY): Payer: BLUE CROSS/BLUE SHIELD | Attending: Cardiovascular Disease

## 2022-10-02 ENCOUNTER — Other Ambulatory Visit: Payer: Self-pay

## 2022-10-02 DIAGNOSIS — I10 Essential (primary) hypertension: Secondary | ICD-10-CM | POA: Diagnosis present

## 2022-10-02 DIAGNOSIS — I2583 Coronary atherosclerosis due to lipid rich plaque: Secondary | ICD-10-CM | POA: Diagnosis present

## 2022-10-02 DIAGNOSIS — I255 Ischemic cardiomyopathy: Secondary | ICD-10-CM | POA: Insufficient documentation

## 2022-10-02 DIAGNOSIS — I2510 Atherosclerotic heart disease of native coronary artery without angina pectoris: Secondary | ICD-10-CM | POA: Diagnosis not present

## 2022-10-02 DIAGNOSIS — E785 Hyperlipidemia, unspecified: Secondary | ICD-10-CM | POA: Insufficient documentation

## 2022-10-02 DIAGNOSIS — E118 Type 2 diabetes mellitus with unspecified complications: Secondary | ICD-10-CM

## 2022-10-02 LAB — ECHOCARDIOGRAM COMPLETE
Area-P 1/2: 4.52 cm2
S' Lateral: 4.4 cm

## 2022-10-02 MED ORDER — METFORMIN HCL 500 MG PO TABS: 1000.0000 mg | ORAL_TABLET | Freq: Two times a day (BID) | ORAL | 0 refills | Status: DC

## 2022-10-02 NOTE — Telephone Encounter (Signed)
Spoke with patient . Verified name & DOB   Advised per PCP Increase metformin to 2 tablets in the morning and 2 in the evening. New script sent to pharmacy. Patient voiced understanding of all discussed .

## 2022-10-07 ENCOUNTER — Other Ambulatory Visit: Payer: Self-pay

## 2022-10-09 ENCOUNTER — Other Ambulatory Visit (HOSPITAL_COMMUNITY): Payer: Self-pay

## 2022-10-09 ENCOUNTER — Encounter: Payer: Self-pay | Admitting: Pharmacist Clinician (PhC)/ Clinical Pharmacy Specialist

## 2022-10-09 ENCOUNTER — Ambulatory Visit
Payer: BLUE CROSS/BLUE SHIELD | Attending: Cardiology | Admitting: Pharmacist Clinician (PhC)/ Clinical Pharmacy Specialist

## 2022-10-09 ENCOUNTER — Telehealth: Payer: Self-pay | Admitting: Pharmacist Clinician (PhC)/ Clinical Pharmacy Specialist

## 2022-10-09 VITALS — BP 132/85 | HR 76 | Ht 63.0 in | Wt 187.0 lb

## 2022-10-09 DIAGNOSIS — I255 Ischemic cardiomyopathy: Secondary | ICD-10-CM

## 2022-10-09 MED ORDER — NITROGLYCERIN 0.4 MG SL SUBL: 0.4000 mg | SUBLINGUAL_TABLET | SUBLINGUAL | 3 refills | Status: DC | PRN

## 2022-10-09 MED ORDER — DAPAGLIFLOZIN PROPANEDIOL 10 MG PO TABS: 10.0000 mg | ORAL_TABLET | Freq: Every day | ORAL | 3 refills | Status: DC

## 2022-10-09 MED ORDER — DAPAGLIFLOZIN PROPANEDIOL 10 MG PO TABS
10.0000 mg | ORAL_TABLET | Freq: Every day | ORAL | 0 refills | Status: DC
Start: 2022-10-09 — End: 2022-10-30

## 2022-10-09 NOTE — Assessment & Plan Note (Signed)
Assessment: BP in office today is 132/85 Patient with HFrEF Tolerates current medications without any side effects - lisinopril, carvedilol Denies SOB, palpitation, chest pain, headaches,or edema No concerns with regards to cost of medications, compliance, ability to pick up at pharmacy Reiterated the importance of regular exercise and low salt diet  Plan: GDMT ACEI/ARB/ARNI Lisinopril 40 mg qd Will determine cost of Entresto before making switch - patient doesn't need burden of 2 expensive medications  Beta blocker Carvedilol 12.5 mg bid No change  MRA none Last K 5.3  SGLT2 none Starting Farxiga 10 mg daily today   Labs orderd:  none today Follow up with PharmD in 3 weeks for further titrations

## 2022-10-09 NOTE — Progress Notes (Signed)
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 female patient of Dr Allyson Sabal, in the office today for management of GDMT for HFrEF.  Patient had an echocardiogram recently that showed her EF had dropped to 30-35%.  Currently she is on lisinopril and carvedilol.   Blood Pressure Goal:  130/80  GDMT: ACEI/ARB/ARNI [x] Yes [] No Lisinopril 40  Beta blocker [x] Yes [] No Carvedilol 12.5 bid  MRA [] Yes [x] No Last K at 5.3 (07/24/22)  SGLT2 inhibitor [] Yes [x] No     Family Hx:  father died from MI, stroke at 14; mother had stroke at 66, then CKD died at 33; older sister with first MI last year at 74; both sisters with hypertension, other sister with DM; ; 2 children 42, 27 - older with new dm diagnosis    Social Hx:      Tobacco: no  Alcohol: no  Caffeine: coffee daily  Diet:  mostly home cooked meals; does use salt with cooking; variety of meats (not fish), vegetables usually frozen; doesn't snack much  Exercise: 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 [] Yes [x] No  Do you ever skip doses due to side effects? [] Yes [x] No  Do you have trouble affording your medicines? [] Yes [x] No  Are you ever unable to pick up  your medication due to transportation difficulties? [] Yes [x] No  Do you ever stop taking your medications because you don't believe they are helping? [] Yes [x] No    Accessory Clinical Findings    Lab Results  Component Value Date   CREATININE 0.98 07/24/2022   BUN 20 07/24/2022   NA 140 07/24/2022   K 5.3 (H) 07/24/2022   CL 104 07/24/2022   CO2 22 07/24/2022   Lab Results  Component Value Date   ALT 20 07/24/2022   AST 23 07/24/2022   ALKPHOS 68 07/24/2022   BILITOT <0.2 07/24/2022   Lab Results  Component Value Date   HGBA1C 7.6 (H) 07/24/2022    Home Medications/Allergies    Current Outpatient Medications  Medication Sig Dispense Refill   dapagliflozin propanediol (FARXIGA) 10 MG TABS tablet Take 1 tablet (10 mg total) by mouth daily before breakfast. 90 tablet 3   acetaminophen (TYLENOL) 500 MG tablet Take 500 mg by mouth every 6 (six) hours as needed for moderate pain.     amLODipine (NORVASC) 5 MG tablet TAKE 1 TABLET (5 MG TOTAL) BY MOUTH DAILY. KEEP UPCOMING APPT FOR FUTURE REFILLS. 30 tablet 1   aspirin EC 81 MG tablet Take 1 tablet (81 mg total) by mouth daily. 30 tablet 11   atorvastatin (LIPITOR) 40 MG tablet  Take 1 tablet (40 mg total) by mouth daily. 90 tablet 3   carvedilol (COREG) 12.5 MG tablet Take 1 tablet (12.5 mg total) by mouth 2 (two) times daily with a meal. 180 tablet 3   insulin glargine (LANTUS) 100 UNIT/ML injection Inject 30 Units into the skin at bedtime.     lisinopril (ZESTRIL) 40 MG tablet Take 1 tablet (40 mg total) by mouth daily. 90 tablet 1   metFORMIN (GLUCOPHAGE) 500 MG tablet Take 2 tablets (1,000 mg total) by mouth 2 (two) times daily with a meal. 360 tablet 0   nitroGLYCERIN (NITROSTAT) 0.4 MG SL tablet Place 1 tablet (0.4 mg total) under the tongue every 5 (five) minutes as needed for chest pain. 25 tablet 3   Omega-3 Fatty Acids (FISH OIL) 1200 MG CAPS Take 1,200 mg by mouth at bedtime.     pantoprazole (PROTONIX) 40 MG tablet  Take 1 tablet (40 mg total) by mouth daily. 90 tablet 1   Phenylephrine-APAP-guaiFENesin (TYLENOL SINUS SEVERE) 5-325-200 MG TABS Take 2 tablets by mouth daily as needed (congestion).     TRULICITY 1.5 MG/0.5ML SOPN Inject 2 mLs into the skin once a week.     No current facility-administered medications for this visit.     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 - Patient should NOT receive heparin (02/10/15 and 02/12/15)     Assessment & Plan      Ischemic cardiomyopathy Assessment: BP in office today is 132/85 Patient with HFrEF Tolerates current medications without any side effects - lisinopril, carvedilol Denies SOB, palpitation, chest pain, headaches,or edema No concerns with regards to cost of medications, compliance, ability to pick up at pharmacy Reiterated the importance of regular exercise and low salt diet  Plan: GDMT ACEI/ARB/ARNI Lisinopril 40 mg qd Will determine cost of Entresto before making switch - patient doesn't need burden of 2 expensive medications  Beta blocker Carvedilol 12.5 mg bid No change  MRA none Last K 5.3  SGLT2 none Starting Farxiga 10 mg daily today   Labs orderd:  none today Follow up with PharmD in 3 weeks for further titrations   Phillips Hay PharmD CPP [facility]  [address] [place], [place] [address] [phone]

## 2022-10-09 NOTE — Telephone Encounter (Signed)
Could you please do insurance investigation for Sherryll Burger and [name]?

## 2022-10-09 NOTE — Patient Instructions (Signed)
Follow up appointment: Wednesday June 26 at 3 pm  Take your BP meds as follows:  Guideline Directed Medication Therapy ACEI/ARB/ARNI - lisinopril [x] Yes [] No Lisinopril  40 mg daily - we will try to switch this to Entresto in the next few week  Beta blocker - carvedilol [x] Yes [] No Carvedilol 12.5 mg twice daily  MRA - spironolactone [] Yes [x] No Potassium level was too high to start this  SGLT2 inhibitor - Farxiga [x] Yes [] No Farxiga 10 mg daily   Check your blood pressure at home daily and keep record of the readings.  Hypertension "High blood pressure"  Hypertension is often called "The Silent Killer." It rarely causes symptoms until it is extremely  high or has done damage to other organs in the body. For this reason, you should have your  blood pressure checked regularly by your physician. We will check your blood pressure  every time you see a provider at one of our offices.   Your blood pressure reading consists of two numbers. Ideally, blood pressure should be  below 120/80. The first ("top") number is called the systolic pressure. It measures the  pressure in your arteries as your heart beats. The second ("bottom") number is called the diastolic pressure. It measures the pressure in your arteries as the heart relaxes between beats.  The benefits of getting your blood pressure under control are enormous. A 10-point  reduction in systolic blood pressure can reduce your risk of stroke by 27% and heart failure by 28%  Your blood pressure goal is < 130/80  To check your pressure at home you will need to:  1. Sit up in a chair, with feet flat on the floor and back supported. Do not cross your ankles or legs. 2. Rest your left arm so that the cuff is about heart level. If the cuff goes on your upper arm,  then just relax the arm on the table, arm of the chair or your lap. If you have a wrist cuff, we  suggest relaxing your wrist against your chest (think of it as Pledging  the Flag with the  wrong arm).  3. Place the cuff snugly around your arm, about 1 inch above the crook of your elbow. The  cords should be inside the groove of your elbow.  4. Sit quietly, with the cuff in place, for about 5 minutes. After that 5 minutes press the power  button to start a reading. 5. Do not talk or move while the reading is taking place.  6. Record your readings on a sheet of paper. Although most cuffs have a memory, it is often  easier to see a pattern developing when the numbers are all in front of you.  7. You can repeat the reading after 1-3 minutes if it is recommended  Make sure your bladder is empty and you have not had caffeine or tobacco within the last 30 min  Always bring your blood pressure log with you to your appointments. If you have not brought your monitor in to be double checked for accuracy, please bring it to your next appointment.  You can find a list of quality blood pressure cuffs at validatebp.org

## 2022-10-13 ENCOUNTER — Telehealth: Payer: Self-pay

## 2022-10-13 ENCOUNTER — Other Ambulatory Visit (HOSPITAL_COMMUNITY): Payer: Self-pay

## 2022-10-15 NOTE — Telephone Encounter (Signed)
LMOM to review price of medications.

## 2022-10-19 MED ORDER — SACUBITRIL-VALSARTAN 49-51 MG PO TABS: 1.0000 | ORAL_TABLET | Freq: Two times a day (BID) | ORAL | 6 refills | Status: DC

## 2022-10-19 NOTE — Telephone Encounter (Signed)
Spoke with patient.  $15/month is affordable.  Will have her d/c lisinopril 40 mg daily and start Entresto 2 days later.  Patient voiced understanding on directions.

## 2022-10-22 ENCOUNTER — Other Ambulatory Visit: Payer: Self-pay | Admitting: Cardiovascular Disease

## 2022-10-22 NOTE — Telephone Encounter (Signed)
See other phone message  

## 2022-10-30 ENCOUNTER — Ambulatory Visit: Payer: BLUE CROSS/BLUE SHIELD | Attending: Nurse Practitioner | Admitting: Nurse Practitioner

## 2022-10-30 ENCOUNTER — Encounter: Payer: Self-pay | Admitting: Nurse Practitioner

## 2022-10-30 VITALS — BP 118/77 | HR 83 | Ht 63.0 in | Wt 172.4 lb

## 2022-10-30 DIAGNOSIS — I10 Essential (primary) hypertension: Secondary | ICD-10-CM

## 2022-10-30 DIAGNOSIS — Z7984 Long term (current) use of oral hypoglycemic drugs: Secondary | ICD-10-CM | POA: Diagnosis not present

## 2022-10-30 DIAGNOSIS — E7800 Pure hypercholesterolemia, unspecified: Secondary | ICD-10-CM | POA: Diagnosis not present

## 2022-10-30 DIAGNOSIS — E118 Type 2 diabetes mellitus with unspecified complications: Secondary | ICD-10-CM | POA: Diagnosis not present

## 2022-10-30 DIAGNOSIS — Z7985 Long-term (current) use of injectable non-insulin antidiabetic drugs: Secondary | ICD-10-CM

## 2022-10-30 LAB — POCT GLYCOSYLATED HEMOGLOBIN (HGB A1C): HbA1c, POC (controlled diabetic range): 8.7 % — AB (ref 0.0–7.0)

## 2022-10-30 MED ORDER — LANTUS SOLOSTAR 100 UNIT/ML ~~LOC~~ SOPN: 15.0000 [IU] | PEN_INJECTOR | Freq: Every day | SUBCUTANEOUS | 99 refills | Status: DC

## 2022-10-30 MED ORDER — LANTUS SOLOSTAR 100 UNIT/ML ~~LOC~~ SOPN
10.0000 [IU] | PEN_INJECTOR | Freq: Every day | SUBCUTANEOUS | 99 refills | Status: DC
Start: 2022-10-30 — End: 2023-02-26

## 2022-10-30 NOTE — Progress Notes (Signed)
Patient reports that her glucose was at 200 this morning.

## 2022-10-30 NOTE — Progress Notes (Signed)
Assessment & Plan:  [name] was seen today for diabetes.  Diagnoses and all orders for this visit:  Diabetes mellitus type 2 with complications (HCC) Reistart lantus  -     POCT glycosylated hemoglobin (Hb A1C) -     insulin glargine (LANTUS SOLOSTAR) 100 UNIT/ML Solostar Pen; Inject 10 Units into the skin daily. -     CMP14+EGFR  Primary hypertension -     CMP14+EGFR Continue all antihypertensives as prescribed.  Reminded to bring in blood pressure log for follow  up appointment.  RECOMMENDATIONS: DASH/Mediterranean Diets are healthier choices for HTN.    Hypercholesterolemia -     Lipid panel      Patient has been counseled on age-appropriate routine health concerns for screening and prevention. These are reviewed and up-to-date. Referrals have been placed accordingly. Immunizations are up-to-date or declined.    Subjective:   Chief Complaint  Patient presents with   Diabetes    [name] 52 y.o. female presents to office today for follow up to HTN and DM  Patient has been counseled on age-appropriate routine health concerns for screening and prevention. These are reviewed and up-to-date. Referrals have been placed accordingly. Immunizations are up-to-date or declined.     MAMMOGRAM: OVERDUE. Declined PAP SMEAR: OVERDUE. Declined Colon cancer screening: OVERDUE. Declined  She has a past medical history of CAD, ICM, Carotid artery disease, DM2, H/O medication noncompliance, Hyperlipidemia, Hypertension, MI (Followed by Cardiology), 3v CAD (CABG 2016) PEA arrest, and Tobacco abuse.     HTN Blood pressure is well controlled.  She is currently taking amlodipine 5 mg daily,  carvedilol 12.5 mg twice daily, entresto 49-51 mg BID. [name] recently started by cardiology  BP Readings from Last 3 Encounters:  10/30/22 118/77  10/09/22 132/85  09/04/22 138/68     DM 2 A1c increased to 8.7 from 7.6. Blood sugars at home "up and down". She has been trying to eat  healthier (greek yogurt) but adding fruit. Working 2 jobs. Average home blood glucose readings: 150-200s. She is currently taking Trulicity 1.5mg  weekly, farxiga 10 mg daily and metformin 1000 mg BID.  Lab Results  Component Value Date   HGBA1C 8.7 (A) 10/30/2022    Lab Results  Component Value Date   HGBA1C 7.6 (H) 07/24/2022     Review of Systems  Constitutional:  Negative for fever, malaise/fatigue and weight loss.  HENT: Negative.  Negative for nosebleeds.   Eyes: Negative.  Negative for blurred vision, double vision and photophobia.  Respiratory: Negative.  Negative for cough and shortness of breath.   Cardiovascular: Negative.  Negative for chest pain, palpitations and leg swelling.  Gastrointestinal: Negative.  Negative for heartburn, nausea and vomiting.  Musculoskeletal: Negative.  Negative for myalgias.  Neurological: Negative.  Negative for dizziness, focal weakness, seizures and headaches.  Psychiatric/Behavioral: Negative.  Negative for suicidal ideas.     Past Medical History:  Diagnosis Date   CAD (coronary artery disease)    cath 09/07/2014 DES to 95% prox RCA, residual 70-80% origin of AV groove, 90% ostial PDA stenosis; 01/2015, NSTEMI w/ CABG, LIMA-LAD, SVG-OM1, SVG-PCA   Carotid artery disease (HCC)    a. 40-59% BICA by duplex 01/2015.   Diabetes mellitus without complication (HCC)    H/O medication noncompliance    Hyperlipidemia    Hypertension    MI (myocardial infarction) (HCC)    Tobacco abuse     Past Surgical History:  Procedure Laterality Date   ABDOMINAL HYSTERECTOMY  2008  ANTERIOR CERVICAL DECOMP/DISCECTOMY FUSION N/A 08/26/2021   Procedure: Anterior Cervical Decompression/Discectomy Fusion Cervical Six-Seven;  Surgeon: Lisbeth Renshaw, MD;  Location: [facility] OR;  Service: Neurosurgery;  Laterality: N/A;  3C   CARDIAC CATHETERIZATION N/A 02/01/2015   Procedure: Left Heart Cath and Coronary Angiography;  Surgeon: Runell Gess, MD;  Location: [facility]  INVASIVE CV LAB;  Service: Cardiovascular;  Laterality: N/A;   CARDIAC CATHETERIZATION N/A 02/07/2015   Procedure: IABP Insertion;  Surgeon: Dolores Patty, MD;  Location: [facility] INVASIVE CV LAB;  Service: Cardiovascular;  Laterality: N/A;   CORONARY ARTERY BYPASS GRAFT N/A 02/07/2015   Procedure: CORONARY ARTERY BYPASS GRAFTING (CABG);  Surgeon: Kerin Perna, MD; LIMA-LAD, SVT-OM1, SVG-PDA   LEFT HEART CATHETERIZATION WITH CORONARY ANGIOGRAM N/A 05/29/2014   Procedure: LEFT HEART CATHETERIZATION WITH CORONARY ANGIOGRAM;  Surgeon: Corky Crafts, MD;  Location: [facility] CATH LAB;  Service: Cardiovascular;  Laterality: N/A;   LEFT HEART CATHETERIZATION WITH CORONARY ANGIOGRAM N/A 09/07/2014   Procedure: LEFT HEART CATHETERIZATION WITH CORONARY ANGIOGRAM;  Surgeon: Marykay Lex, MD;  Location: [facility] CATH LAB;  Service: Cardiovascular;  Laterality: N/A;   PERCUTANEOUS CORONARY STENT INTERVENTION (PCI-S)  05/29/2014   Procedure: PERCUTANEOUS CORONARY STENT INTERVENTION (PCI-S);  Surgeon: Corky Crafts, MD;  Location: [facility] CATH LAB;  Service: Cardiovascular;;   TEE WITHOUT CARDIOVERSION N/A 02/07/2015   Procedure: TRANSESOPHAGEAL ECHOCARDIOGRAM (TEE);  Surgeon: Kerin Perna, MD;  Location: [facility] OR;  Service: Open Heart Surgery;  Laterality: N/A;    Family History  Problem Relation Age of Onset   Hypertension Mother    Kidney failure Mother    Stroke Mother 81   Heart attack Father 55   Heart disease Father     Social History Reviewed with no changes to be made today.   Outpatient Medications Prior to Visit  Medication Sig Dispense Refill   acetaminophen (TYLENOL) 500 MG tablet Take 500 mg by mouth every 6 (six) hours as needed for moderate pain.     amLODipine (NORVASC) 5 MG tablet Take 1 tablet (5 mg total) by mouth daily. 30 tablet 11   aspirin EC 81 MG tablet Take 1 tablet (81 mg total) by mouth daily. 30 tablet 11   atorvastatin (LIPITOR) 40 MG tablet Take 1 tablet (40 mg total)  by mouth daily. 90 tablet 3   carvedilol (COREG) 12.5 MG tablet Take 1 tablet (12.5 mg total) by mouth 2 (two) times daily with a meal. 180 tablet 3   dapagliflozin propanediol (FARXIGA) 10 MG TABS tablet Take 1 tablet (10 mg total) by mouth daily before breakfast. 90 tablet 3   metFORMIN (GLUCOPHAGE) 500 MG tablet Take 2 tablets (1,000 mg total) by mouth 2 (two) times daily with a meal. 360 tablet 0   nitroGLYCERIN (NITROSTAT) 0.4 MG SL tablet Place 1 tablet (0.4 mg total) under the tongue every 5 (five) minutes as needed for chest pain. 25 tablet 3   Omega-3 Fatty Acids (FISH OIL) 1200 MG CAPS Take 1,200 mg by mouth at bedtime.     pantoprazole (PROTONIX) 40 MG tablet Take 1 tablet (40 mg total) by mouth daily. 90 tablet 1   sacubitril-valsartan (ENTRESTO) 49-51 MG Take 1 tablet by mouth 2 (two) times daily. (Stop lisinopril 2 days prior to first dose) 60 tablet 6   TRULICITY 1.5 MG/0.5ML SOPN Inject 2 mLs into the skin once a week.     insulin glargine (LANTUS) 100 UNIT/ML injection Inject 30 Units into the skin at bedtime.  Phenylephrine-APAP-guaiFENesin (TYLENOL SINUS SEVERE) 5-325-200 MG TABS Take 2 tablets by mouth daily as needed (congestion).     dapagliflozin propanediol (FARXIGA) 10 MG TABS tablet Take 1 tablet (10 mg total) by mouth daily before breakfast. 28 tablet 0   No facility-administered medications prior to visit.    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  Constitutional:       Appearance: She is well-developed.  HENT:     Head: Normocephalic and atraumatic.  Cardiovascular:     Rate and Rhythm: Normal rate and regular rhythm.     Heart sounds: Normal heart sounds. No murmur heard.    No friction rub. No gallop.  Pulmonary:     Effort: Pulmonary effort is normal. No tachypnea or respiratory distress.     Breath sounds: Normal breath sounds. No decreased breath sounds, wheezing, rhonchi or rales.  Chest:     Chest wall: No tenderness.  Abdominal:     General: Bowel sounds are normal.     Palpations: Abdomen is soft.  Musculoskeletal:        General: Normal range of motion.     Cervical back: Normal range of motion.  Skin:    General: Skin is warm and [name].  Neurological:     Mental Status: She is alert and oriented to person, place, and time.     Coordination: Coordination normal.  Psychiatric:        Behavior: Behavior normal. Behavior is cooperative.        Thought Content: Thought content normal.        Judgment: Judgment normal.          Patient has been counseled extensively about nutrition and exercise as well as the importance of adherence with medications and regular follow-up. The patient was given clear instructions to go to ER or return to medical center if symptoms don't improve, worsen or new problems develop. The patient verbalized understanding.   Follow-up: Return for 6 weeks meter check luke. See me after 01-30-2023.   Claiborne Rigg, FNP-BC [facility] [place], [place] [phone]   10/30/2022, 9:30 AM

## 2022-10-31 LAB — CMP14+EGFR
ALT: 21 IU/L (ref 0–32)
AST: 17 IU/L (ref 0–40)
Albumin: 4.2 g/dL (ref 3.9–4.9)
Alkaline Phosphatase: 78 IU/L (ref 44–121)
BUN/Creatinine Ratio: 16 (ref 9–23)
BUN: 14 mg/dL (ref 6–24)
Bilirubin Total: 0.2 mg/dL (ref 0.0–1.2)
CO2: 20 mmol/L (ref 20–29)
Calcium: 9.6 mg/dL (ref 8.7–10.2)
Chloride: 103 mmol/L (ref 96–106)
Creatinine, Ser: 0.86 mg/dL (ref 0.57–1.00)
Globulin, Total: 2.2 g/dL (ref 1.5–4.5)
Glucose: 203 mg/dL — ABNORMAL HIGH (ref 70–99)
Potassium: 4.6 mmol/L (ref 3.5–5.2)
Sodium: 139 mmol/L (ref 134–144)
Total Protein: 6.4 g/dL (ref 6.0–8.5)
eGFR: 82 mL/min/{1.73_m2} (ref >59)

## 2022-10-31 LAB — LIPID PANEL
Chol/HDL Ratio: 2.6 ratio (ref 0.0–4.4)
Cholesterol, Total: 96 mg/dL — ABNORMAL LOW (ref 100–199)
HDL: 37 mg/dL — ABNORMAL LOW (ref >39)
LDL Chol Calc (NIH): 36 mg/dL (ref 0–99)
Triglycerides: 128 mg/dL (ref 0–149)
VLDL Cholesterol Cal: 23 mg/dL (ref 5–40)

## 2022-11-04 ENCOUNTER — Ambulatory Visit: Payer: BLUE CROSS/BLUE SHIELD | Attending: Nurse Practitioner

## 2022-11-04 ENCOUNTER — Encounter: Payer: Self-pay | Admitting: Cardiovascular Disease

## 2022-11-04 NOTE — Progress Notes (Deleted)
Patient ID: HADEEL [name]                 DOB: 15-Aug-1970                      MRN: [id number]     HPI: [name] is a 52 y.o. female referred by Dr. Allyson Sabal to pharmacy clinic for HF medication management. PMH is significant for CAD, smoking,  NSTEMI, angina, CHF, T2DM, and HLD. Most recent LVEF 30-35% on 10/02/22.  Discussion with patient today included the following: cardiac medication indications, review of GDMT including reasoning behind medication titration, importance of medication adherence, and patient engagement. ***Denies dizziness, fatigue, LE edema, or SOB. Able to complete all ADLs. *** checks daily weights at home. *** adheres to a low-sodium diet.  Current CHF meds:  Carvedilol 12.5mg  BID Farxiga 10mg  daily Entresto 49-51mg  BID  Also on amlodipine 5mg  daily  Previously tried:  BP goal:   Family History:   Social History:   Diet:   Exercise:   Home BP readings:   Wt Readings from Last 3 Encounters:  10/30/22 172 lb 6.4 oz (78.2 kg)  10/09/22 187 lb (84.8 kg)  09/04/22 179 lb 3.2 oz (81.3 kg)   BP Readings from Last 3 Encounters:  10/30/22 118/77  10/09/22 132/85  09/04/22 138/68   Pulse Readings from Last 3 Encounters:  10/30/22 83  10/09/22 76  09/04/22 70    Renal function: Estimated Creatinine Clearance: 77.5 mL/min (by C-G formula based on SCr of 0.86 mg/dL).  Past Medical History:  Diagnosis Date   CAD (coronary artery disease)    cath 09/07/2014 DES to 95% prox RCA, residual 70-80% origin of AV groove, 90% ostial PDA stenosis; 01/2015, NSTEMI w/ CABG, LIMA-LAD, SVG-OM1, SVG-PCA   Carotid artery disease (HCC)    a. 40-59% BICA by duplex 01/2015.   Diabetes mellitus without complication (HCC)    H/O medication noncompliance    Hyperlipidemia    Hypertension    MI (myocardial infarction) (HCC)    Tobacco abuse     Current Outpatient Medications on File Prior to Visit  Medication Sig Dispense Refill   acetaminophen (TYLENOL) 500  MG tablet Take 500 mg by mouth every 6 (six) hours as needed for moderate pain.     amLODipine (NORVASC) 5 MG tablet Take 1 tablet (5 mg total) by mouth daily. 30 tablet 11   aspirin EC 81 MG tablet Take 1 tablet (81 mg total) by mouth daily. 30 tablet 11   atorvastatin (LIPITOR) 40 MG tablet Take 1 tablet (40 mg total) by mouth daily. 90 tablet 3   carvedilol (COREG) 12.5 MG tablet Take 1 tablet (12.5 mg total) by mouth 2 (two) times daily with a meal. 180 tablet 3   dapagliflozin propanediol (FARXIGA) 10 MG TABS tablet Take 1 tablet (10 mg total) by mouth daily before breakfast. 90 tablet 3   insulin glargine (LANTUS SOLOSTAR) 100 UNIT/ML Solostar Pen Inject 10 Units into the skin daily. 15 mL PRN   metFORMIN (GLUCOPHAGE) 500 MG tablet Take 2 tablets (1,000 mg total) by mouth 2 (two) times daily with a meal. 360 tablet 0   nitroGLYCERIN (NITROSTAT) 0.4 MG SL tablet Place 1 tablet (0.4 mg total) under the tongue every 5 (five) minutes as needed for chest pain. 25 tablet 3   Omega-3 Fatty Acids (FISH OIL) 1200 MG CAPS Take 1,200 mg by mouth at bedtime.     pantoprazole (PROTONIX) 40 MG  tablet Take 1 tablet (40 mg total) by mouth daily. 90 tablet 1   sacubitril-valsartan (ENTRESTO) 49-51 MG Take 1 tablet by mouth 2 (two) times daily. (Stop lisinopril 2 days prior to first dose) 60 tablet 6   TRULICITY 1.5 MG/0.5ML SOPN Inject 2 mLs into the skin once a week.     No current facility-administered medications on file prior to visit.    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 CHF -

## 2022-12-11 ENCOUNTER — Ambulatory Visit: Payer: BLUE CROSS/BLUE SHIELD | Admitting: Pharmacist

## 2022-12-14 ENCOUNTER — Ambulatory Visit: Payer: BLUE CROSS/BLUE SHIELD | Attending: Family Medicine | Admitting: Pharmacist

## 2022-12-14 ENCOUNTER — Encounter: Payer: Self-pay | Admitting: Pharmacist

## 2022-12-14 ENCOUNTER — Telehealth: Payer: Self-pay | Admitting: Pharmacist

## 2022-12-14 ENCOUNTER — Other Ambulatory Visit: Payer: Self-pay

## 2022-12-14 VITALS — BP 125/80 | HR 74

## 2022-12-14 DIAGNOSIS — E118 Type 2 diabetes mellitus with unspecified complications: Secondary | ICD-10-CM | POA: Diagnosis not present

## 2022-12-14 DIAGNOSIS — Z794 Long term (current) use of insulin: Secondary | ICD-10-CM | POA: Diagnosis not present

## 2022-12-14 DIAGNOSIS — Z7984 Long term (current) use of oral hypoglycemic drugs: Secondary | ICD-10-CM

## 2022-12-14 DIAGNOSIS — Z7985 Long-term (current) use of injectable non-insulin antidiabetic drugs: Secondary | ICD-10-CM

## 2022-12-14 MED ORDER — FREESTYLE LIBRE 2 SENSOR MISC
6 refills | Status: AC
Start: 2022-12-14 — End: ?
  Filled 2022-12-14: qty 2, 28d supply, fill #0

## 2022-12-14 MED ORDER — FREESTYLE LIBRE 2 READER DEVI
0 refills | Status: DC
Start: 2022-12-14 — End: 2022-12-15
  Filled 2022-12-14: qty 1, 90d supply, fill #0

## 2022-12-14 NOTE — Progress Notes (Signed)
    S:     No chief complaint on file.  52 y.o. female who presents for diabetes evaluation, education, and management. Patient arrives in good spirits and presents without any assistance. Patient was referred and last seen by Primary Care Provider, Bertram Denver, on 10/30/2022.   Patient reports Diabetes was diagnosed initially as GM in 1999. Never been hospitalized for DKA. Insulin started soon after diagnosis. She has never been diagnosed with pancreatitis. No personal or fhx of thyroid cancer. NSTEMI in 2016 S/p CABG x3. No stroke, no CKD. Does have a hx of CHF/ischemic cardiomyopathy resulting from NSTEMI in 2016 - last EF 30-35% in 09/2022.   Family/Social History:  Fhx: HTN, kidney failure, stroke Tobacco: former smoker (quit 1994) Alcohol: none  Current diabetes medications include: Farxiga 10 mg daily, metformin 500 mg - takes 2 tablets BID, Lantus 10 u daily, Trulicity 1.5 mg weekly   Patient reports adherence to taking all medications as prescribed.   Insurance coverage: BCBS  Patient denies hypoglycemic events.  Reported home blood sugars: checks infrequently.  Patient denies polyuria, polydipsia Patient reports neuropathy (nerve pain). Patient denies visual changes. Patient reports self foot exams.   Patient reported dietary habits: -1-2x meals daily  -Lunch: yogurt, fruit  -Dinner: pasta, casserole   Patient-reported exercise habits: none outside of work. Active at work   O:   Lab Results  Component Value Date   HGBA1C 8.7 (A) 10/30/2022   Vitals:   12/14/22 1411  BP: 125/80  Pulse: 74    Lipid Panel     Component Value Date/Time   CHOL 96 (L) 10/30/2022 0935   TRIG 128 10/30/2022 0935   HDL 37 (L) 10/30/2022 0935   CHOLHDL 2.6 10/30/2022 0935   CHOLHDL 3.0 02/03/2015 0235   VLDL 18 02/03/2015 0235   LDLCALC 36 10/30/2022 0935    Clinical Atherosclerotic Cardiovascular Disease (ASCVD): Yes  The ASCVD Risk score (Arnett DK, et al., 2019) failed  to calculate for the following reasons:   The patient has a prior MI or stroke diagnosis   Patient is participating in a Managed Medicaid Plan: no   A/P: Diabetes longstanding currently uncontrolled based on A1c. Cannot recommend changes without much home data. Will try to get her approved for [place]. Patient is able to verbalize appropriate hypoglycemia management plan. Medication adherence appears appropriate. -Continued current DM regimen.  -Rxns sent for [facility] 2 system. Pt would perfer to use receiver and not her phone.  -Extensively discussed pathophysiology of diabetes, recommended lifestyle interventions, dietary effects on blood sugar control.  -Counseled on s/sx of and management of hypoglycemia.  -Next A1c anticipated 01/2023.   Hypertension longstanding currently at goal. Blood pressure goal of <130/80 mmHg. Medication adherence reported. -Continued her current regimen.  Written patient instructions provided. Patient verbalized understanding of treatment plan.  Total time in face to face counseling 30 minutes.    Follow-up:  Pharmacist in 1 month  Butch Penny, PharmD, [place], CPP Clinical Pharmacist [facility] [phone]

## 2022-12-14 NOTE — Telephone Encounter (Signed)
Hey friend,   Can we start a PA for this patient's libre rxn? She is on Trulicity and Lantus.

## 2022-12-15 ENCOUNTER — Other Ambulatory Visit: Payer: Self-pay

## 2022-12-15 MED ORDER — DEXCOM G7 RECEIVER DEVI
0 refills | Status: DC
  Filled 2022-12-15: qty 1, 90d supply, fill #0

## 2022-12-15 MED ORDER — DEXCOM G7 SENSOR MISC
6 refills | Status: DC
  Filled 2022-12-15: qty 3, 30d supply, fill #0
  Filled 2023-01-21: qty 3, 30d supply, fill #1
  Filled 2023-02-19: qty 3, 30d supply, fill #2
  Filled 2023-03-25: qty 3, 30d supply, fill #3
  Filled 2023-04-22 – 2023-04-23 (×2): qty 3, 30d supply, fill #4
  Filled 2023-05-21: qty 3, 30d supply, fill #5
  Filled 2023-06-22 – 2023-09-09 (×4): qty 3, 30d supply, fill #6

## 2022-12-15 NOTE — Telephone Encounter (Signed)
Yes ma'am, rxn for Dexcom sent.

## 2022-12-15 NOTE — Addendum Note (Signed)
Addended by: Lois Huxley, Jeannett Senior L on: 12/15/2022 04:05 PM   Modules accepted: Orders

## 2022-12-16 ENCOUNTER — Other Ambulatory Visit: Payer: Self-pay | Admitting: Pharmacist

## 2022-12-16 ENCOUNTER — Other Ambulatory Visit: Payer: Self-pay

## 2022-12-16 MED ORDER — OMEPRAZOLE 40 MG PO CPDR: 40.0000 mg | DELAYED_RELEASE_CAPSULE | Freq: Every day | ORAL | 1 refills | Status: DC

## 2022-12-17 ENCOUNTER — Ambulatory Visit: Payer: BLUE CROSS/BLUE SHIELD

## 2022-12-18 ENCOUNTER — Other Ambulatory Visit: Payer: Self-pay

## 2022-12-22 ENCOUNTER — Other Ambulatory Visit: Payer: Self-pay

## 2022-12-23 ENCOUNTER — Telehealth: Payer: Self-pay | Admitting: Nurse Practitioner

## 2022-12-23 NOTE — Telephone Encounter (Signed)
Copied from CRM [id number]. Topic: General - Other >> Dec 23, 2022  2:53 PM Franchot Heidelberg wrote: Reason for CRM: Pt called requesting to speak to [facility] about her glucose monitor, she has questions Best contact: [phone]

## 2022-12-25 NOTE — Telephone Encounter (Signed)
Patient needed help with entering the correct code to sync her sensor and receiver. All questions and concerns addressed.

## 2022-12-27 ENCOUNTER — Other Ambulatory Visit: Payer: Self-pay | Admitting: Family Medicine

## 2022-12-27 DIAGNOSIS — E118 Type 2 diabetes mellitus with unspecified complications: Secondary | ICD-10-CM

## 2022-12-29 NOTE — Telephone Encounter (Signed)
Requested medication (s) are due for refill today - yes  Requested medication (s) are on the active medication list -yes  Future visit scheduled -yes  Last refill: 08/18/22  Notes to clinic: medication listed as historical medication - sent for review of request  Requested Prescriptions  Pending Prescriptions Disp Refills   TRULICITY 1.5 MG/0.5ML SOPN [Pharmacy Med Name: TRULICITY 1.5 MG/0.5 ML PEN]  2    Sig: INJECT 1.5 MG INTO THE SKIN ONCE A WEEK.     Endocrinology:  Diabetes - GLP-1 Receptor Agonists Failed - 12/27/2022  2:19 PM      Failed - HBA1C is between 0 and 7.9 and within 180 days    HbA1c, POC (controlled diabetic range)  Date Value Ref Range Status  10/30/2022 8.7 (A) 0.0 - 7.0 % Final         Passed - Valid encounter within last 6 months    Recent Outpatient Visits           2 weeks ago Diabetes mellitus type 2 with complications [facility])   [place] [facility] [place], [place] L, RPH-CPP   2 months ago Diabetes mellitus type 2 with complications [facility])   [place] [facility] [place], [place] W, NP   5 months ago Diabetes mellitus type 2 with complications [facility])   [place] [facility] Claiborne Rigg, NP       Future Appointments             In 3 days  [facility]   In 2 weeks Lois Huxley, Cornelius Moras, RPH-CPP [place] [facility]   In 1 month Claiborne Rigg, NP [facility]               Requested Prescriptions  Pending Prescriptions Disp Refills   TRULICITY 1.5 MG/0.5ML SOPN [Pharmacy Med Name: TRULICITY 1.5 MG/0.5 ML PEN]  2    Sig: INJECT 1.5 MG INTO THE SKIN ONCE A WEEK.     Endocrinology:  Diabetes - GLP-1 Receptor Agonists Failed - 12/27/2022  2:19 PM      Failed - HBA1C is between 0 and 7.9 and within 180 days    HbA1c, POC (controlled diabetic range)  Date Value Ref Range  Status  10/30/2022 8.7 (A) 0.0 - 7.0 % Final         Passed - Valid encounter within last 6 months    Recent Outpatient Visits           2 weeks ago Diabetes mellitus type 2 with complications [facility])   [place] [facility] [place], [place] L, RPH-CPP   2 months ago Diabetes mellitus type 2 with complications [facility])   [place] [facility] [place], [place] W, NP   5 months ago Diabetes mellitus type 2 with complications [facility])   [place] [facility] [place], Shea Stakes, NP       Future Appointments             In 3 days  [place] [facility]   In 2 weeks Lois Huxley, Cornelius Moras, RPH-CPP  [facility]   In 1 month Claiborne Rigg, NP [facility]

## 2023-01-01 ENCOUNTER — Encounter: Payer: Self-pay | Admitting: Student

## 2023-01-01 ENCOUNTER — Ambulatory Visit: Payer: BLUE CROSS/BLUE SHIELD | Attending: Cardiovascular Disease | Admitting: Student

## 2023-01-01 VITALS — BP 115/74 | HR 82

## 2023-01-01 DIAGNOSIS — I255 Ischemic cardiomyopathy: Secondary | ICD-10-CM | POA: Diagnosis not present

## 2023-01-01 MED ORDER — SPIRONOLACTONE 25 MG PO TABS
12.5000 mg | ORAL_TABLET | Freq: Every day | ORAL | 3 refills | Status: DC
Start: 2023-01-01 — End: 2023-08-16

## 2023-01-01 NOTE — Assessment & Plan Note (Signed)
Assessment: BP in office today is 115/74 Patient with HFrEF Tolerates current medications without any side effects - Entresto 49-51 mg twice daily, carvedilol 12.5 mg twice daily and Farxiga 10 mg daily  Denies SOB, palpitation, chest pain, headaches,or edema No concerns with regards to cost of medications, compliance, ability to pick up at pharmacy Reiterated the importance of regular exercise and low salt diet  Plan: GDMT ACEI/ARB/ARNI [x] Yes [] No Entresto 49-51 mg twice daily   Beta blocker [x] Yes [] No Carvedilol 12.5 twice daily   MRA [] Yes [x] No Last K at 5.3 (07/24/22) WNL 4.3 on 10/30/2022 will start  spironolactone 12.5 mg daily   SGLT2 inhibitor [x] Yes [] No Farxiga 10 mg daily    Labs orderd:  BMP in 2 weeks  Follow up with PharmD in 4 weeks for further titrations

## 2023-01-01 NOTE — Patient Instructions (Signed)
Start taking spironolactone 12.5 mg every morning. Drink enough water through out the day. Check BP at home and bring log at your next appointment

## 2023-01-01 NOTE — Progress Notes (Unsigned)
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 female patient of Dr Allyson Sabal, in the office today for management of GDMT for HFrEF.  Patient had an echocardiogram recently that showed her EF had dropped to 30-35%.    Blood Pressure Goal:  130/80  GDMT: ACEI/ARB/ARNI [x] Yes [] No Entresto 49-51 mg twice daily   Beta blocker [x] Yes [] No Carvedilol 12.5 twice daily   MRA [] Yes [x] No Last K at 5.3 (07/24/22) WNL 4.3 on 10/30/2022   SGLT2 inhibitor [x] Yes [] No Farxiga 10 mg daily     Family Hx:  father died from MI, stroke at 9; mother had stroke at 77, then CKD died at 47; older sister with first MI last year at 9; both sisters with hypertension, other sister with DM; ; 2 children 71, 96 - older with new dm diagnosis    Social Hx:      Tobacco: no  Alcohol: no  Caffeine: coffee daily  Diet:  mostly home cooked meals; does use salt with cooking; variety of meats (not fish), vegetables usually frozen; doesn't snack much  Exercise: 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 [] Yes [x] No  Do you ever skip doses due to side effects? [] Yes [x] No  Do you have trouble affording your medicines?  [] Yes [x] No  Are you ever unable to pick up your medication due to transportation difficulties? [] Yes [x] No  Do you ever stop taking your medications because you don't believe they are helping? [] Yes [x] No    Accessory Clinical Findings    Lab Results  Component Value Date   CREATININE 0.86 10/30/2022   BUN 14 10/30/2022   NA 139 10/30/2022   K 4.6 10/30/2022   CL 103 10/30/2022   CO2 20 10/30/2022   Lab Results  Component Value Date   ALT 21 10/30/2022   AST 17 10/30/2022   ALKPHOS 78 10/30/2022   BILITOT 0.2 10/30/2022   Lab Results  Component Value Date   HGBA1C 8.7 (A) 10/30/2022    Home Medications/Allergies    Current Outpatient Medications  Medication Sig Dispense Refill   spironolactone (ALDACTONE) 25 MG tablet Take 0.5 tablets (12.5 mg total) by mouth daily. 45 tablet 3   acetaminophen (TYLENOL) 500 MG tablet Take 500 mg by mouth every 6 (six) hours as needed for moderate pain.     amLODipine (NORVASC) 5 MG tablet Take 1 tablet (5 mg total) by mouth daily. 30 tablet 11   aspirin EC 81 MG tablet Take 1 tablet (81 mg total) by mouth daily. 30 tablet 11   atorvastatin (LIPITOR) 40 MG  tablet Take 1 tablet (40 mg total) by mouth daily. 90 tablet 3   carvedilol (COREG) 12.5 MG tablet Take 1 tablet (12.5 mg total) by mouth 2 (two) times daily with a meal. 180 tablet 3   Continuous Glucose Receiver (DEXCOM G7 RECEIVER) DEVI Use to check blood sugar continuously throughout the day. Change sensors once every 10 days. 1 each 0   Continuous Glucose Sensor (DEXCOM G7 SENSOR) MISC Use to check blood sugar continuously throughout the day. Change sensors once every 10 days. 3 each 6   dapagliflozin propanediol (FARXIGA) 10 MG TABS tablet Take 1 tablet (10 mg total) by mouth daily before breakfast. 90 tablet 3   Dulaglutide (TRULICITY) 1.5 MG/0.5ML SOPN INJECT 1.5 MG INTO THE SKIN ONCE A WEEK. 6 mL 0   insulin glargine (LANTUS SOLOSTAR) 100 UNIT/ML Solostar Pen Inject 10 Units  into the skin daily. 15 mL PRN   metFORMIN (GLUCOPHAGE) 500 MG tablet Take 2 tablets (1,000 mg total) by mouth 2 (two) times daily with a meal. 360 tablet 0   nitroGLYCERIN (NITROSTAT) 0.4 MG SL tablet Place 1 tablet (0.4 mg total) under the tongue every 5 (five) minutes as needed for chest pain. 25 tablet 3   Omega-3 Fatty Acids (FISH OIL) 1200 MG CAPS Take 1,200 mg by mouth at bedtime.     omeprazole (PRILOSEC) 40 MG capsule Take 1 capsule (40 mg total) by mouth daily. 90 capsule 1   sacubitril-valsartan (ENTRESTO) 49-51 MG Take 1 tablet by mouth 2 (two) times daily. (Stop lisinopril 2 days prior to first dose) 60 tablet 6   No current facility-administered medications for this visit.     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 - Patient should NOT receive heparin (02/10/15 and 02/12/15)     Assessment & Plan      Ischemic cardiomyopathy Assessment: BP in office today is 115/74 Patient with HFrEF Tolerates current medications without any side effects - Entresto 49-51 mg twice daily, carvedilol 12.5 mg twice daily and Farxiga 10 mg daily  Denies SOB, palpitation, chest pain, headaches,or edema No concerns with regards to cost of medications, compliance, ability to pick up at pharmacy Reiterated the importance of regular exercise and low salt diet  Plan: GDMT ACEI/ARB/ARNI [x] Yes [] No Entresto 49-51 mg twice daily   Beta blocker [x] Yes [] No Carvedilol 12.5 twice daily   MRA [] Yes [x] No Last K at 5.3 (07/24/22) WNL 4.3 on 10/30/2022 will start  spironolactone 12.5 mg daily   SGLT2 inhibitor [x] Yes [] No Farxiga 10 mg daily    Labs orderd:  BMP in 2 weeks  Follow up with PharmD in 4 weeks for further titrations   Carmela Hurt, Pharm.D [facility] Division of [facility] [address], [place], [place] [address]  Phone: [phone]; Fax: [phone]

## 2023-01-15 ENCOUNTER — Encounter: Payer: Self-pay | Admitting: Pharmacist

## 2023-01-15 ENCOUNTER — Ambulatory Visit: Payer: BLUE CROSS/BLUE SHIELD | Attending: Nurse Practitioner | Admitting: Pharmacist

## 2023-01-15 ENCOUNTER — Other Ambulatory Visit: Payer: Self-pay

## 2023-01-15 ENCOUNTER — Telehealth: Payer: Self-pay | Admitting: Pharmacist

## 2023-01-15 DIAGNOSIS — Z7984 Long term (current) use of oral hypoglycemic drugs: Secondary | ICD-10-CM

## 2023-01-15 DIAGNOSIS — E118 Type 2 diabetes mellitus with unspecified complications: Secondary | ICD-10-CM

## 2023-01-15 DIAGNOSIS — Z794 Long term (current) use of insulin: Secondary | ICD-10-CM | POA: Diagnosis not present

## 2023-01-15 DIAGNOSIS — Z7985 Long-term (current) use of injectable non-insulin antidiabetic drugs: Secondary | ICD-10-CM

## 2023-01-15 LAB — POCT GLYCOSYLATED HEMOGLOBIN (HGB A1C): HbA1c, POC (controlled diabetic range): 7.3 % — AB (ref 0.0–7.0)

## 2023-01-15 MED ORDER — OZEMPIC (0.25 OR 0.5 MG/DOSE) 2 MG/3ML ~~LOC~~ SOPN
PEN_INJECTOR | SUBCUTANEOUS | 2 refills | Status: DC
  Filled 2023-01-15 – 2023-01-22 (×2): qty 3, 42d supply, fill #0

## 2023-01-15 NOTE — Telephone Encounter (Signed)
Can we attempt a PA on Ozempic? Patient has now tried and failed Trulicity and Victoza.

## 2023-01-15 NOTE — Progress Notes (Signed)
    S:     No chief complaint on file.  52 y.o. female who presents for diabetes evaluation, education, and management. Patient arrives in good spirits and presents without any assistance. Patient was referred and last seen by Primary Care Provider, Bertram Denver, on 10/30/2022. I saw her on 12/14/22 and got CGM coverage for her. Since her last visit, she has been doing well! Denies any complaints today other than her Trulicity copay. She has not taken in 3 weeks d/t this.   Family/Social History:  Fhx: HTN, kidney failure, stroke Tobacco: former smoker (quit 1994) Alcohol: none  Current diabetes medications include:  - Farxiga 10 mg daily - Metformin 500 mg - takes 2 tablets BID - Lantus 10 u daily (self doses based on numbers; gives range of 15-20u depending) - Trulicity 1.5 mg weekly (not taking - 3 weeks ago - $50 for the Trulicity)  Patient reports adherence to [place], metformin, and Lantus. Has not been taking Trulicity d/t cost.  Insurance coverage: BCBS, Medicaid ([employer])  Patient denies hypoglycemic events.  Reported home blood sugars: checks infrequently.  Patient denies polyuria, polydipsia Patient reports neuropathy (nerve pain). No changes. Patient denies visual changes. Patient reports self foot exams.   Patient reported dietary habits: -1-2x meals daily. Finds that she eats more if she's not busy.  -Lunch: yogurt, fruit  -Dinner: pasta, casserole   Patient-reported exercise habits:  -None outside of work. Active at work   O:  Dexcom G7:  GMI: 6.3% Time in range: 82% Avg: 126 mg/dL  Lab Results  Component Value Date   HGBA1C 7.3 (A) 01/15/2023   There were no vitals filed for this visit.   Lipid Panel     Component Value Date/Time   CHOL 96 (L) 10/30/2022 0935   TRIG 128 10/30/2022 0935   HDL 37 (L) 10/30/2022 0935   CHOLHDL 2.6 10/30/2022 0935   CHOLHDL 3.0 02/03/2015 0235   VLDL 18 02/03/2015 0235   LDLCALC 36 10/30/2022 0935     Clinical Atherosclerotic Cardiovascular Disease (ASCVD): Yes  The ASCVD Risk score (Arnett DK, et al., 2019) failed to calculate for the following reasons:   The patient has a prior MI or stroke diagnosis   Patient is participating in a Managed Medicaid Plan: YES    A/P: Diabetes longstanding currently close to goal based on today's A1c and GMI on G7 report. Commended her for this. Patient is able to verbalize appropriate hypoglycemia management plan. Medication adherence appears appropriate. With her cardiac hx, will change Trulicity to Ozempic and see if we can now get her coverage.  -Change Trulicity to Ozempic. Start 0.25 mg weekly x4 weeks then increase to 0.5 mg weekly thereafter for 4 weeks.  -Continue metformin, Farxiga, and Lantus for now.  -Extensively discussed pathophysiology of diabetes, recommended lifestyle interventions, dietary effects on blood sugar control.  -Counseled on s/sx of and management of hypoglycemia.  -Next A1c anticipated 04/2023.   Written patient instructions provided. Patient verbalized understanding of treatment plan.  Total time in face to face counseling 30 minutes.    Follow-up:  Pharmacist in 6 weeks. PCP 02/05/2023.  Butch Penny, PharmD, Patsy [place], CPP Clinical Pharmacist [facility] [phone]

## 2023-01-18 ENCOUNTER — Other Ambulatory Visit: Payer: Self-pay

## 2023-01-21 ENCOUNTER — Other Ambulatory Visit: Payer: Self-pay

## 2023-01-22 ENCOUNTER — Other Ambulatory Visit: Payer: Self-pay

## 2023-01-27 ENCOUNTER — Other Ambulatory Visit: Payer: Self-pay | Admitting: Nurse Practitioner

## 2023-01-27 DIAGNOSIS — E118 Type 2 diabetes mellitus with unspecified complications: Secondary | ICD-10-CM

## 2023-02-01 ENCOUNTER — Ambulatory Visit: Payer: BLUE CROSS/BLUE SHIELD | Attending: Cardiology | Admitting: Student

## 2023-02-01 ENCOUNTER — Encounter: Payer: Self-pay | Admitting: Student

## 2023-02-01 VITALS — BP 127/79 | HR 76 | Ht 63.0 in | Wt 181.0 lb

## 2023-02-01 DIAGNOSIS — I255 Ischemic cardiomyopathy: Secondary | ICD-10-CM

## 2023-02-01 NOTE — Progress Notes (Signed)
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 female patient of Dr Allyson Sabal, in the office today for management of GDMT for HFrEF.  Patient had an echocardiogram recently that showed her EF had dropped to 30-35%.   Patient presented today with home BP log BP ~ 120-125/76-80 range with HR ~75-80. She has been eating instant noodles she has not had it in while it raised her BP from past couple of days ( 145-135/80 range). Reiterated importance of low salt diet   Blood Pressure Goal:  130/80  GDMT: ACEI/ARB/ARNI [x] Yes [] No Entresto 49-51 mg twice daily   Beta blocker [x] Yes [] No Carvedilol 12.5 twice daily   MRA [] Yes [x] No Last K at 5.3 (07/24/22) WNL 4.3 on 10/30/2022   SGLT2 inhibitor [x] Yes [] No Farxiga 10 mg daily     Family Hx:  father died from MI, stroke at 42; mother had stroke at 57, then CKD died at 32; older sister with first MI last year at 26; both sisters with hypertension, other sister with DM; ; 2 children 73, 23 - older with new dm diagnosis    Social Hx:      Tobacco: no  Alcohol: no  Caffeine: 1 coffee daily  Diet:  mostly home cooked meals; does use salt with cooking; variety of meats (not fish), vegetables usually frozen; doesn't snack much  Exercise: willing to start walking at  least 15 to 30 min per day and start doing some chair exercise   Home BP readings:  120-125/76-80 range with HR ~75-80  Adherence Assessment  Do you ever forget to take your medication? [] Yes [x] No  Do you ever skip doses due to side effects? [] Yes [x] No  Do you have trouble affording your medicines? [] Yes [x] No  Are you ever unable to pick up your medication due to transportation difficulties? [] Yes [x] No  Do you ever stop taking your medications because you don't believe they are helping? [] Yes [x] No    Accessory Clinical Findings    Lab Results  Component Value Date   CREATININE 0.86 10/30/2022   BUN 14 10/30/2022   NA 139 10/30/2022   K 4.6 10/30/2022   CL 103 10/30/2022   CO2 20 10/30/2022   Lab Results  Component Value Date   ALT 21 10/30/2022   AST 17 10/30/2022   ALKPHOS 78 10/30/2022   BILITOT 0.2 10/30/2022   Lab Results  Component Value Date   HGBA1C 7.3 (A) 01/15/2023    Home Medications/Allergies    Current Outpatient Medications  Medication Sig Dispense Refill   acetaminophen (TYLENOL) 500 MG tablet Take 500 mg by mouth every 6 (six) hours as needed for moderate pain.     amLODipine (NORVASC) 5  MG tablet Take 1 tablet (5 mg total) by mouth daily. 30 tablet 11   aspirin EC 81 MG tablet Take 1 tablet (81 mg total) by mouth daily. 30 tablet 11   atorvastatin (LIPITOR) 40 MG tablet Take 1 tablet (40 mg total) by mouth daily. 90 tablet 3   carvedilol (COREG) 12.5 MG tablet Take 1 tablet (12.5 mg total) by mouth 2 (two) times daily with a meal. 180 tablet 3   Continuous Glucose Receiver (DEXCOM G7 RECEIVER) DEVI Use to check blood sugar continuously throughout the day. Change sensors once every 10 days. 1 each 0   Continuous Glucose Sensor (DEXCOM G7 SENSOR) MISC Use to check blood sugar continuously throughout the day. Change sensors once every 10 days. 3 each 6   dapagliflozin propanediol (FARXIGA) 10 MG TABS tablet Take 1 tablet (10 mg total) by mouth  daily before breakfast. 90 tablet 3   insulin glargine (LANTUS SOLOSTAR) 100 UNIT/ML Solostar Pen Inject 10 Units into the skin daily. 15 mL PRN   metFORMIN (GLUCOPHAGE) 500 MG tablet TAKE 2 TABLETS (1,000 MG TOTAL) BY MOUTH 2 (TWO) TIMES DAILY WITH A MEAL. 360 tablet 0   nitroGLYCERIN (NITROSTAT) 0.4 MG SL tablet Place 1 tablet (0.4 mg total) under the tongue every 5 (five) minutes as needed for chest pain. 25 tablet 3   Omega-3 Fatty Acids (FISH OIL) 1200 MG CAPS Take 1,200 mg by mouth at bedtime.     omeprazole (PRILOSEC) 40 MG capsule Take 1 capsule (40 mg total) by mouth daily. 90 capsule 1   sacubitril-valsartan (ENTRESTO) 49-51 MG Take 1 tablet by mouth 2 (two) times daily. (Stop lisinopril 2 days prior to first dose) 60 tablet 6   Semaglutide,0.25 or 0.5MG /DOS, (OZEMPIC, 0.25 OR 0.5 MG/DOSE,) 2 MG/3ML SOPN Inject 0.25 mg into the skin once a week for 4 weeks, then increase to 0.5 mg weekly thereafter. 3 mL 2   spironolactone (ALDACTONE) 25 MG tablet Take 0.5 tablets (12.5 mg total) by mouth daily. 45 tablet 3   No current facility-administered medications for this visit.     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 - Patient should NOT receive heparin (02/10/15 and 02/12/15)     Assessment & Plan      Ischemic cardiomyopathy Assessment: BP in office today 127/79; home BP 120-125/76-80 range with HR ~75-80 Patient with HFrEF Tolerates current medications without any side effects - Entresto 49-51 mg twice daily, carvedilol 12.5 mg twice daily spironolactone 12.5 mg daily and Farxiga 10 mg daily  Denies SOB, palpitation, chest pain, headaches,or edema No concerns with regards to cost of medications, compliance, ability to pick up at pharmacy Reiterated the importance of regular exercise and low salt diet;   Plan: GDMT ACEI/ARB/ARNI [x] Yes [] No Entresto 49-51 mg twice daily   Beta blocker [x] Yes [] No Increase Carvedilol dose from  12.5 twice daily to 25 mg daily   MRA [] Yes [x] No Last K at 5.3 (07/24/22) WNL 4.3 on 10/30/2022 spironolactone 12.5 mg daily follow up BMP lab today   SGLT2 inhibitor [x] Yes [] No Farxiga 10 mg daily   Will stop amlodipine to avoid hypotension as we are increasing carvedilol dose  Labs orderd:  BMP today post MRA initiation  Follow up with PharmD in 4 weeks for further titrations    Carmela Hurt, Pharm.D  [facility] [address] [place], [place] [address]  Phone: [phone]; Fax: [phone]

## 2023-02-01 NOTE — Patient Instructions (Addendum)
Changes made by your pharmacist Carmela Hurt, PharmD at today's visit:    Instructions/Changes  (what do you need to do) Your Notes  (what you did and when you did it)  Increase carvedilol does from 12.5 mg twice daily to 25 mg twice daily Stop taking amlodipine 5 mg daily    Continue taking Farxiga 10 mg daily, spironolactone 12.5 mg daily and Entresto 49-51 mg twice daily     Start doing exercise regularly     Bring all of your meds, your BP cuff and your record of home blood pressures to your next appointment.    HOW TO TAKE YOUR BLOOD PRESSURE AT HOME  Rest 5 minutes before taking your blood pressure.  Don't smoke or drink caffeinated beverages for at least 30 minutes before. Take your blood pressure before (not after) you eat. Sit comfortably with your back supported and both feet on the floor (don't cross your legs). Elevate your arm to heart level on a table or a desk. Use the proper sized cuff. It should fit smoothly and snugly around your bare upper arm. There should be enough room to slip a fingertip under the cuff. The bottom edge of the cuff should be 1 inch above the crease of the elbow. Ideally, take 3 measurements at one sitting and record the average.  Important lifestyle changes to control high blood pressure  Intervention  Effect on the BP  Lose extra pounds and watch your waistline Weight loss is one of the most effective lifestyle changes for controlling blood pressure. If you're overweight or obese, losing even a small amount of weight can help reduce blood pressure. Blood pressure might go down by about 1 millimeter of mercury (mm Hg) with each kilogram (about 2.2 pounds) of weight lost.  Exercise regularly As a general goal, aim for at least 30 minutes of moderate physical activity every day. Regular physical activity can lower high blood pressure by about 5 to 8 mm Hg.  Eat a healthy diet Eating a diet rich in whole grains, fruits, vegetables, and low-fat  dairy products and low in saturated fat and cholesterol. A healthy diet can lower high blood pressure by up to 11 mm Hg.  Reduce salt (sodium) in your diet Even a small reduction of sodium in the diet can improve heart health and reduce high blood pressure by about 5 to 6 mm Hg.  Limit alcohol One drink equals 12 ounces of beer, 5 ounces of wine, or 1.5 ounces of 80-proof liquor.  Limiting alcohol to less than one drink a day for women or two drinks a day for men can help lower blood pressure by about 4 mm Hg.   If you have any questions or concerns please use My Chart to send questions or call the office at [phone]

## 2023-02-01 NOTE — Assessment & Plan Note (Signed)
Assessment: BP in office today 127/79; home BP 120-125/76-80 range with HR ~75-80 Patient with HFrEF Tolerates current medications without any side effects - Entresto 49-51 mg twice daily, carvedilol 12.5 mg twice daily spironolactone 12.5 mg daily and Farxiga 10 mg daily  Denies SOB, palpitation, chest pain, headaches,or edema No concerns with regards to cost of medications, compliance, ability to pick up at pharmacy Reiterated the importance of regular exercise and low salt diet;   Plan: GDMT ACEI/ARB/ARNI [x] Yes [] No Entresto 49-51 mg twice daily   Beta blocker [x] Yes [] No Increase Carvedilol dose from 12.5 twice daily to 25 mg daily   MRA [] Yes [x] No Last K at 5.3 (07/24/22) WNL 4.3 on 10/30/2022 spironolactone 12.5 mg daily follow up BMP lab today   SGLT2 inhibitor [x] Yes [] No Farxiga 10 mg daily   Will stop amlodipine to avoid hypotension as we are increasing carvedilol dose  Labs orderd:  BMP today post MRA initiation  Follow up with PharmD in 4 weeks for further titrations

## 2023-02-02 ENCOUNTER — Telehealth: Payer: Self-pay | Admitting: Pharmacist

## 2023-02-02 LAB — BASIC METABOLIC PANEL
BUN/Creatinine Ratio: 19 (ref 9–23)
BUN: 18 mg/dL (ref 6–24)
CO2: 20 mmol/L (ref 20–29)
Calcium: 9.5 mg/dL (ref 8.7–10.2)
Chloride: 104 mmol/L (ref 96–106)
Creatinine, Ser: 0.93 mg/dL (ref 0.57–1.00)
Glucose: 124 mg/dL — ABNORMAL HIGH (ref 70–99)
Potassium: 4.6 mmol/L (ref 3.5–5.2)
Sodium: 142 mmol/L (ref 134–144)
eGFR: 74 mL/min/{1.73_m2} (ref >59)

## 2023-02-02 NOTE — Telephone Encounter (Signed)
Call to discuss BMP result - LVM  Lab- WNL - continue with current heart medications.

## 2023-02-05 ENCOUNTER — Ambulatory Visit: Payer: BLUE CROSS/BLUE SHIELD | Admitting: Nurse Practitioner

## 2023-02-24 ENCOUNTER — Other Ambulatory Visit: Payer: Self-pay

## 2023-02-26 ENCOUNTER — Other Ambulatory Visit: Payer: Self-pay

## 2023-02-26 ENCOUNTER — Ambulatory Visit: Payer: BLUE CROSS/BLUE SHIELD | Attending: Nurse Practitioner | Admitting: Pharmacist

## 2023-02-26 DIAGNOSIS — Z7984 Long term (current) use of oral hypoglycemic drugs: Secondary | ICD-10-CM

## 2023-02-26 DIAGNOSIS — Z794 Long term (current) use of insulin: Secondary | ICD-10-CM

## 2023-02-26 DIAGNOSIS — Z7985 Long-term (current) use of injectable non-insulin antidiabetic drugs: Secondary | ICD-10-CM | POA: Diagnosis not present

## 2023-02-26 DIAGNOSIS — E118 Type 2 diabetes mellitus with unspecified complications: Secondary | ICD-10-CM | POA: Diagnosis not present

## 2023-02-26 MED ORDER — OZEMPIC (0.25 OR 0.5 MG/DOSE) 2 MG/3ML ~~LOC~~ SOPN
0.5000 mg | PEN_INJECTOR | SUBCUTANEOUS | 2 refills | Status: DC
  Filled 2023-02-26 – 2023-03-12 (×2): qty 3, 28d supply, fill #0

## 2023-02-26 MED ORDER — LANTUS SOLOSTAR 100 UNIT/ML ~~LOC~~ SOPN
6.0000 [IU] | PEN_INJECTOR | Freq: Every day | SUBCUTANEOUS | 99 refills | Status: DC
Start: 2023-02-26 — End: 2023-04-02

## 2023-02-26 NOTE — Progress Notes (Signed)
    S:     No chief complaint on file.  52 y.o. female who presents for diabetes evaluation, education, and management. Patient arrives in good spirits and presents without any assistance. Patient was referred and last seen by Primary Care Provider, Bertram Denver, on 10/30/2022. I saw her on 01/15/2023. WE were able to switch her to Ozempic. She also has CGM coverage.   Since her last visit, she has been doing well! Denies any complaints today. Her Ozempic is covered and she is tolerating this well. Continues with the 0.25 mg weekly dose but denies NV, abdominal pain. No changes in her vision. She continues to take her PO medications and is compliant with 10u daily Lantus.  Family/Social History:  Fhx: HTN, kidney failure, stroke Tobacco: former smoker (quit 1994) Alcohol: none  Current diabetes medications include:  - Farxiga 10 mg daily - Metformin 500 mg - takes 2 tablets BID - Ozempic 0.25 mg weekly  -Lantus 10u daily  Patient reports adherence to [place], metformin, Ozempic and Lantus.   Insurance coverage: BCBS, Medicaid ([employer])  Patient denies hypoglycemic events.  Reported home blood sugars: checks infrequently.  Patient denies polyuria, polydipsia Patient reports neuropathy (nerve pain). No changes. Patient denies visual changes. Patient reports self foot exams.   Patient reported dietary habits: -1-2x meals daily. Finds that she eats more if she's not busy.  -Lunch: yogurt, fruit  -Dinner: pasta, casserole   Patient-reported exercise habits:  -None outside of work. Active at work   O:  Dexcom G7: 30-day avg GMI: 6.3% Time in range: 92% Avg: 126 mg/dL  Lab Results  Component Value Date   HGBA1C 7.3 (A) 01/15/2023   There were no vitals filed for this visit.   Lipid Panel     Component Value Date/Time   CHOL 96 (L) 10/30/2022 0935   TRIG 128 10/30/2022 0935   HDL 37 (L) 10/30/2022 0935   CHOLHDL 2.6 10/30/2022 0935   CHOLHDL 3.0 02/03/2015 0235    VLDL 18 02/03/2015 0235   LDLCALC 36 10/30/2022 0935    Clinical Atherosclerotic Cardiovascular Disease (ASCVD): Yes  The ASCVD Risk score (Arnett DK, et al., 2019) failed to calculate for the following reasons:   The patient has a prior MI or stroke diagnosis   Patient is participating in a Managed Medicaid Plan: YES    A/P: Diabetes longstanding currently close to goal based on A1c. GMI from Dexcom/Clarity looks to be at goal! Commended her for this. Patient is able to verbalize appropriate hypoglycemia management plan. Medication adherence appears appropriate. Will titrate Ozempic and back off Lantus at this time. She is to continue metformin and Farxiga.  -Increase Ozempic to 0.5 mg weekly. Will see her in 1 month and continue to titrate as appropriate.  -Decrease Lantus to 6u daily.  -Continue metformin, Farxiga at current doses.  -Extensively discussed pathophysiology of diabetes, recommended lifestyle interventions, dietary effects on blood sugar control.  -Counseled on s/sx of and management of hypoglycemia.  -Next A1c anticipated 04/2023.   Written patient instructions provided. Patient verbalized understanding of treatment plan.  Total time in face to face counseling 30 minutes.    Follow-up:  Pharmacist in 1 month.  PCP: 03/03/2023  Butch Penny, PharmD, BCACP, CPP Clinical Pharmacist [facility] [phone]

## 2023-03-02 ENCOUNTER — Other Ambulatory Visit: Payer: Self-pay

## 2023-03-03 ENCOUNTER — Ambulatory Visit: Payer: Medicaid Other | Attending: Nurse Practitioner | Admitting: Nurse Practitioner

## 2023-03-03 ENCOUNTER — Ambulatory Visit: Payer: BLUE CROSS/BLUE SHIELD

## 2023-03-03 ENCOUNTER — Encounter: Payer: Self-pay | Admitting: Nurse Practitioner

## 2023-03-03 VITALS — BP 102/68 | HR 81 | Ht 63.0 in | Wt 166.0 lb

## 2023-03-03 DIAGNOSIS — I10 Essential (primary) hypertension: Secondary | ICD-10-CM

## 2023-03-03 DIAGNOSIS — Z1211 Encounter for screening for malignant neoplasm of colon: Secondary | ICD-10-CM

## 2023-03-03 DIAGNOSIS — Z1231 Encounter for screening mammogram for malignant neoplasm of breast: Secondary | ICD-10-CM

## 2023-03-03 NOTE — Progress Notes (Deleted)
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 female patient of Dr Allyson Sabal, in the office today for management of GDMT for HFrEF.  Patient had an echocardiogram in May 2024 that showed her EF had dropped to 30-35%.  She was on lisinopril and carvedilol at the time.  She has since added dapagliflozin and spironolactone, and most recently the carvedilol was increased to 25 mg bid.   At that time amlodipine was discontinued to avoid hypotension.  She returns today for follow up.     Blood Pressure Goal:  130/80  GDMT: ACEI/ARB/ARNI [x] Yes [] No Lisinopril 40  Beta blocker [x] Yes [] No Carvedilol 25 bid  MRA [] Yes [] No Spironolactone 12.5 mg every day   SGLT2 inhibitor [x] Yes [] No Dapagliflozin 10 mg every day     Family Hx:  father died from MI, stroke at 57; mother had stroke at 59, then CKD died at 72; older sister with first MI last year at 35; both sisters with hypertension, other sister with DM; ; 2 children 40, 76 - older with new dm diagnosis    Social Hx:      Tobacco: no  Alcohol: no  Caffeine: coffee daily  Diet:  mostly home cooked meals; does use salt with cooking; variety of meats (not fish), vegetables usually frozen; doesn't snack much  Exercise: 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 [] Yes [x] No  Do you ever skip doses due to side effects? [] Yes [x] No  Do you have trouble affording your medicines? [] Yes [x] No  Are you ever unable to pick up your medication due to transportation difficulties? [] Yes [x] No  Do you ever stop taking your medications because you don't believe they are helping? [] Yes [x] No    Accessory Clinical Findings    Lab Results  Component Value Date   CREATININE 0.93 02/01/2023   BUN 18 02/01/2023   NA 142 02/01/2023   K 4.6 02/01/2023   CL 104 02/01/2023   CO2 20 02/01/2023   Lab Results  Component Value Date   ALT 21 10/30/2022   AST 17 10/30/2022   ALKPHOS 78 10/30/2022   BILITOT 0.2 10/30/2022   Lab Results  Component Value Date   HGBA1C 7.3 (A) 01/15/2023    Home Medications/Allergies    Current Outpatient Medications  Medication Sig Dispense Refill   acetaminophen (TYLENOL) 500 MG tablet Take 500 mg by mouth every 6 (six) hours as needed for moderate pain.     amLODipine (NORVASC) 5 MG tablet Take 1 tablet (5 mg total) by mouth daily. 30 tablet 11   aspirin EC 81 MG tablet  Take 1 tablet (81 mg total) by mouth daily. 30 tablet 11   atorvastatin (LIPITOR) 40 MG tablet Take 1 tablet (40 mg total) by mouth daily. 90 tablet 3   carvedilol (COREG) 12.5 MG tablet Take 1 tablet (12.5 mg total) by mouth 2 (two) times daily with a meal. 180 tablet 3   Continuous Glucose Receiver (DEXCOM G7 RECEIVER) DEVI Use to check blood sugar continuously throughout the day. Change sensors once every 10 days. 1 each 0   Continuous Glucose Sensor (DEXCOM G7 SENSOR) MISC Use to check blood sugar continuously throughout the day. Change sensors once every 10 days. 3 each 6   dapagliflozin propanediol (FARXIGA) 10 MG TABS tablet Take 1 tablet (10 mg total) by mouth daily before breakfast. 90 tablet 3   insulin glargine (LANTUS SOLOSTAR) 100 UNIT/ML Solostar Pen  Inject 6 Units into the skin daily. 15 mL PRN   metFORMIN (GLUCOPHAGE) 500 MG tablet TAKE 2 TABLETS (1,000 MG TOTAL) BY MOUTH 2 (TWO) TIMES DAILY WITH A MEAL. 360 tablet 0   nitroGLYCERIN (NITROSTAT) 0.4 MG SL tablet Place 1 tablet (0.4 mg total) under the tongue every 5 (five) minutes as needed for chest pain. 25 tablet 3   Omega-3 Fatty Acids (FISH OIL) 1200 MG CAPS Take 1,200 mg by mouth at bedtime.     omeprazole (PRILOSEC) 40 MG capsule Take 1 capsule (40 mg total) by mouth daily. 90 capsule 1   sacubitril-valsartan (ENTRESTO) 49-51 MG Take 1 tablet by mouth 2 (two) times daily. (Stop lisinopril 2 days prior to first dose) 60 tablet 6   Semaglutide,0.25 or 0.5MG /DOS, (OZEMPIC, 0.25 OR 0.5 MG/DOSE,) 2 MG/3ML SOPN Inject 0.5 mg into the skin once a week. 3 mL 2   spironolactone (ALDACTONE) 25 MG tablet Take 0.5 tablets (12.5 mg total) by mouth daily. 45 tablet 3   No current facility-administered medications for this visit.     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  {Refresh Note OR Click here to enter BP  :1}***  No problem-specific Assessment & Plan notes found for this encounter.    Phillips Hay PharmD CPP [facility]  [address] [place], [place] [address] [phone]

## 2023-03-03 NOTE — Progress Notes (Signed)
Assessment & Plan:  [name] was seen today for medical management of chronic issues.  Diagnoses and all orders for this visit:  Primary hypertension Continue all antihypertensives as prescribed.  Reminded to bring in blood pressure log for follow  up appointment.  RECOMMENDATIONS: DASH/Mediterranean Diets are healthier choices for HTN.    Colon cancer screening -     Ambulatory referral to Gastroenterology  Breast cancer screening by mammogram -     MM 3D SCREENING MAMMOGRAM BILATERAL BREAST; Future    Patient has been counseled on age-appropriate routine health concerns for screening and prevention. These are reviewed and up-to-date. Referrals have been placed accordingly. Immunizations are up-to-date or declined.    Subjective:   Chief Complaint  Patient presents with   Medical Management of Chronic Issues    [name] 52 y.o. female presents to office today for follow up to HTN   Patient has been counseled on age-appropriate routine health concerns for screening and prevention. These are reviewed and up-to-date. Referrals have been placed accordingly. Immunizations are up-to-date or declined.     MAMMOGRAM: OVERDUE. Referral placed PAP SMEAR: OVERDUE. Declines Colon cancer screening: OVERDUE. Referral placed.   She has a past medical history of CAD, ICM, Carotid artery disease, DM2, H/O medication noncompliance, Hyperlipidemia, Hypertension, MI (Followed by Cardiology), 3v CAD (CABG 2016) PEA arrest, and Tobacco abuse.    HTN Blood pressure is well controlled.  She is currently taking amlodipine 5 mg daily,  carvedilol 12.5 mg twice daily, entresto 49-51 mg BID, spironolactone 12.5mg  daily. [name] recently started by cardiology  BP Readings from Last 3 Encounters:  03/03/23 102/68  02/01/23 127/79  01/01/23 115/74     Review of Systems  Constitutional:  Negative for fever, malaise/fatigue and weight loss.  HENT: Negative.  Negative for nosebleeds.    Eyes: Negative.  Negative for blurred vision, double vision and photophobia.  Respiratory: Negative.  Negative for cough and shortness of breath.   Cardiovascular: Negative.  Negative for chest pain, palpitations and leg swelling.  Gastrointestinal: Negative.  Negative for heartburn, nausea and vomiting.  Musculoskeletal: Negative.  Negative for myalgias.  Neurological: Negative.  Negative for dizziness, focal weakness, seizures and headaches.  Psychiatric/Behavioral: Negative.  Negative for suicidal ideas.     Past Medical History:  Diagnosis Date   CAD (coronary artery disease)    cath 09/07/2014 DES to 95% prox RCA, residual 70-80% origin of AV groove, 90% ostial PDA stenosis; 01/2015, NSTEMI w/ CABG, LIMA-LAD, SVG-OM1, SVG-PCA   Carotid artery disease (HCC)    a. 40-59% BICA by duplex 01/2015.   Diabetes mellitus without complication (HCC)    H/O medication noncompliance    Hyperlipidemia    Hypertension    MI (myocardial infarction) (HCC)    Tobacco abuse     Past Surgical History:  Procedure Laterality Date   ABDOMINAL HYSTERECTOMY  2008   ANTERIOR CERVICAL DECOMP/DISCECTOMY FUSION N/A 08/26/2021   Procedure: Anterior Cervical Decompression/Discectomy Fusion Cervical Six-Seven;  Surgeon: Lisbeth Renshaw, MD;  Location: [facility] OR;  Service: Neurosurgery;  Laterality: N/A;  3C   CARDIAC CATHETERIZATION N/A 02/01/2015   Procedure: Left Heart Cath and Coronary Angiography;  Surgeon: Runell Gess, MD;  Location: [facility] INVASIVE CV LAB;  Service: Cardiovascular;  Laterality: N/A;   CARDIAC CATHETERIZATION N/A 02/07/2015   Procedure: IABP Insertion;  Surgeon: Dolores Patty, MD;  Location: [facility] INVASIVE CV LAB;  Service: Cardiovascular;  Laterality: N/A;   CORONARY ARTERY BYPASS GRAFT N/A 02/07/2015  Procedure: CORONARY ARTERY BYPASS GRAFTING (CABG);  Surgeon: Kerin Perna, MD; LIMA-LAD, SVT-OM1, SVG-PDA   LEFT HEART CATHETERIZATION WITH CORONARY ANGIOGRAM N/A 05/29/2014    Procedure: LEFT HEART CATHETERIZATION WITH CORONARY ANGIOGRAM;  Surgeon: Corky Crafts, MD;  Location: [facility] CATH LAB;  Service: Cardiovascular;  Laterality: N/A;   LEFT HEART CATHETERIZATION WITH CORONARY ANGIOGRAM N/A 09/07/2014   Procedure: LEFT HEART CATHETERIZATION WITH CORONARY ANGIOGRAM;  Surgeon: Marykay Lex, MD;  Location: [facility] CATH LAB;  Service: Cardiovascular;  Laterality: N/A;   PERCUTANEOUS CORONARY STENT INTERVENTION (PCI-S)  05/29/2014   Procedure: PERCUTANEOUS CORONARY STENT INTERVENTION (PCI-S);  Surgeon: Corky Crafts, MD;  Location: [facility] CATH LAB;  Service: Cardiovascular;;   TEE WITHOUT CARDIOVERSION N/A 02/07/2015   Procedure: TRANSESOPHAGEAL ECHOCARDIOGRAM (TEE);  Surgeon: Kerin Perna, MD;  Location: [facility] OR;  Service: Open Heart Surgery;  Laterality: N/A;    Family History  Problem Relation Age of Onset   Hypertension Mother    Kidney failure Mother    Stroke Mother 56   Heart attack Father 29   Heart disease Father     Social History Reviewed with no changes to be made today.   Outpatient Medications Prior to Visit  Medication Sig Dispense Refill   acetaminophen (TYLENOL) 500 MG tablet Take 500 mg by mouth every 6 (six) hours as needed for moderate pain.     amLODipine (NORVASC) 5 MG tablet Take 1 tablet (5 mg total) by mouth daily. 30 tablet 11   aspirin EC 81 MG tablet Take 1 tablet (81 mg total) by mouth daily. 30 tablet 11   atorvastatin (LIPITOR) 40 MG tablet Take 1 tablet (40 mg total) by mouth daily. 90 tablet 3   carvedilol (COREG) 12.5 MG tablet Take 1 tablet (12.5 mg total) by mouth 2 (two) times daily with a meal. 180 tablet 3   Continuous Glucose Receiver (DEXCOM G7 RECEIVER) DEVI Use to check blood sugar continuously throughout the day. Change sensors once every 10 days. 1 each 0   Continuous Glucose Sensor (DEXCOM G7 SENSOR) MISC Use to check blood sugar continuously throughout the day. Change sensors once every 10 days. 3 each 6    dapagliflozin propanediol (FARXIGA) 10 MG TABS tablet Take 1 tablet (10 mg total) by mouth daily before breakfast. 90 tablet 3   insulin glargine (LANTUS SOLOSTAR) 100 UNIT/ML Solostar Pen Inject 6 Units into the skin daily. 15 mL PRN   metFORMIN (GLUCOPHAGE) 500 MG tablet TAKE 2 TABLETS (1,000 MG TOTAL) BY MOUTH 2 (TWO) TIMES DAILY WITH A MEAL. 360 tablet 0   nitroGLYCERIN (NITROSTAT) 0.4 MG SL tablet Place 1 tablet (0.4 mg total) under the tongue every 5 (five) minutes as needed for chest pain. 25 tablet 3   Omega-3 Fatty Acids (FISH OIL) 1200 MG CAPS Take 1,200 mg by mouth at bedtime.     omeprazole (PRILOSEC) 40 MG capsule Take 1 capsule (40 mg total) by mouth daily. 90 capsule 1   sacubitril-valsartan (ENTRESTO) 49-51 MG Take 1 tablet by mouth 2 (two) times daily. (Stop lisinopril 2 days prior to first dose) 60 tablet 6   Semaglutide,0.25 or 0.5MG /DOS, (OZEMPIC, 0.25 OR 0.5 MG/DOSE,) 2 MG/3ML SOPN Inject 0.5 mg into the skin once a week. 3 mL 2   spironolactone (ALDACTONE) 25 MG tablet Take 0.5 tablets (12.5 mg total) by mouth daily. 45 tablet 3   No facility-administered medications prior to visit.    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  Constitutional:      Appearance: She is well-developed.  HENT:     Head: Normocephalic and atraumatic.  Cardiovascular:     Rate and Rhythm: Normal rate and regular rhythm.     Heart sounds: Normal heart sounds. No murmur heard.    No friction rub. No gallop.  Pulmonary:      Effort: Pulmonary effort is normal. No tachypnea or respiratory distress.     Breath sounds: Normal breath sounds. No decreased breath sounds, wheezing, rhonchi or rales.  Chest:     Chest wall: No tenderness.  Abdominal:     General: Bowel sounds are normal.     Palpations: Abdomen is soft.  Musculoskeletal:        General: Normal range of motion.     Cervical back: Normal range of motion.  Skin:    General: Skin is warm and dry.  Neurological:     Mental Status: She is alert and oriented to person, place, and time.     Coordination: Coordination normal.  Psychiatric:        Behavior: Behavior normal. Behavior is cooperative.        Thought Content: Thought content normal.        Judgment: Judgment normal.          Patient has been counseled extensively about nutrition and exercise as well as the importance of adherence with medications and regular follow-up. The patient was given clear instructions to go to ER or return to medical center if symptoms don't improve, worsen or new problems develop. The patient verbalized understanding.   Follow-up: Return in about 3 months (around 06/03/2023).   Claiborne Rigg, FNP-BC [facility] [place], [place] [phone]   03/03/2023, 10:24 AM

## 2023-03-10 ENCOUNTER — Other Ambulatory Visit: Payer: Self-pay

## 2023-03-12 ENCOUNTER — Other Ambulatory Visit: Payer: Self-pay

## 2023-03-15 ENCOUNTER — Other Ambulatory Visit: Payer: Self-pay

## 2023-03-19 ENCOUNTER — Other Ambulatory Visit: Payer: Self-pay

## 2023-03-26 ENCOUNTER — Other Ambulatory Visit: Payer: Self-pay

## 2023-03-26 ENCOUNTER — Ambulatory Visit
Payer: BLUE CROSS/BLUE SHIELD | Attending: Internal Medicine | Admitting: Pharmacist Clinician (PhC)/ Clinical Pharmacy Specialist

## 2023-03-26 ENCOUNTER — Encounter: Payer: Self-pay | Admitting: Pharmacist Clinician (PhC)/ Clinical Pharmacy Specialist

## 2023-03-26 VITALS — BP 133/79 | HR 80

## 2023-03-26 DIAGNOSIS — I255 Ischemic cardiomyopathy: Secondary | ICD-10-CM | POA: Diagnosis not present

## 2023-03-26 MED ORDER — SACUBITRIL-VALSARTAN 97-103 MG PO TABS: 1.0000 | ORAL_TABLET | Freq: Two times a day (BID) | ORAL | 1 refills | Status: DC

## 2023-03-26 MED ORDER — NITROGLYCERIN 0.4 MG SL SUBL: 0.4000 mg | SUBLINGUAL_TABLET | SUBLINGUAL | 10 refills | Status: DC | PRN

## 2023-03-26 MED ORDER — CARVEDILOL 25 MG PO TABS: 25.0000 mg | ORAL_TABLET | Freq: Two times a day (BID) | ORAL | 3 refills | Status: DC

## 2023-03-26 NOTE — Assessment & Plan Note (Signed)
Her blood pressure today is 133/79. Currently uncontrolled on sacubitril-valsartan 49-51 twice daily, spironolactone 25 mg tab (taking 12.5 mg daily), dapagliflozin 10mg  tab daily, and carvedilol 12.5 twice daily. We recommend increasing Entresto 49-51 to 97-103 mg. The patient was instructed to let us know if she begins using nitroglycerin more frequently. She was also instructed to contact us if her blood pressure starts dropping too low.  P: Discontinue:  Sacubitril-valsartan 49-51 mg twice daily  Start: Sacubitril-valsartan 97-103 mg twice daily  Continue all other medications.  Follow up with Cloyde Reams next week for diabetes management. We will obtain repeat blood work in 2 weeks. Monitoring renal function, blood pressure, and potassium.  Follow up with Dr. Allyson Sabal in April

## 2023-03-26 NOTE — Patient Instructions (Signed)
Follow up appointment: with Dr. Allyson Sabal in April  Go to the lab in 2 weeks to check kidneys/electrolytes  Take your meds as follows:  Increase Entresto to 97/103 mg twice daily  Pick up prescription for carvedilol 25 mg - take 1 tablet twice daily  Continue with your other medications  Check your blood pressure at home daily (if able) and keep record of the readings.  Hypertension "High blood pressure"  Hypertension is often called "The Silent Killer." It rarely causes symptoms until it is extremely  high or has done damage to other organs in the body. For this reason, you should have your  blood pressure checked regularly by your physician. We will check your blood pressure  every time you see a provider at one of our offices.   Your blood pressure reading consists of two numbers. Ideally, blood pressure should be  below 120/80. The first ("top") number is called the systolic pressure. It measures the  pressure in your arteries as your heart beats. The second ("bottom") number is called the diastolic pressure. It measures the pressure in your arteries as the heart relaxes between beats.  The benefits of getting your blood pressure under control are enormous. A 10-point  reduction in systolic blood pressure can reduce your risk of stroke by 27% and heart failure by 28%  To check your pressure at home you will need to:  1. Sit up in a chair, with feet flat on the floor and back supported. Do not cross your ankles or legs. 2. Rest your left arm so that the cuff is about heart level. If the cuff goes on your upper arm,  then just relax the arm on the table, arm of the chair or your lap. If you have a wrist cuff, we  suggest relaxing your wrist against your chest (think of it as Pledging the Flag with the  wrong arm).  3. Place the cuff snugly around your arm, about 1 inch above the crook of your elbow. The  cords should be inside the groove of your elbow.  4. Sit quietly, with the  cuff in place, for about 5 minutes. After that 5 minutes press the power  button to start a reading. 5. Do not talk or move while the reading is taking place.  6. Record your readings on a sheet of paper. Although most cuffs have a memory, it is often  easier to see a pattern developing when the numbers are all in front of you.  7. You can repeat the reading after 1-3 minutes if it is recommended  Make sure your bladder is empty and you have not had caffeine or tobacco within the last 30 min  Always bring your blood pressure log with you to your appointments. If you have not brought your monitor in to be double checked for accuracy, please bring it to your next appointment.  You can find a list of quality blood pressure cuffs at [email or web address]

## 2023-03-26 NOTE — Progress Notes (Signed)
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 female patient of Dr Allyson Sabal, in the office today for management of GDMT for HFrEF.  Patient had an echocardiogram in May 2024 that showed her EF had dropped to 30-35%.  She was on lisinopril and carvedilol at the time.  She has since added dapagliflozin and spironolactone, and most recently the carvedilol was increased to 25 mg bid.   At that time amlodipine was discontinued to avoid hypotension.  Today the patient is seen for heart failure follow-up. Overall, she is doing okay today. She has been having some chest pain/tightness. When she was at work, she did not have her nitroglycerin with her to help ease the discomfort. She has not had any SOB. She has not had problems with her blood sugar. She does wear a blood glucose sensor. The patient eats about 2 meals a day, as she is very busy with 2 jobs. If she drinks a soda, it's usually ginger ale. She drinks coffee twice a day. She has been doing chair exercises on her own.    Blood Pressure Goal:  130/80  GDMT: ACEI/ARB/ARNI [x] Yes [] No Entresto 49/51 mg bid  Beta blocker [x] Yes [] No Carvedilol mg 25 bid  MRA [] Yes [] No Spironolactone 12.5 mg every day   SGLT2 inhibitor [x] Yes [] No Dapagliflozin 10 mg every day     Family Hx:  father died from MI, stroke at  43; mother had stroke at 10, then CKD died at 54; older sister with first MI last year at 34; both sisters with hypertension, other sister with DM; ; 2 children 56, 58 - older with new dm diagnosis    Social Hx:      Tobacco: no  Alcohol: no  Caffeine: coffee daily  Diet:  mostly home cooked meals; does use salt with cooking; variety of meats (not fish), vegetables usually frozen; doesn't snack much  Exercise: 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 [] Yes [x] No  Do you ever skip doses due to side effects? [] Yes [x] No  Do you have trouble affording your medicines? [] Yes [x] No  Are you ever unable to pick up your medication due to transportation difficulties? [] Yes [x] No  Do you ever stop taking your medications because you don't believe they are helping? [] Yes [x] No    Accessory Clinical Findings    Lab Results  Component Value Date   CREATININE 0.93 02/01/2023   BUN 18 02/01/2023   NA 142 02/01/2023   K 4.6 02/01/2023   CL 104 02/01/2023   CO2 20 02/01/2023   Lab Results  Component Value Date  ALT 21 10/30/2022   AST 17 10/30/2022   ALKPHOS 78 10/30/2022   BILITOT 0.2 10/30/2022   Lab Results  Component Value Date   HGBA1C 7.3 (A) 01/15/2023    Home Medications/Allergies    Current Outpatient Medications  Medication Sig Dispense Refill   carvedilol (COREG) 25 MG tablet Take 1 tablet (25 mg total) by mouth 2 (two) times daily. 180 tablet 3   sacubitril-valsartan (ENTRESTO) 97-103 MG Take 1 tablet by mouth 2 (two) times daily. 60 tablet 1   acetaminophen (TYLENOL) 500 MG tablet Take 500 mg by mouth every 6 (six) hours as needed for moderate pain.     amLODipine (NORVASC) 5 MG tablet Take 1 tablet (5 mg total) by mouth daily. 30 tablet 11   aspirin EC 81 MG tablet Take 1 tablet (81 mg total) by mouth daily. 30 tablet 11    atorvastatin (LIPITOR) 40 MG tablet Take 1 tablet (40 mg total) by mouth daily. 90 tablet 3   Continuous Glucose Receiver (DEXCOM G7 RECEIVER) DEVI Use to check blood sugar continuously throughout the day. Change sensors once every 10 days. 1 each 0   Continuous Glucose Sensor (DEXCOM G7 SENSOR) MISC Use to check blood sugar continuously throughout the day. Change sensors once every 10 days. 3 each 6   dapagliflozin propanediol (FARXIGA) 10 MG TABS tablet Take 1 tablet (10 mg total) by mouth daily before breakfast. 90 tablet 3   insulin glargine (LANTUS SOLOSTAR) 100 UNIT/ML Solostar Pen Inject 6 Units into the skin daily. 15 mL PRN   metFORMIN (GLUCOPHAGE) 500 MG tablet TAKE 2 TABLETS (1,000 MG TOTAL) BY MOUTH 2 (TWO) TIMES DAILY WITH A MEAL. 360 tablet 0   nitroGLYCERIN (NITROSTAT) 0.4 MG SL tablet Place 1 tablet (0.4 mg total) under the tongue every 5 (five) minutes as needed for chest pain. 25 tablet 10   Omega-3 Fatty Acids (FISH OIL) 1200 MG CAPS Take 1,200 mg by mouth at bedtime.     omeprazole (PRILOSEC) 40 MG capsule Take 1 capsule (40 mg total) by mouth daily. 90 capsule 1   Semaglutide,0.25 or 0.5MG /DOS, (OZEMPIC, 0.25 OR 0.5 MG/DOSE,) 2 MG/3ML SOPN Inject 0.5 mg into the skin once a week. 3 mL 2   spironolactone (ALDACTONE) 25 MG tablet Take 0.5 tablets (12.5 mg total) by mouth daily. 45 tablet 3   No current facility-administered medications for this visit.     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 Currently uncontrolled on sacubitril-valsartan 49-51 twice daily, spironolactone 25 mg tab (taking 12.5 mg daily), dapagliflozin 10mg  tab daily, and carvedilol 12.5 twice daily. We recommend increasing Entresto 49-51 to 97-103 mg. The patient was instructed to let us know if she begins using  nitroglycerin more frequently. She was also instructed to contact us if her blood pressure starts dropping too low.  P: Discontinue:  Sacubitril-valsartan 49-51 mg twice daily  Start: Sacubitril-valsartan 97-103 mg twice daily  Continue all other medications.  Follow up with Cloyde Reams next week for diabetes management. We will obtain repeat blood work in 2 weeks. Monitoring renal function, blood pressure, and potassium.  Follow up with Dr. Allyson Sabal in April  Buddy Duty PharmD Candiate [facility] class of 2025  I was with student  and patient throughout entire visit and agree with above assessment and plan. Reviewed concerns for chest pain with patient.  She has history of MI and is aware of when symptoms indicate need to go to ED.  Refilled her nitroglycerine today.   Phillips Hay PharmD CPP [facility]  [address] [place], [place] [address] [phone]

## 2023-03-30 ENCOUNTER — Ambulatory Visit
Admission: RE | Admit: 2023-03-30 | Discharge: 2023-03-30 | Disposition: A | Discharge status: Home / Self Care | Payer: BLUE CROSS/BLUE SHIELD | Source: Ambulatory Visit | Attending: Nurse Practitioner | Admitting: Nurse Practitioner

## 2023-03-30 DIAGNOSIS — Z1231 Encounter for screening mammogram for malignant neoplasm of breast: Secondary | ICD-10-CM

## 2023-04-01 NOTE — Progress Notes (Signed)
    S:    52 y.o. female who presents for diabetes evaluation, education, and management. PMH significant for T2DM, CAD (CABG 2016), HFrEF (EF 30-35% in 09/2022), HLD, HTN, MI, PEA arrest Followed by cardiology. Patient was referred and last seen by Primary Care Provider, Bertram Denver, on 03/03/2023. Last seen by cardiology on 03/26/23.  Last seen by PharmD on 02/26/23 at which Ozempic was titrated and Lantus dose was decreased.  Today, patient arrives in good spirits. Denies NV, abdominal pain, vision changes. Reports adherence with all DM medications. Reports at least 3 doses given of Ozempic 0.5mg  dose. Reports some burning with last Ozempic injection, but managed by injecting part of dose, letting burning resolve, then injecting rest of dose. She states the burning is not bothersome.   Family/Social History:  Fhx: HTN, kidney failure, stroke, MI Tobacco: former smoker (quit 1994) Alcohol: none  Current diabetes medications include:  - Farxiga 10 mg daily - Metformin 500 mg - takes 2 tablets BID - Ozempic 0.5 mg weekly on Mondays - Lantus 6u daily  Insurance coverage: BCBS, Medicaid ([employer])  Patient denies hypoglycemic events. Notes lows on Dexcom are likely from waiting too long to eat. She's seen as low as 60s but no symptoms.  Patient denies polyuria, polydipsia Patient reports neuropathy (nerve pain). No changes. Patient denies visual changes. Patient reports self foot exams.   Patient reported dietary habits: -1-2x meals daily. Finds that she eats more if she's not busy.  -Lunch: yogurt, fruit  -Dinner: pasta, casserole   Patient-reported exercise habits:  -Sitting exercises  O:  Current BG: 101 mg/dL Dexcom G7: 16-XWR avg GMI: 6.2% Time in range: 97% Time below range: 1% Avg: 122 mg/dL  Per 60/45/40 PharmD Visit: Dexcom G7: 30-day avg GMI: 6.3% Time in range: 92% Avg: 126 mg/dL  Lab Results  Component Value Date   HGBA1C 7.3 (A) 01/15/2023   There  were no vitals filed for this visit.   Lipid Panel     Component Value Date/Time   CHOL 96 (L) 10/30/2022 0935   TRIG 128 10/30/2022 0935   HDL 37 (L) 10/30/2022 0935   CHOLHDL 2.6 10/30/2022 0935   CHOLHDL 3.0 02/03/2015 0235   VLDL 18 02/03/2015 0235   LDLCALC 36 10/30/2022 0935    Clinical Atherosclerotic Cardiovascular Disease (ASCVD): Yes  The ASCVD Risk score (Arnett DK, et al., 2019) failed to calculate for the following reasons:   The patient has a prior MI or stroke diagnosis   A/P: Diabetes longstanding currently close to goal based on A1c of 7.3% (01/2023). CGM continues to be at goal, so commended patient for this. Patient is able to verbalize appropriate hypoglycemia management plan. Medication adherence appears appropriate. Tolerating Ozempic well. Will further titrate Ozempic and back off Lantus at this time. She is to continue metformin and [place].  -After completing 4th dose of Ozempic 0.5mg , increase Ozempic to 1 mg weekly -Discontinue Lantus once Ozempic is increased to 1 mg -Continue Farxiga 10 mg daily -Continue metformin 1000 mg BID -Extensively discussed pathophysiology of diabetes, recommended lifestyle interventions, dietary effects on blood sugar control.  -Counseled on s/sx of and management of hypoglycemia.  -Next A1c anticipated 04/2023.   Written patient instructions provided. Patient verbalized understanding of treatment plan.  Total time in face to face counseling 20 minutes.    Follow-up:  Pharmacist: 05/06/23 PCP: 06/04/23  Nicole Kindred, PharmD PGY1 Pharmacy Resident 04/02/2023 2:00 PM

## 2023-04-02 ENCOUNTER — Encounter: Payer: Self-pay | Admitting: Pharmacist

## 2023-04-02 ENCOUNTER — Ambulatory Visit: Payer: Medicaid Other | Attending: Nurse Practitioner | Admitting: Pharmacist

## 2023-04-02 ENCOUNTER — Other Ambulatory Visit: Payer: Self-pay

## 2023-04-02 DIAGNOSIS — Z7985 Long-term (current) use of injectable non-insulin antidiabetic drugs: Secondary | ICD-10-CM

## 2023-04-02 DIAGNOSIS — Z7984 Long term (current) use of oral hypoglycemic drugs: Secondary | ICD-10-CM | POA: Diagnosis not present

## 2023-04-02 DIAGNOSIS — Z794 Long term (current) use of insulin: Secondary | ICD-10-CM | POA: Diagnosis not present

## 2023-04-02 DIAGNOSIS — E118 Type 2 diabetes mellitus with unspecified complications: Secondary | ICD-10-CM | POA: Diagnosis not present

## 2023-04-02 MED ORDER — SEMAGLUTIDE (1 MG/DOSE) 4 MG/3ML ~~LOC~~ SOPN
1.0000 mg | PEN_INJECTOR | SUBCUTANEOUS | 1 refills | Status: DC
  Filled 2023-04-02 – 2023-04-14 (×2): qty 3, 28d supply, fill #0
  Filled 2023-05-21: qty 3, 28d supply, fill #1

## 2023-04-05 ENCOUNTER — Telehealth: Payer: Self-pay | Admitting: Cardiovascular Disease

## 2023-04-05 NOTE — Telephone Encounter (Signed)
Called patient with no answer. She did give the okay to leave message if she doesn't answer because she will be at work. Left detail message on identifiable voicemail. Patient told to call back with questions or concerns.  [name], RPH-CPP  Pharmacist Specialty: Pharmacist  Please have patient cut the carvedilol back to 12.5 mg twice daily.  She can take a small salty snack with water to help with low BP readings. (<100 systolic)

## 2023-04-05 NOTE — Telephone Encounter (Signed)
Pt c/o BP issue: STAT if pt c/o blurred vision, one-sided weakness or slurred speech  1. What are your last 5 BP readings?  Today 84/70 HR 82   2. Are you having any other symptoms (ex. Dizziness, headache, blurred vision, passed out)? Lightheadedness, dizziness   3. What is your BP issue? Patient is calling because we changed her medication recently. Patient stated since we changed her medication, her BP has been running low. Please advise

## 2023-04-05 NOTE — Telephone Encounter (Signed)
Please have patient cut the carvedilol back to 12.5 mg twice daily.  She can take a small salty snack with water to help with low BP readings. (<100 systolic)

## 2023-04-05 NOTE — Telephone Encounter (Signed)
Called and spoke to patient concerning her low BP readings. Patient report she has been light headed for the last 2 days. She stated she took her BP when she got home Saturday from work because she was feeling light headed and her BP was 100/70. Today she felt light headed again and checked her BP and it was 84/70 HR 82. Patient medication this morning before checking BP. She was not able to provide BP reasings at this time. She recently had a medication change to her Coreg which was increased from 12.5 mg twice daily to 25 mg twice daily. Her Sacubitril-Valsartan was also increased from 49-51 mg to 97-103 mg two times daily. She deny any other symptoms at this time. Please advised.

## 2023-04-06 ENCOUNTER — Other Ambulatory Visit: Payer: Self-pay

## 2023-04-14 ENCOUNTER — Other Ambulatory Visit: Payer: Self-pay

## 2023-04-15 ENCOUNTER — Other Ambulatory Visit: Payer: Self-pay

## 2023-04-23 ENCOUNTER — Other Ambulatory Visit: Payer: Self-pay

## 2023-05-04 ENCOUNTER — Other Ambulatory Visit: Payer: Self-pay | Admitting: Nurse Practitioner

## 2023-05-04 DIAGNOSIS — E118 Type 2 diabetes mellitus with unspecified complications: Secondary | ICD-10-CM

## 2023-05-06 ENCOUNTER — Ambulatory Visit: Payer: BLUE CROSS/BLUE SHIELD | Admitting: Pharmacist

## 2023-05-22 ENCOUNTER — Other Ambulatory Visit: Payer: Self-pay | Admitting: Cardiovascular Disease

## 2023-05-24 ENCOUNTER — Other Ambulatory Visit: Payer: Self-pay

## 2023-06-04 ENCOUNTER — Ambulatory Visit: Payer: BLUE CROSS/BLUE SHIELD | Admitting: Nurse Practitioner

## 2023-06-15 ENCOUNTER — Other Ambulatory Visit: Payer: Self-pay | Admitting: Family Medicine

## 2023-06-15 NOTE — Telephone Encounter (Signed)
Requested Prescriptions  Pending Prescriptions Disp Refills   omeprazole (PRILOSEC) 40 MG capsule [Pharmacy Med Name: OMEPRAZOLE DR 40 MG CAPSULE] 90 capsule 1    Sig: TAKE 1 CAPSULE (40 MG TOTAL) BY MOUTH DAILY.     Gastroenterology: Proton Pump Inhibitors Passed - 06/15/2023  3:12 PM      Passed - Valid encounter within last 12 months    Recent Outpatient Visits           2 months ago Diabetes mellitus type 2 with complications [facility])   [facility] [place]. [facility] Drucilla Chalet, RPH-CPP   3 months ago Primary hypertension   [facility] [place]. [facility] Bertram Denver W, NP   3 months ago Diabetes mellitus type 2 with complications [facility])   [facility] [place]. [facility] Lois Huxley, [place] L, RPH-CPP   5 months ago Diabetes mellitus type 2 with complications [facility])   [facility] Dept Of [place]. [facility] Lois Huxley, [place] L, RPH-CPP   6 months ago Diabetes mellitus type 2 with complications [facility])   [facility] Dept Of [place]. [facility] Drucilla Chalet, RPH-CPP

## 2023-06-22 ENCOUNTER — Other Ambulatory Visit: Payer: Self-pay | Admitting: Family Medicine

## 2023-06-22 DIAGNOSIS — E118 Type 2 diabetes mellitus with unspecified complications: Secondary | ICD-10-CM

## 2023-06-23 ENCOUNTER — Other Ambulatory Visit: Payer: Self-pay

## 2023-06-23 MED ORDER — OZEMPIC (1 MG/DOSE) 4 MG/3ML ~~LOC~~ SOPN
1.0000 mg | PEN_INJECTOR | SUBCUTANEOUS | 1 refills | Status: DC
  Filled 2023-06-23: qty 3, 28d supply, fill #0

## 2023-06-23 NOTE — Telephone Encounter (Signed)
Requested Prescriptions  Pending Prescriptions Disp Refills   Semaglutide, 1 MG/DOSE, (OZEMPIC, 1 MG/DOSE,) 4 MG/3ML SOPN 3 mL 1    Sig: Inject 1 mg as directed once a week.     Endocrinology:  Diabetes - GLP-1 Receptor Agonists - semaglutide Failed - 06/23/2023  9:36 AM      Failed - HBA1C in normal range and within 180 days    HbA1c, POC (controlled diabetic range)  Date Value Ref Range Status  01/15/2023 7.3 (A) 0.0 - 7.0 % Final         Passed - Cr in normal range and within 360 days    Creat  Date Value Ref Range Status  03/19/2015 0.69 0.50 - 1.10 mg/dL Final   Creatinine, Ser  Date Value Ref Range Status  02/01/2023 0.93 0.57 - 1.00 mg/dL Final         Passed - Valid encounter within last 6 months    Recent Outpatient Visits           2 months ago Diabetes mellitus type 2 with complications (HCC)   [facility] [place]. [facility] Drucilla Chalet, RPH-CPP   3 months ago Primary hypertension   [facility] Of [place]. [facility] Bertram Denver W, NP   3 months ago Diabetes mellitus type 2 with complications [facility])   [facility] [facility] Of [place]. [facility] Lois Huxley, [place] L, RPH-CPP   5 months ago Diabetes mellitus type 2 with complications [facility])   [facility] Dept Of [place]. [facility] Lois Huxley, [place] L, RPH-CPP   6 months ago Diabetes mellitus type 2 with complications [facility])   [place] [facility] Dept Of [place]. [facility] Drucilla Chalet, RPH-CPP

## 2023-06-28 ENCOUNTER — Other Ambulatory Visit: Payer: Self-pay

## 2023-07-01 ENCOUNTER — Other Ambulatory Visit: Payer: Self-pay | Admitting: Cardiovascular Disease

## 2023-07-01 ENCOUNTER — Other Ambulatory Visit: Payer: Self-pay

## 2023-07-02 ENCOUNTER — Other Ambulatory Visit: Payer: Self-pay

## 2023-07-05 ENCOUNTER — Telehealth: Payer: Self-pay | Admitting: Cardiovascular Disease

## 2023-07-05 ENCOUNTER — Other Ambulatory Visit: Payer: Self-pay

## 2023-07-05 MED ORDER — ATORVASTATIN CALCIUM 40 MG PO TABS: 40.0000 mg | ORAL_TABLET | Freq: Every day | ORAL | 0 refills | Status: DC

## 2023-07-05 NOTE — Telephone Encounter (Signed)
*  STAT* If patient is at the pharmacy, call can be transferred to refill team.   1. Which medications need to be refilled? (please list name of each medication and dose if known)   atorvastatin (LIPITOR) 40 MG tablet     4. Which pharmacy/location (including street and city if local pharmacy) is medication to be sent to? [id number] [place], [place] - [address]     5. Do they need a 30 day or 90 day supply? 90

## 2023-07-09 ENCOUNTER — Other Ambulatory Visit: Payer: Self-pay

## 2023-07-15 ENCOUNTER — Other Ambulatory Visit: Payer: Self-pay

## 2023-07-21 ENCOUNTER — Other Ambulatory Visit: Payer: Self-pay

## 2023-07-28 ENCOUNTER — Other Ambulatory Visit: Payer: Self-pay

## 2023-07-30 ENCOUNTER — Other Ambulatory Visit: Payer: Self-pay

## 2023-08-03 ENCOUNTER — Other Ambulatory Visit: Payer: Self-pay | Admitting: Nurse Practitioner

## 2023-08-03 DIAGNOSIS — E118 Type 2 diabetes mellitus with unspecified complications: Secondary | ICD-10-CM

## 2023-08-11 ENCOUNTER — Telehealth: Payer: Self-pay | Admitting: Cardiovascular Disease

## 2023-08-11 MED ORDER — DAPAGLIFLOZIN PROPANEDIOL 10 MG PO TABS
10.0000 mg | ORAL_TABLET | Freq: Every day | ORAL | 0 refills | Status: DC
  Filled 2023-09-09: qty 87, 87d supply, fill #0

## 2023-08-11 NOTE — Telephone Encounter (Signed)
 Pt's medication was sent to pt's pharmacy as requested. Confirmation received.

## 2023-08-11 NOTE — Telephone Encounter (Signed)
*  STAT* If patient is at the pharmacy, call can be transferred to refill team.   1. Which medications need to be refilled? (please list name of each medication and dose if known) Farxiga   2. Would you like to learn more about the convenience, safety, & potential cost savings by using the [facility]?    3. Are you open to using the [facility] (Type [facility].    4. Which pharmacy/location (including street and city if local pharmacy) is medication to be sent to? [facility] [address]   5. Do they need a 30 day or 90 day supply? 90 days and refills- please call today- out of it

## 2023-08-12 ENCOUNTER — Other Ambulatory Visit: Payer: Self-pay

## 2023-08-13 ENCOUNTER — Telehealth: Payer: Self-pay | Admitting: Pharmacy Technician

## 2023-08-13 ENCOUNTER — Telehealth: Payer: Self-pay | Admitting: Cardiovascular Disease

## 2023-08-13 ENCOUNTER — Other Ambulatory Visit (HOSPITAL_COMMUNITY): Payer: Self-pay

## 2023-08-13 NOTE — Telephone Encounter (Signed)
   Sent message to have patient let them know she does not have any other insurances

## 2023-08-13 NOTE — Telephone Encounter (Signed)
 [facility]  Pharmacy states:   -PA needed for [place] [facility]   -patient picked up 3 day supply yesterday for $4   -she is able to pick up another 3 day supply if needed   -PA will need to be completed for patient

## 2023-08-13 NOTE — Telephone Encounter (Signed)
 Patient identification verified by 2 forms. Marilynn Rail, RN    Called and spoke to patient  Patient states:   -to her knowledge Medicaid is primary insurance   -spoke to [facility] yesterday, was told it could take time to be updated as primary  Informed patient:   -Per prior auth team primary insurance needs to be updated   -samples left at from desk   -follow up with insurance in a few days to update status  Patient verbalized understanding, no questions at this time

## 2023-08-13 NOTE — Telephone Encounter (Signed)
 Hi! So I ran a couple test claims and all her insurances that we had listed for her in our system came back that they are termed except the [employer] ([employer]) so I did a prior authorization under [employer] but it said that they are secondary insurance. Patient will need to tell [employer] she does not have any other insurance unless she has insurance that we do not know about. Thank you!

## 2023-08-13 NOTE — Telephone Encounter (Signed)
 Pharmacy Patient Advocate Encounter   Received notification from Pt Calls Messages that prior authorization for farxiga is required/requested.   Insurance verification completed.   The patient is insured through [facility] .   Per test claim: PA required; PA submitted to above mentioned insurance via CoverMyMeds Key/confirmation #/EOC [facility] Status is pending

## 2023-08-13 NOTE — Telephone Encounter (Signed)
 Patient calling the office for samples of medication:   1.  What medication and dosage are you requesting samples for?dapagliflozin propanediol (FARXIGA) 10 MG TABS tablet   2.  Are you currently out of this medication? Will be out on Sunday

## 2023-08-16 ENCOUNTER — Ambulatory Visit: Attending: Cardiovascular Disease | Admitting: Cardiovascular Disease

## 2023-08-16 ENCOUNTER — Encounter: Payer: Self-pay | Admitting: Cardiovascular Disease

## 2023-08-16 VITALS — BP 148/69 | HR 71 | Ht 63.0 in | Wt 167.0 lb

## 2023-08-16 DIAGNOSIS — I2510 Atherosclerotic heart disease of native coronary artery without angina pectoris: Secondary | ICD-10-CM | POA: Diagnosis not present

## 2023-08-16 DIAGNOSIS — E785 Hyperlipidemia, unspecified: Secondary | ICD-10-CM

## 2023-08-16 DIAGNOSIS — R600 Localized edema: Secondary | ICD-10-CM | POA: Insufficient documentation

## 2023-08-16 DIAGNOSIS — I6523 Occlusion and stenosis of bilateral carotid arteries: Secondary | ICD-10-CM

## 2023-08-16 DIAGNOSIS — Z9861 Coronary angioplasty status: Secondary | ICD-10-CM

## 2023-08-16 DIAGNOSIS — I10 Essential (primary) hypertension: Secondary | ICD-10-CM | POA: Diagnosis not present

## 2023-08-16 MED ORDER — SPIRONOLACTONE 25 MG PO TABS
25.0000 mg | ORAL_TABLET | Freq: Every day | ORAL | 3 refills | Status: DC
  Filled 2023-09-09: qty 90, 90d supply, fill #0
  Filled 2023-12-20: qty 90, 90d supply, fill #1

## 2023-08-16 NOTE — Progress Notes (Signed)
 08/16/2023 [name]   1970-07-26  [id number]  Primary Physician Claiborne Rigg, NP Primary Cardiologist: Runell Gess MD FACP, [place], [place], [place]  HPI:  [name] is a 53 y.o.    moderately overweight African-American female with history of tobacco abuse, diabetes, hypertension, hyperlipidemia and medication noncompliance. I last saw her in the office 09/04/2022.  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  She's had RCA intervention in the past with re-intervention on 09/07/14. She had accelerated angina and underwent repeat cardiac catheterization by myself 02/01/15 revealing three-vessel disease with moderate LV dysfunction.  She underwent coronary artery bypass grafting X 3 by Dr. Kathlee Nations Trigt 02/07/15 with a LIMA to LAD, vein to obtuse marginal branch and PDA. Several days postop she had a PEA arrest requiring resuscitation, intubation and placement of an intra-aortic balloon pump. Ultimately she was discharged home. She is recuperating nicely. She did stop smoking. Her most recent 2-D echo performed 08/19/15 revealed EF of 45-50%. Since I saw her back she's remained clinically stable. She does not smoke. She eats a relatively healthy diet according to her.  She did have a Myoview stress test performed 03/08/2018 that was remarkable for inferolateral scar.   She has had issues with difficult to control hypertension. She did see Azalee Course PA-in the clinic since her last office visit with me who initiated lisinopril with escalating doses.   Since I saw her in the office a year ago she continues to do well.  She does have mild swelling of her left lower extremity.  She denies chest pain or shortness of breath.   Current Meds  Medication Sig   acetaminophen (TYLENOL) 500 MG tablet Take 500 mg by mouth every 6 (six) hours as needed for moderate pain.   amLODipine (NORVASC) 5 MG tablet Take 1 tablet (5 mg total) by mouth daily.    aspirin EC 81 MG tablet Take 1 tablet (81 mg total) by mouth daily.   atorvastatin (LIPITOR) 40 MG tablet Take 1 tablet (40 mg total) by mouth daily.   carvedilol (COREG) 25 MG tablet Take 1 tablet (25 mg total) by mouth 2 (two) times daily.   Continuous Glucose Receiver (DEXCOM G7 RECEIVER) DEVI Use to check blood sugar continuously throughout the day. Change sensors once every 10 days.   Continuous Glucose Sensor (DEXCOM G7 SENSOR) MISC Use to check blood sugar continuously throughout the day. Change sensors once every 10 days.   dapagliflozin propanediol (FARXIGA) 10 MG TABS tablet Take 1 tablet (10 mg total) by mouth daily before breakfast.   metFORMIN (GLUCOPHAGE) 500 MG tablet TAKE 2 TABLETS (1,000 MG TOTAL) BY MOUTH 2 (TWO) TIMES DAILY WITH A MEAL.   nitroGLYCERIN (NITROSTAT) 0.4 MG SL tablet Place 1 tablet (0.4 mg total) under the tongue every 5 (five) minutes as needed for chest pain.   Omega-3 Fatty Acids (FISH OIL) 1200 MG CAPS Take 1,200 mg by mouth at bedtime.   omeprazole (PRILOSEC) 40 MG capsule TAKE 1 CAPSULE (40 MG TOTAL) BY MOUTH DAILY.   sacubitril-valsartan (ENTRESTO) 97-103 MG TAKE 1 TABLET BY MOUTH TWICE A DAY   Semaglutide, 1 MG/DOSE, (OZEMPIC, 1 MG/DOSE,) 4 MG/3ML SOPN Inject 1 mg as directed once a week.     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 - Patient should NOT receive heparin (02/10/15 and  02/12/15)    Social History   Socioeconomic History   Marital status: Single    Spouse name: Not on file   Number of children: 2   Years of education: Not on file   Highest education level: Not on file  Occupational History   Occupation: CNA    Employer: [employer]  Tobacco Use   Smoking status: Former    Current packs/day: 0.00    Types: Cigarettes    Quit date: 01/09/1993    Years since quitting: 30.6   Smokeless tobacco: Never  Vaping Use   Vaping status: Never Used  Substance and Sexual Activity    Alcohol use: No    Alcohol/week: 0.0 standard drinks of alcohol   Drug use: No   Sexual activity: Not on file  Other Topics Concern   Not on file  Social History Narrative   Not on file   Social Drivers of Health   Financial Resource Strain: Patient Declined (03/03/2023)   Overall Financial Resource Strain (CARDIA)    Difficulty of Paying Living Expenses: Patient declined  Food Insecurity: No Food Insecurity (03/03/2023)   Hunger Vital Sign    Worried About Running Out of Food in the Last Year: Never true    Ran Out of Food in the Last Year: Never true  Transportation Needs: No Transportation Needs (03/03/2023)   PRAPARE - Administrator, Civil Service (Medical): No    Lack of Transportation (Non-Medical): No  Physical Activity: Inactive (03/03/2023)   Exercise Vital Sign    Days of Exercise per Week: 0 days    Minutes of Exercise per Session: 0 min  Stress: No Stress Concern Present (03/03/2023)   [employer] of Occupational Health - Occupational Stress Questionnaire    Feeling of Stress : Not at all  Social Connections: Socially Isolated (03/03/2023)   Social Connection and Isolation Panel [NHANES]    Frequency of Communication with Friends and Family: Once a week    Frequency of Social Gatherings with Friends and Family: Once a week    Attends Religious Services: Never    Database administrator or Organizations: No    Attends Banker Meetings: Never    Marital Status: Never married  Intimate Partner Violence: Not At Risk (03/03/2023)   Humiliation, Afraid, Rape, and Kick questionnaire    Fear of Current or Ex-Partner: No    Emotionally Abused: No    Physically Abused: No    Sexually Abused: No     Review of Systems: General: negative for chills, fever, night sweats or weight changes.  Cardiovascular: negative for chest pain, dyspnea on exertion, edema, orthopnea, palpitations, paroxysmal nocturnal dyspnea or shortness of  breath Dermatological: negative for rash Respiratory: negative for cough or wheezing Urologic: negative for hematuria Abdominal: negative for nausea, vomiting, diarrhea, bright red blood per rectum, melena, or hematemesis Neurologic: negative for visual changes, syncope, or dizziness All other systems reviewed and are otherwise negative except as noted above.    Blood pressure (!) 148/69, pulse 71, height 5\' 3"  (1.6 m), weight 167 lb (75.8 kg), SpO2 98%.  General appearance: alert and no distress Neck: no adenopathy, no JVD, supple, symmetrical, trachea midline, thyroid not enlarged, symmetric, no tenderness/mass/nodules, and faint high-pitched right carotid bruit Lungs: clear to auscultation bilaterally Heart: regular rate and rhythm, S1, S2 normal, no murmur, click, rub or gallop Extremities: extremities normal, atraumatic, no cyanosis or edema Pulses: 2+ and symmetric Skin: Skin color, texture, turgor  normal. No rashes or lesions Neurologic: Grossly normal  EKG EKG Interpretation Date/Time:  Monday August 16 2023 10:38:59 EDT Ventricular Rate:  68 PR Interval:  126 QRS Duration:  110 QT Interval:  428 QTC Calculation: 455 R Axis:   -29  Text Interpretation: Normal sinus rhythm Cannot rule out Anterior infarct , age undetermined ST & T wave abnormality, consider lateral ischemia When compared with ECG of 20-Aug-2021 14:42, No significant change was found Confirmed by [name] [phone]) on 08/16/2023 10:48:05 AM    ASSESSMENT AND PLAN:   Essential hypertension History of essential hypertension blood pressure measured today at 148/69.  She is on carvedilol, amlodipine and Entresto.  Hyperlipidemia with target LDL less than 70 History of hyperlipidemia on atorvastatin with lipid profile performed 10/30/2022 revealing total cholesterol 96, LDL 36 and HDL 37.  CAD S/P percutaneous coronary angioplasty History of CAD status post RCA intervention in the past with 3  intervention/29/16.  Because of accelerated angina I catheterized her 02/01/2015 revealing three-vessel disease with moderate LV dysfunction.  She underwent CABG x 3 by Dr. Maren Beach 02/07/2015 with LIMA to LAD, vein to an obtuse marginal branch and PDA.  Several days postop she had PEA arrest requiring resuscitation intubation and placement of an intra-aortic balloon pump although she was ultimately discharged home.  Myoview performed 03/08/2018 showed inferolateral scar.  She denies chest pain or shortness of breath.  Ischemic cardiomyopathy History of ischemic cardiomyopathy with an EF when last checked 09/28/2022 of 30 to 35% with grade 1 diastolic dysfunction.  She is on GDMT which had been titrated by Pharm.D.  Will recheck a 2D echo especially in light of the fact of mild left lower extremity edema.  Edema of left lower extremity Trace edema left lower extremity.  She also has a edema and rash on her right hand.  I think this is unrelated.  I am going to get a 2D echo and increase her spironolactone from 12.5 to 25 mg a day.  We will recheck a basic metabolic panel in 10 to 14 days.  I will have her see an APP back in 6 to 8 weeks.     Runell Gess MD FACP,FACC,FAHA, [facility] 08/16/2023 10:58 AM

## 2023-08-16 NOTE — Assessment & Plan Note (Signed)
 History of CAD status post RCA intervention in the past with 3 intervention/29/16.  Because of accelerated angina I catheterized her 02/01/2015 revealing three-vessel disease with moderate LV dysfunction.  She underwent CABG x 3 by Dr. Maren Beach 02/07/2015 with LIMA to LAD, vein to an obtuse marginal branch and PDA.  Several days postop she had PEA arrest requiring resuscitation intubation and placement of an intra-aortic balloon pump although she was ultimately discharged home.  Myoview performed 03/08/2018 showed inferolateral scar.  She denies chest pain or shortness of breath.

## 2023-08-16 NOTE — Assessment & Plan Note (Signed)
 Trace edema left lower extremity.  She also has a edema and rash on her right hand.  I think this is unrelated.  I am going to get a 2D echo and increase her spironolactone from 12.5 to 25 mg a day.  We will recheck a basic metabolic panel in 10 to 14 days.  I will have her see an APP back in 6 to 8 weeks.

## 2023-08-16 NOTE — Assessment & Plan Note (Signed)
 History of ischemic cardiomyopathy with an EF when last checked 09/28/2022 of 30 to 35% with grade 1 diastolic dysfunction.  She is on GDMT which had been titrated by Pharm.D.  Will recheck a 2D echo especially in light of the fact of mild left lower extremity edema.

## 2023-08-16 NOTE — Patient Instructions (Signed)
 Medication Instructions:  Your physician has recommended you make the following change in your medication:   -Increase aldactone (spironolactone) to 25mg  once daily.  *If you need a refill on your cardiac medications before your next appointment, please call your pharmacy*  Lab Work: Your physician recommends that you return for lab work in: BMET in 10-14 days  If you have labs (blood work) drawn today and your tests are completely normal, you will receive your results only by: MyChart Message (if you have MyChart) OR A paper copy in the mail If you have any lab test that is abnormal or we need to change your treatment, we will call you to review the results.  Testing/Procedures: Your physician has requested that you have an echocardiogram. Echocardiography is a painless test that uses sound waves to create images of your heart. It provides your doctor with information about the size and shape of your heart and how well your heart's chambers and valves are working. This procedure takes approximately one hour. There are no restrictions for this procedure. Please do NOT wear cologne, perfume, aftershave, or lotions (deodorant is allowed). Please arrive 15 minutes prior to your appointment time.  Please note: We ask at that you not bring children with you during ultrasound (echo/ vascular) testing. Due to room size and safety concerns, children are not allowed in the ultrasound rooms during exams. Our front office staff cannot provide observation of children in our lobby area while testing is being conducted. An adult accompanying a patient to their appointment will only be allowed in the ultrasound room at the discretion of the ultrasound technician under special circumstances. We apologize for any inconvenience.   Your physician has requested that you have a carotid duplex. This test is an ultrasound of the carotid arteries in your neck. It looks at blood flow through these arteries that supply  the brain with blood. Allow one hour for this exam. There are no restrictions or special instructions.   Please note: We ask at that you not bring children with you during ultrasound (echo/ vascular) testing. Due to room size and safety concerns, children are not allowed in the ultrasound rooms during exams. Our front office staff cannot provide observation of children in our lobby area while testing is being conducted. An adult accompanying a patient to their appointment will only be allowed in the ultrasound room at the discretion of the ultrasound technician under special circumstances. We apologize for any inconvenience.   Follow-Up: At [facility], you and your health needs are our priority.  As part of our continuing mission to provide you with exceptional heart care, our providers are all part of one team.  This team includes your primary Cardiologist (physician) and Advanced Practice Providers or APPs (Physician Assistants and Nurse Practitioners) who all work together to provide you with the care you need, when you need it.  Your next appointment:   6-8 week(s)  Provider:   Micah Flesher, PA-C, Marjie Skiff, PA-C, Robet Leu, PA-C, Azalee Course, PA-C, Bernadene Person, NP, or Reather Littler, NP       Then, [name], MD will plan to see you again in 12 month(s).     We recommend signing up for the patient portal called "MyChart".  Sign up information is provided on this After Visit Summary.  MyChart is used to connect with patients for Virtual Visits (Telemedicine).  Patients are able to view lab/test results, encounter notes, upcoming appointments, etc.  Non-urgent messages can be  sent to your provider as well.   To learn more about what you can do with MyChart, go to [email or web address].   Other Instructions       1st Floor: - Lobby - Registration  - Pharmacy  - Lab - Cafe  2nd Floor: - PV Lab - Diagnostic Testing (echo, CT, nuclear med)  3rd Floor: -  Vacant  4th Floor: - TCTS (cardiothoracic surgery) - AFib Clinic - Structural Heart Clinic - Vascular Surgery  - Vascular Ultrasound  5th Floor: - [facility] Cardiology (general and EP) - Clinical Pharmacy for coumadin, hypertension, lipid, weight-loss medications, and med management appointments    Valet parking services will be available as well.

## 2023-08-16 NOTE — Assessment & Plan Note (Signed)
 History of essential hypertension blood pressure measured today at 148/69.  She is on carvedilol, amlodipine and Entresto.

## 2023-08-16 NOTE — Assessment & Plan Note (Signed)
 History of hyperlipidemia on atorvastatin with lipid profile performed 10/30/2022 revealing total cholesterol 96, LDL 36 and HDL 37.

## 2023-08-17 ENCOUNTER — Other Ambulatory Visit (HOSPITAL_COMMUNITY): Payer: Self-pay

## 2023-08-17 NOTE — Telephone Encounter (Signed)
   [employer] still thinks she has another insurance. Will try again later

## 2023-08-19 ENCOUNTER — Telehealth: Payer: Self-pay | Admitting: Pharmacy Technician

## 2023-08-19 NOTE — Telephone Encounter (Signed)
 Faxed farx[employer] appeal:Pa: [phone] Appeal fax [phone] [employer]

## 2023-08-20 NOTE — Telephone Encounter (Signed)
   I called the patient and left her a message to call me back. Scanned in media under "NEED PT SIGN FOR APPEAL FARXIGA"

## 2023-08-20 NOTE — Telephone Encounter (Signed)
 Faxed farx[employer] appeal:Pa: [phone] Appeal fax [phone] [employer]

## 2023-08-20 NOTE — Telephone Encounter (Signed)
 Patient called back and I emailed her what she hast to sign to [email or web address]

## 2023-08-24 NOTE — Telephone Encounter (Signed)
 I called the patient and she said she is going to re-enroll in her other insurance and she knows they will pay for this

## 2023-08-30 ENCOUNTER — Other Ambulatory Visit (HOSPITAL_COMMUNITY): Payer: Self-pay

## 2023-08-30 ENCOUNTER — Telehealth: Payer: Self-pay | Admitting: Cardiovascular Disease

## 2023-08-30 MED ORDER — DAPAGLIFLOZIN PROPANEDIOL 10 MG PO TABS: 10.0000 mg | ORAL_TABLET | Freq: Every day | ORAL | 0 refills | Status: DC

## 2023-08-30 NOTE — Telephone Encounter (Signed)
 Pt c/o medication issue:  1. Name of Medication:   dapagliflozin  propanediol (FARXIGA ) 10 MG TABS tablet    2. How are you currently taking this medication (dosage and times per day)?    3. Are you having a reaction (difficulty breathing--STAT)? no  4. What is your medication issue? Calling to see if our office have any samples. Please advise

## 2023-08-30 NOTE — Telephone Encounter (Signed)
 Called and made patient aware that this will be forward to prior auth team for advice. Made patient aware that samples will be left at front desk. Understanding verbalized

## 2023-08-31 ENCOUNTER — Other Ambulatory Visit: Payer: Self-pay

## 2023-08-31 ENCOUNTER — Encounter: Payer: Self-pay | Admitting: Nurse Practitioner

## 2023-08-31 ENCOUNTER — Ambulatory Visit: Attending: Nurse Practitioner | Admitting: Nurse Practitioner

## 2023-08-31 ENCOUNTER — Other Ambulatory Visit (HOSPITAL_COMMUNITY): Payer: Self-pay

## 2023-08-31 VITALS — BP 127/76 | HR 64 | Ht 63.0 in | Wt 165.8 lb

## 2023-08-31 DIAGNOSIS — Z7984 Long term (current) use of oral hypoglycemic drugs: Secondary | ICD-10-CM | POA: Diagnosis not present

## 2023-08-31 DIAGNOSIS — Z0100 Encounter for examination of eyes and vision without abnormal findings: Secondary | ICD-10-CM

## 2023-08-31 DIAGNOSIS — Z7985 Long-term (current) use of injectable non-insulin antidiabetic drugs: Secondary | ICD-10-CM

## 2023-08-31 DIAGNOSIS — E118 Type 2 diabetes mellitus with unspecified complications: Secondary | ICD-10-CM | POA: Diagnosis not present

## 2023-08-31 DIAGNOSIS — Z0120 Encounter for dental examination and cleaning without abnormal findings: Secondary | ICD-10-CM | POA: Diagnosis not present

## 2023-08-31 LAB — BASIC METABOLIC PANEL WITH GFR
BUN/Creatinine Ratio: 18 (ref 9–23)
BUN: 17 mg/dL (ref 6–24)
CO2: 20 mmol/L (ref 20–29)
Calcium: 9.2 mg/dL (ref 8.7–10.2)
Chloride: 106 mmol/L (ref 96–106)
Creatinine, Ser: 0.92 mg/dL (ref 0.57–1.00)
Glucose: 130 mg/dL — ABNORMAL HIGH (ref 70–99)
Potassium: 4.9 mmol/L (ref 3.5–5.2)
Sodium: 141 mmol/L (ref 134–144)
eGFR: 75 mL/min/{1.73_m2} (ref >59)

## 2023-08-31 LAB — POCT GLYCOSYLATED HEMOGLOBIN (HGB A1C): HbA1c, POC (controlled diabetic range): 5.9 % (ref 0.0–7.0)

## 2023-08-31 MED ORDER — METFORMIN HCL 500 MG PO TABS
1000.0000 mg | ORAL_TABLET | Freq: Two times a day (BID) | ORAL | 1 refills | Status: AC
  Filled 2023-08-31 – 2023-11-09 (×2): qty 360, 90d supply, fill #0
  Filled 2024-05-10: qty 360, 90d supply, fill #1

## 2023-08-31 MED ORDER — OZEMPIC (1 MG/DOSE) 4 MG/3ML ~~LOC~~ SOPN
1.0000 mg | PEN_INJECTOR | SUBCUTANEOUS | 1 refills | Status: DC
  Filled 2023-08-31: qty 3, 28d supply, fill #0
  Filled 2023-10-06: qty 3, 28d supply, fill #1

## 2023-08-31 NOTE — Telephone Encounter (Signed)
 I called the patient and explained [employer] is saying they are secondary. The patient said she only has [employer]. I gave her the number to call for member services.   She wants now on medicaid

## 2023-08-31 NOTE — Progress Notes (Signed)
 Assessment & Plan:  [name] was seen today for medical management of chronic issues.  Diagnoses and all orders for this visit:  Diabetes mellitus type 2 with complications (HCC) -     POCT glycosylated hemoglobin (Hb A1C) -     metFORMIN  (GLUCOPHAGE ) 500 MG tablet; Take 2 tablets (1,000 mg total) by mouth 2 (two) times daily with a meal. -     Semaglutide , 1 MG/DOSE, (OZEMPIC , 1 MG/DOSE,) 4 MG/3ML SOPN; Inject 1 mg as directed once a week. Continue to check blood sugars at home  Discontinue taking OTC 70/30 insulin   Encounter for routine eye and vision examination -     Ambulatory referral to Ophthalmology  Encounter for routine dental examination -     Ambulatory referral to Dentistry    Patient has been counseled on age-appropriate routine health concerns for screening and prevention. These are reviewed and up-to-date. Referrals have been placed accordingly. Immunizations are up-to-date or declined.    Subjective:   Chief Complaint  Patient presents with   Medical Management of Chronic Issues    [name] 53 y.o. female presents to office today follow up on type 2 diabetes. States she ran out of Ozempic . She thought the medication was not covered under her new insurance plan. Ozempic  is listed as preferred for her [place] Medicaid plan. She went the pharmacy and purchased 70/30 insulin  as a replacement and has been using this based on her own sliding scale over the past few months.  Patient education provided to contact provider for refills on prescribed medications. Explained OTC insulin  can cause complications such as hypoglycemia. Reports checking her blood sugar at home twice or three times per day. She is currently using farxiga  samples from cardiology.  Taking metformin  1000 mg ID as prescribed.    Review of Systems  Constitutional:  Negative for malaise/fatigue.  HENT: Negative.    Eyes:  Negative for blurred vision.  Respiratory: Negative.    Cardiovascular:  Negative.  Negative for leg swelling.  Gastrointestinal: Negative.   Genitourinary: Negative.   Musculoskeletal: Negative.   Skin: Negative.   Neurological: Negative.   Endo/Heme/Allergies: Negative.   Psychiatric/Behavioral: Negative.      Past Medical History:  Diagnosis Date   CAD (coronary artery disease)    cath 09/07/2014 DES to 95% prox RCA, residual 70-80% origin of AV groove, 90% ostial PDA stenosis; 01/2015, NSTEMI w/ CABG, LIMA-LAD, SVG-OM1, SVG-PCA   Carotid artery disease (HCC)    a. 40-59% BICA by duplex 01/2015.   Diabetes mellitus without complication (HCC)    H/O medication noncompliance    Hyperlipidemia    Hypertension    MI (myocardial infarction) (HCC)    Tobacco abuse     Past Surgical History:  Procedure Laterality Date   ABDOMINAL HYSTERECTOMY  2008   ANTERIOR CERVICAL DECOMP/DISCECTOMY FUSION N/A 08/26/2021   Procedure: Anterior Cervical Decompression/Discectomy Fusion Cervical Six-Seven;  Surgeon: Augusto Blonder, MD;  Location: [facility] OR;  Service: Neurosurgery;  Laterality: N/A;  3C   CARDIAC CATHETERIZATION N/A 02/01/2015   Procedure: Left Heart Cath and Coronary Angiography;  Surgeon: Avanell Leigh, MD;  Location: [facility] INVASIVE CV LAB;  Service: Cardiovascular;  Laterality: N/A;   CARDIAC CATHETERIZATION N/A 02/07/2015   Procedure: IABP Insertion;  Surgeon: Mardell Shade, MD;  Location: [facility] INVASIVE CV LAB;  Service: Cardiovascular;  Laterality: N/A;   CORONARY ARTERY BYPASS GRAFT N/A 02/07/2015   Procedure: CORONARY ARTERY BYPASS GRAFTING (CABG);  Surgeon: Heriberto London, MD; Marvin Slot,  SVT-OM1, SVG-PDA   LEFT HEART CATHETERIZATION WITH CORONARY ANGIOGRAM N/A 05/29/2014   Procedure: LEFT HEART CATHETERIZATION WITH CORONARY ANGIOGRAM;  Surgeon: Lucendia Rusk, MD;  [facility] LAB;  Service: Cardiovascular;  Laterality: N/A;   LEFT HEART CATHETERIZATION WITH CORONARY ANGIOGRAM N/A 09/07/2014   Procedure: LEFT HEART CATHETERIZATION WITH  CORONARY ANGIOGRAM;  Surgeon: Arleen Lacer, MD;  Location: [facility] CATH LAB;  Service: Cardiovascular;  Laterality: N/A;   PERCUTANEOUS CORONARY STENT INTERVENTION (PCI-S)  05/29/2014   Procedure: PERCUTANEOUS CORONARY STENT INTERVENTION (PCI-S);  Surgeon: Lucendia Rusk, MD;  Location: [facility] CATH LAB;  Service: Cardiovascular;;   TEE WITHOUT CARDIOVERSION N/A 02/07/2015   Procedure: TRANSESOPHAGEAL ECHOCARDIOGRAM (TEE);  Surgeon: Heriberto London, MD;  Location: [facility] OR;  Service: Open Heart Surgery;  Laterality: N/A;    Family History  Problem Relation Age of Onset   Hypertension Mother    Kidney failure Mother    Stroke Mother 29   Heart attack Father 6   Heart disease Father    Breast cancer Sister     Social History Reviewed with no changes to be made today.   Outpatient Medications Prior to Visit  Medication Sig Dispense Refill   acetaminophen  (TYLENOL ) 500 MG tablet Take 500 mg by mouth every 6 (six) hours as needed for moderate pain.     amLODipine  (NORVASC ) 5 MG tablet Take 1 tablet (5 mg total) by mouth daily. 30 tablet 11   aspirin  EC 81 MG tablet Take 1 tablet (81 mg total) by mouth daily. 30 tablet 11   atorvastatin  (LIPITOR ) 40 MG tablet Take 1 tablet (40 mg total) by mouth daily. 90 tablet 0   carvedilol  (COREG ) 25 MG tablet Take 1 tablet (25 mg total) by mouth 2 (two) times daily. 180 tablet 3   dapagliflozin  propanediol (FARXIGA ) 10 MG TABS tablet Take 1 tablet (10 mg total) by mouth daily before breakfast. 90 tablet 0   nitroGLYCERIN  (NITROSTAT ) 0.4 MG SL tablet Place 1 tablet (0.4 mg total) under the tongue every 5 (five) minutes as needed for chest pain. 25 tablet 10   Omega-3 Fatty Acids (FISH OIL ) 1200 MG CAPS Take 1,200 mg by mouth at bedtime.     omeprazole  (PRILOSEC) 40 MG capsule TAKE 1 CAPSULE (40 MG TOTAL) BY MOUTH DAILY. 90 capsule 1   sacubitril -valsartan  (ENTRESTO ) 97-103 MG TAKE 1 TABLET BY MOUTH TWICE A DAY 60 tablet 11   spironolactone  (ALDACTONE ) 25 MG  tablet Take 1 tablet (25 mg total) by mouth daily. 90 tablet 3   metFORMIN  (GLUCOPHAGE ) 500 MG tablet TAKE 2 TABLETS (1,000 MG TOTAL) BY MOUTH 2 (TWO) TIMES DAILY WITH A MEAL. 120 tablet 0   Continuous Glucose Receiver (DEXCOM G7 RECEIVER) DEVI Use to check blood sugar continuously throughout the day. Change sensors once every 10 days. (Patient not taking: Reported on 08/31/2023) 1 each 0   Continuous Glucose Sensor (DEXCOM G7 SENSOR) MISC Use to check blood sugar continuously throughout the day. Change sensors once every 10 days. (Patient not taking: Reported on 08/31/2023) 3 each 6   dapagliflozin  propanediol (FARXIGA ) 10 MG TABS tablet Take 1 tablet (10 mg total) by mouth daily before breakfast. (Patient not taking: Reported on 08/31/2023) 14 tablet 0   Semaglutide , 1 MG/DOSE, (OZEMPIC , 1 MG/DOSE,) 4 MG/3ML SOPN Inject 1 mg as directed once a week. (Patient not taking: Reported on 08/31/2023) 3 mL 1   No facility-administered medications prior to visit.    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  Constitutional:      Appearance: Normal appearance.  HENT:     Head: Normocephalic.  Cardiovascular:     Rate and Rhythm: Normal rate and regular rhythm.     Pulses:          Dorsalis pedis pulses are 2+ on the right side and 2+ on the left side.       Posterior tibial pulses are 2+ on the right side and 2+ on the left side.     Heart sounds: Normal heart sounds.  Pulmonary:     Effort: Pulmonary effort is normal.     Breath  sounds: Normal breath sounds.  Musculoskeletal:        General: Normal range of motion.     Cervical back: Normal range of motion.     Right lower leg: No edema.     Left lower leg: No edema.  Skin:    General: Skin is warm and dry.  Neurological:     Mental Status: She is alert and oriented to person, place, and time.  Psychiatric:        Attention and Perception: Attention normal.        Mood and Affect: Mood normal.        Speech: Speech normal.        Behavior: Behavior normal. Behavior is cooperative.        Thought Content: Thought content normal.        Cognition and Memory: Cognition normal.        Judgment: Judgment normal.        Patient has been counseled extensively about nutrition and exercise as well as the importance of adherence with medications and regular follow-up. The patient was given clear instructions to go to ER or return to medical center if symptoms don't improve, worsen or new problems develop. The patient verbalized understanding.   Follow-up: Return in about 6 months (around 03/01/2024).   Dori Garbe, FNP-BC [facility] and [facility] [place], [place] [phone]   08/31/2023, 2:49 PM

## 2023-08-31 NOTE — Telephone Encounter (Signed)
 Patient wants to know if [name] can resubmit her PA for Farxiga . She states that her PCP told her that medicaid should not have denied it. She would like a call back.

## 2023-09-01 ENCOUNTER — Encounter: Payer: Self-pay | Admitting: Nurse Practitioner

## 2023-09-01 ENCOUNTER — Other Ambulatory Visit: Payer: Self-pay

## 2023-09-01 NOTE — Progress Notes (Signed)
 I have seen and examined this patient with the advanced practice provider STUDENT and agree with the note below

## 2023-09-01 NOTE — Telephone Encounter (Addendum)
 I called and spoke to appeals and they said to fax the prior auth team to try to get the prior auth to re-open the case instead of going through appeals. Faxed [phone] as requested  Confirmed fax at 12:17pm on 09/01/23

## 2023-09-02 NOTE — Telephone Encounter (Signed)
Send more information as requested:

## 2023-09-03 ENCOUNTER — Other Ambulatory Visit: Payer: Self-pay

## 2023-09-06 ENCOUNTER — Other Ambulatory Visit: Payer: Self-pay

## 2023-09-06 ENCOUNTER — Encounter: Payer: Self-pay | Admitting: Pharmacy Technician

## 2023-09-06 ENCOUNTER — Other Ambulatory Visit (HOSPITAL_COMMUNITY): Payer: Self-pay

## 2023-09-06 NOTE — Telephone Encounter (Addendum)
 Faxed more information on 09/06/23 12:31pm. Case: [id number]    Confirmed fax 12:44pm

## 2023-09-06 NOTE — Telephone Encounter (Signed)
 Pharmacy Patient Advocate Encounter  Received notification from [facility] that Prior Authorization for farxiga  10mg  has been APPROVED from 09/06/23 to 09/05/24. Spoke to pharmacy to process.Copay is $4.00.    PA #/Case ID/Reference #: [id number]    I called the patient to let her know

## 2023-09-07 ENCOUNTER — Ambulatory Visit: Admitting: Cardiovascular Disease

## 2023-09-07 ENCOUNTER — Other Ambulatory Visit: Payer: Self-pay

## 2023-09-08 ENCOUNTER — Other Ambulatory Visit: Payer: Self-pay

## 2023-09-09 ENCOUNTER — Other Ambulatory Visit: Payer: Self-pay

## 2023-09-10 ENCOUNTER — Other Ambulatory Visit: Payer: Self-pay

## 2023-09-13 ENCOUNTER — Other Ambulatory Visit: Payer: Self-pay

## 2023-09-13 MED FILL — Omeprazole Cap Delayed Release 40 MG: ORAL | 90 days supply | Qty: 90 | Fill #0 | Status: AC

## 2023-09-13 MED FILL — Sacubitril-Valsartan Tab 97-103 MG: ORAL | 30 days supply | Qty: 60 | Fill #0 | Status: AC

## 2023-09-16 ENCOUNTER — Other Ambulatory Visit: Payer: Self-pay

## 2023-09-17 ENCOUNTER — Ambulatory Visit (HOSPITAL_COMMUNITY): Attending: Cardiology

## 2023-09-17 DIAGNOSIS — I2510 Atherosclerotic heart disease of native coronary artery without angina pectoris: Secondary | ICD-10-CM | POA: Insufficient documentation

## 2023-09-17 DIAGNOSIS — I10 Essential (primary) hypertension: Secondary | ICD-10-CM | POA: Diagnosis present

## 2023-09-17 DIAGNOSIS — Z9861 Coronary angioplasty status: Secondary | ICD-10-CM | POA: Insufficient documentation

## 2023-09-17 DIAGNOSIS — E785 Hyperlipidemia, unspecified: Secondary | ICD-10-CM | POA: Insufficient documentation

## 2023-09-17 DIAGNOSIS — R600 Localized edema: Secondary | ICD-10-CM | POA: Insufficient documentation

## 2023-09-17 LAB — ECHOCARDIOGRAM COMPLETE
Area-P 1/2: 4.89 cm2
Calc EF: 32.4 %
Est EF: 30
S' Lateral: 5 cm
Single Plane A2C EF: 30.7 %
Single Plane A4C EF: 33.8 %

## 2023-09-19 NOTE — Progress Notes (Addendum)
 Cardiology Office Note:    Date:  09/29/2023   ID:  [name], DOB 05-Mar-1971, MRN [id number]  PCP:  Collins Dean, NP  Cardiologist:  Lauro Portal, MD     Referring MD: Collins Dean, NP   Chief Complaint: follow-up of CAD and ischemic cardiomyopathy   History of Present Illness:    [name] is a 53 y.o. female with a history of CAD s/p multiple interventions (DES to proximal LCX and balloon angiplasty to mid LCX as well as DES to RCA in 05/2014 in setting of NSTEMI, DES to RCA in 08/2014, and  then CABG x3 (LIMA-LAD, SVG-OM,SVG-PDA) in 01/2015), ischemic cardiomyopathy / chronic HFrEF with EF of 30%, bilateral carotid stenosis, hypertension, hyperlipidemia, type 2 diabetes mellitus, medication non-compliance, and tobacco abuse who is followed by Dr. Katheryne Pane and presents today for routine follow-up.   Patient was admitted in 05/2014 for NSTEMI. LHC at that time showed multivessel disease. She underwent DES to proximal LCX and balloon angioplasty to mid LCX as well as DES to RCA. Echo showed LVEF of 55-60%. She underwent repeat LHC in 08/2014 for unstable angina which showed 90% stenosis of proximal RCA and was successful treated with another DES. She was admitted again in 01/2015 for unstable angina. LHC showed patent stents to the proximal to mid RCA but 95% stenosis of distal RCA, 99% stenosis of ostial LCX, and 70% stenosis of proximal LAD. CT surgery was consulted and CABG was recommended. Echo prior to CABG showed LVEF of 55-60% with normal wall motion and mild LVH. She ultimately underwent CABG x3 with LIMA to LAD, SVG-OM1, and SVG to PDA. Post-op course was complicated by PEA arrest, ventricular tachycardia, and cardiogenic shock requiring placement of intra-aortic balloon pump and inotropic support. Repeat Echo showed LVEF of 45% with grade 1 diastolic dysfunction. Hospitalization was also complicated by thrombocytopenia requiring Bivalirudin  . 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  Last ischemic evaluation was a Myoview  in 02/2018 which showed a large defect in the basal inferior, basal inferolateral, mid inferior, mid inferolateral, and apical inferior location consistent with prior infarct but not ischemia. Patient has had multiple Echos since then which have showed a gradual decline in EF and GDMT has been adjusted.   She was last seen by Dr. Katheryne Pane in 08/2023 at which time she reported mild left lower extremity edema but was overall doing well from a cardiac standpoint with no chest pain or shortness of breath. Repeat Echo was ordered and showed LVEF of 30% with akinesis of the entire lateral wall and apical inferior segment and hypokinesis of the entire wall, entire septum, and inferior wall. There was no significant changes compared to prior imaging in 09/2022. Routine carotid dopplers were also ordered but have not been completed yet.  Patient presents today for follow-up. She states she is doing okay since last visit. She reports she gets winded very quickly with certain activities such as working in the yard which she has noticed over the last couple of months. She also reports some chest tightness when she over exerts herself, especially when she is walking up hill. However, the more we discussed this, it sounds like she gets acute short of breath with these activities and then develops some chest tightness. Symptoms resolve once she rest or is able to walk on a flat surface. Her chest discomfort never precedes the shortness of breath. No other chest pain/ discomfort. No orthopnea, PND, or edema. No palpitations. She does describe one  episode of hypotension last week while cooking where her systolic BP dropped to 66 and she had significant dizziness with this. No other lightheadedness/ dizziness. No syncope. BP is soft in the office today at 102/72.  EKGs/Labs/Other Studies Reviewed:    The following studies were reviewed:  Left Cardiac Catheterization  02/01/2015: Ost RPDA to RPDA lesion, 95% stenosed. Ost Cx to Prox Cx lesion, 99% stenosed. Ost LAD to Prox LAD lesion, 70% stenosed. There is moderate left ventricular systolic dysfunction.  Impressions: Mr. Christoffel has RCA stents that are widely patent. She does have a 95% ostial PDA lesion in a small caliber vessel. Her ostial circumflex has progressed now to 99% off of the left main. Her ostial/proximal LAD is 50-70% stenosis segmentally. The LV function has decreased since her last cath now in the 35-45% range with a severe lateral wall motion abnormality in the distribution of the circumflex. Percutaneous intervention of the circumflex is high risk given the acuity of takeoff as well as the proximity to the left main originating from a diseased portion of the vessel. Other options include coronary artery bypass grafting. The sheath was removed and pressure held on the groin to achieve hemostasis. The patient did have a moderate size hematoma because of significant hypertension requiring IV hydralazine  and nitroglycerin . Presently restart heparin  6 hours after sheath pull without a bolus and to have TCTS evaluate.  _______________  Myoview  03/08/2018: The left ventricular ejection fraction is moderately decreased (30-44%). Nuclear stress EF: 31%. There is inferoseptal and inferolateral wall akinesis. There was no ST segment deviation noted during stress. Defect 1: There is a large defect of severe severity present in the basal inferior, basal inferolateral, mid inferior, mid inferolateral and apical inferior location. This represents old infarct pattern. This is a high risk study. Findings consistent with prior myocardial infarction. There is no significant ischemia identified. _______________  Echocardiogram 09/17/2023: Impressions: 1. Left ventricular ejection fraction, by estimation, is 30%. Left  ventricular ejection fraction by 3D volume is 29 %. The left ventricle has  severely decreased  function. The left ventricle demonstrates regional wall  motion abnormalities (see scoring  diagram/findings for description). Left ventricular diastolic parameters  are consistent with Grade I diastolic dysfunction (impaired relaxation).  No LV thrombus.   2. Right ventricular systolic function is normal. The right ventricular  size is normal. There is normal pulmonary artery systolic pressure.   3. Diminished left atrial reservoir strain; 13.8%.   4. The mitral valve is normal in structure. Trivial mitral valve  regurgitation. No evidence of mitral stenosis. The mean mitral valve  gradient is 1.3 mmHg with average heart rate of 76 bpm.   5. The aortic valve is tricuspid. Aortic valve regurgitation is not  visualized. No aortic stenosis is present.   6. The inferior vena cava is normal in size with greater than 50%  respiratory variability, suggesting right atrial pressure of 3 mmHg.   Comparison(s): No significant change from prior study. Prior images  reviewed side by side.  _______________   EKG:  EKG not ordered today.   Recent Labs: 10/30/2022: ALT 21 08/31/2023: BUN 17; Creatinine, Ser 0.92; Potassium 4.9; Sodium 141  Recent Lipid Panel    Component Value Date/Time   CHOL 96 (L) 10/30/2022 0935   TRIG 128 10/30/2022 0935   HDL 37 (L) 10/30/2022 0935   CHOLHDL 2.6 10/30/2022 0935   CHOLHDL 3.0 02/03/2015 0235   VLDL 18 02/03/2015 0235   LDLCALC 36 10/30/2022 0935  Physical Exam:    Vital Signs: BP 102/72 (BP Location: Left Arm, Patient Position: Sitting, Cuff Size: Normal)   Pulse (!) 59   Ht 5\' 3"  (1.6 m)   Wt 146 lb (66.2 kg)   SpO2 94%   BMI 25.86 kg/m     Wt Readings from Last 3 Encounters:  09/29/23 146 lb (66.2 kg)  08/31/23 165 lb 12.8 oz (75.2 kg)  08/16/23 167 lb (75.8 kg)     General: 53 y.o. African-American female in no acute distress. HEENT: Normocephalic and atraumatic. Sclera clear.  Neck: Supple.  No JVD. Heart: RRR. Distinct S1 and S2.  No murmurs, gallops, or rubs.  Lungs: No increased work of breathing. Clear to ausculation bilaterally. No wheezes, rhonchi, or rales.  Extremities: No lower extremity edema.  Skin: Warm and dry. Neuro: No focal deficits. Psych: Normal affect. Responds appropriately.   Assessment:    1. Chest tightness   2. Dyspnea on exertion   3. Coronary artery disease involving native coronary artery of native heart without angina pectoris   4. Ischemic cardiomyopathy   5. Chronic HFrEF (heart failure with reduced ejection fraction) (HCC)   6. Bilateral carotid artery stenosis   7. Primary hypertension   8. Hyperlipidemia, unspecified hyperlipidemia type   9. Type 2 diabetes mellitus with complication, without long-term current use of insulin  (HCC)   10. Tobacco abuse     Plan:    Chest Tightness Dyspnea on Exertion CAD s/p CABG History of multiple PCI in early 2016 and then CABG x3 in 01/2015. Myoview  in 02/2018 showed a large defect in the basal inferior, basal inferolateral, mid inferior, mid inferolateral, and apical inferior location consistent with prior infarct but not ischemia. - Patient describes some chest tightness when she over exerts herself but this occurs after she gets acutely short of breath. No other chest pain. She does not have any chest pain that precedes shortness of breath. - Continue aspirin  and statin.  - Will get cardiac PET stress given above symptoms, gradual decline in EF, and the fact that she has not had an ischemic evaluation since 2019.   Shared Decision Making/Informed Consent{ The risks [chest pain, shortness of breath, cardiac arrhythmias, dizziness, blood pressure fluctuations, myocardial infarction, stroke/transient ischemic attack, nausea, vomiting, allergic reaction, radiation exposure, metallic taste sensation and life-threatening complications (estimated to be 1 in 10,000)], benefits (risk stratification, diagnosing coronary artery disease, treatment  guidance) and alternatives of a cardiac PET stress test were discussed in detail with Ms. Frady and she agrees to proceed.    Ischemic Cardiomyopathy Chronic HFrEF EF has gradually declined over the last few years. Recent Echo on 09/17/2023 showed LVEF of 30% with akinesis of the entire lateral wall and apical inferior segment and hypokinesis of the entire wall, entire septum, and inferior wall.  - Euvolemic on exam.  - Continue Entresto  97-103mg  twice daily.  - Continue Coreg  25mg  twice daily. Provided refill of this today. - Continue Spironolactone  25mg  daily.  - Continue Farxiga  10mg  daily.  - Discussed importance of daily weights and sodium restrictions. - Recommended referral to EP for consideration of ICD for primary prevention of sudden cardiac death given persistently low EF. However, patient would like to wait for results of cardiac PET stress test.  Bilateral Carotid Stenosis Last carotid dopplers in 2019 showed 1-39% stenosis of bilateral ICAs. Repeat dopplers were ordered at last visit and was scheduled for 09/20/2023 but she states she left without getting this done due to the wait  time. - Continue aspirin  and statin.  - Will have her reschedule her carotid dopplers.  Hypertension BP soft in the office at 102/72. She reports systolic BP dropped to as low as the 60s last week and she had associated dizziness with this. - Will stop Amlodipine . - Continue medications for CHF as above.   Hyperlipidemia Lipid panel in 10/2022: Total Cholesterol 96, Triglycerides 128, HDL 37, LDL 36. LDL goal <55 given CAD.  - Continue Lipitor  40mg  daily.   Type 2 Diabetes Mellitus Hemoglobin A1c 5.9% in 08/2023.  - On Metformin  and Ozempic .  - Management per PCP.   Disposition: Follow up in 3 months after cardiac PET stress test.   Signed, Casimer Clear, PA-C  09/29/2023 1:02 PM    [facility]  ADDENDUM 09/30/2023: After patient's visit yesterday, she called our office and  said she does not think she is taking Amlodipine . She could not find it in her medications at home. Therefore, will decrease Coreg  to 12.5mg  twice daily given soft BP as above.  Amyri Frenz E Mable Dara, PA-C 09/30/2023 7:42 AM

## 2023-09-20 ENCOUNTER — Telehealth (HOSPITAL_COMMUNITY): Payer: Self-pay

## 2023-09-20 ENCOUNTER — Encounter (HOSPITAL_COMMUNITY): Payer: Self-pay

## 2023-09-20 ENCOUNTER — Ambulatory Visit (HOSPITAL_COMMUNITY)
Admission: RE | Admit: 2023-09-20 | Discharge: 2023-09-20 | Disposition: A | Discharge status: Home / Self Care | Source: Ambulatory Visit | Attending: Cardiovascular Disease | Admitting: Cardiovascular Disease

## 2023-09-20 DIAGNOSIS — I6523 Occlusion and stenosis of bilateral carotid arteries: Secondary | ICD-10-CM

## 2023-09-20 NOTE — Telephone Encounter (Signed)
 Attempted to contact the patient to schedule VAS US .  No answer.  Unable to leave message.  Second Attempt. Was unable to provide direct contact number for scheduling: [phone]. Additional information provided below  09/20/23 attempted contacting patient for RS due to cx, no answer, no voicemail 09/20/23 patient cx appointment due to waiting time 08/16/23 Contacted, and scheduled

## 2023-09-21 ENCOUNTER — Encounter: Payer: Self-pay | Admitting: Cardiovascular Disease

## 2023-09-23 ENCOUNTER — Ambulatory Visit: Payer: Self-pay

## 2023-09-23 DIAGNOSIS — I6523 Occlusion and stenosis of bilateral carotid arteries: Secondary | ICD-10-CM

## 2023-09-29 ENCOUNTER — Other Ambulatory Visit: Payer: Self-pay

## 2023-09-29 ENCOUNTER — Other Ambulatory Visit: Payer: Self-pay | Admitting: Cardiovascular Disease

## 2023-09-29 ENCOUNTER — Encounter: Payer: Self-pay | Admitting: Student

## 2023-09-29 ENCOUNTER — Ambulatory Visit: Payer: Self-pay | Attending: Student | Admitting: Student

## 2023-09-29 VITALS — BP 102/72 | HR 59 | Ht 63.0 in | Wt 146.0 lb

## 2023-09-29 DIAGNOSIS — E785 Hyperlipidemia, unspecified: Secondary | ICD-10-CM

## 2023-09-29 DIAGNOSIS — R0789 Other chest pain: Secondary | ICD-10-CM

## 2023-09-29 DIAGNOSIS — I10 Essential (primary) hypertension: Secondary | ICD-10-CM | POA: Diagnosis present

## 2023-09-29 DIAGNOSIS — I5022 Chronic systolic (congestive) heart failure: Secondary | ICD-10-CM | POA: Diagnosis present

## 2023-09-29 DIAGNOSIS — I2510 Atherosclerotic heart disease of native coronary artery without angina pectoris: Secondary | ICD-10-CM

## 2023-09-29 DIAGNOSIS — Z720 Tobacco use: Secondary | ICD-10-CM | POA: Diagnosis present

## 2023-09-29 DIAGNOSIS — R0609 Other forms of dyspnea: Secondary | ICD-10-CM | POA: Diagnosis present

## 2023-09-29 DIAGNOSIS — E118 Type 2 diabetes mellitus with unspecified complications: Secondary | ICD-10-CM

## 2023-09-29 DIAGNOSIS — I255 Ischemic cardiomyopathy: Secondary | ICD-10-CM | POA: Diagnosis present

## 2023-09-29 DIAGNOSIS — I6523 Occlusion and stenosis of bilateral carotid arteries: Secondary | ICD-10-CM

## 2023-09-29 MED ORDER — CARVEDILOL 25 MG PO TABS
25.0000 mg | ORAL_TABLET | Freq: Two times a day (BID) | ORAL | 3 refills | Status: DC
  Filled 2023-09-29: qty 180, 90d supply, fill #0

## 2023-09-29 NOTE — Telephone Encounter (Signed)
 Pt advised that she is to stop Amlodipine .  States she does not think she was taking it at all, she cannot find it in her medications.  Advised not to take if she does find it at home/in her medications.  Forwarding to provider for her FYI.

## 2023-09-29 NOTE — Patient Instructions (Signed)
 Heart Failure Education: Weigh yourself EVERY morning after you go to the bathroom but before you eat or drink anything. Write this number down in a weight log/diary. If you gain 3 pounds overnight or 5 pounds in a week, call the office. Take your medicines as prescribed. If you have concerns about your medications, please call us  before you stop taking them.  Eat low salt foods--Limit salt (sodium) to 2000 mg per day. This will help prevent your body from holding onto fluid. Read food labels as many processed foods have a lot of sodium, especially canned goods and prepackaged meats. If you would like some assistance choosing low sodium foods, we would be happy to set you up with a nutritionist. Limit all fluids for the day to less than 2 liters (64 ounces). Fluid includes all drinks, coffee, juice, ice chips, soup, jello, and all other liquids. Stay as active as you can everyday. Staying active will give you more energy and make your muscles stronger. Start with 5 minutes at a time and work your way up to 30 minutes a day. Break up your activities--do some in the morning and some in the afternoon. Start with 3 days per week and work your way up to 5 days as you can.  If you have chest pain, feel short of breath, dizzy, or lightheaded, STOP. If you don't feel better after a short rest, call 911. If you do feel better, call the office to let us  know you have symptoms with exercise.   Medication Instructions:  Stop Amlodipine   Continue all other medications *If you need a refill on your cardiac medications before your next appointment, please call your pharmacy*  Lab Work: None ordered  Testing/Procedures: Cardiac Pet Scan will be scheduled after approved by insurance  Follow instructions below  Schedule Carotid Dopplers  first available  Follow-Up: At [facility], you and your health needs are our priority.  As part of our continuing mission to provide you with exceptional heart care,  our providers are all part of one team.  This team includes your primary Cardiologist (physician) and Advanced Practice Providers or APPs (Physician Assistants and Nurse Practitioners) who all work together to provide you with the care you need, when you need it.  Your next appointment:  3 months after test    Provider:  Callie Goodrich PA   We recommend signing up for the patient portal called "MyChart".  Sign up information is provided on this After Visit Summary.  MyChart is used to connect with patients for Virtual Visits (Telemedicine).  Patients are able to view lab/test results, encounter notes, upcoming appointments, etc.  Non-urgent messages can be sent to your provider as well.   To learn more about what you can do with MyChart, go to [email or web address].      Please report to Radiology at the [facility] Main Entrance 30 minutes early for your test.  [address] [place], [place] [address]                         OR   Please report to Radiology at [facility] Main Entrance, medical mall, 30 mins prior to your test.  [address]  [place], [place]  How to Prepare for Your Cardiac PET/CT Stress Test:  Nothing to eat or drink, except water , 3 hours prior to arrival time.  NO caffeine/decaffeinated products, or chocolate 12 hours prior to arrival. (Please  note decaffeinated beverages (teas/coffees) still contain caffeine).  If you have caffeine within 12 hours prior, the test will need to be rescheduled.  Medication instructions: Do not take nitrates (isosorbide mononitrate, Ranexa) the day before or day of test Do not take tamsulosin the day before or morning of test Hold theophylline containing medications for 12 hours. Hold Dipyridamole 48 hours prior to the test.  Diabetic Preparation: If able to eat breakfast prior to 3 hour fasting, you may take all medications, including your insulin . Do not worry if you miss your  breakfast dose of insulin  - start at your next meal. If you do not eat prior to 3 hour fast-Hold all diabetes (oral and insulin ) medications. Patients who wear a continuous glucose monitor MUST remove the device prior to scanning.  You may take your remaining medications with water .  NO perfume, cologne or lotion on chest or abdomen area. FEMALES - Please avoid wearing dresses to this appointment.  Total time is 1 to 2 hours; you may want to bring reading material for the waiting time.    In preparation for your appointment, medication and supplies will be purchased.  Appointment availability is limited, so if you need to cancel or reschedule, please call the Radiology Department Scheduler at [phone] 24 hours in advance to avoid a cancellation fee of $100.00  What to Expect When you Arrive:  Once you arrive and check in for your appointment, you will be taken to a preparation room within the Radiology Department.  A technologist or Nurse will obtain your medical history, verify that you are correctly prepped for the exam, and explain the procedure.  Afterwards, an IV will be started in your arm and electrodes will be placed on your skin for EKG monitoring during the stress portion of the exam. Then you will be escorted to the PET/CT scanner.  There, staff will get you positioned on the scanner and obtain a blood pressure and EKG.  During the exam, you will continue to be connected to the EKG and blood pressure machines.  A small, safe amount of a radioactive tracer will be injected in your IV to obtain a series of pictures of your heart along with an injection of a stress agent.    After your Exam:  It is recommended that you eat a meal and drink a caffeinated beverage to counter act any effects of the stress agent.  Drink plenty of fluids for the remainder of the day and urinate frequently for the first couple of hours after the exam.  Your doctor will inform you of your test results  within 7-10 business days.  For more information and frequently asked questions, please visit our website: [email or web address]  For questions about your test or how to prepare for your test, please call: Cardiac Imaging Nurse Navigators Office: [phone]

## 2023-09-29 NOTE — Telephone Encounter (Signed)
 Pt c/o medication issue:  1. Name of Medication:  Atorvastatin   2. How are you currently taking this medication (dosage and times per day)?   3. Are you having a reaction (difficulty breathing--STAT)?   4. What is your medication issue?  Patient says she forgot which medication she was advised to stop taking. She says it was a cholesterol medication and she thinks it was Atorvastatin , but not sure. Please advise.

## 2023-09-30 MED ORDER — CARVEDILOL 25 MG PO TABS: 12.5000 mg | ORAL_TABLET | Freq: Two times a day (BID) | ORAL | Status: DC

## 2023-09-30 NOTE — Telephone Encounter (Signed)
 Tried to call pt, pt has no voicemail set up. Will have to try again later. See if she already picked up the Coreg  (carvedilol  12.5mg  ). Make sure she knows to cut the 25mg  in 1/2 to take.

## 2023-09-30 NOTE — Telephone Encounter (Signed)
 If she is already not taking the Amlodipine , let's have her decrease her dose of Coreg  to 12.5mg  twice daily. We just sent in a refill of the 25mg  twice daily dosing. If she has already picked this up, she can just cut the tablets in half. If she has not picked up this prescription, can we send her in a new prescription with the 12.5mg  tablets?  Thank you! [name]

## 2023-09-30 NOTE — Addendum Note (Signed)
 Addended by: Dorthey Gave on: 09/30/2023 01:35 PM   Modules accepted: Orders

## 2023-10-05 ENCOUNTER — Other Ambulatory Visit: Payer: Self-pay

## 2023-10-06 ENCOUNTER — Other Ambulatory Visit: Payer: Self-pay

## 2023-10-07 ENCOUNTER — Other Ambulatory Visit (HOSPITAL_COMMUNITY): Payer: Self-pay

## 2023-10-07 ENCOUNTER — Other Ambulatory Visit: Payer: Self-pay

## 2023-10-07 MED ORDER — ATORVASTATIN CALCIUM 40 MG PO TABS
40.0000 mg | ORAL_TABLET | Freq: Every day | ORAL | 0 refills | Status: DC
  Filled 2023-10-07 (×2): qty 90, 90d supply, fill #0

## 2023-10-07 NOTE — Telephone Encounter (Signed)
 Pt advised and says she has only been taking the 12.5 mg BID.

## 2023-10-07 NOTE — Telephone Encounter (Signed)
 Patient is returning call. Please advise?

## 2023-10-07 NOTE — Addendum Note (Signed)
 Addended by: Alanna Alley on: 10/07/2023 11:59 AM   Modules accepted: Orders

## 2023-10-11 ENCOUNTER — Other Ambulatory Visit: Payer: Self-pay

## 2023-10-16 MED FILL — Sacubitril-Valsartan Tab 97-103 MG: ORAL | 30 days supply | Qty: 60 | Fill #1 | Status: AC

## 2023-10-18 ENCOUNTER — Other Ambulatory Visit: Payer: Self-pay

## 2023-10-20 ENCOUNTER — Ambulatory Visit (HOSPITAL_COMMUNITY)
Admission: RE | Admit: 2023-10-20 | Discharge: 2023-10-20 | Disposition: A | Discharge status: Home / Self Care | Source: Ambulatory Visit | Attending: Cardiovascular Disease | Admitting: Cardiovascular Disease

## 2023-10-20 DIAGNOSIS — I6523 Occlusion and stenosis of bilateral carotid arteries: Secondary | ICD-10-CM | POA: Diagnosis present

## 2023-10-25 ENCOUNTER — Other Ambulatory Visit: Payer: Self-pay

## 2023-11-09 ENCOUNTER — Other Ambulatory Visit: Payer: Self-pay

## 2023-11-09 ENCOUNTER — Other Ambulatory Visit (HOSPITAL_COMMUNITY): Payer: Self-pay

## 2023-11-09 ENCOUNTER — Other Ambulatory Visit: Payer: Self-pay | Admitting: Nurse Practitioner

## 2023-11-09 DIAGNOSIS — E118 Type 2 diabetes mellitus with unspecified complications: Secondary | ICD-10-CM

## 2023-11-09 MED ORDER — OZEMPIC (1 MG/DOSE) 4 MG/3ML ~~LOC~~ SOPN
1.0000 mg | PEN_INJECTOR | SUBCUTANEOUS | 1 refills | Status: DC
  Filled 2023-11-09: qty 3, 28d supply, fill #0

## 2023-11-17 ENCOUNTER — Other Ambulatory Visit: Payer: Self-pay

## 2023-11-26 ENCOUNTER — Telehealth: Payer: Self-pay | Admitting: Cardiovascular Disease

## 2023-11-26 ENCOUNTER — Other Ambulatory Visit: Payer: Self-pay

## 2023-11-26 MED ORDER — CARVEDILOL 25 MG PO TABS
12.5000 mg | ORAL_TABLET | Freq: Two times a day (BID) | ORAL | 3 refills | Status: AC
  Filled 2023-11-26: qty 90, 90d supply, fill #0
  Filled 2024-01-07 – 2024-02-25 (×3): qty 90, 90d supply, fill #1
  Filled 2024-05-25: qty 90, 90d supply, fill #2

## 2023-11-26 NOTE — Telephone Encounter (Signed)
*  STAT* If patient is at the pharmacy, call can be transferred to refill team.   1. Which medications need to be refilled? (please list name of each medication and dose if known)   carvedilol  (COREG ) 25 MG tablet   2. Would you like to learn more about the convenience, safety, & potential cost savings by using the [facility]?   3. Are you open to using the [facility] (Type [facility]. ).  4. Which pharmacy/location (including street and city if local pharmacy) is medication to be sent to?  [facility]   5. Do they need a 30 day or 90 day supply?   90 day  Patient stated she is completely out of this medication.

## 2023-11-26 NOTE — Telephone Encounter (Signed)
 Pt's medication was sent to pt's pharmacy as requested. Confirmation received.

## 2023-11-27 ENCOUNTER — Other Ambulatory Visit: Payer: Self-pay | Admitting: Cardiovascular Disease

## 2023-11-27 MED FILL — Sacubitril-Valsartan Tab 97-103 MG: ORAL | 30 days supply | Qty: 60 | Fill #2 | Status: AC

## 2023-11-29 ENCOUNTER — Other Ambulatory Visit: Payer: Self-pay

## 2023-11-30 ENCOUNTER — Other Ambulatory Visit: Payer: Self-pay

## 2023-11-30 MED ORDER — DAPAGLIFLOZIN PROPANEDIOL 10 MG PO TABS
10.0000 mg | ORAL_TABLET | Freq: Every day | ORAL | 2 refills | Status: AC
  Filled 2023-11-30: qty 90, 90d supply, fill #0
  Filled 2024-03-11: qty 90, 90d supply, fill #1
  Filled 2024-06-13: qty 30, 30d supply, fill #2

## 2023-12-19 ENCOUNTER — Telehealth: Payer: Self-pay | Admitting: Student in an Organized Health Care Education/Training Program

## 2023-12-19 NOTE — Telephone Encounter (Signed)
 Paged by operator. For a month she has had a rash on the back of her leg that she thought was mosquito bite. Friday AM she had some swelling around eyes, Saturday was worse. Took benadryl but ended up going to urgent care. They gave her medications at urgent care and told her that it was probably allergic rxn. Nothing that she was around or ate that she can recall that was a possible trigger. Heparin  only listed allergy. She did have rash on her hands 4 months ago but didn't seem serious. Currently still with mild puffiness around eyes but everything else has resolved. Dark under eyes from Saturday. No new medications. Reviewed meds and no obvious ones that would explain sx. Asked her to reach out to PCP tomorrow, may need to have derm referral and or allergy if this recurs/persists.

## 2023-12-20 ENCOUNTER — Other Ambulatory Visit: Payer: Self-pay

## 2023-12-20 ENCOUNTER — Ambulatory Visit: Attending: Family Medicine | Admitting: Family Medicine

## 2023-12-20 ENCOUNTER — Encounter: Payer: Self-pay | Admitting: Family Medicine

## 2023-12-20 ENCOUNTER — Other Ambulatory Visit: Payer: Self-pay | Admitting: Nurse Practitioner

## 2023-12-20 ENCOUNTER — Other Ambulatory Visit: Payer: Self-pay | Admitting: Family Medicine

## 2023-12-20 ENCOUNTER — Ambulatory Visit: Payer: Self-pay

## 2023-12-20 ENCOUNTER — Encounter: Payer: Self-pay | Admitting: Nurse Practitioner

## 2023-12-20 VITALS — BP 128/82 | HR 87 | Ht 63.0 in | Wt 143.6 lb

## 2023-12-20 DIAGNOSIS — E118 Type 2 diabetes mellitus with unspecified complications: Secondary | ICD-10-CM

## 2023-12-20 DIAGNOSIS — T7840XD Allergy, unspecified, subsequent encounter: Secondary | ICD-10-CM

## 2023-12-20 MED ORDER — OZEMPIC (0.25 OR 0.5 MG/DOSE) 2 MG/3ML ~~LOC~~ SOPN
0.5000 mg | PEN_INJECTOR | SUBCUTANEOUS | 1 refills | Status: DC
  Filled 2023-12-20: qty 3, 28d supply, fill #0
  Filled 2024-01-17: qty 3, 28d supply, fill #1

## 2023-12-20 NOTE — Progress Notes (Signed)
 Subjective:  Patient ID: [name], female    DOB: Sep 26, 1970  Age: 53 y.o. MRN: [id number]  CC: Rash (Rash under eyes requesting referral.)     Discussed the use of AI scribe software for clinical note transcription with the patient, who gave verbal consent to proceed.  History of Present Illness [name] is a 53 year old female patient of  Haze Servant FNP with a history of type 2 diabetes mellitus, hyperlipidemia, CAD status post CABG who presents with a rash and swelling around her eyes.  Over a month ago, she noticed a rash on the back of her leg with significant pruritus, initially thought to be mosquito bites. On Friday, she developed periorbital swelling, which worsened by Saturday, leading her to seek urgent care. Her eyes appear 'oiled up' and 'puffy,' with dark circles resembling 'black eyes.'  At urgent care, she was diagnosed with an allergic reaction and prescribed prednisone hydroxyzine the latter of which seems to worsen her pruritus. She denies recent changes in medication or known food allergies, although she recently consumed Asian shrimp and steak, which she hasn't had in a long time. She is unaware of any shrimp allergy.  She has an allergy to heparin  and has been on heart medications for five and a half months, which are not believed to be causing the reaction. She experiences generalized pruritus without shortness of breath or throat swelling.    Past Medical History:  Diagnosis Date   CAD (coronary artery disease)    cath 09/07/2014 DES to 95% prox RCA, residual 70-80% origin of AV groove, 90% ostial PDA stenosis; 01/2015, NSTEMI w/ CABG, LIMA-LAD, SVG-OM1, SVG-PCA   Carotid artery disease (HCC)    a. 40-59% BICA by duplex 01/2015.   Diabetes mellitus without complication (HCC)    H/O medication noncompliance    Hyperlipidemia    Hypertension    MI (myocardial infarction) (HCC)    Tobacco abuse     Past Surgical History:  Procedure  Laterality Date   ABDOMINAL HYSTERECTOMY  2008   ANTERIOR CERVICAL DECOMP/DISCECTOMY FUSION N/A 08/26/2021   Procedure: Anterior Cervical Decompression/Discectomy Fusion Cervical Six-Seven;  Surgeon: Lanis Pupa, MD;  Location: [facility] OR;  Service: Neurosurgery;  Laterality: N/A;  3C   CARDIAC CATHETERIZATION N/A 02/01/2015   Procedure: Left Heart Cath and Coronary Angiography;  Surgeon: Dorn JINNY Lesches, MD;  Location: [facility] INVASIVE CV LAB;  Service: Cardiovascular;  Laterality: N/A;   CARDIAC CATHETERIZATION N/A 02/07/2015   Procedure: IABP Insertion;  Surgeon: Toribio JONELLE Fuel, MD;  Location: [facility] INVASIVE CV LAB;  Service: Cardiovascular;  Laterality: N/A;   CORONARY ARTERY BYPASS GRAFT N/A 02/07/2015   Procedure: CORONARY ARTERY BYPASS GRAFTING (CABG);  Surgeon: Maude Fleeta Ochoa, MD; LIMA-LAD, SVT-OM1, SVG-PDA   LEFT HEART CATHETERIZATION WITH CORONARY ANGIOGRAM N/A 05/29/2014   Procedure: LEFT HEART CATHETERIZATION WITH CORONARY ANGIOGRAM;  Surgeon: Candyce GORMAN Reek, MD;  Location: [facility] CATH LAB;  Service: Cardiovascular;  Laterality: N/A;   LEFT HEART CATHETERIZATION WITH CORONARY ANGIOGRAM N/A 09/07/2014   Procedure: LEFT HEART CATHETERIZATION WITH CORONARY ANGIOGRAM;  Surgeon: Alm LELON Clay, MD;  Location: [facility] CATH LAB;  Service: Cardiovascular;  Laterality: N/A;   PERCUTANEOUS CORONARY STENT INTERVENTION (PCI-S)  05/29/2014   Procedure: PERCUTANEOUS CORONARY STENT INTERVENTION (PCI-S);  Surgeon: Candyce GORMAN Reek, MD;  Location: [facility] CATH LAB;  Service: Cardiovascular;;   TEE WITHOUT CARDIOVERSION N/A 02/07/2015   Procedure: TRANSESOPHAGEAL ECHOCARDIOGRAM (TEE);  Surgeon: Maude Fleeta Ochoa, MD;  Location: [facility] OR;  Service: Open Heart Surgery;  Laterality: N/A;    Family History  Problem Relation Age of Onset   Hypertension Mother    Kidney failure Mother    Stroke Mother 61   Heart attack Father 69   Heart disease Father    Breast cancer Sister     Social History    Socioeconomic History   Marital status: Single    Spouse name: Not on file   Number of children: 2   Years of education: Not on file   Highest education level: Not on file  Occupational History   Occupation: CNA    Employer: [employer]  Tobacco Use   Smoking status: Former    Current packs/day: 0.00    Types: Cigarettes    Quit date: 01/09/1993    Years since quitting: 30.9   Smokeless tobacco: Never  Vaping Use   Vaping status: Never Used  Substance and Sexual Activity   Alcohol  use: No    Alcohol /week: 0.0 standard drinks of alcohol    Drug use: No   Sexual activity: Not on file  Other Topics Concern   Not on file  Social History Narrative   Not on file   Social Drivers of Health   Financial Resource Strain: Patient Declined (03/03/2023)   Overall Financial Resource Strain (CARDIA)    Difficulty of Paying Living Expenses: Patient declined  Food Insecurity: No Food Insecurity (03/03/2023)   Hunger Vital Sign    Worried About Running Out of Food in the Last Year: Never true    Ran Out of Food in the Last Year: Never true  Transportation Needs: No Transportation Needs (03/03/2023)   PRAPARE - Administrator, Civil Service (Medical): No    Lack of Transportation (Non-Medical): No  Physical Activity: Inactive (03/03/2023)   Exercise Vital Sign    Days of Exercise per Week: 0 days    Minutes of Exercise per Session: 0 min  Stress: No Stress Concern Present (03/03/2023)   [employer] of Occupational Health - Occupational Stress Questionnaire    Feeling of Stress : Not at all  Social Connections: Socially Isolated (03/03/2023)   Social Connection and Isolation Panel    Frequency of Communication with Friends and Family: Once a week    Frequency of Social Gatherings with Friends and Family: Once a week    Attends Religious Services: Never    Database administrator or Organizations: No    Attends Banker Meetings: Never     Marital Status: Never married    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     aspirin  EC 81 MG tablet Take 1 tablet (81 mg total) by mouth daily. 30 tablet 11   atorvastatin  (LIPITOR ) 40 MG tablet Take 1 tablet (40 mg total) by mouth daily. 90 tablet 0   carvedilol  (COREG ) 25 MG tablet Take 0.5 tablets (12.5 mg total) by mouth 2 (two) times daily. 90 tablet 3   Continuous Glucose Receiver (DEXCOM G7 RECEIVER) DEVI Use to check blood sugar continuously throughout the day. Change sensors once every 10 days. 1 each 0   Continuous Glucose Sensor (DEXCOM G7 SENSOR) MISC  Use to check blood sugar continuously throughout the day. Change sensors once every 10 days. 3 each 6   dapagliflozin  propanediol (FARXIGA ) 10 MG TABS tablet Take 1 tablet (10 mg total) by mouth daily before breakfast. 90 tablet 2   metFORMIN  (GLUCOPHAGE ) 500 MG tablet Take 2 tablets (1,000 mg total) by mouth 2 (two) times daily with a meal. 360 tablet 1   nitroGLYCERIN  (NITROSTAT ) 0.4 MG SL tablet Place 1 tablet (0.4 mg total) under the tongue every 5 (five) minutes as needed for chest pain. 25 tablet 10   Omega-3 Fatty Acids (FISH OIL ) 1200 MG CAPS Take 1,200 mg by mouth at bedtime.     omeprazole  (PRILOSEC) 40 MG capsule TAKE 1 CAPSULE (40 MG TOTAL) BY MOUTH DAILY. 90 capsule 1   sacubitril -valsartan  (ENTRESTO ) 97-103 MG TAKE 1 TABLET BY MOUTH TWICE A DAY 60 tablet 11   Semaglutide , 1 MG/DOSE, (OZEMPIC , 1 MG/DOSE,) 4 MG/3ML SOPN Inject 1 mg as directed once a week. 3 mL 1   spironolactone  (ALDACTONE ) 25 MG tablet Take 1 tablet (25 mg total) by mouth daily. 90 tablet 3   No facility-administered medications prior to visit.     ROS Review  of Systems  Constitutional:  Negative for activity change and appetite change.  HENT:  Negative for sinus pressure and sore throat.   Respiratory:  Negative for chest tightness, shortness of breath and wheezing.   Cardiovascular:  Negative for chest pain and palpitations.  Gastrointestinal:  Negative for abdominal distention, abdominal pain and constipation.  Genitourinary: Negative.   Musculoskeletal: Negative.   Skin:  Positive for rash.  Psychiatric/Behavioral:  Negative for behavioral problems and dysphoric mood.     Objective:  BP 128/82   Pulse 87   Ht 5' 3 (1.6 m)   Wt 143 lb 9.6 oz (65.1 kg)   SpO2 99%   BMI 25.44 kg/m      12/20/2023    1:43 PM 09/29/2023   10:38 AM 08/31/2023    1:49 PM  BP/Weight  Systolic BP 128 102 127  Diastolic BP 82 72 76  Wt. (Lbs) 143.6 146 165.8  BMI 25.44 kg/m2 25.86 kg/m2 29.37 kg/m2      Physical Exam Constitutional:      Appearance: She is well-developed.  HENT:     Mouth/Throat:     Mouth: Mucous membranes are moist.  Eyes:     Comments: Periorbital hyperpigmentation bilaterally  Cardiovascular:     Rate and Rhythm: Normal rate.     Heart sounds: Normal heart sounds. No murmur heard. Pulmonary:     Effort: Pulmonary effort is normal.     Breath sounds: Normal breath sounds. No wheezing or rales.  Chest:     Chest wall: No tenderness.  Abdominal:     General: Bowel sounds are normal. There is no distension.     Palpations: Abdomen is soft. There is no mass.     Tenderness: There is no abdominal tenderness.  Musculoskeletal:        General: Normal range of motion.     Right lower leg: No edema.     Left lower leg: No edema.  Skin:    Comments: Hyperpigmented papular rash on lower extremities.  No rash on trunk or upper extremity  Neurological:     Mental Status: She is alert and oriented to person, place, and time.  Psychiatric:        Mood and Affect: Mood normal.  Latest Ref Rng & Units 08/31/2023    11:06 AM 02/01/2023    4:16 PM 10/30/2022    9:35 AM  CMP  Glucose 70 - 99 mg/dL 869  875  796   BUN 6 - 24 mg/dL 17  18  14    Creatinine 0.57 - 1.00 mg/dL 9.07  9.06  9.13   Sodium 134 - 144 mmol/L 141  142  139   Potassium 3.5 - 5.2 mmol/L 4.9  4.6  4.6   Chloride 96 - 106 mmol/L 106  104  103   CO2 20 - 29 mmol/L 20  20  20    Calcium  8.7 - 10.2 mg/dL 9.2  9.5  9.6   Total Protein 6.0 - 8.5 g/dL   6.4   Total Bilirubin 0.0 - 1.2 mg/dL   0.2   Alkaline Phos 44 - 121 IU/L   78   AST 0 - 40 IU/L   17   ALT 0 - 32 IU/L   21     Lipid Panel     Component Value Date/Time   CHOL 96 (L) 10/30/2022 0935   TRIG 128 10/30/2022 0935   HDL 37 (L) 10/30/2022 0935   CHOLHDL 2.6 10/30/2022 0935   CHOLHDL 3.0 02/03/2015 0235   VLDL 18 02/03/2015 0235   LDLCALC 36 10/30/2022 0935    CBC    Component Value Date/Time   WBC 5.8 07/24/2022 1005   WBC 7.4 08/20/2021 1441   RBC 4.25 07/24/2022 1005   RBC 4.20 08/20/2021 1441   HGB 12.8 07/24/2022 1005   HCT 38.1 07/24/2022 1005   PLT 196 07/24/2022 1005   MCV 90 07/24/2022 1005   MCH 30.1 07/24/2022 1005   MCH 29.8 08/20/2021 1441   MCHC 33.6 07/24/2022 1005   MCHC 32.1 08/20/2021 1441   RDW 13.9 07/24/2022 1005   LYMPHSABS 1.6 07/24/2022 1005   MONOABS 0.3 09/06/2014 1656   EOSABS 0.2 07/24/2022 1005   BASOSABS 0.0 07/24/2022 1005    Lab Results  Component Value Date   HGBA1C 5.9 08/31/2023       Assessment & Plan Allergic reaction with periorbital swelling and generalized pruritus She has not had any introduction of new medications over the last 1 months Suspected food-related allergic reaction,  - Continue prednisone to manage symptoms. -No respiratory symptoms - Refer to allergist for comprehensive allergy testing. - Advise urgent care if shortness of breath or throat closing occurs.        No orders of the defined types were placed in this encounter.   Follow-up: Return for previously scheduled appointment.   With PCP for management of chronic medical conditions      Corrina Sabin, MD, FAAFP. [facility] [place], [place] [phone]   12/20/2023, 2:03 PM

## 2023-12-20 NOTE — Telephone Encounter (Signed)
 FYI Only or Action Required?: FYI only for provider.  Patient was last seen in primary care on 08/31/2023 by Theotis Haze ORN, NP.  Called Nurse Triage reporting No chief complaint on file..  Symptoms began several days ago.  Interventions attempted: Prescription medications: UC rx 6 day course of prednisone and hydroxyzine..  Symptoms are: unchanged.  Triage Disposition: No disposition on file.  Patient/caregiver understands and will follow disposition?:   Answer Assessment - Initial Assessment Questions 1. APPEARANCE of RASH: What does the rash look like? (e.g., blisters, dry flaky skin, red spots, redness, sores)     Red  3. LOCATION: Where is the rash located?     Both legs, face, hands  4. COLOR: What color is the rash? (Note: It is difficult to assess rash color in people with darker-colored skin. When this situation occurs, simply ask the caller to describe what they see.)     Red  5. ONSET: When did the rash begin?     1 month ago  6. FEVER: Do you have a fever? If Yes, ask: What is your temperature, how was it measured, and when did it start?     Denies  7. ITCHING: Does the rash itch? If Yes, ask: How bad is the itch? (Scale 1-10; or mild, moderate, severe)     Moderate  8. CAUSE: What do you think is causing the rash?     Unknown  Protocols used: Rash or Redness - Widespread-A-AH Copied from CRM [id number]. Topic: Clinical - Red Word Triage >> Dec 20, 2023 10:20 AM Charlet HERO wrote: Red Word that prompted transfer to Nurse Triage: Pateint is calling about a rash that she has and she went to urgent care and they informed her that she needs to speak with her pcp the rash is on her eyes they were swollen they have went down but still have them looking like they are blackened, back of legs she is stating that she does not know what caused the reaction she would like to get a referral for dermatologist.

## 2023-12-20 NOTE — Progress Notes (Signed)
 Phone call made to [name]   She has lost a considerable amount of weight since she has been started on Ozempic .  Currently with a 2 mg she is experiencing lows again reduction in appetite and is also unhappy with her weight loss.  She would like to decrease her dose back to 0.5 mg weekly  Wt Readings from Last 3 Encounters:  12/20/23 143 lb 9.6 oz (65.1 kg)  09/29/23 146 lb (66.2 kg)  08/31/23 165 lb 12.8 oz (75.2 kg)

## 2023-12-20 NOTE — Telephone Encounter (Signed)
 FYI Only or Action Required?: Action required by provider: clinical question for provider.  Patient was last seen in primary care on 12/20/2023 by Newlin, Enobong, MD.  Called Nurse Triage reporting Medication Question.  Symptoms began several weeks ago.  Interventions attempted: Prescription medications: Ozempic  1mg .  Symptoms are: stable.  Triage Disposition: Call PCP Within 24 Hours  Patient/caregiver understands and will follow disposition?: No, wishes to speak with PCP  **See note below**              Copied from CRM #[id number]. Topic: Clinical - Medication Question >> Dec 20, 2023  2:36 PM Yolanda T wrote: Reason for CRM: patient called stated she is losing too much weight on the Semaglutide , 1 MG/DOSE, (OZEMPIC , 1 MG/DOSE,) 4 MG/3ML SOPN. She says she doesn't have an appetite to eat and request to go back to 0.5mg . Please f/u with patient Reason for Disposition  [1] Caller requests to speak ONLY to PCP AND [2] NON-URGENT question  Answer Assessment - Initial Assessment Questions 1. REASON FOR CALL or QUESTION: What is your reason for calling today? or How can I best   Patient called stated she is losing too much weight on the Semaglutide , 1 MG/DOSE, (OZEMPIC , 1 MG/DOSE,) 4 MG/3ML SOPN. She stated she doesn't have an appetite to eat and request to go back to 0.5mg . She also has a refill coming up but wants the dosage changed first.  Protocols used: PCP Call - No Triage-A-AH

## 2023-12-20 NOTE — Patient Instructions (Signed)
 VISIT SUMMARY:  Today, you were seen for a rash and swelling around your eyes. You also reported significant itching and recent weight loss.  YOUR PLAN:  -ALLERGIC REACTION WITH PERIORBITAL SWELLING AND GENERALIZED PRURITUS: You have an allergic reaction that is causing swelling around your eyes and itching all over your body. This may be related to the recent consumption of Asian shrimp and steak. You should continue taking prednisone to manage your symptoms. We will refer you to an allergist for comprehensive allergy testing. If you experience shortness of breath or your throat starts to close, seek urgent care immediately.  -UNINTENTIONAL WEIGHT LOSS: You have experienced significant weight loss and a lack of appetite, which may be related to your use of Ozempic . You should send a message to your primary doctor to discuss your concerns about continuing this medication.  INSTRUCTIONS:  Please follow up with an allergist for comprehensive allergy testing. If you experience shortness of breath or throat closing, seek urgent care immediately. We will also contact your primary doctor regarding your concerns about Ozempic  and weight loss.

## 2023-12-21 ENCOUNTER — Other Ambulatory Visit: Payer: Self-pay

## 2023-12-21 MED ORDER — OMEPRAZOLE 40 MG PO CPDR
40.0000 mg | DELAYED_RELEASE_CAPSULE | Freq: Every day | ORAL | 1 refills | Status: AC
  Filled 2023-12-21: qty 90, 90d supply, fill #0
  Filled 2024-03-16: qty 90, 90d supply, fill #1

## 2023-12-22 ENCOUNTER — Other Ambulatory Visit: Payer: Self-pay

## 2023-12-22 ENCOUNTER — Inpatient Hospital Stay (HOSPITAL_COMMUNITY): Admission: RE | Admit: 2023-12-22 | Source: Ambulatory Visit

## 2023-12-25 NOTE — Progress Notes (Signed)
 Cardiology Office Note:    Date:  01/04/2024   ID:  [name], DOB 11-09-1970, MRN [id number]  PCP:  [name] Haze ORN, NP  Cardiologist:  [name] Lesches, MD     Referring MD: [name] Haze ORN, NP   Chief Complaint: follow-up of dyspnea on exertion and chest tightness   History of Present Illness:    [name] is a 53 y.o. female with a history of CAD s/p multiple interventions (DES to proximal LCX and balloon angiplasty to mid LCX as well as DES to RCA in 05/2014 in setting of NSTEMI, DES to RCA in 08/2014, and  then CABG x3 (LIMA-LAD, SVG-OM,SVG-PDA) in 01/2015), ischemic cardiomyopathy / chronic HFrEF with EF of 30%, bilateral carotid stenosis, hypertension, hyperlipidemia, type 2 diabetes mellitus, medication non-compliance, and tobacco abuse who is followed by Dr. Lesches and presents today for follow-up of dyspnea on exertion and chest tightness.      CAD - Admitted in 05/2014 for NSTEMI. LHC showed multivessel disease. S/p DES to proximal LCX and balloon angioplasty to mid LCX as well as DES to RCA. Echo showed LVEF of 55-60%.  - Admitted in 08/2014 for unstable angina. LHC showed 90% stenosis of proximal RCA s/p DES. - Admitted in 01/2015 again for unstable angina. LHC showed patent stents to the proximal to mid RCA but 95% stenosis of distal RCA, 99% stenosis of ostial LCX, and 70% stenosis of proximal LAD. S/p CABG x3 with LIMA-LAD, SVG-OM1, SVG-PDA. Post-op course complicated by PEA arrest, ventricular tachycardia, and cardiogenic shock requiring placement of intra-aortic balloon pump and inotropic support.  - Myoview  02/2018 showed was high risk with large defect in the basal inferior, basal inferolateral, mid inferior, mid inferolateral, and apical inferior location consistent with prior infarct but not ischemia.   Ischemic Cardiomyopathy Chronic HFrEF - EF was normal at 55-60% during admission for NSTEMI in 05/2014.  - EF mildly low at 45-50% in 02/2014 after CABG.  -  EF has slowly been down-trending since 2019. Last Echo in 09/2023 showed LVEF of 30% with akinesis of the entire lateral wall and apical inferior segment and hypokinesis of the entire wall, entire septum, and inferior wall as well as grade 1 diastolic dysfunction. Normal RV size and function.  Carotid Stenosis - First diagnosis on pre-CABG dopplers in 2016. Found to have 40-59% stenosis of bilateral ICAs at that time.  - Repeat dopplers in 01/2017 and 01/2018 showed on ly 1-39% stenosis of bilateral ICAs.  - Last dopplers in 10/2023 showed 40-59% stenosis of right ICA, 1-39% stenosis of left ICA, antegrade flow through bilateral vertebral arteries, and normal flow hemodynamics in bilateral subclavian arteries.       Patient is primarily followed by Cardiology for CAD and CHF as detailed above. She had multiple PCIs and then a CABG x3 in 2016. Since 2019, EF has been slowly down trending. Last Echo in 09/2023 showed LVEF of 30% with multiple wall motion abnormalities. She was last seen by me in 09/2023 at which time she reported dyspnea on exertion and chest tightness when she over-exerts herself. Cardiac PET stress test was ordered but has not been completed yet. Currently scheduled for 01/12/2024. Coreg  was stopped at that visit given soft BP.   Patient presents today for follow-up.  She continues to have dyspnea on exertion when she overexerts herself but denies any chest pain/discomfort.  She has had a couple episodes of left arm heaviness over the last 3 days and will last for  about 1 minute at a time and go away.  She also describes a jerking sensation of her arm like she is having a muscle spasm during these episodes.  No shortness of breath at rest.  No orthopnea, PND, or edema.  She reports some occasional dizziness with position changes that she describes as the room spinning.  She noticed some slight improvement with this No associated palpitations with this.  No falls or syncope.  She states she was  seen in an Urgent Care 2 weeks ago for an allergic reaction (swollen eyes and itching). She is not sure what caused this. She has been referred to an Allergist and is scheduled to see them next week.   EKGs/Labs/Other Studies Reviewed:    The following studies were reviewed:  Left Cardiac Catheterization 02/01/2015: Ost RPDA to RPDA lesion, 95% stenosed. Ost Cx to Prox Cx lesion, 99% stenosed. Ost LAD to Prox LAD lesion, 70% stenosed. There is moderate left ventricular systolic dysfunction.   Impressions: Mr. Nale has RCA stents that are widely patent. She does have a 95% ostial PDA lesion in a small caliber vessel. Her ostial circumflex has progressed now to 99% off of the left main. Her ostial/proximal LAD is 50-70% stenosis segmentally. The LV function has decreased since her last cath now in the 35-45% range with a severe lateral wall motion abnormality in the distribution of the circumflex. Percutaneous intervention of the circumflex is high risk given the acuity of takeoff as well as the proximity to the left main originating from a diseased portion of the vessel. Other options include coronary artery bypass grafting. The sheath was removed and pressure held on the groin to achieve hemostasis. The patient did have a moderate size hematoma because of significant hypertension requiring IV hydralazine  and nitroglycerin . Presently restart heparin  6 hours after sheath pull without a bolus and to have TCTS evaluate.  _______________   Myoview  03/08/2018: The left ventricular ejection fraction is moderately decreased (30-44%). Nuclear stress EF: 31%. There is inferoseptal and inferolateral wall akinesis. There was no ST segment deviation noted during stress. Defect 1: There is a large defect of severe severity present in the basal inferior, basal inferolateral, mid inferior, mid inferolateral and apical inferior location. This represents old infarct pattern. This is a high risk study. Findings  consistent with prior myocardial infarction. There is no significant ischemia identified. _______________   Echocardiogram 09/17/2023: Impressions: 1. Left ventricular ejection fraction, by estimation, is 30%. Left  ventricular ejection fraction by 3D volume is 29 %. The left ventricle has  severely decreased function. The left ventricle demonstrates regional wall  motion abnormalities (see scoring  diagram/findings for description). Left ventricular diastolic parameters  are consistent with Grade I diastolic dysfunction (impaired relaxation).  No LV thrombus.   2. Right ventricular systolic function is normal. The right ventricular  size is normal. There is normal pulmonary artery systolic pressure.   3. Diminished left atrial reservoir strain; 13.8%.   4. The mitral valve is normal in structure. Trivial mitral valve  regurgitation. No evidence of mitral stenosis. The mean mitral valve  gradient is 1.3 mmHg with average heart rate of 76 bpm.   5. The aortic valve is tricuspid. Aortic valve regurgitation is not  visualized. No aortic stenosis is present.   6. The inferior vena cava is normal in size with greater than 50%  respiratory variability, suggesting right atrial pressure of 3 mmHg.   Comparison(s): No significant change from prior study. Prior images  reviewed  side by side.  _______________  Carotid Dopplers 10/20/2023: Summary: - Right Carotid: Velocities in the right ICA are consistent with a 40-59% stenosis. The ECA appears <50% stenosed.  - Left Carotid: Velocities in the left ICA are consistent with a 1-39% stenosis.  - Vertebrals: Bilateral vertebral arteries demonstrate antegrade flow.  - Subclavians: Normal flow hemodynamics were seen in bilateral subclavian arteries.    EKG:  EKG not ordered today.   Recent Labs: 08/31/2023: BUN 17; Creatinine, Ser 0.92; Potassium 4.9; Sodium 141  Recent Lipid Panel    Component Value Date/Time   CHOL 96 (L) 10/30/2022 0935    TRIG 128 10/30/2022 0935   HDL 37 (L) 10/30/2022 0935   CHOLHDL 2.6 10/30/2022 0935   CHOLHDL 3.0 02/03/2015 0235   VLDL 18 02/03/2015 0235   LDLCALC 36 10/30/2022 0935    Physical Exam:    Vital Signs: BP 112/66   Pulse 89   Ht 5' 3 (1.6 m)   Wt 142 lb 12.8 oz (64.8 kg)   SpO2 98%   BMI 25.30 kg/m     Orthostatic VS for the past 24 hrs (Last 3 readings):  BP- Lying Pulse- Lying BP- Sitting Pulse- Sitting BP- Standing at 0 minutes Pulse- Standing at 0 minutes BP- Standing at 3 minutes Pulse- Standing at 3 minutes  01/04/24 1151 96/61 74 94/62 79 (!) 85/63 88 93/62 82     Wt Readings from Last 3 Encounters:  01/04/24 142 lb 12.8 oz (64.8 kg)  12/20/23 143 lb 9.6 oz (65.1 kg)  09/29/23 146 lb (66.2 kg)     General: 53 y.o. African-American female in no acute distress. HEENT: Normocephalic and atraumatic. Sclera clear.  Neck: Supple. No carotid bruits. No JVD. Heart: RRR. Distinct S1 and S2. No murmurs, gallops, or rubs.  Lungs: No increased work of breathing. Clear to ausculation bilaterally. No wheezes, rhonchi, or rales.  Extremities: No lower extremity edema.  Skin: Warm and dry. Neuro: No focal deficits. Psych: Normal affect. Responds appropriately.  Assessment:    1. Coronary artery disease involving native coronary artery of native heart without angina pectoris   2. S/P CABG (coronary artery bypass graft)   3. Ischemic cardiomyopathy   4. Bilateral carotid artery stenosis   5. Primary hypertension   6. Dizziness   7. Hyperlipidemia, unspecified hyperlipidemia type   8. Type 2 diabetes mellitus with complication, without long-term current use of insulin  (HCC)     Plan:    CAD s/p CABG History of multiple PCI in early 2016 and then CABG x3 in 01/2015. Myoview  in 02/2018 showed a large defect in the basal inferior, basal inferolateral, mid inferior, mid inferolateral, and apical inferior location consistent with prior infarct but not ischemia. She reported  dyspnea on exertion and chest tightness when overexerting herself at last visit in 09/2023. Cardiac PET stress test was ordered and is scheduled for 01/12/2024.  - She continues to have dyspnea on exertion but denies any chest pain/ discomfort. She has had a couple of brief episodes of left arm heaviness though over the last couple of days. - Continue Aspirin  81mg  daily and Lipitor  40mg  daily.  - Continue with plans for cardiac PET stress test next week.  Ischemic Cardiomyopathy Chronic HFrEF EF has gradually declined over the last few years. Recent Echo on 09/17/2023 showed LVEF of 30% with akinesis of the entire lateral wall and apical inferior segment and hypokinesis of the entire wall, entire septum, and inferior wall.  - Euvolemic on  exam.  - Will decrease Entresto  to 49-51mg  twice daily given soft BP and reports of dizziness.  - Continue Coreg  12.5mg  twice daily. Provided refill of this today. - Continue Spironolactone  25mg  daily.  - Continue Farxiga  10mg  daily.  - At last visit, I recommended referral to EP for consideration of ICD for primary prevention of sudden cardiac death given persistently low EF. However, patient wanted wait for results of cardiac PET stress test.    Bilateral Carotid Stenosis Recent carotid dopplers in 10/2023 showed 40-59% stenosis of right ICA and 1-39% stenosis of left ICA - Continue Aspirin  and Lipitor  as above.  - Plan is for repeat dopplers in 10/2024.    Hypertension Dizziness History of hypertension but BP low in clinic. Initial BP 112/66. However, it was lower when we went to check orthostatics at the end of visit and ranged from 85-96/61-53. Did not technically meet criteria for orthostatic hypotension but patient does report some intermittent dizziness at home with position changes which I suspect is due to low BP. - Will treat in context of CHF. Will decrease Entresto  as above. - Asked patient to keep a BP/ HR log for 2 weeks and then send this to us  via  MyChart.   Hyperlipidemia Lipid panel in 10/2022: Total Cholesterol 96, Triglycerides 128, HDL 37, LDL 36. LDL goal <55 given CAD.  - Continue Lipitor  40mg  daily.  - She is overdue for labs. Did not have time to discuss today. Can discuss at next visit.    Type 2 Diabetes Mellitus Hemoglobin A1c 5.9% in 08/2023.  - On Metformin  and Ozempic .  - Management per PCP.  Disposition: Follow up in 2-3 months (if cardiac PET stress test is high risk, will need to be seen sooner).   Signed, Aline FORBES Door, PA-C  01/04/2024 12:40 PM    [facility]

## 2024-01-04 ENCOUNTER — Other Ambulatory Visit: Payer: Self-pay

## 2024-01-04 ENCOUNTER — Ambulatory Visit: Attending: Student | Admitting: Student

## 2024-01-04 ENCOUNTER — Encounter: Payer: Self-pay | Admitting: Student

## 2024-01-04 ENCOUNTER — Other Ambulatory Visit (HOSPITAL_COMMUNITY): Payer: Self-pay

## 2024-01-04 VITALS — BP 112/66 | HR 89 | Ht 63.0 in | Wt 142.8 lb

## 2024-01-04 DIAGNOSIS — I2510 Atherosclerotic heart disease of native coronary artery without angina pectoris: Secondary | ICD-10-CM | POA: Insufficient documentation

## 2024-01-04 DIAGNOSIS — I255 Ischemic cardiomyopathy: Secondary | ICD-10-CM | POA: Diagnosis present

## 2024-01-04 DIAGNOSIS — E118 Type 2 diabetes mellitus with unspecified complications: Secondary | ICD-10-CM | POA: Insufficient documentation

## 2024-01-04 DIAGNOSIS — E785 Hyperlipidemia, unspecified: Secondary | ICD-10-CM | POA: Insufficient documentation

## 2024-01-04 DIAGNOSIS — Z951 Presence of aortocoronary bypass graft: Secondary | ICD-10-CM | POA: Diagnosis present

## 2024-01-04 DIAGNOSIS — I6523 Occlusion and stenosis of bilateral carotid arteries: Secondary | ICD-10-CM | POA: Diagnosis not present

## 2024-01-04 DIAGNOSIS — R42 Dizziness and giddiness: Secondary | ICD-10-CM | POA: Insufficient documentation

## 2024-01-04 DIAGNOSIS — I10 Essential (primary) hypertension: Secondary | ICD-10-CM | POA: Insufficient documentation

## 2024-01-04 MED ORDER — SACUBITRIL-VALSARTAN 49-51 MG PO TABS
1.0000 | ORAL_TABLET | Freq: Two times a day (BID) | ORAL | 2 refills | Status: DC
  Filled 2024-01-04: qty 60, 30d supply, fill #0
  Filled 2024-02-11: qty 60, 30d supply, fill #1
  Filled 2024-03-16: qty 60, 30d supply, fill #2

## 2024-01-04 MED ORDER — SACUBITRIL-VALSARTAN 49-51 MG PO TABS: 1.0000 | ORAL_TABLET | Freq: Two times a day (BID) | ORAL | Status: DC

## 2024-01-04 NOTE — Patient Instructions (Addendum)
 Medication Instructions:  Stop the Entresto  97-103mg   Start Entresto  49-51mg . Take one tablet twice daily.  Monitor your blood pressure daily for 2 weeks. Upload the blood pressure log to your mychart.  *If you need a refill on your cardiac medications before your next appointment, please call your pharmacy*   Lab Work: No labs were ordered during today's visit.  If you have labs (blood work) drawn today and your tests are completely normal, you will receive your results only by: MyChart Message (if you have MyChart) OR A paper copy in the mail If you have any lab test that is abnormal or we need to change your treatment, we will call you to review the results.   Testing/Procedures: No procedures were ordered during today's visit.    Follow-Up: At [facility], you and your health needs are our priority.  As part of our continuing mission to provide you with exceptional heart care, we have created designated Provider Care Teams.  These Care Teams include your primary Cardiologist (physician) and Advanced Practice Providers (APPs -  Physician Assistants and Nurse Practitioners) who all work together to provide you with the care you need, when you need it.  We recommend signing up for the patient portal called MyChart.  Sign up information is provided on this After Visit Summary.  MyChart is used to connect with patients for Virtual Visits (Telemedicine).  Patients are able to view lab/test results, encounter notes, upcoming appointments, etc.  Non-urgent messages can be sent to your provider as well.   To learn more about what you can do with MyChart, go to [email or web address].    Your next appointment:   2-3 month(s)  Provider:   Callie Goodrich, PA-C          Other Instructions Thank you for choosing [facility]!

## 2024-01-07 ENCOUNTER — Other Ambulatory Visit: Payer: Self-pay | Admitting: Cardiovascular Disease

## 2024-01-08 ENCOUNTER — Other Ambulatory Visit: Payer: Self-pay

## 2024-01-08 ENCOUNTER — Encounter (HOSPITAL_COMMUNITY): Payer: Self-pay

## 2024-01-11 ENCOUNTER — Other Ambulatory Visit: Payer: Self-pay

## 2024-01-11 MED ORDER — ATORVASTATIN CALCIUM 40 MG PO TABS
40.0000 mg | ORAL_TABLET | Freq: Every day | ORAL | 3 refills | Status: AC
  Filled 2024-01-11: qty 90, 90d supply, fill #0
  Filled 2024-04-12: qty 90, 90d supply, fill #1

## 2024-01-12 ENCOUNTER — Ambulatory Visit (HOSPITAL_COMMUNITY)
Admission: RE | Admit: 2024-01-12 | Discharge: 2024-01-12 | Disposition: A | Discharge status: Home / Self Care | Source: Ambulatory Visit | Attending: Student | Admitting: Student

## 2024-01-12 DIAGNOSIS — R0789 Other chest pain: Secondary | ICD-10-CM | POA: Diagnosis present

## 2024-01-12 LAB — NM PET CT CARDIAC PERFUSION MULTI W/ABSOLUTE BLOODFLOW
LV dias vol: 133 mL (ref 46–106)
LV sys vol: 83 mL (ref 3.8–5.2)
MBFR: 1.6
Nuc Rest EF: 38 %
Nuc Stress EF: 32 %
Peak HR: 107 {beats}/min
Rest HR: 78 {beats}/min
Rest MBF: 0.7 ml/g/min
Rest Nuclear Isotope Dose: 16.9 mCi
Rest perfusion cavity size (mL): 133 mL
ST Depression (mm): 0 mm
Stress MBF: 1.12 ml/g/min
Stress Nuclear Isotope Dose: 16.7 mCi
Stress perfusion cavity size (mL): 170 mL
TID: 1.28

## 2024-01-12 MED ORDER — RUBIDIUM RB82 GENERATOR (RUBYFILL)
16.9300 | PACK | Freq: Once | INTRAVENOUS | Status: AC
  Administered 2024-01-12: 16.93 via INTRAVENOUS

## 2024-01-12 MED ORDER — REGADENOSON 0.4 MG/5ML IV SOLN
INTRAVENOUS | Status: AC
Start: 2024-01-12 — End: 2024-01-12
  Filled 2024-01-12: qty 5

## 2024-01-12 MED ORDER — RUBIDIUM RB82 GENERATOR (RUBYFILL)
16.7400 | PACK | Freq: Once | INTRAVENOUS | Status: AC
Start: 2024-01-12 — End: 2024-01-12
  Administered 2024-01-12: 16.74 via INTRAVENOUS

## 2024-01-12 MED ORDER — REGADENOSON 0.4 MG/5ML IV SOLN
0.4000 mg | Freq: Once | INTRAVENOUS | Status: AC
  Administered 2024-01-12: 0.4 mg via INTRAVENOUS

## 2024-01-12 NOTE — Progress Notes (Signed)
 I called Mrs. Prescott to tell her the results of the PET scan were abnormal and that we are likely going to recommend a repeat cardiac catheterization.  I reviewed her recent symptoms and they appear to be all exertion-related.  She does not have angina or dyspnea at rest.  The symptoms occur if she overexerts.  She has not needed to take any nitroglycerin  recently, since simply resting for a couple of minutes has allowed the symptoms to resolve.  She does have sublingual nitroglycerin  available and knows that if she has chest discomfort lasting for more than 20-25 minutes, not relieved by 3 consecutive sublingual nitroglycerin  tablets, she should call 911 and come immediately to the emergency room. I also let Dr. Court know the results of the test, but he is not available in the office until next week.  Tomorrow, will try to organize an office visit for Mrs. Aultman this week to set her up for coronary angiography in the near future.

## 2024-01-13 ENCOUNTER — Telehealth: Payer: Self-pay | Admitting: Cardiovascular Disease

## 2024-01-13 ENCOUNTER — Telehealth: Payer: Self-pay

## 2024-01-13 ENCOUNTER — Ambulatory Visit: Payer: Self-pay | Admitting: Cardiology

## 2024-01-13 ENCOUNTER — Other Ambulatory Visit: Payer: Self-pay

## 2024-01-13 MED ORDER — CARVEDILOL 25 MG PO TABS
12.5000 mg | ORAL_TABLET | Freq: Two times a day (BID) | ORAL | 0 refills | Status: DC
  Filled 2024-01-13: qty 45, 45d supply, fill #0

## 2024-01-13 NOTE — Telephone Encounter (Signed)
 Progress Notes by Francyne Headland, MD at 01/12/2024  1:00 PM  Author: Francyne Headland, MD Service: Cardiology Author Type: Physician  Filed: 01/12/2024  6:45 PM Date of Service: 01/12/2024  1:00 PM Note Type: Progress Notes  Status: Signed Editor: Francyne Headland, MD (Physician)  I called [name] to tell her the results of the PET scan were abnormal and that we are likely going to recommend a repeat cardiac catheterization.  I reviewed her recent symptoms and they appear to be all exertion-related.  She does not have angina or dyspnea at rest.  The symptoms occur if she overexerts.  She has not needed to take any nitroglycerin  recently, since simply resting for a couple of minutes has allowed the symptoms to resolve.  She does have sublingual nitroglycerin  available and knows that if she has chest discomfort lasting for more than 20-25 minutes, not relieved by 3 consecutive sublingual nitroglycerin  tablets, she should call 911 and come immediately to the emergency room. I also let Dr. Court know the results of the test, but he is not available in the office until next week.  Tomorrow, will try to organize an office visit for [name] this week to set her up for coronary angiography in the near future.     Spoke with [name] regarding a follow up office visit to discuss abnormal Cardiac Stress PET. [name]'s next day office is Friday, 9/12. Able to schedule [name] to see [name] Louis, NP at 8:25am. [name] verbalizes understanding.

## 2024-01-13 NOTE — Telephone Encounter (Signed)
 Called and spoke to pt. She had been taking the Carvedilol  incorrectly, when filled on 11/26/23. She thought it was a 12.5 mg tablet, not a 25 mg tablet. She had been feeling more tired, and lightheaded and then realized the cause. She is back on 12.5 mg twice daily, starting today.   A 45 day RX was sent to her preferred pharmacy; 25 mg tablet was sent as to not confuse pt again:    [facility] at:  [address], [address]  [place] [address]  Near the intersection of: [facility]   Patient agrees to pay $9-15 out of pocket for medication. Pharmacy was contacted to ask/verify.

## 2024-01-13 NOTE — Telephone Encounter (Signed)
 Pt c/o medication issue:  1. Name of Medication: carvedilol  (COREG ) 25 MG tablet   2. How are you currently taking this medication (dosage and times per day)? Take 0.5 tablets (12.5 mg total) by mouth 2 (two) times daily.   3. Are you having a reaction (difficulty breathing--STAT)? no  4. What is your medication issue? Patient was taking medication incorrectly. She was taking a whole tablet instead of half tablet 2 times daily. So patient only have two pills left. Pharmacy states if knew prescription for whole tablet 2 times daily, the insurance will pay. Or send a 45 day prescription for current directions and patient would have to pay between $9-15. Please advise

## 2024-01-14 ENCOUNTER — Other Ambulatory Visit: Payer: Self-pay

## 2024-01-19 ENCOUNTER — Encounter: Payer: Self-pay | Admitting: *Deleted

## 2024-01-21 ENCOUNTER — Other Ambulatory Visit: Payer: Self-pay

## 2024-01-21 ENCOUNTER — Encounter: Payer: Self-pay | Admitting: Emergency Medicine

## 2024-01-21 ENCOUNTER — Encounter: Payer: Self-pay | Admitting: Allergy

## 2024-01-21 ENCOUNTER — Ambulatory Visit: Payer: Self-pay | Admitting: Allergy

## 2024-01-21 ENCOUNTER — Ambulatory Visit: Attending: Emergency Medicine | Admitting: Emergency Medicine

## 2024-01-21 VITALS — BP 90/60 | HR 99 | Temp 98.2°F | Ht 62.0 in | Wt 139.4 lb

## 2024-01-21 VITALS — BP 95/64 | HR 84 | Ht 63.0 in | Wt 140.0 lb

## 2024-01-21 DIAGNOSIS — I255 Ischemic cardiomyopathy: Secondary | ICD-10-CM | POA: Diagnosis present

## 2024-01-21 DIAGNOSIS — L299 Pruritus, unspecified: Secondary | ICD-10-CM | POA: Diagnosis not present

## 2024-01-21 DIAGNOSIS — R0609 Other forms of dyspnea: Secondary | ICD-10-CM | POA: Insufficient documentation

## 2024-01-21 DIAGNOSIS — Z951 Presence of aortocoronary bypass graft: Secondary | ICD-10-CM | POA: Insufficient documentation

## 2024-01-21 DIAGNOSIS — E782 Mixed hyperlipidemia: Secondary | ICD-10-CM | POA: Insufficient documentation

## 2024-01-21 DIAGNOSIS — I6523 Occlusion and stenosis of bilateral carotid arteries: Secondary | ICD-10-CM | POA: Diagnosis present

## 2024-01-21 DIAGNOSIS — R600 Localized edema: Secondary | ICD-10-CM | POA: Diagnosis present

## 2024-01-21 DIAGNOSIS — I25110 Atherosclerotic heart disease of native coronary artery with unstable angina pectoris: Secondary | ICD-10-CM | POA: Diagnosis present

## 2024-01-21 DIAGNOSIS — H5789 Other specified disorders of eye and adnexa: Secondary | ICD-10-CM

## 2024-01-21 DIAGNOSIS — L509 Urticaria, unspecified: Secondary | ICD-10-CM | POA: Diagnosis not present

## 2024-01-21 DIAGNOSIS — E118 Type 2 diabetes mellitus with unspecified complications: Secondary | ICD-10-CM | POA: Diagnosis present

## 2024-01-21 DIAGNOSIS — I5022 Chronic systolic (congestive) heart failure: Secondary | ICD-10-CM | POA: Insufficient documentation

## 2024-01-21 DIAGNOSIS — I10 Essential (primary) hypertension: Secondary | ICD-10-CM | POA: Diagnosis present

## 2024-01-21 LAB — CBC

## 2024-01-21 LAB — LIPID PANEL

## 2024-01-21 MED ORDER — LEVOCETIRIZINE DIHYDROCHLORIDE 5 MG PO TABS
5.0000 mg | ORAL_TABLET | Freq: Every evening | ORAL | 5 refills | Status: AC
  Filled 2024-01-21 – 2024-02-17 (×2): qty 30, 30d supply, fill #0

## 2024-01-21 MED ORDER — SPIRONOLACTONE 25 MG PO TABS
12.5000 mg | ORAL_TABLET | Freq: Every day | ORAL | 3 refills | Status: AC
  Filled 2024-01-21 – 2024-05-10 (×3): qty 45, 90d supply, fill #0

## 2024-01-21 MED ORDER — FAMOTIDINE 20 MG PO TABS
20.0000 mg | ORAL_TABLET | Freq: Two times a day (BID) | ORAL | 5 refills | Status: AC
  Filled 2024-01-21: qty 30, 15d supply, fill #0

## 2024-01-21 NOTE — Patient Instructions (Signed)
 Medication Instructions:  DECREASE SPIRONOLACTONE  TO 12.5 MG DAILY. CONTINUE ALL OTHER CURRENT MEDICATION THERAPY.  Lab Work: FASTING LIPID PANEL, BMET, CBC, AND LFTs TO BE DONE TODAY.  Testing/Procedures:  [facility] A DEPT OF [place] [address] A DEPT OF THE [place]. [facility] [address] [place] [address] Dept: [phone] Loc: [phone]  [name]  01/21/2024  You are scheduled for a Cardiac Catheterization on Monday, September 22 with Dr. Gordy Bergamo.  1. Please arrive at the [facility] (Main Entrance A) at [facility]: [address] [place], [place] [address] at 7:00 AM (This time is 2 hour(s) before your procedure to ensure your preparation).   Free valet parking service is available. You will check in at ADMITTING. The support person will be asked to wait in the waiting room.  It is OK to have someone drop you off and come back when you are ready to be discharged.    Special note: Every effort is made to have your procedure done on time. Please understand that emergencies sometimes delay scheduled procedures.  2. Diet: Nothing to eat after midnight.   3. Hydration: Nothing to eat and drink after midnight. (For TEE and Cath the same day)  4. Labs: You will need to have blood drawn on Friday, September 12 at [facility] (1st Floor), [address], [place], [place] [address]. You do not need to be fasting.  5. Medication instructions in preparation for your procedure:   Contrast Allergy: No  Do not take Diabetes Med Glucophage  (Metformin ) on the day of the procedure and HOLD 48 HOURS AFTER THE PROCEDURE.  On the morning of your procedure, take your Aspirin  81 mg and any morning medicines NOT listed above.  You may use sips of water .  6. Plan to go home the same day, you will only stay overnight if medically necessary. 7. Bring a current list of  your medications and current insurance cards. 8. You MUST have a responsible person to drive you home. 9. Someone MUST be with you the first 24 hours after you arrive home or your discharge will be delayed. 10. Please wear clothes that are easy to get on and off and wear slip-on shoes.  Thank you for allowing us  to care for you!   -- [facility]   Follow-Up: At [facility], you and your health needs are our priority.  As part of our continuing mission to provide you with exceptional heart care, our providers are all part of one team.  This team includes your primary Cardiologist (physician) and Advanced Practice Providers or APPs (Physician Assistants and Nurse Practitioners) who all work together to provide you with the care you need, when you need it.  Your next appointment:   3 week(s)  Provider:   Dorn Lesches, MD OR Lum Louis, NP

## 2024-01-21 NOTE — Progress Notes (Signed)
 New Patient Note  RE: [name] MRN: [id number] DOB: 03-16-1971 Date of Office Visit: 01/21/2024  Primary care provider: Theotis Haze ORN, NP  Chief Complaint: lesions on body, reactions, eye swelling, itching  History of present illness: [name] is a 53 y.o. female presenting today for evaluation of allergic reaction. Discussed the use of AI scribe software for clinical note transcription with the patient, who gave verbal consent to proceed.  In August, she experienced an allergic reaction, waking up with swollen eyes that did not improve after taking Benadryl. The swelling persisted into the next day, prompting her son to suggest a visit to urgent care.  At urgent care, she was unable to identify the allergen, noting her only known allergy is to heparin . She was given hydroxyzine and a steroid pack. The hydroxyzine increased her itching, leading her to discontinue it, while the steroid pack helped reduce the swelling.  In addition to eye swelling, she experienced generalized itching, particularly on the back of her legs, where she developed a flat, red rash. The rash was also present on her arms but was less severe. No previous similar episodes, recent illness, or vaccinations prior to the onset. She recalls eating shrimp and steak at a restaurant a week before the reaction but has not had shrimp since. She has consumed beef without issues.  No recent changes in body products, bedding, or travel. No insect bites or stings. Her past medical history includes diabetes.      Review of systems: 10pt ROS negative unless noted above in HPI  Past medical history: Past Medical History:  Diagnosis Date   CAD (coronary artery disease)    cath 09/07/2014 DES to 95% prox RCA, residual 70-80% origin of AV groove, 90% ostial PDA stenosis; 01/2015, NSTEMI w/ CABG, LIMA-LAD, SVG-OM1, SVG-PCA   Carotid artery disease (HCC)    a. 40-59% BICA by duplex 01/2015.   Diabetes mellitus  without complication (HCC)    H/O medication noncompliance    Hyperlipidemia    Hypertension    MI (myocardial infarction) (HCC)    Tobacco abuse     Past surgical history: Past Surgical History:  Procedure Laterality Date   ABDOMINAL HYSTERECTOMY  2008   ANTERIOR CERVICAL DECOMP/DISCECTOMY FUSION N/A 08/26/2021   Procedure: Anterior Cervical Decompression/Discectomy Fusion Cervical Six-Seven;  Surgeon: Lanis Pupa, MD;  [facility] OR;  Service: Neurosurgery;  Laterality: N/A;  3C   CARDIAC CATHETERIZATION N/A 02/01/2015   Procedure: Left Heart Cath and Coronary Angiography;  Surgeon: Dorn JINNY Lesches, MD;  Location: [facility] INVASIVE CV LAB;  Service: Cardiovascular;  Laterality: N/A;   CARDIAC CATHETERIZATION N/A 02/07/2015   Procedure: IABP Insertion;  Surgeon: Toribio JONELLE Fuel, MD;  Location: [facility] INVASIVE CV LAB;  Service: Cardiovascular;  Laterality: N/A;   CORONARY ARTERY BYPASS GRAFT N/A 02/07/2015   Procedure: CORONARY ARTERY BYPASS GRAFTING (CABG);  Surgeon: Maude Fleeta Ochoa, MD; LIMA-LAD, SVT-OM1, SVG-PDA   LEFT HEART CATHETERIZATION WITH CORONARY ANGIOGRAM N/A 05/29/2014   Procedure: LEFT HEART CATHETERIZATION WITH CORONARY ANGIOGRAM;  Surgeon: Candyce GORMAN Reek, MD;  Location[facility] C[facility] LAB;  Service: Cardiovascular;  Laterality: N/A;   LEFT HEART CATHETERIZATION WITH CORONARY ANGIOGRAM N/A 09/07/2014   Procedure: LEFT HEART CATHETERIZATION WITH CORONARY ANGIOGRAM;  Surgeon: Alm ORN Clay, MD;  Location: [facility] CATH LAB;  Service: Cardiovascular;  Laterality: N/A;   PERCUTANEOUS CORONARY STENT INTERVENTION (PCI-S)  05/29/2014   Procedure: PERCUTANEOUS CORONARY STENT INTERVENTION (PCI-S);  Surgeon: Candyce GORMAN Reek, MD;  Location:  [facility] CATH LAB;  Service: Cardiovascular;;   TEE WITHOUT CARDIOVERSION N/A 02/07/2015   Procedure: TRANSESOPHAGEAL ECHOCARDIOGRAM (TEE);  Surgeon: Maude Fleeta Ochoa, MD;  Location: [facility] OR;  Service: Open Heart Surgery;  Laterality: N/A;    Family  history:  Family History  Problem Relation Age of Onset   Hypertension Mother    Kidney failure Mother    Stroke Mother 27   Heart attack Father 37   Heart disease Father    Breast cancer Sister     Social history: Lives in a home without carpeting with gas heating and window cooling.  Dog in the home.  No concern for water  damage, mildew or roaches in the home.  She is a Conservation officer, nature.  Denies a smoking history.  Medication List: Current Outpatient Medications  Medication Sig Dispense Refill   acetaminophen  (TYLENOL ) 500 MG tablet Take 500 mg by mouth every 6 (six) hours as needed for moderate pain.     aspirin  EC 81 MG tablet Take 1 tablet (81 mg total) by mouth daily. 30 tablet 11   atorvastatin  (LIPITOR ) 40 MG tablet Take 1 tablet (40 mg total) by mouth daily. 90 tablet 3   carvedilol  (COREG ) 25 MG tablet Take 0.5 tablets (12.5 mg total) by mouth 2 (two) times daily. 90 tablet 3   dapagliflozin  propanediol (FARXIGA ) 10 MG TABS tablet Take 1 tablet (10 mg total) by mouth daily before breakfast. 90 tablet 2   metFORMIN  (GLUCOPHAGE ) 500 MG tablet Take 2 tablets (1,000 mg total) by mouth 2 (two) times daily with a meal. 360 tablet 1   nitroGLYCERIN  (NITROSTAT ) 0.4 MG SL tablet Place 1 tablet (0.4 mg total) under the tongue every 5 (five) minutes as needed for chest pain. 25 tablet 10   Omega-3 Fatty Acids (FISH OIL ) 1200 MG CAPS Take 1,200 mg by mouth at bedtime.     omeprazole  (PRILOSEC) 40 MG capsule TAKE 1 CAPSULE (40 MG TOTAL) BY MOUTH DAILY. 90 capsule 1   sacubitril -valsartan  (ENTRESTO ) 49-51 MG Take 1 tablet by mouth 2 (two) times daily. 60 tablet 2   Semaglutide ,0.25 or 0.5MG /DOS, (OZEMPIC , 0.25 OR 0.5 MG/DOSE,) 2 MG/3ML SOPN Inject 0.5 mg into the skin once a week. 3 mL 1   spironolactone  (ALDACTONE ) 25 MG tablet Take 0.5 tablets (12.5 mg total) by mouth daily. 45 tablet 3   carvedilol  (COREG ) 25 MG tablet Take 0.5 tablets (12.5 mg total) by mouth 2 (two) times daily. (Patient not  taking: Reported on 01/21/2024) 45 tablet 0   Continuous Glucose Receiver (DEXCOM G7 RECEIVER) DEVI Use to check blood sugar continuously throughout the day. Change sensors once every 10 days. (Patient not taking: Reported on 01/21/2024) 1 each 0   Continuous Glucose Sensor (DEXCOM G7 SENSOR) MISC Use to check blood sugar continuously throughout the day. Change sensors once every 10 days. (Patient not taking: Reported on 01/21/2024) 3 each 6   No current facility-administered medications for this visit.    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  General: Alert, interactive, in no acute distress. HEENT: PERRLA, TMs pearly gray, turbinates non-edematous without discharge, post-pharynx non erythematous. Neck: Supple without lymphadenopathy. Lungs: Clear to auscultation without wheezing, rhonchi or  rales. {no increased work of breathing. CV: Normal S1, S2 without murmurs. Abdomen: Nondistended, nontender. Skin: Posterior right knee has a hyperpigmented linear lesion with scabbed central area, similar lesions on the anterior knee and shins bilaterally. Extremities:  No clubbing, cyanosis or edema. Neuro:   Grossly intact.  Diagnostics/Labs: None today.  Assessment and plan:   Urticaria, angioedema, pruritus  Experienced allergic reaction with eye swelling and pruritic rash.  Differential includes food allergy, particularly shellfish, and nonallergic causes such as autoimmune conditions. Hydroxyzine increased itching, steroid pack reduced swelling. Beef allergy ruled out. - Order blood work: blood cell counts, liver and kidney function tests, thyroid studies, autoimmune testing, specific allergy tests  for shellfish and environmental allergens. - Advise to avoid shellfish until test results are available. - Prescribe Xyzal  5mg  1 tab twice a day as primary antihistamine and Pepcid  20mg  1 tab twice a day as secondary antihistamine if you have future episodes of swelling/reactions - Instruct to use Vaseline to keep healing lesions moisturized. - Avoid Neosporin due to potential irritation as this is a common topical allergen.  Follow-up in 4-6 months or sooner if needed  I appreciate the opportunity to take part in [name]'s care. Please do not hesitate to contact me with questions.  Sincerely,   Danita Brain, MD Allergy/Immunology [facility]

## 2024-01-21 NOTE — Progress Notes (Signed)
 Cardiology Office Note:    Date:  01/21/2024  ID:  [name], DOB Aug 25, 1970, MRN [id number] PCP: Theotis Haze ORN, NP  [facility] Providers Cardiologist:  Dorn Lesches, MD       Patient Profile:       Chief Complaint: Follow-up abnormal stress test History of Present Illness:  [name] is a 53 y.o. female with visit-pertinent history of coronary artery disease s/p multiple interventions (DES to proximal LCx and balloon angioplasty to mid LCx as well as DES to RCA in 05/2014 in the setting of NSTEMI, DES to RCA in 08/2014, and then CABG x 3 (LIMA-LAD, SVG-OM, SVG-PDA) in 01/2015, ischemic cardiomyopathy, chronic HFrEF with a EF of 30%, bilateral carotid stenosis, hypertension, hyperlipidemia, type 2 diabetes, medication noncompliance, and tobacco abuse  She has had multiple PCI's and then a CABG x 3 in 2016.  Since 2019 her LVEF has been slowly downtrending.  Echocardiogram 09/2023 showed LVEF of 30% with multiple wall motion abnormalities.  She was seen on 09/2023 at which time she reported dyspnea on exertion and chest tightness when she overexerts herself.  Cardiac PET stress test was ordered but not been completed.  She was last seen in clinic on 01/04/2024.  She continues to experience dyspnea on exertion when she overexerts herself but denied any chest pain/discomfort.  She had soft BP and complaints of mild dizziness.  Her Entresto  was decreased to 49-51 mg twice daily.  She was continued on carvedilol , spironolactone , and Farxiga .  She underwent cardiac PET/CT on 01/12/2024.  Findings were consistent with ischemia and infarction.  The study was high risk.  In addition to areas of scar in the inferior and lateral wall, there was large area of anterior ischemia and limited peri-infarct lateral wall ischemia.   Discussed the use of AI scribe software for clinical note transcription with the patient, who gave verbal consent to proceed.  History of Present  Illness [name] is a 53 year old female with coronary artery disease who presents for evaluation following an abnormal PET stress test.  She experiences occasional lightheadedness, particularly when standing, with home blood pressure systolic readings between 95 and 101 mmHg. She denies syncope or presyncope.   She experiences shortness of breath upon exertion, such as walking uphill, and mild chest discomfort primarily with overexertion.  She is able to work at [employer] and do house chores and yard work without any exertional symptoms.  Particularly she only notices symptoms when she walks up a hill or steps.  She denies any orthopnea, PND, leg swelling.  Her weight has been stable.   Review of systems:  Please see the history of present illness. All other systems are reviewed and otherwise negative.      Studies Reviewed:    EKG Interpretation Date/Time:  Friday January 21 2024 08:26:50 EDT Ventricular Rate:  84 PR Interval:  150 QRS Duration:  106 QT Interval:  360 QTC Calculation: 425 R Axis:   -42  Text Interpretation: Normal sinus rhythm Left axis deviation Cannot rule out Anterior infarct (cited on or before 16-Aug-2023) ST & T wave abnormality, consider lateral ischemia When compared with ECG of 16-Aug-2023 10:38, No significant change was found Confirmed by Rana Dixon [phone]) on 01/21/2024 1:08:56 PM    Cardiac PET/CT 01/12/2024   Findings are consistent with ischemia and infarction. The study is high risk. In addition to areas of scar in the inferior and lateral wall, there is a large area of  anterior ischemia and limited peri-infarct lateral wall ischemia. With stress, anterior wall motion worsens and LVEF deteriorates further. Recommend cardiac catheterization.   LV perfusion is abnormal. There is evidence of ischemia. There is evidence of infarction. Defect 1: There is a large defect with severe reduction in uptake present in the apical to basal inferior and  inferolateral location(s) that is fixed. There is abnormal wall motion in the defect area. Consistent with infarction. The defect is consistent with abnormal perfusion in the RCA territory. Defect 2: There is a medium defect with moderate reduction in uptake present in the apical to basal anterolateral location(s) that is partially reversible. There is abnormal wall motion in the defect area. Consistent with infarction and peri-infarct ischemia. The defect is consistent with abnormal perfusion in the LCx territory. Defect 3: There is a medium defect with severe reduction in uptake present in the mid to basal anterior location(s). There is normal wall motion in the defect area. Consistent with ischemia. The defect is consistent with abnormal perfusion in the LAD territory.   Rest left ventricular function is abnormal. Rest global function is moderately reduced. There were multiple regional abnormalities. Rest EF: 38%. Stress left ventricular function is abnormal. Stress global function is severely reduced. There were multiple regional abnormalities. Stress EF: 32%. End diastolic cavity size is normal. End systolic cavity size is normal.   Myocardial blood flow was computed to be 0.16ml/g/min at rest and 1.12ml/g/min at stress. Global myocardial blood flow reserve was 1.60 and was abnormal.   Coronary calcium  assessment not performed due to prior revascularization.   Electronically signed by Jerel Balding, MD  Carotid duplex 10/20/2023 Right Carotid: Velocities in the right ICA are consistent with a 40-59%                 stenosis. The ECA appears <50% stenosed.   Left Carotid: Velocities in the left ICA are consistent with a 1-39%  stenosis.   Vertebrals: Bilateral vertebral arteries demonstrate antegrade flow.  Subclavians: Normal flow hemodynamics were seen in bilateral subclavian               arteries.   Echocardiogram 09/17/2023 1. Left ventricular ejection fraction, by estimation, is 30%. Left   ventricular ejection fraction by 3D volume is 29 %. The left ventricle has  severely decreased function. The left ventricle demonstrates regional wall  motion abnormalities (see scoring  diagram/findings for description). Left ventricular diastolic parameters  are consistent with Grade I diastolic dysfunction (impaired relaxation).  No LV thrombus.   2. Right ventricular systolic function is normal. The right ventricular  size is normal. There is normal pulmonary artery systolic pressure.   3. Diminished left atrial reservoir strain; 13.8%.   4. The mitral valve is normal in structure. Trivial mitral valve  regurgitation. No evidence of mitral stenosis. The mean mitral valve  gradient is 1.3 mmHg with average heart rate of 76 bpm.   5. The aortic valve is tricuspid. Aortic valve regurgitation is not  visualized. No aortic stenosis is present.   6. The inferior vena cava is normal in size with greater than 50%  respiratory variability, suggesting right atrial pressure of 3 mmHg.   Echocardiogram 10/02/2022 1. Akinesis of the inferolateral wall with overall moderate to severe LV  dyfunction.   2. Left ventricular ejection fraction, by estimation, is 30 to 35%. The  left ventricle has moderately decreased function. The left ventricle  demonstrates regional wall motion abnormalities (see scoring  diagram/findings for description). The  left  ventricular internal cavity size was mildly dilated. Left ventricular  diastolic parameters are consistent with Grade I diastolic dysfunction  (impaired relaxation).   3. Right ventricular systolic function is normal. The right ventricular  size is normal.   4. The mitral valve is normal in structure. Trivial mitral valve  regurgitation. No evidence of mitral stenosis.   5. The aortic valve is tricuspid. Aortic valve regurgitation is not  visualized. No aortic stenosis is present.   6. The inferior vena cava is normal in size with greater than 50%   respiratory variability, suggesting right atrial pressure of 3 mmHg.  Risk Assessment/Calculations:              Physical Exam:   VS:  BP 95/64   Pulse 84   Ht 5' 3 (1.6 m)   Wt 140 lb (63.5 kg)   SpO2 100%   BMI 24.80 kg/m    Wt Readings from Last 3 Encounters:  01/21/24 140 lb (63.5 kg)  01/04/24 142 lb 12.8 oz (64.8 kg)  12/20/23 143 lb 9.6 oz (65.1 kg)    GEN: Well nourished, well developed in no acute distress NECK: No JVD; No carotid bruits CARDIAC: RRR, no murmurs, rubs, gallops RESPIRATORY:  Clear to auscultation without rales, wheezing or rhonchi  ABDOMEN: Soft, non-tender, non-distended EXTREMITIES:  No edema; No acute deformity      Assessment and Plan:  Coronary artery disease History of multiple PCI in early 2016 and then CABG x 3 in 01/2015 Myoview  02/2018 consistent with prior infarct but no ischemia Recent cardiac PET/CT 01/2024 consistent with ischemia and infarction, high risk study in addition to areas of scar in the inferior and lateral, there is a large area of anterior ischemia and limited peri-infarct lateral wall ischemia.  With stress, anterior wall motion worsens and LVEF deteriorates further. - Today patient continues to experience dyspnea on exertion as well as chest tightness on exertion.  Denies any sharp chest pains, radiation of pain, N/V, or diaphoresis.  Symptoms primarily only when walking upstairs or uphill.  Interestingly no symptoms when at work, doing house chores, or yard work - Plan for left heart catheterization for further ischemic evaluation given ongoing anginal symptoms and recent abnormal PET stress - Continue aspirin  81 mg daily, carvedilol  12.5 mg twice daily, atorvastatin  40 mg daily, and nitroglycerin  as needed - ED precautions given  Cardiac catheterization scheduled for 01/31/2024 with Dr. Gordy Bergamo  Ischemic cardiomyopathy Chronic HFrEF LVEF has gradually declined over the last several years Recent echocardiogram 09/2023  showed LVEF of 30% with akinesis of the entire lateral wall and apical inferior segment and hypokinesis of the entire anterior wall, entire septum and inferior wall, no LV thrombus - Today patient appears euvolemic and well compensated on exam -Entresto  decreased during last office visit given soft BP.  Today blood pressure remains soft at 95/64, patient does report associated dizziness - Plan to decrease spironolactone  to 12.5 mg daily - Continue carvedilol  12.5 mg twice daily, Farxiga  10 mg daily, Entresto  49-51 mg twice daily, spironolactone  12.5 mg daily - After upcoming cardiac catheterization, during follow-up visit plan to refer to EP for consideration of ICD for primary prevention of sudden cardiac death given persistently low EF  Bilateral carotid stenosis Carotid Doppler 10/2023 showed 40-59% stenosis of right ICA and 1-39% stenosis of left ICA - She denies new neurological symptoms - Plan to repeat Dopplers in 10/2024 - Continue aspirin  and atorvastatin   Hypertension Had some intermittent dizziness during  last office visit on 8/26.  Entresto  was subsequently decreased - Today blood pressure remains low normal at 95/64.  She continues to have associated dizziness.  Home BP similar to office blood pressure today - Plan to decrease spironolactone  to 12.5 mg daily - Continue HF GDMT - Continue home BP monitoring  Hyperlipidemia, LDL goal <55 LDL 36 on 10/2022 and well-controlled - She is overdue for labs - Plan for repeat fasting lipid panel today - Continue atorvastatin  40 mg daily  T2DM A1c 5.9% on 08/2023 - Managed on metformin  and Ozempic  per PCP   Informed Consent   Shared Decision Making/Informed Consent The risks [stroke (1 in 1000), death (1 in 1000), kidney failure [usually temporary] (1 in 500), bleeding (1 in 200), allergic reaction [possibly serious] (1 in 200)], benefits (diagnostic support and management of coronary artery disease) and alternatives of a cardiac  catheterization were discussed in detail with Ms. Kamp and she is willing to proceed.     Dispo:  Return in about 3 weeks (around 02/11/2024).  Signed, Lum LITTIE Louis, NP

## 2024-01-21 NOTE — H&P (View-Only) (Signed)
 Cardiology Office Note:    Date:  01/21/2024  ID:  [name], DOB Aug 25, 1970, MRN [id number] PCP: Theotis Haze ORN, NP  [facility] Providers Cardiologist:  Dorn Lesches, MD       Patient Profile:       Chief Complaint: Follow-up abnormal stress test History of Present Illness:  [name] is a 53 y.o. female with visit-pertinent history of coronary artery disease s/p multiple interventions (DES to proximal LCx and balloon angioplasty to mid LCx as well as DES to RCA in 05/2014 in the setting of NSTEMI, DES to RCA in 08/2014, and then CABG x 3 (LIMA-LAD, SVG-OM, SVG-PDA) in 01/2015, ischemic cardiomyopathy, chronic HFrEF with a EF of 30%, bilateral carotid stenosis, hypertension, hyperlipidemia, type 2 diabetes, medication noncompliance, and tobacco abuse  She has had multiple PCI's and then a CABG x 3 in 2016.  Since 2019 her LVEF has been slowly downtrending.  Echocardiogram 09/2023 showed LVEF of 30% with multiple wall motion abnormalities.  She was seen on 09/2023 at which time she reported dyspnea on exertion and chest tightness when she overexerts herself.  Cardiac PET stress test was ordered but not been completed.  She was last seen in clinic on 01/04/2024.  She continues to experience dyspnea on exertion when she overexerts herself but denied any chest pain/discomfort.  She had soft BP and complaints of mild dizziness.  Her Entresto  was decreased to 49-51 mg twice daily.  She was continued on carvedilol , spironolactone , and Farxiga .  She underwent cardiac PET/CT on 01/12/2024.  Findings were consistent with ischemia and infarction.  The study was high risk.  In addition to areas of scar in the inferior and lateral wall, there was large area of anterior ischemia and limited peri-infarct lateral wall ischemia.   Discussed the use of AI scribe software for clinical note transcription with the patient, who gave verbal consent to proceed.  History of Present  Illness [name] is a 53 year old female with coronary artery disease who presents for evaluation following an abnormal PET stress test.  She experiences occasional lightheadedness, particularly when standing, with home blood pressure systolic readings between 95 and 101 mmHg. She denies syncope or presyncope.   She experiences shortness of breath upon exertion, such as walking uphill, and mild chest discomfort primarily with overexertion.  She is able to work at [employer] and do house chores and yard work without any exertional symptoms.  Particularly she only notices symptoms when she walks up a hill or steps.  She denies any orthopnea, PND, leg swelling.  Her weight has been stable.   Review of systems:  Please see the history of present illness. All other systems are reviewed and otherwise negative.      Studies Reviewed:    EKG Interpretation Date/Time:  Friday January 21 2024 08:26:50 EDT Ventricular Rate:  84 PR Interval:  150 QRS Duration:  106 QT Interval:  360 QTC Calculation: 425 R Axis:   -42  Text Interpretation: Normal sinus rhythm Left axis deviation Cannot rule out Anterior infarct (cited on or before 16-Aug-2023) ST & T wave abnormality, consider lateral ischemia When compared with ECG of 16-Aug-2023 10:38, No significant change was found Confirmed by Rana Dixon [phone]) on 01/21/2024 1:08:56 PM    Cardiac PET/CT 01/12/2024   Findings are consistent with ischemia and infarction. The study is high risk. In addition to areas of scar in the inferior and lateral wall, there is a large area of  anterior ischemia and limited peri-infarct lateral wall ischemia. With stress, anterior wall motion worsens and LVEF deteriorates further. Recommend cardiac catheterization.   LV perfusion is abnormal. There is evidence of ischemia. There is evidence of infarction. Defect 1: There is a large defect with severe reduction in uptake present in the apical to basal inferior and  inferolateral location(s) that is fixed. There is abnormal wall motion in the defect area. Consistent with infarction. The defect is consistent with abnormal perfusion in the RCA territory. Defect 2: There is a medium defect with moderate reduction in uptake present in the apical to basal anterolateral location(s) that is partially reversible. There is abnormal wall motion in the defect area. Consistent with infarction and peri-infarct ischemia. The defect is consistent with abnormal perfusion in the LCx territory. Defect 3: There is a medium defect with severe reduction in uptake present in the mid to basal anterior location(s). There is normal wall motion in the defect area. Consistent with ischemia. The defect is consistent with abnormal perfusion in the LAD territory.   Rest left ventricular function is abnormal. Rest global function is moderately reduced. There were multiple regional abnormalities. Rest EF: 38%. Stress left ventricular function is abnormal. Stress global function is severely reduced. There were multiple regional abnormalities. Stress EF: 32%. End diastolic cavity size is normal. End systolic cavity size is normal.   Myocardial blood flow was computed to be 0.16ml/g/min at rest and 1.12ml/g/min at stress. Global myocardial blood flow reserve was 1.60 and was abnormal.   Coronary calcium  assessment not performed due to prior revascularization.   Electronically signed by Jerel Balding, MD  Carotid duplex 10/20/2023 Right Carotid: Velocities in the right ICA are consistent with a 40-59%                 stenosis. The ECA appears <50% stenosed.   Left Carotid: Velocities in the left ICA are consistent with a 1-39%  stenosis.   Vertebrals: Bilateral vertebral arteries demonstrate antegrade flow.  Subclavians: Normal flow hemodynamics were seen in bilateral subclavian               arteries.   Echocardiogram 09/17/2023 1. Left ventricular ejection fraction, by estimation, is 30%. Left   ventricular ejection fraction by 3D volume is 29 %. The left ventricle has  severely decreased function. The left ventricle demonstrates regional wall  motion abnormalities (see scoring  diagram/findings for description). Left ventricular diastolic parameters  are consistent with Grade I diastolic dysfunction (impaired relaxation).  No LV thrombus.   2. Right ventricular systolic function is normal. The right ventricular  size is normal. There is normal pulmonary artery systolic pressure.   3. Diminished left atrial reservoir strain; 13.8%.   4. The mitral valve is normal in structure. Trivial mitral valve  regurgitation. No evidence of mitral stenosis. The mean mitral valve  gradient is 1.3 mmHg with average heart rate of 76 bpm.   5. The aortic valve is tricuspid. Aortic valve regurgitation is not  visualized. No aortic stenosis is present.   6. The inferior vena cava is normal in size with greater than 50%  respiratory variability, suggesting right atrial pressure of 3 mmHg.   Echocardiogram 10/02/2022 1. Akinesis of the inferolateral wall with overall moderate to severe LV  dyfunction.   2. Left ventricular ejection fraction, by estimation, is 30 to 35%. The  left ventricle has moderately decreased function. The left ventricle  demonstrates regional wall motion abnormalities (see scoring  diagram/findings for description). The  left  ventricular internal cavity size was mildly dilated. Left ventricular  diastolic parameters are consistent with Grade I diastolic dysfunction  (impaired relaxation).   3. Right ventricular systolic function is normal. The right ventricular  size is normal.   4. The mitral valve is normal in structure. Trivial mitral valve  regurgitation. No evidence of mitral stenosis.   5. The aortic valve is tricuspid. Aortic valve regurgitation is not  visualized. No aortic stenosis is present.   6. The inferior vena cava is normal in size with greater than 50%   respiratory variability, suggesting right atrial pressure of 3 mmHg.  Risk Assessment/Calculations:              Physical Exam:   VS:  BP 95/64   Pulse 84   Ht 5' 3 (1.6 m)   Wt 140 lb (63.5 kg)   SpO2 100%   BMI 24.80 kg/m    Wt Readings from Last 3 Encounters:  01/21/24 140 lb (63.5 kg)  01/04/24 142 lb 12.8 oz (64.8 kg)  12/20/23 143 lb 9.6 oz (65.1 kg)    GEN: Well nourished, well developed in no acute distress NECK: No JVD; No carotid bruits CARDIAC: RRR, no murmurs, rubs, gallops RESPIRATORY:  Clear to auscultation without rales, wheezing or rhonchi  ABDOMEN: Soft, non-tender, non-distended EXTREMITIES:  No edema; No acute deformity      Assessment and Plan:  Coronary artery disease History of multiple PCI in early 2016 and then CABG x 3 in 01/2015 Myoview  02/2018 consistent with prior infarct but no ischemia Recent cardiac PET/CT 01/2024 consistent with ischemia and infarction, high risk study in addition to areas of scar in the inferior and lateral, there is a large area of anterior ischemia and limited peri-infarct lateral wall ischemia.  With stress, anterior wall motion worsens and LVEF deteriorates further. - Today patient continues to experience dyspnea on exertion as well as chest tightness on exertion.  Denies any sharp chest pains, radiation of pain, N/V, or diaphoresis.  Symptoms primarily only when walking upstairs or uphill.  Interestingly no symptoms when at work, doing house chores, or yard work - Plan for left heart catheterization for further ischemic evaluation given ongoing anginal symptoms and recent abnormal PET stress - Continue aspirin  81 mg daily, carvedilol  12.5 mg twice daily, atorvastatin  40 mg daily, and nitroglycerin  as needed - ED precautions given  Cardiac catheterization scheduled for 01/31/2024 with Dr. Gordy Bergamo  Ischemic cardiomyopathy Chronic HFrEF LVEF has gradually declined over the last several years Recent echocardiogram 09/2023  showed LVEF of 30% with akinesis of the entire lateral wall and apical inferior segment and hypokinesis of the entire anterior wall, entire septum and inferior wall, no LV thrombus - Today patient appears euvolemic and well compensated on exam -Entresto  decreased during last office visit given soft BP.  Today blood pressure remains soft at 95/64, patient does report associated dizziness - Plan to decrease spironolactone  to 12.5 mg daily - Continue carvedilol  12.5 mg twice daily, Farxiga  10 mg daily, Entresto  49-51 mg twice daily, spironolactone  12.5 mg daily - After upcoming cardiac catheterization, during follow-up visit plan to refer to EP for consideration of ICD for primary prevention of sudden cardiac death given persistently low EF  Bilateral carotid stenosis Carotid Doppler 10/2023 showed 40-59% stenosis of right ICA and 1-39% stenosis of left ICA - She denies new neurological symptoms - Plan to repeat Dopplers in 10/2024 - Continue aspirin  and atorvastatin   Hypertension Had some intermittent dizziness during  last office visit on 8/26.  Entresto  was subsequently decreased - Today blood pressure remains low normal at 95/64.  She continues to have associated dizziness.  Home BP similar to office blood pressure today - Plan to decrease spironolactone  to 12.5 mg daily - Continue HF GDMT - Continue home BP monitoring  Hyperlipidemia, LDL goal <55 LDL 36 on 10/2022 and well-controlled - She is overdue for labs - Plan for repeat fasting lipid panel today - Continue atorvastatin  40 mg daily  T2DM A1c 5.9% on 08/2023 - Managed on metformin  and Ozempic  per PCP   Informed Consent   Shared Decision Making/Informed Consent The risks [stroke (1 in 1000), death (1 in 1000), kidney failure [usually temporary] (1 in 500), bleeding (1 in 200), allergic reaction [possibly serious] (1 in 200)], benefits (diagnostic support and management of coronary artery disease) and alternatives of a cardiac  catheterization were discussed in detail with Ms. Kamp and she is willing to proceed.     Dispo:  Return in about 3 weeks (around 02/11/2024).  Signed, Lum LITTIE Louis, NP

## 2024-01-21 NOTE — Patient Instructions (Signed)
 Allergic reaction with swelling and pruritic rash, etiology under evaluation Experienced allergic reaction with eye swelling and pruritic rash.  Differential includes food allergy, particularly shellfish, and nonallergic causes such as autoimmune conditions. Hydroxyzine increased itching, steroid pack reduced swelling. Beef allergy ruled out. - Order blood work: blood cell counts, liver and kidney function tests, thyroid studies, autoimmune testing, specific allergy tests for shellfish and environmental allergens. - Advise to avoid shellfish until test results are available. - Prescribe Xyzal  5mg  1 tab twice a day as primary antihistamine and Pepcid  20mg  1 tab twice a day as secondary antihistamine if you have future episodes of swelling/reactions - Instruct to use Vaseline to keep healing lesions moisturized. - Avoid Neosporin due to potential irritation as this is a common topical allergen.  Follow-up in 4-6 months or sooner if needed

## 2024-01-22 LAB — BASIC METABOLIC PANEL WITH GFR
BUN/Creatinine Ratio: 23 (ref 9–23)
BUN: 25 mg/dL — AB (ref 6–24)
CO2: 19 mmol/L — AB (ref 20–29)
Calcium: 9.5 mg/dL (ref 8.7–10.2)
Chloride: 102 mmol/L (ref 96–106)
Creatinine, Ser: 1.09 mg/dL — AB (ref 0.57–1.00)
Glucose: 112 mg/dL — AB (ref 70–99)
Potassium: 5 mmol/L (ref 3.5–5.2)
Sodium: 137 mmol/L (ref 134–144)
eGFR: 61 mL/min/1.73 (ref >59)

## 2024-01-22 LAB — HEPATIC FUNCTION PANEL
ALT: 18 IU/L (ref 0–32)
AST: 18 IU/L (ref 0–40)
Albumin: 3.9 g/dL (ref 3.8–4.9)
Alkaline Phosphatase: 75 IU/L (ref 44–121)
Bilirubin Total: <0.2 mg/dL (ref 0.0–1.2)
Bilirubin, Direct: <0.08 mg/dL (ref 0.00–0.40)
Total Protein: 6.3 g/dL (ref 6.0–8.5)

## 2024-01-22 LAB — LIPID PANEL
Cholesterol, Total: 111 mg/dL (ref 100–199)
HDL: 44 mg/dL (ref >39)
LDL CALC COMMENT:: 2.5 ratio (ref 0.0–4.4)
LDL Chol Calc (NIH): 51 mg/dL (ref 0–99)
Triglycerides: 76 mg/dL (ref 0–149)
VLDL Cholesterol Cal: 16 mg/dL (ref 5–40)

## 2024-01-22 LAB — CBC
Hematocrit: 37.2 (ref 34.0–46.6)
Hemoglobin: 11.5 g/dL (ref 11.1–15.9)
MCH: 29.8 pg (ref 26.6–33.0)
MCHC: 30.9 g/dL — AB (ref 31.5–35.7)
MCV: 96 fL (ref 79–97)
Platelets: 231 x10E3/uL (ref 150–450)
RBC: 3.86 x10E6/uL (ref 3.77–5.28)
RDW: 16.2 — AB (ref 11.7–15.4)
WBC: 9.2 x10E3/uL (ref 3.4–10.8)

## 2024-01-24 ENCOUNTER — Ambulatory Visit (INDEPENDENT_AMBULATORY_CARE_PROVIDER_SITE_OTHER): Admitting: Family

## 2024-01-24 ENCOUNTER — Other Ambulatory Visit: Payer: Self-pay

## 2024-01-24 ENCOUNTER — Ambulatory Visit: Payer: Self-pay | Admitting: Emergency Medicine

## 2024-01-24 ENCOUNTER — Encounter: Payer: Self-pay | Admitting: Family

## 2024-01-24 VITALS — BP 110/70 | HR 76 | Temp 97.6°F

## 2024-01-24 DIAGNOSIS — H5789 Other specified disorders of eye and adnexa: Secondary | ICD-10-CM | POA: Diagnosis not present

## 2024-01-24 DIAGNOSIS — L299 Pruritus, unspecified: Secondary | ICD-10-CM | POA: Diagnosis not present

## 2024-01-24 DIAGNOSIS — L509 Urticaria, unspecified: Secondary | ICD-10-CM

## 2024-01-24 MED ORDER — PREDNISONE 10 MG PO TABS
10.0000 mg | ORAL_TABLET | Freq: Two times a day (BID) | ORAL | 0 refills | Status: AC
Start: 2024-01-24 — End: 2024-01-27
  Filled 2024-01-24: qty 6, 3d supply, fill #0

## 2024-01-24 NOTE — Patient Instructions (Addendum)
 Allergic reaction with swelling and pruritic rash, etiology under evaluation Past history: Experienced allergic reaction with eye swelling and pruritic rash.  Differential includes food allergy, particularly shellfish, and nonallergic causes such as autoimmune conditions. Hydroxyzine increased itching, steroid pack reduced swelling. Beef allergy ruled out. - Ordered blood work at last office visit: blood cell counts, liver and kidney function tests, thyroid studies, autoimmune testing, specific allergy tests for shellfish and environmental allergens. We will call you with results once they are all back - Advise to avoid shellfish until test results are available. - Increase Xyzal  (levocetirizine) 5mg  1 tab twice a day as primary antihistamine and START Pepcid  (famotidine ) 20mg  1 tab twice a day as secondary antihistamine  - May use Vaseline to keep healing lesions moisturized. - Avoid Neosporin due to potential irritation as this is a common topical allergen. - Start prednisone  10 mg taking 1 tablet twice a day for 3 days and then stop.  Keep already scheduled follow-up appointment on June 22, 2024 at 3 PM with Dr. Jeneal or sooner if needed

## 2024-01-24 NOTE — Progress Notes (Signed)
 [address] [place] [address] Dept: [phone]  FOLLOW UP NOTE  Patient ID: [name], female    DOB: 1971/04/20  Age: 53 y.o. MRN: [id number] Date of Office Visit: 01/24/2024  Assessment  Chief Complaint: Allergic Reaction, Rash, Pruritus, and Angioedema  HPI [name] is a 53 year old female who presents today for an acute visit of rash around left eye and swelling.  She was last seen on January 21, 2024 by Dr. Jeneal for urticaria, angioedema, and pruritus.  She denies any new diagnosis or surgery since her last office visit.  She reports that on Friday or Saturday she noticed a rash around her left eye that is itchy.  Then yesterday afternoon she noticed swelling around her left eye.  The swelling started after being out side yesterday but the rash was there previously.  She denies any recent insect stings or bug bites.  She also denies any new medications, new products, or new foods.  No one else in the household has this rash.  She denies any recent illnesses and reports her normal joint pain.  She is up-to-date on her vaccines, but has not received her flu vaccine or COVID-19 vaccine this year.  She denies any concomitant cardiorespiratory or gastrointestinal symptoms.  She reports the last time she had the rash and swelling was a little over a month ago and they gave her hydroxyzine and it made her itching worse.  The steroid pack did help with the swelling.  She continues to avoid shellfish without any accidental ingestion.  She is currently only taking Xyzal  5 mg once a day rather than the twice a day dosing as recommended at her last office visit.  She has not picked up Pepcid  20 mg 1 tablet twice a day as recommended.  She reports that she just got notified by her pharmacy that this is ready.  She is using Vaseline on her skin and stopped using Neosporin. She cannot see the rash today.   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  HENT:     Head: Normocephalic and atraumatic.     Comments: Pharynx normal, eyes normal, ears normal, nose: Bilateral lower turbinates mildly edematous with no drainage noted    Right Ear: Tympanic membrane, ear canal and external ear normal.     Left Ear: Tympanic membrane, ear canal and external ear normal.     Mouth/Throat:     Mouth: Mucous membranes are moist.     Pharynx: Oropharynx is clear.  Eyes:     Conjunctiva/sclera: Conjunctivae normal.  Cardiovascular:     Rate and Rhythm: Regular rhythm.     Heart sounds: Normal heart sounds.  Pulmonary:     Effort: Pulmonary effort is normal.     Breath sounds: Normal breath sounds.     Comments: Lungs clear to auscultation Musculoskeletal:     Cervical back: Neck supple.  Skin:    General: Skin is warm.     Comments: Swelling noted under left eye  Neurological:     Mental Status: She is alert.  Psychiatric:  Mood and Affect: Mood normal.        Behavior: Behavior normal.        Thought Content: Thought content normal.        Judgment: Judgment normal.     Diagnostics:  None  Assessment and Plan: 1. Urticaria   2. Pruritus   3. Eye swelling     Meds ordered this encounter  Medications   predniSONE  (DELTASONE ) 10 MG tablet    Sig: Take one tablet twice a day for 3 days    Dispense:  6 tablet    Refill:  0    Patient Instructions  Allergic reaction with swelling and pruritic rash, etiology under evaluation Past history: Experienced allergic reaction with eye swelling and pruritic rash.  Differential includes food allergy, particularly shellfish, and nonallergic causes such as autoimmune  conditions. Hydroxyzine increased itching, steroid pack reduced swelling. Beef allergy ruled out. - Ordered blood work at last office visit: blood cell counts, liver and kidney function tests, thyroid studies, autoimmune testing, specific allergy tests for shellfish and environmental allergens. We will call you with results once they are all back - Advise to avoid shellfish until test results are available. - Increase Xyzal  (levocetirizine) 5mg  1 tab twice a day as primary antihistamine and START Pepcid  (famotidine ) 20mg  1 tab twice a day as secondary antihistamine  - May use Vaseline to keep healing lesions moisturized. - Avoid Neosporin due to potential irritation as this is a common topical allergen. - Start prednisone  10 mg taking 1 tablet twice a day for 3 days and then stop.  Keep already scheduled follow-up appointment on June 22, 2024 at 3 PM with Dr. Jeneal or sooner if needed  Return in about 5 months (around 06/22/2024), or if symptoms worsen or fail to improve.    Thank you for the opportunity to care for this patient.  Please do not hesitate to contact me with questions.  Wanda Craze, FNP [facility] 

## 2024-01-27 ENCOUNTER — Telehealth: Payer: Self-pay | Admitting: *Deleted

## 2024-01-27 LAB — ALPHA-GAL PANEL
Allergen Lamb IgE: <0.1 kU/L
Beef IgE: <0.1 kU/L
IgE (Immunoglobulin E), Serum: 945 [IU]/mL — ABNORMAL HIGH (ref 6–495)
O215-IgE Alpha-Gal: <0.1 kU/L
Pork IgE: <0.1 kU/L

## 2024-01-27 LAB — CHRONIC URTICARIA (CU) EVAL
ALT: 17 IU/L (ref 0–32)
Basophils Absolute: 0.1 x10E3/uL (ref 0.0–0.2)
Basos: 1 %
CRP: <1 mg/L (ref 0–10)
EOS (ABSOLUTE): 0.1 x10E3/uL (ref 0.0–0.4)
Eos: 1 %
Hematocrit: 35.9 % (ref 34.0–46.6)
Hemoglobin: 11.3 g/dL (ref 11.1–15.9)
Immature Grans (Abs): 0 x10E3/uL (ref 0.0–0.1)
Immature Granulocytes: 0 %
Lymphocytes Absolute: 2.1 x10E3/uL (ref 0.7–3.1)
Lymphs: 32 %
MCH: 29.4 pg (ref 26.6–33.0)
MCHC: 31.5 g/dL (ref 31.5–35.7)
MCV: 94 fL (ref 79–97)
Monocytes Absolute: 0.4 x10E3/uL (ref 0.1–0.9)
Monocytes: 6 %
Neutrophils Absolute: 4 x10E3/uL (ref 1.4–7.0)
Neutrophils: 60 %
Platelets: 225 x10E3/uL (ref 150–450)
Pooled Donor- BAT CU: 4.92 % (ref 0.00–10.60)
RBC: 3.84 x10E6/uL (ref 3.77–5.28)
RDW: 15.9 % — ABNORMAL HIGH (ref 11.7–15.4)
Sed Rate: 15 mm/h (ref 0–40)
TSH: 1.67 u[IU]/mL (ref 0.450–4.500)
Thyroperoxidase Ab SerPl-aCnc: 11 [IU]/mL (ref 0–34)
WBC: 6.5 x10E3/uL (ref 3.4–10.8)

## 2024-01-27 LAB — ALLERGENS W/TOTAL IGE AREA 2
Alternaria Alternata IgE: <0.1 kU/L
Aspergillus Fumigatus IgE: <0.1 kU/L
Bermuda Grass IgE: 9.41 kU/L — AB
Cat Dander IgE: <0.1 kU/L
Cedar, Mountain IgE: 1.46 kU/L — AB
Cladosporium Herbarum IgE: 0.25 kU/L — AB
Cockroach, German IgE: 1.75 kU/L — AB
Common Silver Birch IgE: 2.11 kU/L — AB
Cottonwood IgE: 1.81 kU/L — AB
D Farinae IgE: 2.27 kU/L — AB
D Pteronyssinus IgE: 1.93 kU/L — AB
Dog Dander IgE: 0.78 kU/L — AB
Elm, American IgE: 2.4 kU/L — AB
Johnson Grass IgE: 8.29 kU/L — AB
Maple/Box Elder IgE: 2.27 kU/L — AB
Mouse Urine IgE: <0.1 kU/L
Oak, White IgE: 2.16 kU/L — AB
Pecan, Hickory IgE: 3.75 kU/L — AB
Penicillium Chrysogen IgE: 0.27 kU/L — AB
Pigweed, Rough IgE: 1.82 kU/L — AB
Ragweed, Short IgE: 3.33 kU/L — AB
Sheep Sorrel IgE Qn: 2.2 kU/L — AB
Timothy Grass IgE: 21.9 kU/L — AB
White Mulberry IgE: 1.14 kU/L — AB

## 2024-01-27 LAB — ALLERGEN PROFILE, SHELLFISH
Clam IgE: 0.52 kU/L — AB
F023-IgE Crab: 3.4 kU/L — AB
F080-IgE Lobster: 0.98 kU/L — AB
F290-IgE Oyster: 0.9 kU/L — AB
Scallop IgE: 0.82 kU/L — AB
Shrimp IgE: 0.75 kU/L — AB

## 2024-01-27 LAB — TRYPTASE: Tryptase: 10.9 ug/L (ref 2.2–13.2)

## 2024-01-27 NOTE — Telephone Encounter (Signed)
 Cardiac Catheterization scheduled at [facility] for: Monday January 31, 2024 9 AM Arrival time [facility] Main Entrance A at: 7 AM  Diet: -Nothing to eat after midnight.  Hydration: -May drink clear liquids until 2 hours before the procedure.  Approved liquids: Water , clear tea, black coffee, fruit juices-non-citric and without pulp,Gatorade, plain Jello/popsicles.   -Please drink 8 oz of water  2 hours before procedure.  Medication instructions: -Hold:  Metformin -day of procedure and 48 hours post procedure  Farxiga /Spironolactone -AM of procedure  Ozempic -weekly on Wednesdays-pt tells me last dose was 01/26/24 -Other usual morning medications can be taken including aspirin  81 mg.  Plan to go home the same day, you will only stay overnight if medically necessary.  You must have responsible adult to drive you home.  Someone must be with you the first 24 hours after you arrive home.  Reviewed procedure instructions with patient.

## 2024-01-31 ENCOUNTER — Other Ambulatory Visit: Payer: Self-pay

## 2024-01-31 ENCOUNTER — Encounter (HOSPITAL_COMMUNITY)
Admission: RE | Disposition: A | Discharge status: Home / Self Care | Payer: Self-pay | Source: Home / Self Care | Attending: Cardiology

## 2024-01-31 ENCOUNTER — Ambulatory Visit (HOSPITAL_COMMUNITY)
Admission: RE | Admit: 2024-01-31 | Discharge: 2024-01-31 | Disposition: A | Discharge status: Home / Self Care | Attending: Cardiology | Admitting: Cardiology

## 2024-01-31 DIAGNOSIS — Z9861 Coronary angioplasty status: Secondary | ICD-10-CM

## 2024-01-31 DIAGNOSIS — I2582 Chronic total occlusion of coronary artery: Secondary | ICD-10-CM | POA: Diagnosis not present

## 2024-01-31 DIAGNOSIS — Z955 Presence of coronary angioplasty implant and graft: Secondary | ICD-10-CM | POA: Diagnosis not present

## 2024-01-31 DIAGNOSIS — Z7982 Long term (current) use of aspirin: Secondary | ICD-10-CM | POA: Insufficient documentation

## 2024-01-31 DIAGNOSIS — I252 Old myocardial infarction: Secondary | ICD-10-CM | POA: Insufficient documentation

## 2024-01-31 DIAGNOSIS — Z7985 Long-term (current) use of injectable non-insulin antidiabetic drugs: Secondary | ICD-10-CM | POA: Diagnosis not present

## 2024-01-31 DIAGNOSIS — I2510 Atherosclerotic heart disease of native coronary artery without angina pectoris: Secondary | ICD-10-CM | POA: Diagnosis not present

## 2024-01-31 DIAGNOSIS — Z7984 Long term (current) use of oral hypoglycemic drugs: Secondary | ICD-10-CM | POA: Diagnosis not present

## 2024-01-31 DIAGNOSIS — Y832 Surgical operation with anastomosis, bypass or graft as the cause of abnormal reaction of the patient, or of later complication, without mention of misadventure at the time of the procedure: Secondary | ICD-10-CM | POA: Diagnosis not present

## 2024-01-31 DIAGNOSIS — I110 Hypertensive heart disease with heart failure: Secondary | ICD-10-CM | POA: Insufficient documentation

## 2024-01-31 DIAGNOSIS — E119 Type 2 diabetes mellitus without complications: Secondary | ICD-10-CM | POA: Insufficient documentation

## 2024-01-31 DIAGNOSIS — T82855A Stenosis of coronary artery stent, initial encounter: Secondary | ICD-10-CM | POA: Diagnosis present

## 2024-01-31 DIAGNOSIS — E7800 Pure hypercholesterolemia, unspecified: Secondary | ICD-10-CM | POA: Insufficient documentation

## 2024-01-31 DIAGNOSIS — I6523 Occlusion and stenosis of bilateral carotid arteries: Secondary | ICD-10-CM | POA: Insufficient documentation

## 2024-01-31 DIAGNOSIS — Z951 Presence of aortocoronary bypass graft: Secondary | ICD-10-CM | POA: Insufficient documentation

## 2024-01-31 DIAGNOSIS — I255 Ischemic cardiomyopathy: Secondary | ICD-10-CM | POA: Diagnosis not present

## 2024-01-31 DIAGNOSIS — I5022 Chronic systolic (congestive) heart failure: Secondary | ICD-10-CM | POA: Diagnosis not present

## 2024-01-31 DIAGNOSIS — Z79899 Other long term (current) drug therapy: Secondary | ICD-10-CM | POA: Diagnosis not present

## 2024-01-31 DIAGNOSIS — Z720 Tobacco use: Secondary | ICD-10-CM | POA: Insufficient documentation

## 2024-01-31 DIAGNOSIS — I25110 Atherosclerotic heart disease of native coronary artery with unstable angina pectoris: Secondary | ICD-10-CM

## 2024-01-31 HISTORY — PX: LEFT HEART CATH AND CORS/GRAFTS ANGIOGRAPHY: CATH118250

## 2024-01-31 LAB — GLUCOSE, CAPILLARY
Glucose-Capillary: 164 mg/dL — ABNORMAL HIGH (ref 70–99)
Glucose-Capillary: 118 mg/dL — ABNORMAL HIGH (ref 70–99)

## 2024-01-31 SURGERY — LEFT HEART CATH AND CORS/GRAFTS ANGIOGRAPHY
Anesthesia: LOCAL

## 2024-01-31 MED ORDER — ACETAMINOPHEN 325 MG PO TABS: 650.0000 mg | ORAL_TABLET | ORAL | Status: DC | PRN

## 2024-01-31 MED ORDER — SODIUM CHLORIDE 0.9% FLUSH: 3.0000 mL | INTRAVENOUS | Status: DC | PRN

## 2024-01-31 MED ORDER — FENTANYL CITRATE (PF) 100 MCG/2ML IJ SOLN
INTRAMUSCULAR | Status: DC | PRN
  Administered 2024-01-31: 25 ug via INTRAVENOUS

## 2024-01-31 MED ORDER — ONDANSETRON HCL 4 MG/2ML IJ SOLN: 4.0000 mg | Freq: Four times a day (QID) | INTRAMUSCULAR | Status: DC | PRN

## 2024-01-31 MED ORDER — ASPIRIN 81 MG PO CHEW: 81.0000 mg | CHEWABLE_TABLET | ORAL | Status: DC

## 2024-01-31 MED ORDER — SODIUM CHLORIDE 0.9% FLUSH: 3.0000 mL | Freq: Two times a day (BID) | INTRAVENOUS | Status: DC

## 2024-01-31 MED ORDER — SODIUM CHLORIDE 0.9 % IV SOLN
INTRAVENOUS | Status: DC | PRN
  Administered 2024-01-31: 1000 mL via INTRA_ARTERIAL

## 2024-01-31 MED ORDER — MIDAZOLAM HCL 2 MG/2ML IJ SOLN
INTRAMUSCULAR | Status: AC
  Filled 2024-01-31: qty 2

## 2024-01-31 MED ORDER — IOHEXOL 350 MG/ML SOLN
INTRAVENOUS | Status: DC | PRN
  Administered 2024-01-31: 35 mL

## 2024-01-31 MED ORDER — MIDAZOLAM HCL 2 MG/2ML IJ SOLN
INTRAMUSCULAR | Status: DC | PRN
  Administered 2024-01-31: 2 mg via INTRAVENOUS

## 2024-01-31 MED ORDER — FREE WATER: 250.0000 mL | Freq: Once | Status: DC

## 2024-01-31 MED ORDER — FENTANYL CITRATE (PF) 100 MCG/2ML IJ SOLN
INTRAMUSCULAR | Status: AC
  Filled 2024-01-31: qty 2

## 2024-01-31 MED ORDER — SODIUM CHLORIDE 0.9 % IV SOLN: 250.0000 mL | INTRAVENOUS | Status: DC | PRN

## 2024-01-31 MED ORDER — LIDOCAINE HCL (PF) 1 % IJ SOLN
INTRAMUSCULAR | Status: DC | PRN
  Administered 2024-01-31: 12 mL

## 2024-01-31 MED ORDER — LIDOCAINE HCL (PF) 1 % IJ SOLN
INTRAMUSCULAR | Status: AC
  Filled 2024-01-31: qty 30

## 2024-01-31 SURGICAL SUPPLY — 12 items
CATH INFINITI 5 FR IM (CATHETERS) IMPLANT
CATH INFINITI 5 FR LCB (CATHETERS) IMPLANT
CATH INFINITI 5FR AL1 (CATHETERS) IMPLANT
CATH INFINITI 5FR MPB2 (CATHETERS) IMPLANT
CLOSURE MYNX CONTROL 5F (Vascular Products) IMPLANT
PACK CARDIAC CATHETERIZATION (CUSTOM PROCEDURE TRAY) ×2 IMPLANT
SET ATX-X65L (MISCELLANEOUS) IMPLANT
SHEATH PINNACLE 5F 10CM (SHEATH) IMPLANT
SHEATH PROBE COVER 6X72 (BAG) IMPLANT
WIRE EMERALD 3MM-J .035X150CM (WIRE) IMPLANT
WIRE EMERALD 3MM-J .035X260CM (WIRE) IMPLANT
WIRE MICRO SET SILHO 5FR 7 (SHEATH) IMPLANT

## 2024-01-31 NOTE — Progress Notes (Signed)
 Patient and grandson was given discharge instructions. Both verbalized understanding.

## 2024-01-31 NOTE — Interval H&P Note (Signed)
 History and Physical Interval Note:  01/31/2024 9:26 AM  [name]  has presented today for surgery, with the diagnosis of CAD.  The various methods of treatment have been discussed with the patient and family. After consideration of risks, benefits and other options for treatment, the patient has consented to  Procedure(s): LEFT HEART CATH AND CORS/GRAFTS ANGIOGRAPHY (N/A) and possible coronary angioplasty for high risk nuclear stress test as a surgical intervention.  The patient's history has been reviewed, patient examined, no change in status, stable for surgery.  I have reviewed the patient's chart and labs.  Questions were answered to the patient's satisfaction.    Cath Lab Visit (complete for each Cath Lab visit)  Clinical Evaluation Leading to the Procedure:   ACS: No.  Non-ACS:    Anginal Classification: CCS II  Anti-ischemic medical therapy: Maximal Therapy (2 or more classes of medications)  Non-Invasive Test Results: High-risk stress test findings: cardiac mortality >3%/year  Prior CABG: Previous CABG   Gordy Bergamo

## 2024-02-01 ENCOUNTER — Encounter (HOSPITAL_COMMUNITY): Payer: Self-pay | Admitting: Cardiology

## 2024-02-02 ENCOUNTER — Other Ambulatory Visit: Payer: Self-pay

## 2024-02-03 ENCOUNTER — Ambulatory Visit: Payer: Self-pay | Admitting: Allergy

## 2024-02-14 ENCOUNTER — Ambulatory Visit: Attending: Cardiovascular Disease | Admitting: Cardiovascular Disease

## 2024-02-14 ENCOUNTER — Encounter: Payer: Self-pay | Admitting: Cardiovascular Disease

## 2024-02-14 VITALS — BP 104/68 | HR 86 | Ht 63.0 in | Wt 142.0 lb

## 2024-02-14 DIAGNOSIS — I255 Ischemic cardiomyopathy: Secondary | ICD-10-CM | POA: Insufficient documentation

## 2024-02-14 DIAGNOSIS — I25110 Atherosclerotic heart disease of native coronary artery with unstable angina pectoris: Secondary | ICD-10-CM | POA: Insufficient documentation

## 2024-02-14 DIAGNOSIS — E785 Hyperlipidemia, unspecified: Secondary | ICD-10-CM | POA: Diagnosis present

## 2024-02-14 DIAGNOSIS — I6523 Occlusion and stenosis of bilateral carotid arteries: Secondary | ICD-10-CM | POA: Insufficient documentation

## 2024-02-14 DIAGNOSIS — I10 Essential (primary) hypertension: Secondary | ICD-10-CM | POA: Insufficient documentation

## 2024-02-14 NOTE — Progress Notes (Signed)
 02/14/2024 [name]   1970-11-15  [id number]  Primary Physician Theotis Haze ORN, NP Primary Cardiologist: Dorn JINNY Lesches MD FACP, [place], [place], FSCAI  HPI:  [name] is a 53 y.o.     moderately overweight African-American female with history of tobacco abuse, diabetes, hypertension, hyperlipidemia and medication noncompliance. I last saw her in the office 08/16/23.  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  She's had RCA intervention in the past with re-intervention on 09/07/14. She had accelerated angina and underwent repeat cardiac catheterization by myself 02/01/15 revealing three-vessel disease with moderate LV dysfunction.  She underwent coronary artery bypass grafting X 3 by Dr. Maude Salinas Trigt 02/07/15 with a LIMA to LAD, vein to obtuse marginal branch and PDA. Several days postop she had a PEA arrest requiring resuscitation, intubation and placement of an intra-aortic balloon pump. Ultimately she was discharged home. She is recuperating nicely. She did stop smoking. Her most recent 2-D echo performed 08/19/15 revealed EF of 45-50%. Since I saw her back she's remained clinically stable. She does not smoke. She eats a relatively healthy diet according to her.  She did have a Myoview  stress test performed 03/08/2018 that was remarkable for inferolateral scar.   She has had issues with difficult to control hypertension. She did see Hao Meng PA-in the clinic since her last office visit with me who initiated lisinopril  with escalating doses.   Since I saw her in the office 6 months ago she was evaluated for progressive shortness of breath and chest pain.  She did have an echo that showed stable EF of 30% and a cardiac PET study performed 01/12/2024 that showed evidence of ischemia felt to be high risk.  Based on this she underwent left heart cath by Dr. Ladona 01/31/2024 revealing occluded vein graft, patent LIMA to LAD diffusely diseased LAD and occluded native vessels.  There  were no interventional options and he thought cardiac transplantation was her only option in the future if she remained symptomatic.   Current Meds  Medication Sig   acetaminophen  (TYLENOL ) 500 MG tablet Take 500 mg by mouth every 6 (six) hours as needed for moderate pain.   aspirin  EC 81 MG tablet Take 1 tablet (81 mg total) by mouth daily.   atorvastatin  (LIPITOR ) 40 MG tablet Take 1 tablet (40 mg total) by mouth daily.   carvedilol  (COREG ) 25 MG tablet Take 0.5 tablets (12.5 mg total) by mouth 2 (two) times daily.   dapagliflozin  propanediol (FARXIGA ) 10 MG TABS tablet Take 1 tablet (10 mg total) by mouth daily before breakfast.   famotidine  (PEPCID ) 20 MG tablet Take 1 tablet (20 mg total) by mouth 2 (two) times daily.   levocetirizine (XYZAL ) 5 MG tablet Take 1 tablet (5 mg total) by mouth every evening.   metFORMIN  (GLUCOPHAGE ) 500 MG tablet Take 2 tablets (1,000 mg total) by mouth 2 (two) times daily with a meal.   nitroGLYCERIN  (NITROSTAT ) 0.4 MG SL tablet Place 1 tablet (0.4 mg total) under the tongue every 5 (five) minutes as needed for chest pain.   Omega-3 Fatty Acids (FISH OIL ) 1200 MG CAPS Take 1,200 mg by mouth at bedtime.   omeprazole  (PRILOSEC) 40 MG capsule TAKE 1 CAPSULE (40 MG TOTAL) BY MOUTH DAILY.   sacubitril -valsartan  (ENTRESTO ) 49-51 MG Take 1 tablet by mouth 2 (two) times daily.   Semaglutide ,0.25 or 0.5MG /DOS, (OZEMPIC , 0.25 OR 0.5 MG/DOSE,) 2 MG/3ML SOPN Inject 0.5 mg into the skin  once a week.   spironolactone  (ALDACTONE ) 25 MG tablet Take 0.5 tablets (12.5 mg total) by mouth daily.     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 - Patient should NOT receive heparin  (02/10/15 and 02/12/15)    Social History   Socioeconomic History   Marital status: Single    Spouse name: Not on file   Number of children: 2   Years of education: Not on file   Highest education level: Not on file  Occupational History    Occupation: CNA    Employer: [employer]  Tobacco Use   Smoking status: Former    Current packs/day: 0.00    Types: Cigarettes    Quit date: 01/09/1993    Years since quitting: 31.1    Passive exposure: Past   Smokeless tobacco: Never  Vaping Use   Vaping status: Never Used  Substance and Sexual Activity   Alcohol  use: No    Alcohol /week: 0.0 standard drinks of alcohol    Drug use: No   Sexual activity: Not on file  Other Topics Concern   Not on file  Social History Narrative   Not on file   Social Drivers of Health   Financial Resource Strain: Patient Declined (03/03/2023)   Overall Financial Resource Strain (CARDIA)    Difficulty of Paying Living Expenses: Patient declined  Food Insecurity: No Food Insecurity (03/03/2023)   Hunger Vital Sign    Worried About Running Out of Food in the Last Year: Never true    Ran Out of Food in the Last Year: Never true  Transportation Needs: No Transportation Needs (03/03/2023)   PRAPARE - Administrator, Civil Service (Medical): No    Lack of Transportation (Non-Medical): No  Physical Activity: Inactive (03/03/2023)   Exercise Vital Sign    Days of Exercise per Week: 0 days    Minutes of Exercise per Session: 0 min  Stress: No Stress Concern Present (03/03/2023)   [employer] of Occupational Health - Occupational Stress Questionnaire    Feeling of Stress : Not at all  Social Connections: Socially Isolated (03/03/2023)   Social Connection and Isolation Panel    Frequency of Communication with Friends and Family: Once a week    Frequency of Social Gatherings with Friends and Family: Once a week    Attends Religious Services: Never    Database administrator or Organizations: No    Attends Banker Meetings: Never    Marital Status: Never married  Intimate Partner Violence: Not At Risk (03/03/2023)   Humiliation, Afraid, Rape, and Kick questionnaire    Fear of Current or Ex-Partner: No     Emotionally Abused: No    Physically Abused: No    Sexually Abused: No     Review of Systems: General: negative for chills, fever, night sweats or weight changes.  Cardiovascular: negative for chest pain, dyspnea on exertion, edema, orthopnea, palpitations, paroxysmal nocturnal dyspnea or shortness of breath Dermatological: negative for rash Respiratory: negative for cough or wheezing Urologic: negative for hematuria Abdominal: negative for nausea, vomiting, diarrhea, bright red blood per rectum, melena, or hematemesis Neurologic: negative for visual changes, syncope, or dizziness All other systems reviewed and are otherwise negative except as noted above.    Blood pressure 104/68, pulse 86, height 5' 3 (1.6 m), weight 142 lb (64.4 kg), SpO2 99%.  General appearance: alert and no distress Neck: no adenopathy, no  carotid bruit, no JVD, supple, symmetrical, trachea midline, and thyroid not enlarged, symmetric, no tenderness/mass/nodules Lungs: clear to auscultation bilaterally Heart: regular rate and rhythm, S1, S2 normal, no murmur, click, rub or gallop Extremities: extremities normal, atraumatic, no cyanosis or edema Pulses: 2+ and symmetric Skin: Skin color, texture, turgor normal. No rashes or lesions Neurologic: Grossly normal  EKG not performed today      ASSESSMENT AND PLAN:   Essential hypertension History of essential hypertension with blood pressure measured at 104/68.  She is on carvedilol  and Entresto .  Hyperlipidemia with target LDL less than 70 History of hyperlipidemia on statin therapy with lipid profile performed 01/21/2024 revealing total cholesterol of 111, LDL of 51 and HDL 44.  S/P CABG x 3 History of CAD status post RCA intervention in the past with reintervention/29/16.  I performed cardiac catheterization on her 02/01/2015 revealing three-vessel disease and moderate LV dysfunction.  She ultimately underwent CABG x 3 9/29//16 by Dr. Obadiah with a LIMA to  LAD, vein to an obtuse marginal branch and PDA.  Because of progressive dyspnea and chest pain she had a recent cardiac PET study 9//16 which was high risk and subset underwent left heart cath by Dr. Ladona 01/31/2024 revealing a patent LIMA to the LAD with diffuse disease beyond LIMA insertion, occluded vein grafts and occluded native vessels with an EF of 30%.  Dr. Ladona felt that she would be a candidate for cardiac transplantation if necessary in the future.  She currently is relatively asymptomatic.  Ischemic cardiomyopathy History of ischemic cardiomyopathy with an EF in the 30% range by recent 2D echo 09/17/2023 on GDMT.  She is minimally symptomatic.  Carotid artery disease (HCC) History of carotid artery disease with Doppler performed 10/20/2023 revealing moderate right ICA stenosis.  This to be repeated on an annual basis.     Dorn DOROTHA Lesches MD FACP,FACC,FAHA, [facility] 02/14/2024 2:43 PM

## 2024-02-14 NOTE — Assessment & Plan Note (Signed)
 History of CAD status post RCA intervention in the past with reintervention/29/16.  I performed cardiac catheterization on her 02/01/2015 revealing three-vessel disease and moderate LV dysfunction.  She ultimately underwent CABG x 3 9/29//16 by Dr. Obadiah with a LIMA to LAD, vein to an obtuse marginal branch and PDA.  Because of progressive dyspnea and chest pain she had a recent cardiac PET study 9//16 which was high risk and subset underwent left heart cath by Dr. Ladona 01/31/2024 revealing a patent LIMA to the LAD with diffuse disease beyond LIMA insertion, occluded vein grafts and occluded native vessels with an EF of 30%.  Dr. Ladona felt that she would be a candidate for cardiac transplantation if necessary in the future.  She currently is relatively asymptomatic.

## 2024-02-14 NOTE — Assessment & Plan Note (Signed)
 History of carotid artery disease with Doppler performed 10/20/2023 revealing moderate right ICA stenosis.  This to be repeated on an annual basis.

## 2024-02-14 NOTE — Assessment & Plan Note (Signed)
 History of hyperlipidemia on statin therapy with lipid profile performed 01/21/2024 revealing total cholesterol of 111, LDL of 51 and HDL 44.

## 2024-02-14 NOTE — Assessment & Plan Note (Signed)
 History of essential hypertension with blood pressure measured at 104/68.  She is on carvedilol  and Entresto .

## 2024-02-14 NOTE — Assessment & Plan Note (Signed)
 History of ischemic cardiomyopathy with an EF in the 30% range by recent 2D echo 09/17/2023 on GDMT.  She is minimally symptomatic.

## 2024-02-14 NOTE — Patient Instructions (Addendum)
 Thank you for choosing [facility]!     No medication changes were made during today's visit.    *If you need a refill on your cardiac medications before your next appointment, please call your pharmacy*   Lab Work: No labs were ordered during today's visit.  If you have labs (blood work) drawn today and your tests are completely normal, you will receive your results only by: MyChart Message (if you have MyChart) OR A paper copy in the mail If you have any lab test that is abnormal or we need to change your treatment, we will call you to review the results.   Testing/Procedures: No procedures were ordered during today's visit.   Your next appointment:   3 month(s)   Provider:   Lum Louis, NP  or Aline Door, [place]    Then, Dorn Lesches, MD will plan to see you again in 6 month(s).     Follow-Up: At [facility], you and your health needs are our priority.  As part of our continuing mission to provide you with exceptional heart care, we have created designated Provider Care Teams.  These Care Teams include your primary Cardiologist (physician) and Advanced Practice Providers (APPs -  Physician Assistants and Nurse Practitioners) who all work together to provide you with the care you need, when you need it. We recommend signing up for the patient portal called MyChart.  Sign up information is provided on this After Visit Summary.  MyChart is used to connect with patients for Virtual Visits (Telemedicine).  Patients are able to view lab/test results, encounter notes, upcoming appointments, etc.  Non-urgent messages can be sent to your provider as well.   To learn more about what you can do with MyChart, go to [email or web address].

## 2024-02-17 ENCOUNTER — Other Ambulatory Visit: Payer: Self-pay | Admitting: Nurse Practitioner

## 2024-02-17 ENCOUNTER — Other Ambulatory Visit: Payer: Self-pay

## 2024-02-17 DIAGNOSIS — E118 Type 2 diabetes mellitus with unspecified complications: Secondary | ICD-10-CM

## 2024-02-18 ENCOUNTER — Other Ambulatory Visit: Payer: Self-pay

## 2024-02-18 MED ORDER — OZEMPIC (0.25 OR 0.5 MG/DOSE) 2 MG/3ML ~~LOC~~ SOPN
0.5000 mg | PEN_INJECTOR | SUBCUTANEOUS | 1 refills | Status: DC
Start: 2024-02-18 — End: 2024-04-05
  Filled 2024-02-18: qty 3, 28d supply, fill #0
  Filled 2024-03-23: qty 3, 28d supply, fill #1

## 2024-02-25 ENCOUNTER — Other Ambulatory Visit: Payer: Self-pay

## 2024-02-29 ENCOUNTER — Other Ambulatory Visit: Payer: Self-pay

## 2024-03-01 ENCOUNTER — Ambulatory Visit: Admitting: Nurse Practitioner

## 2024-03-07 ENCOUNTER — Ambulatory Visit (HOSPITAL_COMMUNITY)
Admission: RE | Admit: 2024-03-07 | Discharge: 2024-03-07 | Disposition: A | Discharge status: Home / Self Care | Source: Ambulatory Visit | Attending: Cardiovascular Disease | Admitting: Cardiovascular Disease

## 2024-03-07 ENCOUNTER — Ambulatory Visit: Admitting: Student

## 2024-03-07 DIAGNOSIS — I6523 Occlusion and stenosis of bilateral carotid arteries: Secondary | ICD-10-CM | POA: Diagnosis not present

## 2024-03-08 ENCOUNTER — Ambulatory Visit: Payer: Self-pay | Admitting: Cardiovascular Disease

## 2024-03-13 ENCOUNTER — Other Ambulatory Visit: Payer: Self-pay

## 2024-03-13 NOTE — Progress Notes (Unsigned)
 Electrophysiology Office Note:   Date:  03/16/2024  ID:  [name], DOB [birth year], MRN [id number]  Primary Cardiologist: Dorn Lesches, MD Electrophysiologist: Fonda Kitty, MD      History of Present Illness:   [name] is a 53 y.o. female with h/o tobacco abuse, diabetes, hypertension, hyperlipidemia, CAD s/p RCA PCI and 3v-CABG in 2016 and chronic systolic heart failure who is being seen today for ICD evaluation at the request of Dr. Lesches.  Discussed the use of AI scribe software for clinical note transcription with the patient, who gave verbal consent to proceed.  History of Present Illness [name] is a 53 year old female with coronary artery disease and reduced ejection fraction who presents for evaluation of a defibrillator. She was referred by Dr. Lesches for evaluation of a defibrillator due to reduced heart pumping function.  She has significant blockages in her coronary arteries, and a heart catheterization revealed no suitable targets for revascularization. Her ejection fraction has decreased to below 35%, which increases her risk for sudden cardiac death due to ventricular arrhythmias.  She has not experienced any known dangerous heart rhythms. She is considering the option of a defibrillator as a preventive measure. She is contemplating the timing of the procedure and prefers to wait until the beginning of the next year to make a decision.  Her family history is significant for cardiovascular disease, with her father having passed away from a stroke and heart attack. No family members have had a defibrillator.  She works as a conservation officer, nature at [employer], which occasionally requires lifting items like soda packs. She recalls having restrictions on lifting after a previous procedure and anticipates similar restrictions if she proceeds with the defibrillator implantation.   Review of systems complete and found to be negative unless listed in HPI.   EP  Information / Studies Reviewed:    EKG is ordered today. Personal review as below.  EKG Interpretation Date/Time:  Tuesday March 14 2024 11:05:13 EST Ventricular Rate:  76 PR Interval:  154 QRS Duration:  106 QT Interval:  390 QTC Calculation: 438 R Axis:   108  Text Interpretation: Normal sinus rhythm Rightward axis ST & T wave abnormality in the inferior and lateral leads. When compared with ECG of 21-Jan-2024 08:26,ST-T changes in the inferolateral leads. Confirmed by Kitty Fonda [phone]) on 03/16/2024 2:19:47 PM   EKG 01/21/24: SR with PR , QRS .   LHC 01/31/24:  Angiographic data: LM: Diffusely diseased with 20 to 30% stenosis. LAD: Severely diffusely diseased.  Ostial/proximal 90% stenosis followed by occlusion after D2.  Distal LAD supplied by LIMA.  Due to is also moderately diseased. LCx: Occluded in the proximal segment.  Faint minimal collaterals are evident from LAD to Cx. RCA: Prior proximal RCA stent severe restenosis of 99% followed by occlusion at the second stent.  Minimal faint collaterals evident from LAD to distal RCA. SVG to PDA and SVG to OM1 are occluded.  LIMA to severely diseased LAD is patent.  Cardiac PET 01/12/24: Findings are consistent with ischemia and infarction. The study is high risk. In addition to areas of scar in the inferior and lateral wall, there is a large area of anterior ischemia and limited peri-infarct lateral wall ischemia. With stress, anterior wall motion worsens and LVEF deteriorates further. Recommend cardiac catheterization.   Echo 09/17/23:  1. Left ventricular ejection fraction, by estimation, is 30%. Left  ventricular ejection fraction by 3D volume is 29 %. The left  ventricle has  severely decreased function. The left ventricle demonstrates regional wall  motion abnormalities (see scoring  diagram/findings for description). Left ventricular diastolic parameters  are consistent with Grade I diastolic dysfunction (impaired  relaxation).  No LV thrombus.   2. Right ventricular systolic function is normal. The right ventricular  size is normal. There is normal pulmonary artery systolic pressure.   3. Diminished left atrial reservoir strain; 13.8%.   4. The mitral valve is normal in structure. Trivial mitral valve  regurgitation. No evidence of mitral stenosis. The mean mitral valve  gradient is 1.3 mmHg with average heart rate of 76 bpm.   5. The aortic valve is tricuspid. Aortic valve regurgitation is not  visualized. No aortic stenosis is present.   6. The inferior vena cava is normal in size with greater than 50%  respiratory variability, suggesting right atrial pressure of 3 mmHg.   Physical Exam:   VS:  BP 122/74   Pulse 76   Ht 5' 3 (1.6 m)   Wt 144 lb (65.3 kg)   BMI 25.51 kg/m    Wt Readings from Last 3 Encounters:  03/14/24 144 lb (65.3 kg)  02/14/24 142 lb (64.4 kg)  01/31/24 140 lb (63.5 kg)     GEN: Well nourished, well developed in no acute distress NECK: No JVD CARDIAC: Normal rate, regular rhythm RESPIRATORY:  Clear to auscultation without rales, wheezing or rhonchi  ABDOMEN: Soft, non-distended EXTREMITIES:  No edema; No deformity   ASSESSMENT AND PLAN:    #Chronic systolic heart failure:  #Ischemic cardiomyopathy: - Patient meets criteria for primary prevention ICD in the setting of ischemic cardiomyopathy and chronic systolic heart failure with LVEF less than 35% despite greater than 3 months of optimal medical therapy. Explained risks, benefits, and alternatives to ICD implantation, including but not limited to bleeding, infection, damage to heart or lungs, heart attack, stroke, or death.  Pt verbalized understanding and proceed.  QRS is narrow.  We discussed both transvenous and subcutaneous devices.  Given her her degree of scar, likelihood of scar mediated VT at some point is high.  Transvenous device would offer ATP therapies.  For now, we are leaning towards a single-chamber  transvenous ICD. - Continue GDMT regimen of carvedilol , dapagliflozin , Entresto , spironolactone .  Close follow-up with primary cardiologist, Dr. Court.  #CAD s/p RCA PCI and CABG: Denies chest pain. - Continue aspirin  and atorvastatin .  Close follow-up with primary cardiologist, Dr. Court.  #Hypertension -At  goal today.  Recommend checking blood pressures 1-2 times per week at home and recording the values.  Recommend bringing these recordings to the primary care physician.  Follow up with Dr. Kennyth 3 months after implant.   Signed, Fonda Kennyth, MD

## 2024-03-14 ENCOUNTER — Ambulatory Visit: Attending: Cardiology | Admitting: Cardiology

## 2024-03-14 ENCOUNTER — Encounter: Payer: Self-pay | Admitting: Cardiology

## 2024-03-14 ENCOUNTER — Other Ambulatory Visit: Payer: Self-pay

## 2024-03-14 VITALS — BP 122/74 | HR 76 | Ht 63.0 in | Wt 144.0 lb

## 2024-03-14 DIAGNOSIS — I2510 Atherosclerotic heart disease of native coronary artery without angina pectoris: Secondary | ICD-10-CM | POA: Insufficient documentation

## 2024-03-14 DIAGNOSIS — I5022 Chronic systolic (congestive) heart failure: Secondary | ICD-10-CM | POA: Diagnosis present

## 2024-03-14 DIAGNOSIS — I255 Ischemic cardiomyopathy: Secondary | ICD-10-CM | POA: Diagnosis present

## 2024-03-14 DIAGNOSIS — I10 Essential (primary) hypertension: Secondary | ICD-10-CM | POA: Insufficient documentation

## 2024-03-14 NOTE — Patient Instructions (Addendum)
 Medication Instructions:  Your physician recommends that you continue on your current medications as directed. Please refer to the Current Medication list given to you today.  *If you need a refill on your cardiac medications before your next appointment, please call your pharmacy*  Lab Work: BMET and CBC - please have these drawn two weeks prior to your procedure at any [facility] location  Testing/Procedures: ICD implant  Your physician has recommended that you have a defibrillator inserted. An implantable cardioverter defibrillator (ICD) is a small device that is placed in your chest or, in rare cases, your abdomen. This device uses electrical pulses or shocks to help control life-threatening, irregular heartbeats that could lead the heart to suddenly stop beating (sudden cardiac arrest). Leads are attached to the ICD that goes into your heart. This is done in the hospital and usually requires an overnight stay.  You are scheduled for an ICD implant on Thursday, January 15 with Dr. Sidra Kitty.Please arrive at the Main Entrance A at [facility]: [address] [place], [place] [address] at 11:30 AM   We will contact you with instructions prior to your procedure.  Follow-Up: At [facility], you and your health needs are our priority.  As part of our continuing mission to provide you with exceptional heart care, our providers are all part of one team.  This team includes your primary Cardiologist (physician) and Advanced Practice Providers or APPs (Physician Assistants and Nurse Practitioners) who all work together to provide you with the care you need, when you need it.  Your next appointment:   We will call you to arrange your post-procedure visits.

## 2024-03-17 ENCOUNTER — Other Ambulatory Visit: Payer: Self-pay

## 2024-03-23 ENCOUNTER — Other Ambulatory Visit (HOSPITAL_COMMUNITY): Payer: Self-pay

## 2024-03-30 ENCOUNTER — Other Ambulatory Visit: Payer: Self-pay

## 2024-03-31 ENCOUNTER — Telehealth: Payer: Self-pay

## 2024-03-31 ENCOUNTER — Other Ambulatory Visit: Payer: Self-pay

## 2024-03-31 MED ORDER — OFLOXACIN 0.3 % OP SOLN
1.0000 [drp] | Freq: Four times a day (QID) | OPHTHALMIC | 1 refills | Status: AC
  Filled 2024-03-31 – 2024-04-13 (×2): qty 5, 25d supply, fill #0

## 2024-03-31 MED ORDER — PREDNISOLONE ACETATE 1 % OP SUSP
OPHTHALMIC | 5 refills | Status: AC
  Filled 2024-03-31 – 2024-04-13 (×2): qty 5, 28d supply, fill #0
  Filled 2024-06-07: qty 5, 14d supply, fill #1

## 2024-03-31 NOTE — Telephone Encounter (Signed)
   Patient Name: [name]  DOB: 1971/02/19 MRN: [id number]  Primary Cardiologist: Dorn Lesches, MD  Chart reviewed as part of pre-operative protocol coverage. Cataract extractions are recognized in guidelines as low risk surgeries that do not typically require specific preoperative testing or holding of blood thinner therapy. Therefore, given past medical history and time since last visit, based on ACC/AHA guidelines, [name] would be at acceptable risk for the planned procedure without further cardiovascular testing.   I will route this recommendation to the requesting party via Epic fax function and remove from pre-op pool.  Please call with questions.  Orren LOISE Fabry, PA-C 03/31/2024, 3:59 PM

## 2024-03-31 NOTE — Telephone Encounter (Signed)
   Pre-operative Risk Assessment   Patient Name: [name]  DOB: 1970/10/18 MRN: [id number]   Date of last office visit: 03/14/24 with Dr.Parker  Date of next office visit: 05/16/24   Request for Surgical Clearance    Procedure:  Cataract Extraction Bt PE. IOL-Left   Date of Surgery:  Clearance 04/14/24                                 Surgeon: Dr. Lonni ONEIDA Fairly Surgeon's Group or Practice Name:  [facility]  Phone number:  [phone] Fax number:  [phone]   Type of Clearance Requested:   - Medical  - Pharmacy:  Hold Aspirin      Type of Anesthesia:  IV Sedation   Additional requests/questions:  None  Signed, Merlynn LITTIE Essex   03/31/2024, 3:41 PM

## 2024-04-05 ENCOUNTER — Encounter: Payer: Self-pay | Admitting: Nurse Practitioner

## 2024-04-05 ENCOUNTER — Other Ambulatory Visit: Payer: Self-pay

## 2024-04-05 ENCOUNTER — Ambulatory Visit: Attending: Nurse Practitioner | Admitting: Nurse Practitioner

## 2024-04-05 VITALS — BP 105/69 | HR 77 | Resp 19 | Ht 63.0 in | Wt 145.0 lb

## 2024-04-05 DIAGNOSIS — L209 Atopic dermatitis, unspecified: Secondary | ICD-10-CM | POA: Diagnosis not present

## 2024-04-05 DIAGNOSIS — Z7182 Exercise counseling: Secondary | ICD-10-CM

## 2024-04-05 DIAGNOSIS — E118 Type 2 diabetes mellitus with unspecified complications: Secondary | ICD-10-CM | POA: Diagnosis not present

## 2024-04-05 DIAGNOSIS — Z1231 Encounter for screening mammogram for malignant neoplasm of breast: Secondary | ICD-10-CM | POA: Diagnosis not present

## 2024-04-05 DIAGNOSIS — I10 Essential (primary) hypertension: Secondary | ICD-10-CM

## 2024-04-05 DIAGNOSIS — L818 Other specified disorders of pigmentation: Secondary | ICD-10-CM

## 2024-04-05 DIAGNOSIS — Z7984 Long term (current) use of oral hypoglycemic drugs: Secondary | ICD-10-CM

## 2024-04-05 DIAGNOSIS — Z713 Dietary counseling and surveillance: Secondary | ICD-10-CM

## 2024-04-05 LAB — POCT GLYCOSYLATED HEMOGLOBIN (HGB A1C): HbA1c, POC (controlled diabetic range): 6.4 % (ref 0.0–7.0)

## 2024-04-05 MED ORDER — DEXCOM G7 RECEIVER DEVI
0 refills | Status: AC
  Filled 2024-04-05: qty 1, 365d supply, fill #0

## 2024-04-05 MED ORDER — DEXCOM G7 SENSOR MISC
6 refills | Status: AC
  Filled 2024-04-05: qty 3, 30d supply, fill #0

## 2024-04-05 MED ORDER — OZEMPIC (0.25 OR 0.5 MG/DOSE) 2 MG/3ML ~~LOC~~ SOPN
0.5000 mg | PEN_INJECTOR | SUBCUTANEOUS | 1 refills | Status: AC
  Filled 2024-04-05 – 2024-04-27 (×2): qty 3, 28d supply, fill #0
  Filled 2024-06-05: qty 3, 28d supply, fill #1

## 2024-04-05 MED ORDER — TRIAMCINOLONE ACETONIDE 0.5 % EX OINT
1.0000 | TOPICAL_OINTMENT | Freq: Two times a day (BID) | CUTANEOUS | 1 refills | Status: AC
  Filled 2024-04-05: qty 60, 30d supply, fill #0

## 2024-04-05 NOTE — Progress Notes (Signed)
 Assessment & Plan:  [name] was seen today for hypertension and diabetes.  Diagnoses and all orders for this visit:  Diabetes mellitus type 2 with complications (HCC) -     POCT glycosylated hemoglobin (Hb A1C) -     Urine Albumin /Creatinine with ratio (send out) [LAB689] -     CMP14+EGFR A1c is 6.4, slightly elevated. Emphasized maintaining A1c under 6.5. Discussed continuous glucose monitoring with Dexcom for better management. - Prescribed Dexcom device and sensors. - Encouraged dietary management to maintain A1c under 6.5.  Primary hypertension -     CMP14+EGFR  Breast cancer screening by mammogram -     MS 3D SCR MAMMO BILAT BR (aka MM); Future  Atopic dermatitis, unspecified type -     triamcinolone  ointment (KENALOG ) 0.5 %; Apply 1 Application topically 2 (two) times daily.  Discoloration of eye -     Ambulatory referral to Dermatology Persistent hyperpigmentation not improving with current treatment. - Referred to dermatology for evaluation and potential light therapy.   Patient has been counseled on age-appropriate routine health concerns for screening and prevention. These are reviewed and up-to-date. Referrals have been placed accordingly. Immunizations are up-to-date or declined.    Subjective:   Chief Complaint  Patient presents with   Hypertension   Diabetes   History of Present Illness [name] is a 53 year old female who presents for f/u HTN/DM and with a rash on her right arm    She has a rash on her  right forearm. She has experienced similar issues in the past, including a rash around her eyes. The duration of the current rash is unspecified, and she is unsure of the cause. She has not used any specific treatments for the current rash yet.    She is requesting a new dexcom sensor and reader. She picked one up last year from the pharmacy.  Her last A1c was 5.9, which has increased slightly today but still in range Lab Results  Component  Value Date   HGBA1C 6.4 04/05/2024     She is scheduled  for ICD placement in January. She recently saw her cardiologist a couple of weeks ago. She is unsure about the implications of having an ICD, particularly regarding the use of microwaves and using a hearting pad on her chest when she has musculoskeletal pain.  HTN Blood pressure at goal and she has been adherent with taking carvedilol  12.5 mg twice daily, Entresto  49-51 mg twice daily and spironolactone  12.5 mg daily.  She is also taking Farxiga  10 mg daily for cardiovascular BP Readings from Last 3 Encounters:  04/05/24 105/69  03/14/24 122/74  02/14/24 104/68      Review of Systems  Constitutional:  Negative for fever, malaise/fatigue and weight loss.  HENT: Negative.  Negative for nosebleeds.   Eyes: Negative.  Negative for blurred vision, double vision and photophobia.  Respiratory: Negative.  Negative for cough and shortness of breath.   Cardiovascular: Negative.  Negative for chest pain, palpitations and leg swelling.  Gastrointestinal: Negative.  Negative for heartburn, nausea and vomiting.  Musculoskeletal: Negative.  Negative for myalgias.  Skin:  Positive for itching and rash.  Neurological: Negative.  Negative for dizziness, focal weakness, seizures and headaches.  Psychiatric/Behavioral: Negative.  Negative for suicidal ideas.     Past Medical History:  Diagnosis Date   CAD (coronary artery disease)    cath 09/07/2014 DES to 95% prox RCA, residual 70-80% origin of AV groove, 90% ostial PDA stenosis; 01/2015,  NSTEMI w/ CABG, LIMA-LAD, SVG-OM1, SVG-PCA   Carotid artery disease    a. 40-59% BICA by duplex 01/2015.   Diabetes mellitus without complication (HCC)    H/O medication noncompliance    Hyperlipidemia    Hypertension    MI (myocardial infarction) (HCC)    Tobacco abuse     Past Surgical History:  Procedure Laterality Date   ABDOMINAL HYSTERECTOMY  2008   ANTERIOR CERVICAL DECOMP/DISCECTOMY FUSION N/A  08/26/2021   Procedure: Anterior Cervical Decompression/Discectomy Fusion Cervical Six-Seven;  Surgeon: Lanis Pupa, MD;  Location: [facility] OR;  Service: Neurosurgery;  Laterality: N/A;  3C   CARDIAC CATHETERIZATION N/A 02/01/2015   Procedure: Left Heart Cath and Coronary Angiography;  Surgeon: Dorn JINNY Lesches, MD;  Location: [facility] INVASIVE CV LAB;  Service: Cardiovascular;  Laterality: N/A;   CARDIAC CATHETERIZATION N/A 02/07/2015   Procedure: IABP Insertion;  Surgeon: Toribio JONELLE Fuel, MD;  Location: [facility] INVASIVE CV LAB;  Service: Cardiovascular;  Laterality: N/A;   CORONARY ARTERY BYPASS GRAFT N/A 02/07/2015   Procedure: CORONARY ARTERY BYPASS GRAFTING (CABG);  Surgeon: Maude Fleeta Ochoa, MD; LIMA-LAD, SVT-OM1, SVG-PDA   LEFT HEART CATH AND CORS/GRAFTS ANGIOGRAPHY N/A 01/31/2024   Procedure: LEFT HEART CATH AND CORS/GRAFTS ANGIOGRAPHY;  Surgeon: Ladona Heinz, MD;  Location: [facility] INVASIVE CV LAB;  Service: Cardiovascular;  Laterality: N/A;   LEFT HEART CATHETERIZATION WITH CORONARY ANGIOGRAM N/A 05/29/2014   Procedure: LEFT HEART CATHETERIZATION WITH CORONARY ANGIOGRAM;  Surgeon: Candyce GORMAN Reek, MD;  Location: [facility] CATH LAB;  Service: Cardiovascular;  Laterality: N/A;   LEFT HEART CATHETERIZATION WITH CORONARY ANGIOGRAM N/A 09/07/2014   Procedure: LEFT HEART CATHETERIZATION WITH CORONARY ANGIOGRAM;  Surgeon: Alm LELON Clay, MD;  Location: [facility] CATH LAB;  Service: Cardiovascular;  Laterality: N/A;   PERCUTANEOUS CORONARY STENT INTERVENTION (PCI-S)  05/29/2014   Procedure: PERCUTANEOUS CORONARY STENT INTERVENTION (PCI-S);  Surgeon: Candyce GORMAN Reek, MD;  Location: [facility] CATH LAB;  Service: Cardiovascular;;   TEE WITHOUT CARDIOVERSION N/A 02/07/2015   Procedure: TRANSESOPHAGEAL ECHOCARDIOGRAM (TEE);  Surgeon: Maude Fleeta Ochoa, MD;  Location: [facility] OR;  Service: Open Heart Surgery;  Laterality: N/A;    Family History  Problem Relation Age of Onset   Hypertension Mother    Kidney failure Mother     Stroke Mother 72   Heart attack Father 6   Heart disease Father    Breast cancer Sister     Social History Reviewed with no changes to be made today.   Outpatient Medications Prior to Visit  Medication Sig Dispense Refill   acetaminophen  (TYLENOL ) 500 MG tablet Take 500 mg by mouth every 6 (six) hours as needed for moderate pain.     aspirin  EC 81 MG tablet Take 1 tablet (81 mg total) by mouth daily. 30 tablet 11   atorvastatin  (LIPITOR ) 40 MG tablet Take 1 tablet (40 mg total) by mouth daily. 90 tablet 3   carvedilol  (COREG ) 25 MG tablet Take 0.5 tablets (12.5 mg total) by mouth 2 (two) times daily. 90 tablet 3   dapagliflozin  propanediol (FARXIGA ) 10 MG TABS tablet Take 1 tablet (10 mg total) by mouth daily before breakfast. 90 tablet 2   famotidine  (PEPCID ) 20 MG tablet Take 1 tablet (20 mg total) by mouth 2 (two) times daily. 30 tablet 5   levocetirizine (XYZAL ) 5 MG tablet Take 1 tablet (5 mg total) by mouth every evening. 30 tablet 5   metFORMIN  (GLUCOPHAGE ) 500 MG tablet Take 2 tablets (1,000 mg total) by mouth 2 (two) times daily  with a meal. 360 tablet 1   nitroGLYCERIN  (NITROSTAT ) 0.4 MG SL tablet Place 1 tablet (0.4 mg total) under the tongue every 5 (five) minutes as needed for chest pain. 25 tablet 10   Omega-3 Fatty Acids (FISH OIL ) 1200 MG CAPS Take 1,200 mg by mouth at bedtime.     omeprazole  (PRILOSEC) 40 MG capsule TAKE 1 CAPSULE (40 MG TOTAL) BY MOUTH DAILY. 90 capsule 1   sacubitril -valsartan  (ENTRESTO ) 49-51 MG Take 1 tablet by mouth 2 (two) times daily. 60 tablet 2   spironolactone  (ALDACTONE ) 25 MG tablet Take 0.5 tablets (12.5 mg total) by mouth daily. 45 tablet 3   Semaglutide ,0.25 or 0.5MG /DOS, (OZEMPIC , 0.25 OR 0.5 MG/DOSE,) 2 MG/3ML SOPN Inject 0.5 mg into the skin once a week. 3 mL 1   ofloxacin  (OCUFLOX ) 0.3 % ophthalmic solution Place 1 drop into the left eye 4 (four) times daily for 1 week, then STOP. (Patient not taking: Reported on 04/05/2024) 5 mL 1    prednisoLONE  acetate (PRED FORTE ) 1 % ophthalmic suspension Place 1 drop into the left eye 4 (four) times a week for 7 days, THEN 1 drop 2 (two) times daily for 7 days, THEN 1 drop daily for 14 days, then STOP. (Patient not taking: No sig reported) 5 mL 5   Continuous Glucose Receiver (DEXCOM G7 RECEIVER) DEVI Use to check blood sugar continuously throughout the day. Change sensors once every 10 days. (Patient not taking: Reported on 04/05/2024) 1 each 0   Continuous Glucose Sensor (DEXCOM G7 SENSOR) MISC Use to check blood sugar continuously throughout the day. Change sensors once every 10 days. (Patient not taking: Reported on 04/05/2024) 3 each 6   No facility-administered medications prior to visit.    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  Constitutional:      Appearance: She is well-developed.  HENT:     Head: Normocephalic and atraumatic.  Cardiovascular:     Rate and Rhythm: Normal rate and regular rhythm.     Heart sounds: Normal heart sounds. No murmur heard.    No friction rub. No gallop.  Pulmonary:     Effort: Pulmonary effort is normal. No tachypnea or respiratory distress.     Breath sounds: Normal breath sounds. No decreased breath sounds, wheezing, rhonchi or rales.  Chest:     Chest wall: No tenderness.  Musculoskeletal:        General: Normal range of motion.     Cervical back: Normal range of motion.  Skin:    General: Skin is warm and dry.     Findings: Rash present. Rash is macular.       Neurological:     Mental Status: She is alert and oriented to person, place, and time.     Coordination: Coordination normal.  Psychiatric:        Behavior: Behavior normal. Behavior  is cooperative.        Thought Content: Thought content normal.        Judgment: Judgment normal.          Patient has been counseled extensively about nutrition and exercise as well as the importance of adherence with medications and regular follow-up. The patient was given clear instructions to go to ER or return to medical center if symptoms don't improve, worsen or new problems develop. The patient verbalized understanding.   Follow-up: Return in about 4 months (around 08/03/2024).   Haze LELON Servant, FNP-BC [facility] [place], [place] [phone]   04/05/2024, 2:28 PM

## 2024-04-07 ENCOUNTER — Other Ambulatory Visit: Payer: Self-pay

## 2024-04-07 ENCOUNTER — Telehealth: Payer: Self-pay

## 2024-04-07 LAB — CMP14+EGFR
ALT: 17 IU/L (ref 0–32)
AST: 17 IU/L (ref 0–40)
Albumin: 4.2 g/dL (ref 3.8–4.9)
Alkaline Phosphatase: 79 IU/L (ref 49–135)
BUN/Creatinine Ratio: 27 — ABNORMAL HIGH (ref 9–23)
BUN: 27 mg/dL — ABNORMAL HIGH (ref 6–24)
Bilirubin Total: <0.2 mg/dL (ref 0.0–1.2)
CO2: 23 mmol/L (ref 20–29)
Calcium: 9.9 mg/dL (ref 8.7–10.2)
Chloride: 103 mmol/L (ref 96–106)
Creatinine, Ser: 1.01 mg/dL — ABNORMAL HIGH (ref 0.57–1.00)
Globulin, Total: 2.3 g/dL (ref 1.5–4.5)
Glucose: 126 mg/dL — ABNORMAL HIGH (ref 70–99)
Potassium: 5.4 mmol/L — ABNORMAL HIGH (ref 3.5–5.2)
Sodium: 137 mmol/L (ref 134–144)
Total Protein: 6.5 g/dL (ref 6.0–8.5)
eGFR: 67 mL/min/1.73 (ref >59)

## 2024-04-07 LAB — MICROALBUMIN / CREATININE URINE RATIO
Creatinine, Urine: 74.2 mg/dL
Microalb/Creat Ratio: <4 mg/g{creat} (ref 0–29)
Microalbumin, Urine: <3 ug/mL

## 2024-04-07 NOTE — Telephone Encounter (Signed)
 Pharmacy Patient Advocate Encounter  Received notification from [facility] [employer] that Prior Authorization for [facility] G7 [facility] has been DENIED.  Full denial letter will be uploaded to the media tab. See denial reason below.   PA #/Case ID/Reference #: [id number]  Here are the policy requirements your request did not meet:   You must have insulin -dependent diabetes   You must use an insulin  pump, or your doctor tells us  all the following are true:  o You or the person who helps you are willing and able to use this device  o You met with your doctor to discuss your blood sugar control within the last six months  **OZEMPIC  THERAPY IS NOT RECOGNIZE BY [employer] AS 'INSULIN -DEPENDENT DIABETES'

## 2024-04-09 ENCOUNTER — Ambulatory Visit: Payer: Self-pay | Admitting: Nurse Practitioner

## 2024-04-11 ENCOUNTER — Other Ambulatory Visit: Payer: Self-pay

## 2024-04-12 ENCOUNTER — Other Ambulatory Visit: Payer: Self-pay | Admitting: Student

## 2024-04-13 ENCOUNTER — Other Ambulatory Visit: Payer: Self-pay

## 2024-04-17 ENCOUNTER — Other Ambulatory Visit: Payer: Self-pay

## 2024-04-17 ENCOUNTER — Telehealth: Payer: Self-pay | Admitting: Cardiology

## 2024-04-17 MED ORDER — SACUBITRIL-VALSARTAN 49-51 MG PO TABS
1.0000 | ORAL_TABLET | Freq: Two times a day (BID) | ORAL | 3 refills | Status: AC
  Filled 2024-04-17: qty 180, 90d supply, fill #0

## 2024-04-17 NOTE — Telephone Encounter (Signed)
 Refills has been sent to the pharmacy.

## 2024-04-17 NOTE — Telephone Encounter (Signed)
*  STAT* If patient is at the pharmacy, call can be transferred to refill team.   1. Which medications need to be refilled? (please list name of each medication and dose if known) sacubitril -valsartan  (ENTRESTO ) 49-51 MG    2. Would you like to learn more about the convenience, safety, & potential cost savings by using the [facility]?      3. Are you open to using the [facility] (Type [facility].  ).   4. Which pharmacy/location (including street and city if local pharmacy) is medication to be sent to? [facility]    5. Do they need a 30 day or 90 day supply? 90 day

## 2024-04-18 ENCOUNTER — Telehealth: Payer: Self-pay

## 2024-04-18 ENCOUNTER — Other Ambulatory Visit: Payer: Self-pay

## 2024-04-18 DIAGNOSIS — I5022 Chronic systolic (congestive) heart failure: Secondary | ICD-10-CM

## 2024-04-18 DIAGNOSIS — I255 Ischemic cardiomyopathy: Secondary | ICD-10-CM

## 2024-04-18 NOTE — Telephone Encounter (Signed)
-----   Message from Nurse Carlyle C sent at 03/20/2024  5:01 PM EST ----- Regarding: 05/25/24 ICD implant Precert:  MD: Kennyth Type of implant: ICD Device manufacturer: [employer] Diagnosis: CHF CPT code: ICD implant - 33249 C-code(s), including quantity (if indicated): C1722, [id number] Procedure scheduled (date/time): 1/15 at 1  Procedure:  Scrub given? Yes  Medication instructions: farxiga  and ozempic  routine hold. Can stay on ASA.  Message sent to CVRR? No Added to calendar? Yes Orders entered? Yes Letter complete? No, >30 days before procedure Scheduled with cath lab? Yes Labs ordered (CBC, BMET, PT/INR if on warfarin)? No Dye allergy? No Pre-meds ordered and instructions given? No, >30 days before procedure Letter method: MyChart Special instructions: n/a H&P: 11/10  Follow-up:  Cassie/Angel, please schedule Routine.  Covering RN:  Please send this message to [employer], EP scheduler, EP Scheduling pool, and EP [employer].

## 2024-04-27 ENCOUNTER — Other Ambulatory Visit: Payer: Self-pay

## 2024-05-07 NOTE — Progress Notes (Unsigned)
 "  Cardiology Office Note:    Date:  05/07/2024   ID:  [name], DOB [birth year], MRN [id number]  PCP:  Theotis Haze ORN, NP  Cardiologist:  Dorn Lesches, MD Electrophysiologist:  Fonda Kitty, MD { Click to update primary MD,subspecialty MD or APP then REFRESH:1}    Referring MD: Theotis Haze ORN, NP   Chief Complaint: follow-up of CAD and CHF  History of Present Illness:    [name] is a 53 y.o. female with a history of CAD s/p multiple interventions (DES to proximal LCX and balloon angiplasty to mid LCX as well as DES to RCA in 05/2014 in setting of NSTEMI, DES to RCA in 08/2014, and then CABG x3 (LIMA-LAD, SVG-OM,SVG-PDA) in 01/2015), ischemic cardiomyopathy / chronic HFrEF with EF of 30% on Echo in 09/2023, bilateral carotid stenosis (right > left), hypertension, hyperlipidemia, type 2 diabetes mellitus, medication non-compliance, and tobacco abuse who is followed by Dr. Lesches and presents today for follow-up of CAD and CHF.      CAD - Admitted in 05/2014 for NSTEMI. LHC showed multivessel disease. S/p DES to proximal LCX and balloon angioplasty to mid LCX as well as DES to RCA. Echo showed LVEF of 55-60%.  - Admitted in 08/2014 for unstable angina. LHC showed 90% stenosis of proximal RCA s/p DES. - Admitted in 01/2015 again for unstable angina. LHC showed patent stents to the proximal to mid RCA but 95% stenosis of distal RCA, 99% stenosis of ostial LCX, and 70% stenosis of proximal LAD. S/p CABG x3 with LIMA-LAD, SVG-OM1, SVG-PDA. Post-op course complicated by PEA arrest, ventricular tachycardia, and cardiogenic shock requiring placement of intra-aortic balloon pump and inotropic support.  - Myoview  02/2018 showed was high risk with large defect in the basal inferior, basal inferolateral, mid inferior, mid inferolateral, and apical inferior location consistent with prior infarct but not ischemia.  - Cardiac PET stress test in 01/2024 was high risk and showed findings  consistent with ischemia and infarction. This led to a LHC later that month and showed severe native 3 vessel CAD with patent LIMA to LAD but occluded SVG to PDA and SVG to OM1. There were no options for PCI.    Ischemic Cardiomyopathy Chronic HFrEF - EF was normal at 55-60% during admission for NSTEMI in 05/2014.  - EF mildly low at 45-50% in 02/2014 after CABG.  - EF has slowly been down-trending since 2019.  - Last Echo in 09/2023 showed LVEF of 30% with akinesis of the entire lateral wall and apical inferior segment and hypokinesis of the entire wall, entire septum, and inferior wall as well as grade 1 diastolic dysfunction. Normal RV size and function.   Carotid Stenosis - First diagnosed on pre-CABG dopplers in 2016. Found to have 40-59% stenosis of bilateral ICAs at that time.  - Repeat dopplers in 01/2017 and 01/2018 showed only 1-39% stenosis of bilateral ICAs.  - Last dopplers in 02/2024 showed 40-59% stenosis of right ICA, 1-39% stenosis of left ICA, antegrade flow through bilateral vertebral arteries, and normal flow hemodynamics in bilateral subclavian arteries.     Patient is primarily followed by Cardiology for CAD and CHF as detailed above in the Narrative History. She has had multiple PCIs and then a CABG x3 in 2016. Since 2019, EF has been slowly down trending. Last Echo in 09/2023 showed LVEF of 30% with multiple wall motion abnormalities. At visit in 09/2023, she reported dyspnea on exertion and chest tightness when she over-exerts herself. Cardiac  PET stress test was ordered. This was completed in 01/2024 and was high risk with findings consistent with ischemia and infarction. Subsequent cardiac catheterization later that month showed severe native 3 vessel CAD with patent LIMA to LAD but occluded SVG to PDA and SVG to OM1. There were no PCI options and it was felt that she may need a cardiac transplant if unable to manage symptoms.   She was last seen by Dr. Court in 02/2024 and  thankfully was only having minimal symptoms. She was referred to EP for consideration of ICD and saw Dr. Kennyth in 03/2024 who did recommend a ICD. This is scheduled for 05/25/2024:  Patient presents today for follow-up. ****  CAD s/p CABG Patient has a history of extensive CAD with multiple PCIs in early 2016 and then CABG x3 in 01/2015. Recent LHC in 01/2024 after a high risk cardiac PET stress test showed evere 3 vessel native CAD with patent LIMA to LAD but occluded SVG to PDA and SVG to OM1. There were no PCI options and it was felt that she may need a cardiac transplant if unable to manage symptoms.  - *** - Continue Aspirin  81mg  daily and Lipitor  40mg  daily.   Chronic HFrEF Ischemic Cardiomyopathy EF has gradually declined over the last few years. Last Echo in 09/2203 showed LVEF of 30% with akinesis of the entire lateral wall and apical inferior segment and hypokinesis of the entire wall, entire septum, and inferior wall.  - Euvolemic on exam. *** - Continue Entresto  97-103mg  twice daily.  - Continue Coreg  25mg  twice daily. Provided refill of this today. - Continue Spironolactone  25mg  daily.  - Continue Farxiga  10mg  daily.  - Continue daily weights and sodium restrictions. - She has been seen by EP and plan is for ICD for primary prevention of sudden cardiac death later this month.  Bilateral Carotid Stenosis  Last carotid dopplers in 02/2024 showed 40-59% stenosis of right ICA and 1-39% stenosis of left ICA.  - Continue Aspirin  and Lipitor  as above.  - Will need repeat dopplers in 02/2025  Hypertension BP *** - Will treat in context of CHF as above.    Hyperlipidemia Lipid panel in 01/2024: Total Cholesterol 111, Triglycerides 76, HDL 44, LDL 51. LDL goal <55 given CAD.  - Continue Lipitor  40mg  daily.    Type 2 Diabetes Mellitus Hemoglobin A1c 6.4% in 03/2024.  - On Metformin , Farxiga , and Ozempic .  - Management per PCP.  EKGs/Labs/Other Studies Reviewed:    The following  studies were reviewed:  Echocardiogram 09/17/2023: Impressions: 1. Left ventricular ejection fraction, by estimation, is 30%. Left  ventricular ejection fraction by 3D volume is 29 %. The left ventricle has  severely decreased function. The left ventricle demonstrates regional wall  motion abnormalities (see scoring  diagram/findings for description). Left ventricular diastolic parameters  are consistent with Grade I diastolic dysfunction (impaired relaxation).  No LV thrombus.   2. Right ventricular systolic function is normal. The right ventricular  size is normal. There is normal pulmonary artery systolic pressure.   3. Diminished left atrial reservoir strain; 13.8%.   4. The mitral valve is normal in structure. Trivial mitral valve  regurgitation. No evidence of mitral stenosis. The mean mitral valve  gradient is 1.3 mmHg with average heart rate of 76 bpm.   5. The aortic valve is tricuspid. Aortic valve regurgitation is not  visualized. No aortic stenosis is present.   6. The inferior vena cava is normal in size with greater than  50%  respiratory variability, suggesting right atrial pressure of 3 mmHg.   _______________  Cardiac PET Stress Test 01/12/2024:   Findings are consistent with ischemia and infarction. The study is high risk. In addition to areas of scar in the inferior and lateral wall, there is a large area of anterior ischemia and limited peri-infarct lateral wall ischemia. With stress, anterior wall motion worsens and LVEF deteriorates further. Recommend cardiac catheterization.   LV perfusion is abnormal. There is evidence of ischemia. There is evidence of infarction. Defect 1: There is a large defect with severe reduction in uptake present in the apical to basal inferior and inferolateral location(s) that is fixed. There is abnormal wall motion in the defect area. Consistent with infarction. The defect is consistent with abnormal perfusion in the RCA territory. Defect 2:  There is a medium defect with moderate reduction in uptake present in the apical to basal anterolateral location(s) that is partially reversible. There is abnormal wall motion in the defect area. Consistent with infarction and peri-infarct ischemia. The defect is consistent with abnormal perfusion in the LCx territory. Defect 3: There is a medium defect with severe reduction in uptake present in the mid to basal anterior location(s). There is normal wall motion in the defect area. Consistent with ischemia. The defect is consistent with abnormal perfusion in the LAD territory.   Rest left ventricular function is abnormal. Rest global function is moderately reduced. There were multiple regional abnormalities. Rest EF: 38%. Stress left ventricular function is abnormal. Stress global function is severely reduced. There were multiple regional abnormalities. Stress EF: 32%. End diastolic cavity size is normal. End systolic cavity size is normal.   Myocardial blood flow was computed to be 0.77ml/g/min at rest and 1.12ml/g/min at stress. Global myocardial blood flow reserve was 1.60 and was abnormal.   Coronary calcium  assessment not performed due to prior revascularization. _______________  Left Cardiac Catheterization 01/31/2024: Hemodynamic Data: LV 106/16, EDP 3 mmHg.  Ao 05/11/2008/52, mean 75 mmHg.  No pressure gradient across the aortic valve.   Angiographic Data: LM: Diffusely diseased with 20 to 30% stenosis. LAD: Severely diffusely diseased.  Ostial/proximal 90% stenosis followed by occlusion after D2.  Distal LAD supplied by LIMA.  Due to is also moderately diseased. LCx: Occluded in the proximal segment.  Faint minimal collaterals are evident from LAD to Cx. RCA: Prior proximal RCA stent severe restenosis of 99% followed by occlusion at the second stent.  Minimal faint collaterals evident from LAD to distal RCA. SVG to PDA and SVG to OM1 are occluded.  LIMA to severely diseased LAD is patent.       Impression and Recommendations: Findings are consistent with ischemic cardiomyopathy with no options for any revascularization with severe native vessel coronary disease.  Patient eventually will need to be evaluated for transplant. _______________  Carotid Dopplers 03/07/2024: Summary:  - Right Carotid: Velocities in the right ICA are consistent with a 40-59%  stenosis. The ECA appears <50% stenosed.  - Left Carotid: Velocities in the left ICA are consistent with a 1-39%  stenosis.  - Vertebrals: Bilateral vertebral arteries demonstrate antegrade flow.  - Subclavians: Normal flow hemodynamics were seen in bilateral subclavian  arteries.    EKG:  EKG not ordered today.   Recent Labs: 01/21/2024: Hemoglobin 11.3; Platelets 225; TSH 1.670 04/05/2024: ALT 17; BUN 27; Creatinine, Ser 1.01; Potassium 5.4; Sodium 137  Recent Lipid Panel    Component Value Date/Time   CHOL 111 01/21/2024 0916   TRIG 76  01/21/2024 0916   HDL 44 01/21/2024 0916   CHOLHDL 2.5 01/21/2024 0916   CHOLHDL 3.0 02/03/2015 0235   VLDL 18 02/03/2015 0235   LDLCALC 51 01/21/2024 0916    Physical Exam:    Vital Signs: There were no vitals taken for this visit.    Wt Readings from Last 3 Encounters:  04/05/24 145 lb (65.8 kg)  03/14/24 144 lb (65.3 kg)  02/14/24 142 lb (64.4 kg)     General: 53 y.o. female in no acute distress. HEENT: Normocephalic and atraumatic. Sclera clear.  Neck: Supple. No carotid bruits. No JVD. Heart: *** RRR. Distinct S1 and S2. No murmurs, gallops, or rubs.  Lungs: No increased work of breathing. Clear to ausculation bilaterally. No wheezes, rhonchi, or rales.  Abdomen: Soft, non-distended, and non-tender to palpation.  Extremities: No lower extremity edema.  Radial and distal pedal pulses 2+ and equal bilaterally. Skin: Warm and dry. Neuro: No focal deficits. Psych: Normal affect. Responds appropriately.   Assessment:    No diagnosis found.  Plan:     Disposition:  Follow up in ***   Signed, Aline FORBES Door, PA-C  05/07/2024 9:57 AM    [facility] "

## 2024-05-08 ENCOUNTER — Telehealth (HOSPITAL_COMMUNITY): Payer: Self-pay

## 2024-05-08 ENCOUNTER — Encounter (HOSPITAL_COMMUNITY): Payer: Self-pay

## 2024-05-08 NOTE — Telephone Encounter (Signed)
 Attempted to reach patient to discuss upcoming procedure, no answer. Left VM for patient to return call.

## 2024-05-08 NOTE — Telephone Encounter (Signed)
 Spoke with patient to discuss upcoming procedure.   Confirmed patient is scheduled for a Implantable cardioverter defibrilator (ICD) on Thursday, January 15 with Dr. Dr. Kennyth. Instructed patient to arrive at the Main Entrance A at [facility]: [address] [place], [place] [address] and check in at Admitting at 11:30 AM.   Labs to be completed  Any recent signs of acute illness or been started on antibiotics? No  Any new medications started? No Any medications to hold? Yes. HOLD: Semaglutide  (Ozempic , Rybelsus , Wegovy ) for 1 week prior to the procedure. Last dose on or before Wednesday, January 07.  HOLD: Dapagliflozin  (Farxiga ) for 3 days prior to the procedure. Last dose on Sunday, January 11.  Medication instructions:  On the morning of your procedure HOLD medications listed above with ALSO Metformin  and Spironolactone .  No eating or drinking after midnight prior to procedure.   The night before your procedure and the morning of your procedure, wash thoroughly with the CHG surgical soap from the neck down, paying special attention to the area where your procedure will be performed.  Plan to go home the same day, you will only stay overnight if medically necessary. You MUST have a responsible adult to drive you home and MUST be with you the first 24 hours after you arrive home.  Informed patient a nurse will call a day before the procedure to confirm arrival time and ensure instructions are followed.  Patient verbalized understanding to all instructions provided and agreed to proceed with procedure.

## 2024-05-12 ENCOUNTER — Other Ambulatory Visit: Payer: Self-pay

## 2024-05-13 NOTE — Progress Notes (Unsigned)
 " Cardiology Office Note   Date: 05/15/2024  ID:  [name] Aug 12, 1970 [id number] PCP: Theotis Haze ORN, NP  [facility] Providers Cardiologist: Dorn Lesches, MD Electrophysiologist:  Fonda Kitty, MD     Chief Complaint: [name] is a 54 y.o.female with PMH of CAD s/p multiple interventions including CABGx3 (LIMA-LAD, SVG-OM, SVG-PDA) in 01/2015, ICM, HFrEF with LVEF 30% on echo 09/17/2023, bilateral carotid stenosis, hypertension, hyperlipidemia, T2DM, tobacco abuse who presents to the clinic for routine follow-up.    Ms. Korf has had multiple PCIs, ultimately followed by CABGx3 (LIMA-LAD, SVG-OM, SVG-PDA) in 2016.  Postoperatively she developed a PEA arrest, VT, and cardiogenic shock, requiring an IABP.  She was eventually discharged in stable position. Since 2019 her LVEF has been slowly trending down.  Most recent echo 09/2023 showed LVEF of 30% with multiple WMAs.  Cardiac PET stress 01/12/2024 consistent with ischemia and infarction, read as high risk.  Subsequent cardiac catheterization 01/31/2024 showed severe native CAD as well as two occluded grafts.  No intervention was completed.  She was seen in the office by Dr. Lesches on 02/14/2024 and was referred to electrophysiology for evaluation of ICD implant.  This is currently scheduled for 05/25/2024.   History of Present Illness: Today she is doing okay, she did take one nitroglycerin  for chest pain last week prior to leaving her house to go to work with notable improvement in symptoms. This is the only time she has had to do this recently. She gets short of breath with significant exertion, such as heavy yardwork. This has not changed recently. She chronically sleeps propped up with 4 pillows, but this has been the case since CABG in 2016. She has been limiting her salt intake. She does not check her weight at home, but does check her BP and it is generally 100s/80s. Her only complaint is that she has significant pain in  her lower legs with ambulation that improves with rest.   ROS: Denies orthopnea, PND, lower extremity edema, palpitations, lightheadedness, dizziness, syncope, abnormal bleeding, UTIs/yeast infections.  Studies Reviewed: The following studies were reviewed today: Cardiac Studies & Procedures   ______________________________________________________________________________________________ CARDIAC CATHETERIZATION  CARDIAC CATHETERIZATION 01/31/2024  Conclusion Images from the original result were not included. Cardiac Catheterization 01/31/24: Hemodynamic data: LV 106/16, EDP 3 mmHg.  Ao 05/11/2008/52, mean 75 mmHg.  No pressure gradient across the aortic valve.  Angiographic data: LM: Diffusely diseased with 20 to 30% stenosis. LAD: Severely diffusely diseased.  Ostial/proximal 90% stenosis followed by occlusion after D2.  Distal LAD supplied by LIMA.  Due to is also moderately diseased. LCx: Occluded in the proximal segment.  Faint minimal collaterals are evident from LAD to Cx. RCA: Prior proximal RCA stent severe restenosis of 99% followed by occlusion at the second stent.  Minimal faint collaterals evident from LAD to distal RCA. SVG to PDA and SVG to OM1 are occluded.  LIMA to severely diseased LAD is patent.    Impression and recommendations: Findings are consistent with ischemic cardiomyopathy with no options for any revascularization with severe native vessel coronary disease.  Patient eventually will need to be evaluated for transplant.  Findings Coronary Findings Diagnostic  Dominance: Right  Left Main Mid LM to Ost LAD lesion is 30% stenosed.  Left Anterior Descending There is severe diffuse disease throughout the vessel. Ost LAD to Prox LAD lesion is 90% stenosed. Mid LAD lesion is 100% stenosed.  First Diagonal Branch There is moderate disease in the vessel.  Left  Circumflex Ost Cx to Prox Cx lesion is 100% stenosed. The lesion was previously treated .  Right  Coronary Artery Prox RCA lesion is 99% stenosed. The lesion was previously treated . Mid RCA lesion is 100% stenosed. The lesion was previously treated .  Right Posterior Descending Artery Ost RPDA to RPDA lesion is 95% stenosed.  LIMA Graft To Dist LAD  Graft To Dist RCA Origin to Prox Graft lesion is 100% stenosed.  Graft To 2nd Mrg Origin to Prox Graft lesion is 100% stenosed.  Intervention  No interventions have been documented.   CARDIAC CATHETERIZATION  CARDIAC CATHETERIZATION 02/07/2015  Conclusion Successful bedside IABP placement in the setting of recurrent cardiac arrest.  Bensimhon, Daniel,MD 7:30 PM   STRESS TESTS  NM PET CT CARDIAC PERFUSION MULTI W/ABSOLUTE BLOODFLOW 01/12/2024  Narrative   Findings are consistent with ischemia and infarction. The study is high risk. In addition to areas of scar in the inferior and lateral wall, there is a large area of anterior ischemia and limited peri-infarct lateral wall ischemia. With stress, anterior wall motion worsens and LVEF deteriorates further. Recommend cardiac catheterization.   LV perfusion is abnormal. There is evidence of ischemia. There is evidence of infarction. Defect 1: There is a large defect with severe reduction in uptake present in the apical to basal inferior and inferolateral location(s) that is fixed. There is abnormal wall motion in the defect area. Consistent with infarction. The defect is consistent with abnormal perfusion in the RCA territory. Defect 2: There is a medium defect with moderate reduction in uptake present in the apical to basal anterolateral location(s) that is partially reversible. There is abnormal wall motion in the defect area. Consistent with infarction and peri-infarct ischemia. The defect is consistent with abnormal perfusion in the LCx territory. Defect 3: There is a medium defect with severe reduction in uptake present in the mid to basal anterior location(s). There is normal wall  motion in the defect area. Consistent with ischemia. The defect is consistent with abnormal perfusion in the LAD territory.   Rest left ventricular function is abnormal. Rest global function is moderately reduced. There were multiple regional abnormalities. Rest EF: 38%. Stress left ventricular function is abnormal. Stress global function is severely reduced. There were multiple regional abnormalities. Stress EF: 32%. End diastolic cavity size is normal. End systolic cavity size is normal.   Myocardial blood flow was computed to be 0.75ml/g/min at rest and 1.12ml/g/min at stress. Global myocardial blood flow reserve was 1.60 and was abnormal.   Coronary calcium  assessment not performed due to prior revascularization.   Electronically signed by Jerel Balding, MD  EXAM: OVER-READ INTERPRETATION CARDIAC CT CHEST  The following report is an over-read performed by radiologist Dr. Ryan Salvage of [facility] on 01/12/2024. This over-read does not include interpretation of cardiac or coronary anatomy or pathology. The cardiac and PET interpretation by the cardiologist is attached or will be attached.  COMPARISON:  None.  FINDINGS: Extracardiac Vascular: Aortic atherosclerosis.  Mediastinum: Unremarkable on cross-sectional images, questionable prominence of the left upper mediastinum on the scout image.  Lung: 3 mm left upper lobe subpleural nodule on image 30 series 4.  Included Upper Abdomen: Unremarkable  Musculoskeletal: Prior median sternotomy.  IMPRESSION: 1. 3 mm left upper lobe subpleural nodule. No follow-up needed if patient is low-risk.This recommendation follows the consensus statement: Guidelines for Management of Incidental Pulmonary Nodules Detected on CT Images: From the Fleischner Society 2017; Radiology 2017; 284:228-243. 2. Questionable prominence of the  left upper mediastinum on the scout image. Consider dedicated two view chest radiography to exclude  the unlikely possibility of a left upper mediastinal mass or left upper lobe mass. 3.  Aortic Atherosclerosis (ICD10-I70.0).   Electronically Signed By: Ryan Salvage M.D. On: 01/12/2024 14:56   ECHOCARDIOGRAM  ECHOCARDIOGRAM COMPLETE 09/17/2023  Narrative ECHOCARDIOGRAM REPORT    Patient Name:   [name] Date of Exam: 09/17/2023 Medical Rec #:  [id number]         Height:       63.0 in Accession #:    [id number]        Weight:       165.8 lb Date of Birth:  [birth year]         BSA:          1.786 m Patient Age:    52 years          BP:           127/76 mmHg Patient Gender: F                 HR:           76 bpm. Exam Location:  [address]  Procedure: 2D Echo, Cardiac Doppler, Color Doppler and 3D Echo (Both Spectral and Color Flow Doppler were utilized during procedure).  Indications:    Coronary artery disease I25.10; Ischemic Cardiomyopathy  History:        Patient has prior history of Echocardiogram examinations, most recent 10/02/2022. Cardiomyopathy, Previous Myocardial Infarction, Prior CABG; Risk Factors:Diabetes, Hypertension and Dyslipidemia.  Sonographer:    Augustin Seals RDCS Referring Phys: [phone] JONATHAN J BERRY  IMPRESSIONS   1. Left ventricular ejection fraction, by estimation, is 30%. Left ventricular ejection fraction by 3D volume is 29 %. The left ventricle has severely decreased function. The left ventricle demonstrates regional wall motion abnormalities (see scoring diagram/findings for description). Left ventricular diastolic parameters are consistent with Grade I diastolic dysfunction (impaired relaxation). No LV thrombus. 2. Right ventricular systolic function is normal. The right ventricular size is normal. There is normal pulmonary artery systolic pressure. 3. Diminished left atrial reservoir strain; 13.8%. 4. The mitral valve is normal in structure. Trivial mitral valve regurgitation. No evidence of mitral stenosis. The mean mitral  valve gradient is 1.3 mmHg with average heart rate of 76 bpm. 5. The aortic valve is tricuspid. Aortic valve regurgitation is not visualized. No aortic stenosis is present. 6. The inferior vena cava is normal in size with greater than 50% respiratory variability, suggesting right atrial pressure of 3 mmHg.  Comparison(s): No significant change from prior study. Prior images reviewed side by side.  FINDINGS Left Ventricle: Left ventricular ejection fraction, by estimation, is 30%. Left ventricular ejection fraction by 3D volume is 29 %. The left ventricle has severely decreased function. The left ventricle demonstrates regional wall motion abnormalities. Strain was performed and the global longitudinal strain is indeterminate. The left ventricular internal cavity size was normal in size. There is no left ventricular hypertrophy. Left ventricular diastolic parameters are consistent with Grade I diastolic dysfunction (impaired relaxation).   LV Wall Scoring: The entire lateral wall and apical inferior segment are akinetic. The entire anterior wall, entire septum, and inferior wall are hypokinetic. No LV thrombus.  Right Ventricle: The right ventricular size is normal. No increase in right ventricular wall thickness. Right ventricular systolic function is normal. There is normal pulmonary artery systolic pressure. The tricuspid regurgitant velocity is 2.14 m/s, and with an assumed  right atrial pressure of 3 mmHg, the estimated right ventricular systolic pressure is 21.3 mmHg.  Left Atrium: Diminished left atrial reservoir strain; 13.8%. Left atrial size was normal in size.  Right Atrium: Right atrial size was normal in size.  Pericardium: There is no evidence of pericardial effusion.  Mitral Valve: The mitral valve is normal in structure. Trivial mitral valve regurgitation. No evidence of mitral valve stenosis. The mean mitral valve gradient is 1.3 mmHg with average heart rate of 76  bpm.  Tricuspid Valve: The tricuspid valve is normal in structure. Tricuspid valve regurgitation is mild . No evidence of tricuspid stenosis.  Aortic Valve: The aortic valve is tricuspid. Aortic valve regurgitation is not visualized. No aortic stenosis is present.  Pulmonic Valve: The pulmonic valve was normal in structure. Pulmonic valve regurgitation is not visualized. No evidence of pulmonic stenosis.  Aorta: The aortic root and ascending aorta are structurally normal, with no evidence of dilitation.  Venous: The inferior vena cava is normal in size with greater than 50% respiratory variability, suggesting right atrial pressure of 3 mmHg.  IAS/Shunts: The atrial septum is grossly normal.  Additional Comments: 3D was performed not requiring image post processing on an independent workstation and was abnormal.   LEFT VENTRICLE PLAX 2D LVIDd:         5.50 cm         Diastology LVIDs:         5.00 cm         LV e' medial:    4.90 cm/s LV PW:         0.90 cm         LV E/e' medial:  14.1 LV IVS:        0.80 cm         LV e' lateral:   10.60 cm/s LVOT diam:     2.00 cm         LV E/e' lateral: 6.5 LV SV:         35 LV SV Index:   20 LVOT Area:     3.14 cm        3D Volume EF LV 3D EF:    Left ventricul LV Volumes (MOD)                            ar LV vol d, MOD    133.0 ml                   ejection A2C:                                        fraction LV vol d, MOD    122.0 ml                   by 3D A4C:                                        volume is LV vol s, MOD    92.2 ml                    29 %. A2C: LV vol s, MOD    80.8 ml A4C:  3D Volume EF: LV SV MOD A2C:   40.8 ml       3D EF:        29 % LV SV MOD A4C:   122.0 ml      LV EDV:       130 ml LV SV MOD BP:    41.3 ml       LV ESV:       92 ml LV SV:        37 ml  RIGHT VENTRICLE RV Basal diam:  2.70 cm RV Mid diam:    1.80 cm RV S prime:     5.33 cm/s TAPSE (M-mode): 1.3 cm  LEFT  ATRIUM             Index        RIGHT ATRIUM          Index LA diam:        3.50 cm 1.96 cm/m   RA Area:     9.79 cm LA Vol (A2C):   38.8 ml 21.73 ml/m  RA Volume:   19.80 ml 11.09 ml/m LA Vol (A4C):   31.3 ml 17.53 ml/m LA Biplane Vol: 40.1 ml 22.46 ml/m AORTIC VALVE LVOT Vmax:   51.90 cm/s LVOT Vmean:  34.100 cm/s LVOT VTI:    0.112 m  AORTA Ao Root diam: 2.80 cm Ao Asc diam:  3.40 cm  MITRAL VALVE               TRICUSPID VALVE MV Area (PHT): 4.89 cm    TR Peak grad:   18.3 mmHg MV Mean grad:  1.3 mmHg    TR Vmax:        214.00 cm/s MV Decel Time: 155 msec MV E velocity: 69.00 cm/s  SHUNTS MV A velocity: 89.90 cm/s  Systemic VTI:  0.11 m MV E/A ratio:  0.77        Systemic Diam: 2.00 cm  Stanly Leavens MD Electronically signed by Stanly Leavens MD Signature Date/Time: 09/17/2023/1:40:08 PM    Final   TEE  ECHO TEE 02/07/2015  Narrative *[facility]* *[facility]* [address] [place], [place] [address] [phone]  ------------------------------------------------------------------- Intraoperative Transesophageal Echocardiography  Patient:    Loeta, Herst MR #:       [id number] Study Date: 02/07/2015 Gender:     F Age:        34 Height: Weight: BSA: Pt. Status: Room:  ATTENDING    Obadiah Maude TISA Obadiah Maude BARTON    VanTrigt, Peter PERFORMING   Marney Needle MD SONOGRAPHER  Augustin Seals  cc:  ------------------------------------------------------------------- LV EF: 45% -   50%  ------------------------------------------------------------------- Study Conclusions  - Left ventricle: Systolic function was mildly reduced. The estimated ejection fraction was in the range of 45% to 50%. Hypokinesis of the inferior myocardium. - Aortic valve: No evidence of vegetation. - Mitral valve: No evidence of vegetation. - Left atrium: No evidence of thrombus in the atrial cavity  or appendage. - Right atrium: No evidence of thrombus in the atrial cavity or appendage. - Atrial septum: No defect or patent foramen ovale was identified. - Pulmonic valve: No evidence of vegetation.  Intraoperative transesophageal echocardiography.  Birthdate: Patient birthdate: 11/17/1970.  Age:  Patient is 54 yr old.  Sex: Gender: female.  Study date:  Study date: 02/07/2015. Study time: 07:39 AM.  -------------------------------------------------------------------  ------------------------------------------------------------------- Left ventricle:  Systolic function was mildly reduced. The estimated ejection  fraction was in the range of 45% to 50%. Regional wall motion abnormalities:   Hypokinesis of the inferior myocardium.  ------------------------------------------------------------------- Aortic valve:   Structurally normal valve. Trileaflet. Cusp separation was normal.  No evidence of vegetation.  Doppler:  There was no regurgitation.  ------------------------------------------------------------------- Aorta:  The aorta was normal, not dilated, and non-diseased.  ------------------------------------------------------------------- Mitral valve:   Structurally normal valve.   Leaflet separation was normal.  No evidence of vegetation.  Doppler:  There was trivial regurgitation.  ------------------------------------------------------------------- Left atrium:  The atrium was normal in size.  No evidence of thrombus in the atrial cavity or appendage.  ------------------------------------------------------------------- Atrial septum:  No defect or patent foramen ovale was identified.  ------------------------------------------------------------------- Right ventricle:  The cavity size was normal. Wall thickness was normal. Systolic function was normal.  ------------------------------------------------------------------- Pulmonic valve:    Structurally normal valve.    Cusp separation was normal.  No evidence of vegetation.  ------------------------------------------------------------------- Tricuspid valve:   Doppler:  There was mild regurgitation.  ------------------------------------------------------------------- Right atrium:  The atrium was normal in size.  No evidence of thrombus in the atrial cavity or appendage.  ------------------------------------------------------------------- Post bypass:  - LV global systolic function was unchanged from the prior stage. - No evidence for new LV regional wall motion abnormalities.  ------------------------------------------------------------------- Post procedure conclusions Ascending Aorta:  - The aorta was normal, not dilated, and non-diseased.  ------------------------------------------------------------------- Prepared and Electronically Authenticated by  Epifanio Fallow 2016-09-29T17:05:22  MONITORS  CARDIAC EVENT MONITOR 05/17/2015  Narrative 1. SR       ______________________________________________________________________________________________                        Physical Exam: VS: BP 112/60   Pulse 78   Ht 5' 3 (1.6 m)   Wt 145 lb 3.2 oz (65.9 kg)   SpO2 97%   BMI 25.72 kg/m  Wt Readings from Last 3 Encounters:  05/15/24 145 lb 3.2 oz (65.9 kg)  04/05/24 145 lb (65.8 kg)  03/14/24 144 lb (65.3 kg)    GEN: Well nourished, in NAD HEENT: Normal NECK: No JVD CARDIAC: RRR, no murmurs, rubs, gallops RESPIRATORY: Clear to auscultation bilaterally ABDOMEN: Soft, non-tender, non-distended MUSCULOSKELETAL: No edema SKIN: Warm and dry, 1+ PT pulses NEUROLOGIC:  Alert and oriented x 3 PSYCHIATRIC:  Normal affect   Assessment & Plan: 1. HFrEF/ICM: LVEF 30% on most recent echo 09/17/2023, unchanged from prior. She denies HF symptoms, only dyspneic with significant exertion such as heavy yardwork. Appears euvolemic on exam today. - Ambulatory referral to AHF  Clinic to establish care given limited options - Check BMET today - Continue carvedilol  12.5 mg twice daily - Continue Farxiga  10 mg daily - Continue Entresto  49-51 mg twice daily - Continue spironolactone  12.5 mg daily - may need to stop this pending potassium level  2. CAD: LHC 01/31/2024 showed severe native CAD as well as 2 occluded bypass grafts, LIMA-LAD patent.  Revascularization options limited. She had one episode of chest discomfort last week prior to leaving for work, for which she took a nitroglycerin  and symptoms resolved. She denies anginal symptoms today.  - Check CBC today - Continue aspirin  81 mg daily - Continue carvedilol  12.5 mg twice daily - Continue atorvastatin  40 mg daily - Continue SL nitroglycerin  as needed - If she has increasing episodes of chest pain, could consider Imdur in the future. Given isolated episode and low normal BP, will hold at this time.   3. Intermittent claudication: Has noticed  increased pain in her lower legs with ambulation that improves with rest. This limits her ability to exercise. Pulses palpable but diminished.  - Check ABIs and bilateral arterial dopplers  4. Hypertension: BP today 112/60 , 100s/80s at home. She denies dizziness, syncope. - Treated in the setting of HFrEF as above  5. Hyperlipidemia: 01/21/2024 LDL 51, HDL 44, TGs 76, total 111.  04/06/2023 AST 17, ALT 17. - Continue atorvastatin  40 mg daily  6. Carotid stenosis: Carotid duplex 03/07/2024 noted left ICA 1-39% stenosis and right ICA 40-59% stenosis. Denies symptoms. - Follow annually per Dr. Court   Dispo: Follow-up in 3-4 months with Dr. Court or sooner if needed.  Signed, Saddie GORMAN Cleaves, NP 05/15/2024 4:19 PM [facility] "

## 2024-05-15 ENCOUNTER — Ambulatory Visit: Attending: Student

## 2024-05-15 ENCOUNTER — Other Ambulatory Visit: Payer: Self-pay

## 2024-05-15 VITALS — BP 112/60 | HR 78 | Ht 63.0 in | Wt 145.2 lb

## 2024-05-15 DIAGNOSIS — I10 Essential (primary) hypertension: Secondary | ICD-10-CM | POA: Diagnosis not present

## 2024-05-15 DIAGNOSIS — I6523 Occlusion and stenosis of bilateral carotid arteries: Secondary | ICD-10-CM | POA: Diagnosis not present

## 2024-05-15 DIAGNOSIS — I25810 Atherosclerosis of coronary artery bypass graft(s) without angina pectoris: Secondary | ICD-10-CM | POA: Insufficient documentation

## 2024-05-15 DIAGNOSIS — I739 Peripheral vascular disease, unspecified: Secondary | ICD-10-CM | POA: Insufficient documentation

## 2024-05-15 DIAGNOSIS — I5020 Unspecified systolic (congestive) heart failure: Secondary | ICD-10-CM | POA: Insufficient documentation

## 2024-05-15 DIAGNOSIS — E782 Mixed hyperlipidemia: Secondary | ICD-10-CM | POA: Diagnosis not present

## 2024-05-15 NOTE — Patient Instructions (Addendum)
 Thank you for choosing [facility]!     Medication Instructions:  Hibiclens  given during today's visit *If you need a refill on your cardiac medications before your next appointment, please call your pharmacy*   Lab Work: No labs were ordered during today's visit.  If you have labs (blood work) drawn today and your tests are completely normal, you will receive your results only by: MyChart Message (if you have MyChart) OR A paper copy in the mail If you have any lab test that is abnormal or we need to change your treatment, we will call you to review the results.   Testing/Procedures: Your physician has requested that you have a lower or upper extremity arterial duplex. This test is an ultrasound of the arteries in the legs or arms. It looks at arterial blood flow in the legs and arms. Allow one hour for Lower and Upper Arterial scans. There are no restrictions or special instructions.  Your physician has requested that you have an ankle brachial index (ABI). During this test an ultrasound and blood pressure cuff are used to evaluate the arteries that supply the arms and legs with blood. Allow thirty minutes for this exam. There are no restrictions or special instructions.  Please note: We ask at that you not bring children with you during ultrasound (echo/ vascular) testing. Due to room size and safety concerns, children are not allowed in the ultrasound rooms during exams. Our front office staff cannot provide observation of children in our lobby area while testing is being conducted. An adult accompanying a patient to their appointment will only be allowed in the ultrasound room at the discretion of the ultrasound technician under special circumstances. We apologize for any inconvenience.   Your next appointment:   3-4 month(s)   Provider:   Dr. Court    Follow-Up: At [facility], you and your health needs are our priority.  As part of our continuing mission to  provide you with exceptional heart care, we have created designated Provider Care Teams.  These Care Teams include your primary Cardiologist (physician) and Advanced Practice Providers (APPs -  Physician Assistants and Nurse Practitioners) who all work together to provide you with the care you need, when you need it. We recommend signing up for the patient portal called MyChart.  Sign up information is provided on this After Visit Summary.  MyChart is used to connect with patients for Virtual Visits (Telemedicine).  Patients are able to view lab/test results, encounter notes, upcoming appointments, etc.  Non-urgent messages can be sent to your provider as well.   To learn more about what you can do with MyChart, go to [email or web address].

## 2024-05-16 ENCOUNTER — Ambulatory Visit: Admitting: Student

## 2024-05-16 LAB — CBC
Hematocrit: 37.6 % (ref 34.0–46.6)
Hemoglobin: 12.5 g/dL (ref 11.1–15.9)
MCH: 30 pg (ref 26.6–33.0)
MCHC: 33.2 g/dL (ref 31.5–35.7)
MCV: 90 fL (ref 79–97)
Platelets: 157 x10E3/uL (ref 150–450)
RBC: 4.17 x10E6/uL (ref 3.77–5.28)
RDW: 13.7 % (ref 11.7–15.4)
WBC: 5 x10E3/uL (ref 3.4–10.8)

## 2024-05-16 LAB — BASIC METABOLIC PANEL WITH GFR
BUN/Creatinine Ratio: 22 (ref 9–23)
BUN: 22 mg/dL (ref 6–24)
CO2: 19 mmol/L — ABNORMAL LOW (ref 20–29)
Calcium: 9.2 mg/dL (ref 8.7–10.2)
Chloride: 104 mmol/L (ref 96–106)
Creatinine, Ser: 1 mg/dL (ref 0.57–1.00)
Glucose: 114 mg/dL — ABNORMAL HIGH (ref 70–99)
Potassium: 4.7 mmol/L (ref 3.5–5.2)
Sodium: 138 mmol/L (ref 134–144)
eGFR: 68 mL/min/1.73

## 2024-05-20 ENCOUNTER — Ambulatory Visit: Payer: Self-pay | Admitting: Cardiology

## 2024-05-24 NOTE — Pre-Procedure Instructions (Signed)
Instructed patient on the following items: Arrival time 1130 Nothing to eat or drink after midnight No meds AM of procedure Responsible person to drive you home and stay with you for 24 hrs Wash with special soap night before and morning of procedure  

## 2024-05-25 ENCOUNTER — Ambulatory Visit (HOSPITAL_COMMUNITY)

## 2024-05-25 ENCOUNTER — Encounter (HOSPITAL_COMMUNITY): Payer: Self-pay | Admitting: Cardiology

## 2024-05-25 ENCOUNTER — Other Ambulatory Visit: Payer: Self-pay

## 2024-05-25 ENCOUNTER — Ambulatory Visit (HOSPITAL_COMMUNITY)
Admission: RE | Admit: 2024-05-25 | Discharge: 2024-05-25 | Disposition: A | Attending: Cardiology | Admitting: Cardiology

## 2024-05-25 ENCOUNTER — Ambulatory Visit (HOSPITAL_COMMUNITY)
Admission: RE | Disposition: A | Discharge status: Home / Self Care | Payer: Self-pay | Source: Home / Self Care | Attending: Cardiology

## 2024-05-25 DIAGNOSIS — Z951 Presence of aortocoronary bypass graft: Secondary | ICD-10-CM | POA: Insufficient documentation

## 2024-05-25 DIAGNOSIS — E785 Hyperlipidemia, unspecified: Secondary | ICD-10-CM | POA: Insufficient documentation

## 2024-05-25 DIAGNOSIS — I5022 Chronic systolic (congestive) heart failure: Secondary | ICD-10-CM | POA: Diagnosis not present

## 2024-05-25 DIAGNOSIS — I2510 Atherosclerotic heart disease of native coronary artery without angina pectoris: Secondary | ICD-10-CM | POA: Diagnosis not present

## 2024-05-25 DIAGNOSIS — Z720 Tobacco use: Secondary | ICD-10-CM | POA: Diagnosis not present

## 2024-05-25 DIAGNOSIS — I255 Ischemic cardiomyopathy: Secondary | ICD-10-CM

## 2024-05-25 DIAGNOSIS — Z79899 Other long term (current) drug therapy: Secondary | ICD-10-CM | POA: Insufficient documentation

## 2024-05-25 DIAGNOSIS — I110 Hypertensive heart disease with heart failure: Secondary | ICD-10-CM | POA: Insufficient documentation

## 2024-05-25 DIAGNOSIS — E119 Type 2 diabetes mellitus without complications: Secondary | ICD-10-CM | POA: Diagnosis not present

## 2024-05-25 HISTORY — PX: ICD IMPLANT: EP1208

## 2024-05-25 LAB — GLUCOSE, CAPILLARY
Glucose-Capillary: 143 mg/dL — ABNORMAL HIGH (ref 70–99)
Glucose-Capillary: 99 mg/dL (ref 70–99)

## 2024-05-25 MED ORDER — SODIUM CHLORIDE 0.9 % IV SOLN: Freq: Once | INTRAVENOUS | Status: DC

## 2024-05-25 MED ORDER — CHLORHEXIDINE GLUCONATE 4 % EX SOLN
4.0000 | Freq: Once | CUTANEOUS | Status: DC
  Filled 2024-05-25: qty 60

## 2024-05-25 MED ORDER — SODIUM CHLORIDE 0.9 % IV SOLN
INTRAVENOUS | Status: AC
  Filled 2024-05-25: qty 2

## 2024-05-25 MED ORDER — CEFAZOLIN SODIUM-DEXTROSE 2-4 GM/100ML-% IV SOLN
2.0000 g | INTRAVENOUS | Status: AC
  Administered 2024-05-25: 2 g via INTRAVENOUS

## 2024-05-25 MED ORDER — SODIUM CHLORIDE 0.9 % IV SOLN: INTRAVENOUS | Status: DC

## 2024-05-25 MED ORDER — SODIUM CHLORIDE 0.9 % IV SOLN
Freq: Once | INTRAVENOUS | Status: AC
  Administered 2024-05-25: 500 mL
  Filled 2024-05-25: qty 10

## 2024-05-25 MED ORDER — CEFAZOLIN SODIUM-DEXTROSE 2-4 GM/100ML-% IV SOLN
INTRAVENOUS | Status: AC
  Filled 2024-05-25: qty 100

## 2024-05-25 MED ORDER — SODIUM CHLORIDE 0.9 % IV SOLN
80.0000 mg | INTRAVENOUS | Status: AC
  Administered 2024-05-25: 80 mg

## 2024-05-25 MED ORDER — LIDOCAINE HCL (PF) 1 % IJ SOLN
INTRAMUSCULAR | Status: DC | PRN
  Administered 2024-05-25: 60 mL

## 2024-05-25 MED ORDER — ONDANSETRON HCL 4 MG/2ML IJ SOLN: 4.0000 mg | Freq: Four times a day (QID) | INTRAMUSCULAR | Status: DC | PRN

## 2024-05-25 MED ORDER — POVIDONE-IODINE 10 % EX SWAB: 2.0000 | Freq: Once | CUTANEOUS | Status: DC

## 2024-05-25 MED ORDER — MIDAZOLAM HCL 2 MG/2ML IJ SOLN
INTRAMUSCULAR | Status: AC
  Filled 2024-05-25: qty 2

## 2024-05-25 MED ORDER — LIDOCAINE HCL (PF) 1 % IJ SOLN
INTRAMUSCULAR | Status: AC
  Filled 2024-05-25: qty 60

## 2024-05-25 MED ORDER — ACETAMINOPHEN 325 MG PO TABS: 325.0000 mg | ORAL_TABLET | ORAL | Status: DC | PRN

## 2024-05-25 MED ORDER — FENTANYL CITRATE (PF) 100 MCG/2ML IJ SOLN
INTRAMUSCULAR | Status: DC | PRN
  Administered 2024-05-25: 50 ug via INTRAVENOUS
  Administered 2024-05-25 (×2): 25 ug via INTRAVENOUS

## 2024-05-25 MED ORDER — FENTANYL CITRATE (PF) 100 MCG/2ML IJ SOLN
INTRAMUSCULAR | Status: AC
  Filled 2024-05-25: qty 2

## 2024-05-25 MED ORDER — MIDAZOLAM HCL 5 MG/5ML IJ SOLN
INTRAMUSCULAR | Status: DC | PRN
  Administered 2024-05-25: 1 mg via INTRAVENOUS
  Administered 2024-05-25 (×2): .5 mg via INTRAVENOUS

## 2024-05-25 NOTE — H&P (Signed)
 "  Date: 05/25/24 ID:  [name], DOB Nov 23, 1970, MRN [id number]   Primary Cardiologist: Dorn Lesches, MD Electrophysiologist: Fonda Kitty, MD      History of Present Illness:   [name] is a 54 y.o. female with h/o tobacco abuse, diabetes, hypertension, hyperlipidemia, CAD s/p RCA PCI and 3v-CABG in 2016 and chronic systolic heart failure who is being seen today for ICD evaluation at the request of Dr. Lesches.   Discussed the use of AI scribe software for clinical note transcription with the patient, who gave verbal consent to proceed.   History of Present Illness [name] is a 54 year old female with coronary artery disease and reduced ejection fraction who presents for evaluation of a defibrillator. She was referred by Dr. Lesches for evaluation of a defibrillator due to reduced heart pumping function.   She has significant blockages in her coronary arteries, and a heart catheterization revealed no suitable targets for revascularization. Her ejection fraction has decreased to below 35%, which increases her risk for sudden cardiac death due to ventricular arrhythmias.   She has not experienced any known dangerous heart rhythms. She is considering the option of a defibrillator as a preventive measure. She is contemplating the timing of the procedure and prefers to wait until the beginning of the next year to make a decision.   Her family history is significant for cardiovascular disease, with her father having passed away from a stroke and heart attack. No family members have had a defibrillator.   She works as a conservation officer, nature at [employer], which occasionally requires lifting items like soda packs. She recalls having restrictions on lifting after a previous procedure and anticipates similar restrictions if she proceeds with the defibrillator implantation.     Review of systems complete and found to be negative unless listed in HPI.    EP Information / Studies  Reviewed:     EKG is ordered today. Personal review as below.   EKG Interpretation Date/Time:                  Tuesday March 14 2024 11:05:13 EST Ventricular Rate:         76 PR Interval:                 154 QRS Duration:             106 QT Interval:                 390 QTC Calculation:438 R Axis:                         108   Text Interpretation:Normal sinus rhythm Rightward axis ST & T wave abnormality in the inferior and lateral leads. When compared with ECG of 21-Jan-2024 08:26,ST-T changes in the inferolateral leads. Confirmed by Kitty Fonda [phone]) on 03/16/2024 2:19:47 PM    EKG 01/21/24: SR with PR , QRS .    LHC 01/31/24:  Angiographic data: LM: Diffusely diseased with 20 to 30% stenosis. LAD: Severely diffusely diseased.  Ostial/proximal 90% stenosis followed by occlusion after D2.  Distal LAD supplied by LIMA.  Due to is also moderately diseased. LCx: Occluded in the proximal segment.  Faint minimal collaterals are evident from LAD to Cx. RCA: Prior proximal RCA stent severe restenosis of 99% followed by occlusion at the second stent.  Minimal faint collaterals evident from LAD to distal RCA. SVG to PDA and SVG  to OM1 are occluded.  LIMA to severely diseased LAD is patent.   Cardiac PET 01/12/24: Findings are consistent with ischemia and infarction. The study is high risk. In addition to areas of scar in the inferior and lateral wall, there is a large area of anterior ischemia and limited peri-infarct lateral wall ischemia. With stress, anterior wall motion worsens and LVEF deteriorates further. Recommend cardiac catheterization.    Echo 09/17/23:  1. Left ventricular ejection fraction, by estimation, is 30%. Left  ventricular ejection fraction by 3D volume is 29 %. The left ventricle has  severely decreased function. The left ventricle demonstrates regional wall  motion abnormalities (see scoring  diagram/findings for description). Left ventricular diastolic  parameters  are consistent with Grade I diastolic dysfunction (impaired relaxation).  No LV thrombus.   2. Right ventricular systolic function is normal. The right ventricular  size is normal. There is normal pulmonary artery systolic pressure.   3. Diminished left atrial reservoir strain; 13.8%.   4. The mitral valve is normal in structure. Trivial mitral valve  regurgitation. No evidence of mitral stenosis. The mean mitral valve  gradient is 1.3 mmHg with average heart rate of 76 bpm.   5. The aortic valve is tricuspid. Aortic valve regurgitation is not  visualized. No aortic stenosis is present.   6. The inferior vena cava is normal in size with greater than 50%  respiratory variability, suggesting right atrial pressure of 3 mmHg.    Physical Exam:   VS:  BP 122/74   Pulse 76   Ht 5' 3 (1.6 m)   Wt 144 lb (65.3 kg)   BMI 25.51 kg/m       Wt Readings from Last 3 Encounters:  03/14/24 144 lb (65.3 kg)  02/14/24 142 lb (64.4 kg)  01/31/24 140 lb (63.5 kg)      GEN: Well nourished, well developed in no acute distress NECK: No JVD CARDIAC: Normal rate, regular rhythm RESPIRATORY:  Clear to auscultation without rales, wheezing or rhonchi  ABDOMEN: Soft, non-distended EXTREMITIES:  No edema; No deformity    ASSESSMENT AND PLAN:     #Chronic systolic heart failure:  #Ischemic cardiomyopathy: - Patient meets criteria for primary prevention ICD in the setting of ischemic cardiomyopathy and chronic systolic heart failure with LVEF less than 35% despite greater than 3 months of optimal medical therapy. Explained risks, benefits, and alternatives to ICD implantation, including but not limited to bleeding, infection, damage to heart or lungs, heart attack, stroke, or death.  Pt verbalized understanding and proceed.  QRS is narrow.  We discussed both transvenous and subcutaneous devices.  Given her her degree of scar, likelihood of scar mediated VT at some point is high.  Transvenous  device would offer ATP therapies.  For now, we are leaning towards a single-chamber transvenous ICD. - Continue GDMT regimen of carvedilol , dapagliflozin , Entresto , spironolactone .  Close follow-up with primary cardiologist, Dr. Court.    "

## 2024-05-25 NOTE — Interval H&P Note (Signed)
 History and Physical Interval Note:  05/25/2024 3:41 PM  [name]  has presented today for surgery, with the diagnosis of heart failure.  The various methods of treatment have been discussed with the patient and family. After consideration of risks, benefits and other options for treatment, the patient has consented to  Procedures: ICD IMPLANT (N/A) as a surgical intervention.  The patient's history has been reviewed, patient examined, no change in status, stable for surgery.  I have reviewed the patient's chart and labs.  Questions were answered to the patient's satisfaction.     [name]

## 2024-05-25 NOTE — Discharge Instructions (Signed)
 After Your ICD (Implantable Cardiac Defibrillator)   You have a [employer] ICD  If you have a Medtronic or Biotronik device, plug in your home monitor once you get home, and no manual interaction is required.   If you have an [employer] or [employer]one device, plug your home monitor once you get home, sit near the device, and press the large activation button. Sit nearby until the process is complete, usually notated by lights on the monitor.   If you were set up for monitoring using an app on your phone, make sure the app remains open in the background and the Bluetooth remains on.  ACTIVITY Do not lift your arm above shoulder height for 1 week after your procedure. After 7 days, you may progress as below.  You should remove your sling 24 hours after your procedure, unless otherwise instructed by your provider.     Thursday June 01, 2024  Friday June 02, 2024 Saturday June 03, 2024 Sunday June 04, 2024   Do not lift, push, pull, or carry anything over 10 pounds with the affected arm until 6 weeks (Thursday July 06, 2024 ) after your procedure.   You may drive AFTER your wound check, UNLESS you have been told otherwise by your provider.   Ask your healthcare provider when you can go back to work   INCISION/Dressing If you are on a blood thinner such as Coumadin, Xarelto, Eliquis , Plavix, or Pradaxa please confirm with your provider when this should be resumed.   If large square, outer bandage is left in place, this can be removed after 24 hours from your procedure. Do not remove steri-strips or glue as below.   Monitor your defibrillator site for redness, swelling, and drainage. Call the device clinic at [phone] if you experience these symptoms or fever/chills.    If your incision is sealed with Steri-strips or staples, you may shower 7 days after your procedure or when told by your provider. Do not remove the steri-strips or let the shower hit directly  on your site. You may wash around your site with soap and water .    If you were discharged in a sling, please do not wear this during the day more than 48 hours after your surgery unless otherwise instructed. This may increase the risk of stiffness and soreness in your shoulder.   Avoid lotions, ointments, or perfumes over your incision until it is well-healed.  You may use a hot tub or a pool AFTER your wound check appointment if the incision is completely closed.  Your ICD is designed to protect you from life threatening heart rhythms. Because of this, you may receive a shock.   1 shock with no symptoms:  Call the office during business hours. 1 shock with symptoms (chest pain, chest pressure, dizziness, lightheadedness, shortness of breath, overall feeling unwell):  Call 911. If you experience 2 or more shocks in 24 hours:  Call 911. If you receive a shock, you should not drive for 6 months per the [place] DMV IF you receive appropriate therapy from your ICD.   ICD Alerts:  Some alerts are vibratory and others beep. These are NOT emergencies. Please call our office to let us  know. If this occurs at night or on weekends, it can wait until the next business day. Send a remote transmission.  If your device is capable of reading fluid status (for heart failure), you will be offered monthly monitoring to review this with you.   DEVICE  MANAGEMENT Remote monitoring is used to monitor your ICD from home. This monitoring is scheduled every 91 days by our office. It allows us  to keep an eye on the functioning of your device to ensure it is working properly. You will routinely see your Electrophysiologist annually (more often if necessary). This will appear as a REMOTE check on your MyChart schedule. These are automatic and there is nothing for you to manually do unless otherwise instructed.  You should receive your ID card for your new device in 4-8 weeks. Keep this card with you at all times once  received. Consider wearing a medical alert bracelet or necklace.  Your ICD  may be MRI compatible. This will be discussed at your next office visit/wound check.  You should avoid contact with strong electric or magnetic fields.   Do not use amateur (ham) radio equipment or electric (arc) welding torches. MP3 player headphones with magnets should not be used. Some devices are safe to use if held at least 12 inches (30 cm) from your defibrillator. These include power tools, lawn mowers, and speakers. If you are unsure if something is safe to use, ask your health care provider.  When using your cell phone, hold it to the ear that is on the opposite side from the defibrillator. Do not leave your cell phone in a pocket over the defibrillator.  You may safely use electric blankets, heating pads, computers, and microwave ovens.  Call the office right away if: You have chest pain. You feel more than one shock. You feel more short of breath than you have felt before. You feel more light-headed than you have felt before. Your incision starts to open up.  This information is not intended to replace advice given to you by your health care provider. Make sure you discuss any questions you have with your health care provider.

## 2024-05-25 NOTE — Progress Notes (Addendum)
 Patient and patient son given discharge instructions, education provided no further questions at this time. Able to tolerate PO intake. Patient site is clean, dry, intact with no hematoma noted upon discharge. Verified when to resume aspirin  with MD, seen by device rep, x-ray reviewed by MD Kennyth, per MD okay to d/c at this point. Also verified with Rep about previous run PVC, rep states ICD was running an auto-test at this time, no issues.

## 2024-05-26 ENCOUNTER — Telehealth: Payer: Self-pay

## 2024-05-26 MED FILL — Midazolam HCl Inj 2 MG/2ML (Base Equivalent): INTRAMUSCULAR | Qty: 2 | Status: AC

## 2024-05-26 NOTE — Telephone Encounter (Signed)
 Follow-up after same day discharge: Implant date: 05/25/2024 MD: JP Device: ICD Location: L chest    Wound check visit: 06/06/2024  90 day MD follow-up: 08/24/2024  Remote Transmission received:yes  Dressing/sling removed: pt will still use sling when she sleeps it makes her more comfortable so she doesn't hit her device on accident   Confirm OAC restart on: yes   Please continue to monitor your cardiac device site for redness, swelling, and drainage. Call the device clinic at [phone] if you experience these symptoms, fever/chills, or have questions about your device.   Remote monitoring is used to monitor your cardiac device from home. This monitoring is scheduled every 91 days by our office. It allows us  to keep an eye on the functioning of your device to ensure it is working properly.

## 2024-06-01 ENCOUNTER — Telehealth: Payer: Self-pay | Admitting: Cardiovascular Disease

## 2024-06-01 NOTE — Telephone Encounter (Signed)
 Called to discuss issue with patient  No answer  Left detailed message on voicemail box stating that some pain after implant is to be expected and that she can manage with Tylenol  as directed on the bottle for pain along with heat applied to her shoulder but not directly over the incision site  Patient instructed to continue watching for s/s of bleeding and infection until 2 week post op visit next Tuesday    Device clinic number provided as well incase patient has further concerns

## 2024-06-01 NOTE — Telephone Encounter (Signed)
 Pt would like a c/b regarding having shoulder pain in left arm. Pt would like to know if this is normal due to her procedure on 1/15 .Please advise

## 2024-06-05 ENCOUNTER — Ambulatory Visit (HOSPITAL_COMMUNITY)

## 2024-06-06 ENCOUNTER — Encounter: Payer: Self-pay | Admitting: *Deleted

## 2024-06-06 ENCOUNTER — Ambulatory Visit: Admitting: *Deleted

## 2024-06-06 DIAGNOSIS — I5020 Unspecified systolic (congestive) heart failure: Secondary | ICD-10-CM | POA: Diagnosis not present

## 2024-06-06 LAB — CUP PACEART INCLINIC DEVICE CHECK
Date Time Interrogation Session: 20260127140121
HighPow Impedance: 51 Ohm
Implantable Lead Connection Status: 753985
Implantable Lead Implant Date: 20260115
Implantable Lead Location: 753860
Implantable Lead Model: 672
Implantable Lead Serial Number: 305102
Implantable Pulse Generator Implant Date: 20260115
Lead Channel Impedance Value: 420 Ohm
Lead Channel Pacing Threshold Amplitude: 0.4 V
Lead Channel Pacing Threshold Pulse Width: 0.4 ms
Lead Channel Sensing Intrinsic Amplitude: 17.6 mV
Lead Channel Setting Pacing Amplitude: 3.5 V
Lead Channel Setting Pacing Pulse Width: 0.4 ms
Lead Channel Setting Sensing Sensitivity: 0.5 mV
Pulse Gen Serial Number: 361967
Zone Setting Status: 755011

## 2024-06-06 NOTE — Patient Instructions (Signed)

## 2024-06-06 NOTE — Progress Notes (Addendum)
 Normal single chamber ICD wound check. Wound well healed minus one small area on left lateral side of incision near patient's armpit. No drainage noted. Picture taken incase any worsening occurs. Patient well educated on s/s to watch for regarding incision site over coming weeks and patient verbalized understanding. Presenting rhythm: VS. Routine testing performed. Thresholds, sensing, and impedance consistent with implant measurements with 3.5V safety margin/auto capture until 3 month visit. No treated arrhythmias. Reviewed arm restrictions to continue for 6 weeks total post op. Reviewed shock plan.  Pt enrolled in remote follow-up.

## 2024-06-07 ENCOUNTER — Other Ambulatory Visit: Payer: Self-pay

## 2024-06-08 ENCOUNTER — Other Ambulatory Visit: Payer: Self-pay

## 2024-06-08 ENCOUNTER — Telehealth: Payer: Self-pay

## 2024-06-08 NOTE — Telephone Encounter (Signed)
 Pt called in stating that she has been trying to find out if we got paperwork for her to be able to go back to work. Pt states she has let her HR dept know all of our info and out number and she has not heard back. Pt would like a call back

## 2024-06-11 ENCOUNTER — Ambulatory Visit: Payer: Self-pay | Admitting: Cardiology

## 2024-06-12 DIAGNOSIS — Z0279 Encounter for issue of other medical certificate: Secondary | ICD-10-CM

## 2024-06-13 ENCOUNTER — Other Ambulatory Visit: Payer: Self-pay

## 2024-06-13 ENCOUNTER — Telehealth: Payer: Self-pay | Admitting: Cardiovascular Disease

## 2024-06-13 MED ORDER — NITROGLYCERIN 0.4 MG SL SUBL: 0.4000 mg | SUBLINGUAL_TABLET | SUBLINGUAL | 10 refills | Status: AC | PRN

## 2024-06-13 NOTE — Telephone Encounter (Signed)
 Refill sent

## 2024-06-14 ENCOUNTER — Other Ambulatory Visit: Payer: Self-pay

## 2024-06-14 NOTE — Telephone Encounter (Signed)
Paperwork has been received. 

## 2024-06-15 ENCOUNTER — Other Ambulatory Visit: Payer: Self-pay

## 2024-06-15 MED ORDER — NITROGLYCERIN 0.4 MG SL SUBL
0.4000 mg | SUBLINGUAL_TABLET | SUBLINGUAL | 11 refills | Status: AC | PRN
  Filled 2024-06-15: qty 25, 1d supply, fill #0

## 2024-06-15 NOTE — Telephone Encounter (Signed)
 Completed FMLA form was faxed to [employer] by Doreatha, RN and scanned into patient's chart.  Patient and billing notified.

## 2024-06-22 ENCOUNTER — Ambulatory Visit: Admitting: Allergy

## 2024-07-06 ENCOUNTER — Ambulatory Visit

## 2024-08-04 ENCOUNTER — Ambulatory Visit: Payer: Self-pay | Admitting: Nurse Practitioner

## 2024-08-24 ENCOUNTER — Ambulatory Visit: Admitting: Physician Assistant

## 2024-10-05 ENCOUNTER — Ambulatory Visit

## 2025-01-03 ENCOUNTER — Ambulatory Visit: Admitting: Physician Assistant

## 2025-01-04 ENCOUNTER — Ambulatory Visit

## 2025-04-05 ENCOUNTER — Ambulatory Visit

## 2025-07-05 ENCOUNTER — Ambulatory Visit
